# Patient Record
Sex: Male | Born: 1960 | Hispanic: No | Smoking: Former smoker
Health system: Southern US, Community
[De-identification: ages and names within clinical notes are randomized; demographics above are authoritative.]

## PROBLEM LIST (undated history)

## (undated) DIAGNOSIS — M109 Gout, unspecified: Secondary | ICD-10-CM

## (undated) DIAGNOSIS — I10 Essential (primary) hypertension: Secondary | ICD-10-CM

## (undated) DIAGNOSIS — F329 Major depressive disorder, single episode, unspecified: Secondary | ICD-10-CM

## (undated) DIAGNOSIS — C801 Malignant (primary) neoplasm, unspecified: Secondary | ICD-10-CM

## (undated) DIAGNOSIS — J449 Chronic obstructive pulmonary disease, unspecified: Secondary | ICD-10-CM

## (undated) DIAGNOSIS — E785 Hyperlipidemia, unspecified: Secondary | ICD-10-CM

## (undated) DIAGNOSIS — E538 Deficiency of other specified B group vitamins: Secondary | ICD-10-CM

## (undated) DIAGNOSIS — J45909 Unspecified asthma, uncomplicated: Secondary | ICD-10-CM

## (undated) DIAGNOSIS — D509 Iron deficiency anemia, unspecified: Secondary | ICD-10-CM

## (undated) DIAGNOSIS — F32A Depression, unspecified: Secondary | ICD-10-CM

## (undated) DIAGNOSIS — G2581 Restless legs syndrome: Secondary | ICD-10-CM

## (undated) DIAGNOSIS — R0600 Dyspnea, unspecified: Secondary | ICD-10-CM

## (undated) DIAGNOSIS — D649 Anemia, unspecified: Secondary | ICD-10-CM

## (undated) DIAGNOSIS — E1140 Type 2 diabetes mellitus with diabetic neuropathy, unspecified: Secondary | ICD-10-CM

## (undated) DIAGNOSIS — M1990 Unspecified osteoarthritis, unspecified site: Secondary | ICD-10-CM

## (undated) DIAGNOSIS — I998 Other disorder of circulatory system: Secondary | ICD-10-CM

## (undated) HISTORY — PX: NOSE SURGERY: SHX723

## (undated) HISTORY — PX: ANKLE FRACTURE SURGERY: SHX122

## (undated) HISTORY — DX: Restless legs syndrome: G25.81

## (undated) HISTORY — DX: Chronic obstructive pulmonary disease, unspecified: J44.9

## (undated) HISTORY — DX: Deficiency of other specified B group vitamins: E53.8

## (undated) HISTORY — DX: Gout, unspecified: M10.9

## (undated) HISTORY — DX: Major depressive disorder, single episode, unspecified: F32.9

## (undated) HISTORY — DX: Hyperlipidemia, unspecified: E78.5

## (undated) HISTORY — PX: KNEE SURGERY: SHX244

## (undated) HISTORY — DX: Unspecified osteoarthritis, unspecified site: M19.90

## (undated) HISTORY — DX: Depression, unspecified: F32.A

## (undated) HISTORY — DX: Type 2 diabetes mellitus with diabetic neuropathy, unspecified: E11.40

## (undated) HISTORY — DX: Other disorder of circulatory system: I99.8

## (undated) HISTORY — PX: COLONOSCOPY: SHX174

## (undated) HISTORY — PX: UPPER GASTROINTESTINAL ENDOSCOPY: SHX188

## (undated) HISTORY — DX: Essential (primary) hypertension: I10

## (undated) HISTORY — DX: Iron deficiency anemia, unspecified: D50.9

## (undated) HISTORY — DX: Unspecified asthma, uncomplicated: J45.909

---

## 2005-09-24 HISTORY — PX: TONGUE SURGERY: SHX810

## 2006-09-24 HISTORY — PX: CIRCUMCISION: SUR203

## 2010-02-14 ENCOUNTER — Encounter: Payer: Self-pay | Admitting: Cardiovascular Disease

## 2010-02-17 ENCOUNTER — Encounter: Payer: Self-pay | Admitting: Cardiovascular Disease

## 2010-03-03 ENCOUNTER — Ambulatory Visit: Payer: Self-pay | Admitting: Cardiovascular Disease

## 2010-03-03 DIAGNOSIS — I10 Essential (primary) hypertension: Secondary | ICD-10-CM

## 2010-03-03 DIAGNOSIS — R0602 Shortness of breath: Secondary | ICD-10-CM | POA: Insufficient documentation

## 2010-03-03 DIAGNOSIS — I498 Other specified cardiac arrhythmias: Secondary | ICD-10-CM

## 2010-03-21 ENCOUNTER — Encounter: Payer: Self-pay | Admitting: Cardiovascular Disease

## 2010-04-06 ENCOUNTER — Telehealth (INDEPENDENT_AMBULATORY_CARE_PROVIDER_SITE_OTHER): Payer: Self-pay | Admitting: *Deleted

## 2010-10-24 NOTE — Progress Notes (Signed)
  Phone Note Outgoing Call   Call placed by: Scherrie Bateman, LPN,  April 06, 2010 9:21 AM Call placed to: Patient Action Taken: Appt scheduled Summary of Call: LEFT MESSAGE PT NEEDS STRESS ECHO RESCHEDULED IF WILLING TO HAVE DONE PREVIOUS CANCELLED. Initial call taken by: Scherrie Bateman, LPN,  April 06, 2010 9:22 AM  Follow-up for Phone Call        SPOKE WITH PT STATES DID NOT THINK TEST WAS ALL THAT NECESSARY WILL CALL BACK IF DECIDES TO RESCHEDULE. Follow-up by: Scherrie Bateman, LPN,  April 06, 2010 9:47 AM

## 2010-10-24 NOTE — Letter (Signed)
Summary: [facility] [facility]   [employer] [facility]   Imported By: Roderic Ovens 04/27/2010 15:20:56  _____________________________________________________________________  External Attachment:    Type:   Image     Comment:   External Document

## 2010-10-24 NOTE — Consult Note (Signed)
Summary: [facility]   Imported By: Marylou Mccoy 03/02/2010 16:32:24  _____________________________________________________________________  External Attachment:    Type:   Image     Comment:   External Document

## 2010-10-24 NOTE — Consult Note (Signed)
Summary: [facility]   Imported By: Marylou Mccoy 03/02/2010 16:19:39  _____________________________________________________________________  External Attachment:    Type:   Image     Comment:   External Document

## 2010-10-24 NOTE — Assessment & Plan Note (Signed)
Summary: NP3/HTN/DM/JML   CC:  new patient with HTN and DM. Marland Kitchen  History of Present Illness: Robert Beck is seen at the request of Dr Timothy Lasso for multiple CRF's dyspnea and tachycardia.  He has been an insulin dependant diabetic for about 6 years.  He had a normal stress test in [place] about 5 years ago.  He denies SSCP but does get exertional dyspnea at times.  He is compliant with his meds and is on an ARB for renal protection and HTN.  He is not on a stain and I dont have an LDL on him  I reviewed ECG from 02/14/10 which was normal.  HR in our office after being roomed was 100bmp.  He was unaware of relatively high rate. Had bad chest pain about a month ago which likely was indigestion.  Has not recurred.  Current Problems (verified): 1)  Shortness of Breath  (ICD-786.05)  Current Medications (verified): 1)  Benicar Hct 40-12.5 Mg Tabs (Olmesartan Medoxomil-Hctz) .... Take One Tablet Once Daily 2)  Glyburide-Metformin 5-500 Mg Tabs (Glyburide-Metformin) .... Take One Tablet Three Times A Day 3)  Levemir Flexpen 100 Unit/ml Soln (Insulin Detemir) .... 20 Units Once Daily 4)  Fish Oil 1000 Mg Caps (Omega-3 Fatty Acids) .... Once Capsule Two Times A Day 5)  Multivitamins  Tabs (Multiple Vitamin) .... Take One Tablet Once Daily 6)  Slow Release Iron 47.5 Mg Cr-Tabs (Ferrous Sulfate) .... As Needed 7)  Aspirin 81 Mg Tabs (Aspirin) .... Take One Tablet Once Daily 8)  Ropinirole Hcl 1 Mg Tabs (Ropinirole Hcl) .... Take One Tablet At Bedtime  Allergies (verified): No Known Drug Allergies  Past History:  Past Medical History: Last updated: 03/21/10 HTN DM RLS Asthma Osteoarthritis  Past Surgical History: Last updated: 21-Mar-2010  Exploration and excisional biopsy of left thumb mass.   Growth Cut off tongue  Kneesurgery for torn Meniscus-Dr.Winkstrom  Nose  Family History: Last updated: 2010-03-21 Mother died leukemia  Dad died 12 of CAD  Social History: Last updated:  03/21/2010 Self Emplyed [employer] Single  Tobacco Use - Former.  Alcohol Use - yes  Review of Systems       Denies fever, malais, weight loss, blurry vision, decreased visual acuity, cough, sputum, , hemoptysis, pleuritic pain, palpitaitons, heartburn, abdominal pain, melena, lower extremity edema, claudication, or rash.   Vital Signs:  Patient profile:   50 year old male Height:      65 inches Weight:      187 pounds BMI:     31.23 Pulse rate:   94 / minute Pulse rhythm:   regular BP sitting:   118 / 90  (left arm) Cuff size:   large  Vitals Entered By: Judithe Modest CMA (March 03, 2010 11:40 AM)  Physical Exam  General:  Affect appropriate Healthy:  appears stated age HEENT: normal Neck supple with no adenopathy JVP normal no bruits no thyromegaly Lungs clear with no wheezing and good diaphragmatic motion Heart:  S1/S2 no murmur,rub, gallop or click PMI normal Abdomen: benighn, BS positve, no tenderness, no AAA no bruit.  No HSM or HJR Distal pulses intact with no bruits No edema Neuro non-focal Skin warm and dry    Impression & Recommendations:  Problem # 1:  SHORTNESS OF BREATH (ICD-786.05) Functional with normal exam  Stess echo to R/O anginal equivalent given multiple risk factors His updated medication list for this problem includes:    Benicar Hct 40-12.5 Mg Tabs (Olmesartan medoxomil-hctz) .Marland Kitchen... Take one tablet once  daily    Aspirin 81 Mg Tabs (Aspirin) .Marland Kitchen... Take one tablet once daily  Orders: Stress Echo (Stress Echo)  Problem # 2:  ESSENTIAL HYPERTENSION, BENIGN (ICD-401.1) Well controlled with see hemodynamic response to exercise duriing stress echo His updated medication list for this problem includes:    Benicar Hct 40-12.5 Mg Tabs (Olmesartan medoxomil-hctz) .Marland Kitchen... Take one tablet once daily    Aspirin 81 Mg Tabs (Aspirin) .Marland Kitchen... Take one tablet once daily  Problem # 3:  FAMILY HISTORY OF ISCHEMIC HEART DISEASE (ICD-V17.3) Especially  with poorly controlled DM will need stress testing q 3 years.  Consdier calcium score and F/U lipid profile to see if statin should be prescribed  Problem # 4:  SINUS TACHYCARDIA (ICD-427.89) May be anxiety or autonomic dysfunction.  EF will be seen with stress echo and will make sure HR response is normal His updated medication list for this problem includes:    Aspirin 81 Mg Tabs (Aspirin) .Marland Kitchen... Take one tablet once daily  Patient Instructions: 1)  Your physician recommends that you schedule a follow-up appointment in: AS NEEDED 2)  Your physician recommends that you continue on your current medications as directed. Please refer to the Current Medication list given to you today. 3)  Your physician has requested that you have a stress echocardiogram. For further information please visit [email or web address]  Please follow instruction sheet as given.   EKG Report  Procedure date:  02/14/2010  Findings:      NSR  Normal ECG

## 2011-02-15 ENCOUNTER — Telehealth: Payer: Self-pay | Admitting: Cardiovascular Disease

## 2011-02-15 NOTE — Telephone Encounter (Signed)
Called patient's wife. She wanted to know what tests he had done last year when he saw Dr.Nishan. I advised her that he had an EKG completed.

## 2011-02-15 NOTE — Telephone Encounter (Signed)
Attempted to call pt. X 2  Phone sounds busy.

## 2011-02-15 NOTE — Telephone Encounter (Signed)
Pt was in 03-03-10 to see dr Eden Emms and wants to know what was done during that visit

## 2011-02-16 ENCOUNTER — Telehealth: Payer: Self-pay | Admitting: Cardiovascular Disease

## 2011-02-16 NOTE — Telephone Encounter (Signed)
Patient last saw Dr. Eden Emms in 02/2010. Stress echo scheduled and cancelled per pt. He will need to set up an appointment to see Dr. Eden Emms.

## 2011-02-16 NOTE — Telephone Encounter (Signed)
Pt need to be set up stress ultrasound.

## 2011-02-21 NOTE — Telephone Encounter (Signed)
Pt wants to schedule an stress echo. Please put in a order for the stress echo. If you would like once the orders in i would be able to call pt and set the appt up for the pt.

## 2011-02-23 ENCOUNTER — Other Ambulatory Visit: Payer: Self-pay | Admitting: *Deleted

## 2011-02-23 NOTE — Telephone Encounter (Signed)
PT'S WIFE AWARE OKAY FOR PT TO HAVE  STRESS ECHO DONE PER DR NISHAN WILL FORWARD TO SCHEDULERS TO SET UP .Robert Beck

## 2011-02-23 NOTE — Telephone Encounter (Signed)
Pt rtn your call you can rtn call at [phone]

## 2011-02-27 ENCOUNTER — Encounter: Payer: Self-pay | Admitting: Cardiovascular Disease

## 2011-02-27 ENCOUNTER — Encounter: Payer: Self-pay | Admitting: *Deleted

## 2011-02-27 ENCOUNTER — Telehealth: Payer: Self-pay | Admitting: *Deleted

## 2011-02-27 NOTE — Telephone Encounter (Signed)
ERROR

## 2011-03-07 ENCOUNTER — Ambulatory Visit (HOSPITAL_BASED_OUTPATIENT_CLINIC_OR_DEPARTMENT_OTHER): Payer: BC Managed Care – PPO | Admitting: Radiology

## 2011-03-07 ENCOUNTER — Ambulatory Visit (HOSPITAL_COMMUNITY): Payer: BC Managed Care – PPO | Attending: Cardiovascular Disease | Admitting: Radiology

## 2011-03-07 ENCOUNTER — Telehealth: Payer: Self-pay | Admitting: Cardiovascular Disease

## 2011-03-07 DIAGNOSIS — R072 Precordial pain: Secondary | ICD-10-CM

## 2011-03-07 DIAGNOSIS — R0989 Other specified symptoms and signs involving the circulatory and respiratory systems: Secondary | ICD-10-CM

## 2011-03-07 DIAGNOSIS — R079 Chest pain, unspecified: Secondary | ICD-10-CM | POA: Insufficient documentation

## 2011-03-07 NOTE — Telephone Encounter (Signed)
Spoke with Britta Mccreedy, she is aware stress test is normal. Deliah Goody

## 2011-03-07 NOTE — Telephone Encounter (Signed)
Pt had stress echo done today c/o chestpain , sob,

## 2014-03-23 ENCOUNTER — Encounter: Payer: Self-pay | Admitting: Cardiovascular Disease

## 2016-01-17 ENCOUNTER — Other Ambulatory Visit: Payer: Self-pay | Admitting: Internal Medicine

## 2016-01-17 DIAGNOSIS — R109 Unspecified abdominal pain: Secondary | ICD-10-CM

## 2016-01-17 DIAGNOSIS — R112 Nausea with vomiting, unspecified: Secondary | ICD-10-CM

## 2016-02-27 ENCOUNTER — Telehealth: Payer: Self-pay | Admitting: Gastroenterology

## 2016-02-27 NOTE — Telephone Encounter (Signed)
Patient scheduled for 03/01/16 with Nicoletta Ba PA Left message for patient to call back

## 2016-02-27 NOTE — Telephone Encounter (Signed)
Dr. Virgina Jock called about a patient who has never been seen here;, new significant IDA Hb 9.7 (was 13-15).  Heme positive. No overt GI bleeding. No NSAIDs except bASA which as been stopped.  Omep 20mg  daily started.  Can you put her in for NGI OV this week, looks like AE has times on Thursday.

## 2016-02-28 NOTE — Telephone Encounter (Signed)
I left a detailed message for the patient that we have scheduled her for an office visit for Thursday at 11:00 with [facility] PA.  I asked that she call back and confirm she got me message and let me know if she can keep appt

## 2016-02-28 NOTE — Telephone Encounter (Signed)
Patient rescheduled to Friday at 2:30 with Alonza Bogus, PA.  She can't come on Thursday

## 2016-03-01 ENCOUNTER — Ambulatory Visit: Payer: Self-pay | Admitting: Physician Assistant

## 2016-03-02 ENCOUNTER — Ambulatory Visit (INDEPENDENT_AMBULATORY_CARE_PROVIDER_SITE_OTHER): Payer: BLUE CROSS/BLUE SHIELD | Admitting: Gastroenterology

## 2016-03-02 ENCOUNTER — Encounter: Payer: Self-pay | Admitting: Gastroenterology

## 2016-03-02 VITALS — BP 118/66 | HR 84 | Ht 66.0 in | Wt 165.0 lb

## 2016-03-02 DIAGNOSIS — Z1211 Encounter for screening for malignant neoplasm of colon: Secondary | ICD-10-CM | POA: Insufficient documentation

## 2016-03-02 DIAGNOSIS — IMO0001 Reserved for inherently not codable concepts without codable children: Secondary | ICD-10-CM

## 2016-03-02 DIAGNOSIS — R634 Abnormal weight loss: Secondary | ICD-10-CM | POA: Insufficient documentation

## 2016-03-02 DIAGNOSIS — Z794 Long term (current) use of insulin: Secondary | ICD-10-CM

## 2016-03-02 DIAGNOSIS — R195 Other fecal abnormalities: Secondary | ICD-10-CM | POA: Insufficient documentation

## 2016-03-02 DIAGNOSIS — E119 Type 2 diabetes mellitus without complications: Secondary | ICD-10-CM

## 2016-03-02 DIAGNOSIS — D509 Iron deficiency anemia, unspecified: Secondary | ICD-10-CM

## 2016-03-02 MED ORDER — NA SULFATE-K SULFATE-MG SULF 17.5-3.13-1.6 GM/177ML PO SOLN: 1.0000 | Freq: Once | ORAL | Status: AC

## 2016-03-02 NOTE — Progress Notes (Signed)
03/02/2016 Robert Beck [id number] [birth year]   HISTORY OF PRESENT ILLNESS:  This is a 55 year old white male who is new to our office and was referred by his PCP, Dr. Virgina Jock for new iron deficiency anemia. His most recent hemoglobin was 10.4 g as compared to 13-15 g that was reportedly drawn several months ago. Iron levels are also low with a ferritin of 6, serum iron of 17, iron percent saturation 4.  He was also heme positive.  He denies any red blood in his stool, but says that they have been black and tarry on occasion, the last occurring a week or 2 ago. He denies NSAID use. He was on an aspirin, but that was recently discontinued by his PCP in light of these issues.  He admits to having extreme fatigue that has been going on for a while. Reports weight loss of possibly up to 30 pounds over several months.  Reports some nausea, but no vomiting. Recently started taking iron supplement once daily.  Past Medical History  Diagnosis Date  . Hypertension   . Type 2 diabetes, controlled, with neuropathy ([place])   . Asthma   . Hyperlipidemia   . RLS (restless legs syndrome)   . Gout   . Iron deficiency anemia   . Ischemia   . Depression   . Vitamin B12 deficiency   . Osteoarthritis    Past Surgical History  Procedure Laterality Date  . Knee surgery    . Ankle fracture surgery    . Nose surgery    . Circumcision  2008    reports that he has quit smoking. He does not have any smokeless tobacco history on file. He reports that he drinks alcohol. He reports that he does not use illicit drugs. family history includes Breast cancer in his mother; Heart disease in his father; Leukemia in his mother. No Known Allergies    Outpatient Encounter Prescriptions as of 03/02/2016  Medication Sig  . allopurinol (ZYLOPRIM) 300 MG tablet Take 300 mg by mouth daily.  . Ascorbic Acid (VITAMIN C) 100 MG tablet Take 100 mg by mouth daily.  . canagliflozin (INVOKANA) 100 MG TABS tablet Take 100 mg by  mouth daily before breakfast.  . colchicine 0.6 MG tablet Take 0.6 mg by mouth daily.  Marland Kitchen doxycycline (VIBRAMYCIN) 100 MG capsule Take 100 mg by mouth 2 (two) times daily.  . Ferrous Sulfate (SLOW RELEASE IRON) 47.5 MG TBCR Take 1 tablet by mouth daily as needed.  . Fluticasone-Salmeterol (ADVAIR) 100-50 MCG/DOSE AEPB Inhale 1 puff into the lungs 2 (two) times daily.  Marland Kitchen HYDROcodone-acetaminophen (NORCO/VICODIN) 5-325 MG tablet Take 1 tablet by mouth 2 (two) times daily as needed for moderate pain.  Marland Kitchen insulin NPH-regular Human (HUMULIN 70/30) (70-30) 100 UNIT/ML injection Inject 40 Units into the skin daily.  Marland Kitchen levalbuterol (XOPENEX HFA) 45 MCG/ACT inhaler Inhale 2 puffs into the lungs 3 (three) times daily as needed for wheezing.  . metFORMIN (GLUCOPHAGE) 500 MG tablet Take 500 mg by mouth 3 (three) times daily.  . Multiple Vitamin (MULTIVITAMIN) tablet Take 1 tablet by mouth daily.  Marland Kitchen olmesartan-hydrochlorothiazide (BENICAR HCT) 40-12.5 MG tablet Take 1 tablet by mouth daily.  . Omega-3 Fatty Acids (FISH OIL) 1000 MG CAPS Take 1 capsule by mouth daily.  Marland Kitchen rOPINIRole (REQUIP) 1 MG tablet Take 1 mg by mouth 3 (three) times daily.  . simvastatin (ZOCOR) 20 MG tablet Take 20 mg by mouth daily.  . Na Sulfate-K Sulfate-Mg Sulf 17.5-3.13-1.6  GM/180ML SOLN Take 1 kit by mouth once.   No facility-administered encounter medications on file as of 03/02/2016.     REVIEW OF SYSTEMS  : All other systems reviewed and negative except where noted in the History of Present Illness.   PHYSICAL EXAM: BP 118/66 mmHg  Pulse 84  Ht _0  (1.676 m)  Wt 165 lb (74.844 kg)  BMI 26.64 kg/m2 General: Well developed white male in no acute distress Head: Normocephalic and atraumatic Eyes:  Sclerae anicteric, conjunctiva pink. Ears: Normal auditory acuity Lungs: Clear throughout to auscultation Heart: Regular rate and rhythm Abdomen: Soft, non-distended.  Normal bowel sounds.  Non-tender. Rectal:  Will be done at  the time of colonoscopy. Musculoskeletal: Symmetrical with no gross deformities  Skin: No lesions on visible extremities Extremities: No edema  Neurological: Alert oriented x 4, grossly non-focal Psychological:  Alert and cooperative. Normal mood and affect  ASSESSMENT AND PLAN: -55 year old male with new iron deficiency anemia:  Hemoglobin 10.4 g compared to 13-15 g several months ago. Iron studies low. Heme positive.  Weight loss of approximately 30 pounds over several months.  He was recently started on daily iron supplement, but may need to consider iron infusion and/or increased doses of PO iron. -Screening for CRC -IDDM:  Insulin will be adjusted prior to endoscopic procedure per protocol. Will resume normal dosing after procedure.  *Patient will be scheduled for EGD and colonoscopy with Dr. Silverio Decamp.  The risks, benefits, and alternatives were discussed with the patient and he consents to proceed.    CC:  No ref. provider found

## 2016-03-02 NOTE — Patient Instructions (Signed)
You have been scheduled for a colonoscopy. Please follow written instructions given to you at your visit today.  Please pick up your prep supplies at the pharmacy within the next 1-3 days. [employer], [employer] If you use inhalers (even only as needed), please bring them with you on the day of your procedure. Your physician has requested that you go to www.startemmi.com and enter the access code given to you at your visit today. This web site gives a general overview about your procedure. However, you should still follow specific instructions given to you by our office regarding your preparation for the procedure.

## 2016-03-05 NOTE — Progress Notes (Signed)
Reviewed and agree with documentation and assessment and plan. [name] , MD   

## 2016-03-30 ENCOUNTER — Encounter: Payer: Self-pay | Admitting: Gastroenterology

## 2016-04-04 ENCOUNTER — Telehealth: Payer: Self-pay | Admitting: Gastroenterology

## 2016-04-04 NOTE — Telephone Encounter (Signed)
Informed him he can take his pain medication prior to 11:00 am the morning of the procedure 7-13.  Patient verbalized understanding the instructions.

## 2016-04-05 ENCOUNTER — Ambulatory Visit (AMBULATORY_SURGERY_CENTER): Payer: BLUE CROSS/BLUE SHIELD | Admitting: Gastroenterology

## 2016-04-05 ENCOUNTER — Other Ambulatory Visit: Payer: Self-pay

## 2016-04-05 ENCOUNTER — Other Ambulatory Visit (INDEPENDENT_AMBULATORY_CARE_PROVIDER_SITE_OTHER): Payer: BLUE CROSS/BLUE SHIELD

## 2016-04-05 ENCOUNTER — Encounter: Payer: Self-pay | Admitting: Gastroenterology

## 2016-04-05 VITALS — BP 140/89 | HR 88 | Temp 98.7°F | Resp 13 | Ht 66.0 in | Wt 165.0 lb

## 2016-04-05 DIAGNOSIS — D509 Iron deficiency anemia, unspecified: Secondary | ICD-10-CM

## 2016-04-05 DIAGNOSIS — Z1211 Encounter for screening for malignant neoplasm of colon: Secondary | ICD-10-CM | POA: Diagnosis present

## 2016-04-05 DIAGNOSIS — R1907 Generalized intra-abdominal and pelvic swelling, mass and lump: Secondary | ICD-10-CM

## 2016-04-05 DIAGNOSIS — D131 Benign neoplasm of stomach: Secondary | ICD-10-CM

## 2016-04-05 DIAGNOSIS — Z01812 Encounter for preprocedural laboratory examination: Secondary | ICD-10-CM

## 2016-04-05 MED ORDER — SODIUM CHLORIDE 0.9 % IV SOLN: 500.0000 mL | INTRAVENOUS | Status: DC

## 2016-04-05 MED ORDER — SUCRALFATE 1 GM/10ML PO SUSP: 1.0000 g | Freq: Four times a day (QID) | ORAL | Status: DC

## 2016-04-05 MED ORDER — PANTOPRAZOLE SODIUM 40 MG PO TBEC: 40.0000 mg | DELAYED_RELEASE_TABLET | Freq: Two times a day (BID) | ORAL | Status: DC

## 2016-04-05 NOTE — Progress Notes (Signed)
Report to PACU, RN, vss, BBS= Clear.  

## 2016-04-05 NOTE — Patient Instructions (Signed)
YOU HAD AN ENDOSCOPIC PROCEDURE TODAY AT [facility]:   Refer to the procedure report that was given to you for any specific questions about what was found during the examination.  If the procedure report does not answer your questions, please call your gastroenterologist to clarify.  If you requested that your care partner not be given the details of your procedure findings, then the procedure report has been included in a sealed envelope for you to review at your convenience later.  YOU SHOULD EXPECT: Some feelings of bloating in the abdomen. Passage of more gas than usual.  Walking can help get rid of the air that was put into your GI tract during the procedure and reduce the bloating. If you had a lower endoscopy (such as a colonoscopy or flexible sigmoidoscopy) you may notice spotting of blood in your stool or on the toilet paper. If you underwent a bowel prep for your procedure, you may not have a normal bowel movement for a few days.  Please Note:  You might notice some irritation and congestion in your nose or some drainage.  This is from the oxygen used during your procedure.  There is no need for concern and it should clear up in a day or so.  SYMPTOMS TO REPORT IMMEDIATELY:   Following lower endoscopy (colonoscopy or flexible sigmoidoscopy):  Excessive amounts of blood in the stool  Significant tenderness or worsening of abdominal pains  Swelling of the abdomen that is new, acute  Fever of 100F or higher   Following upper endoscopy (EGD)  Vomiting of blood or coffee ground material  New chest pain or pain under the shoulder blades  Painful or persistently difficult swallowing  New shortness of breath  Fever of 100F or higher  Black, tarry-looking stools  For urgent or emergent issues, a gastroenterologist can be reached at any hour by calling [phone].   DIET: You will be on a mechanical soft diet until further notice.  Likewise, meals heavy in dairy and  vegetables can increase bloating.  Drink plenty of fluids but you should avoid alcoholic beverages for 24 hours.  ACTIVITY:  You should plan to take it easy for the rest of today and you should NOT DRIVE or use heavy machinery until tomorrow (because of the sedation medicines used during the test).    FOLLOW UP: Our staff will call the number listed on your records the next business day following your procedure to check on you and address any questions or concerns that you may have regarding the information given to you following your procedure. If we do not reach you, we will leave a message.  However, if you are feeling well and you are not experiencing any problems, there is no need to return our call.  We will assume that you have returned to your regular daily activities without incident.  If any biopsies were taken you will be contacted by phone or by letter within the next 1-3 weeks.  Please call us at [phone] if you have not heard about the biopsies in 3 weeks.    SIGNATURES/CONFIDENTIALITY: You and/or your care partner have signed paperwork which will be entered into your electronic medical record.  These signatures attest to the fact that that the information above on your After Visit Summary has been reviewed and is understood.  Full responsibility of the confidentiality of this discharge information lies with you and/or your care-partner.  You CT scan is Monday 04/09/16 at  [facility] on [address].  Be there at 8:45am.  You will have to go to the lab for bloodwork today.  Do not use any NSAIDS nor aspirin until further notice. You may use your pain medication as ordered.  Use your protonix as ordered and your carafate.

## 2016-04-05 NOTE — Op Note (Signed)
[name]: Robert Beck Procedure Date: 04/05/2016 2:10 PM MRN: [id number] Endoscopist: Mauri Pole , MD Age: 55 Referring MD:  Date of Birth: [birth year] Gender: Male Account #: [id number] Procedure:                Colonoscopy Indications:              Incidental - Evaluation of unexplained GI bleeding,                            Incidental - Unexplained iron deficiency anemia,                            Screening for colorectal malignant neoplasm Medicines:                Monitored Anesthesia Care Procedure:                Pre-Anesthesia Assessment:                           - Prior to the procedure, a History and Physical                            was performed, and patient medications and                            allergies were reviewed. The patient's tolerance of                            previous anesthesia was also reviewed. The risks                            and benefits of the procedure and the sedation                            options and risks were discussed with the patient.                            All questions were answered, and informed consent                            was obtained. Prior Anticoagulants: The patient has                            taken no previous anticoagulant or antiplatelet                            agents. ASA Grade Assessment: II - A patient with                            mild systemic disease. After reviewing the risks                            and benefits, the patient was deemed in  satisfactory condition to undergo the procedure.                           - Prior to the procedure, a History and Physical                            was performed, and patient medications and                            allergies were reviewed. The patient's tolerance of                            previous anesthesia was also reviewed. The risks                            and benefits of the  procedure and the sedation                            options and risks were discussed with the patient.                            All questions were answered, and informed consent                            was obtained. Prior Anticoagulants: The patient has                            taken no previous anticoagulant or antiplatelet                            agents. ASA Grade Assessment: II - A patient with                            mild systemic disease. After reviewing the risks                            and benefits, the patient was deemed in                            satisfactory condition to undergo the procedure.                           After obtaining informed consent, the colonoscope                            was passed under direct vision. Throughout the                            procedure, the patient's blood pressure, pulse, and                            oxygen saturations were monitored continuously. The  Model CF-HQ190L [id number]) scope was introduced                            through the anus and advanced to the the terminal                            ileum, with identification of the appendiceal                            orifice and IC valve. The colonoscopy was performed                            without difficulty. The patient tolerated the                            procedure well. The quality of the bowel                            preparation was good. The ileocecal valve,                            appendiceal orifice, and rectum were photographed. Scope In: 2:25:43 PM Scope Out: 2:37:00 PM Scope Withdrawal Time: 0 hours 7 minutes 31 seconds  Total Procedure Duration: 0 hours 11 minutes 17 seconds  Findings:                 The perianal and digital rectal examinations were                            normal.                           Multiple small and large-mouthed diverticula were                            found in the entire  colon.                           Non-bleeding internal hemorrhoids were found during                            retroflexion. The hemorrhoids were small.                           The exam was otherwise without abnormality. Complications:            No immediate complications. Estimated Blood Loss:     Estimated blood loss: none. Impression:               - Diverticulosis in the entire examined colon.                           - Non-bleeding internal hemorrhoids.                           - The examination was otherwise normal.                           -  No specimens collected. Recommendation:           - Patient has a contact number available for                            emergencies. The signs and symptoms of potential                            delayed complications were discussed with the                            patient. Return to normal activities tomorrow.                            Written discharge instructions were provided to the                            patient.                           - Mechanical soft diet.                           - Continue present medications.                           - Repeat colonoscopy in 10 years for screening                            purposes.                           - Return to GI clinic PRN. Mauri Pole, MD 04/05/2016 2:48:25 PM This report has been signed electronically.

## 2016-04-05 NOTE — Progress Notes (Signed)
Called to room to assist during endoscopic procedure.  Patient ID and intended procedure confirmed with present staff. Received instructions for my participation in the procedure from the performing physician.  

## 2016-04-05 NOTE — Op Note (Signed)
[place] Patient Name: Robert Beck Procedure Date: 04/05/2016 2:06 PM MRN: [id number] Endoscopist: Mauri Pole , MD Age: 55 Referring MD:  Date of Birth: 26-Jan-1961 Gender: Male Account #: [id number] Procedure:                Upper GI endoscopy Indications:              Recent gastrointestinal bleeding, Gastrointestinal                            bleeding of unknown origin, New-onset upper                            abdominal symptoms in patient older than 34 years,                            Upper abdominal symptoms associated with anorexia                            (suggesting structural disease) Medicines:                Monitored Anesthesia Care Procedure:                Pre-Anesthesia Assessment:                           - Prior to the procedure, a History and Physical                            was performed, and patient medications and                            allergies were reviewed. The patient's tolerance of                            previous anesthesia was also reviewed. The risks                            and benefits of the procedure and the sedation                            options and risks were discussed with the patient.                            All questions were answered, and informed consent                            was obtained. Prior Anticoagulants: The patient has                            taken no previous anticoagulant or antiplatelet                            agents. ASA Grade Assessment: II - A patient with  mild systemic disease. After reviewing the risks                            and benefits, the patient was deemed in                            satisfactory condition to undergo the procedure.                           After obtaining informed consent, the endoscope was                            passed under direct vision. Throughout the                            procedure, the patient's  blood pressure, pulse, and                            oxygen saturations were monitored continuously. The                            Model GIF-HQ190 [id number]) scope was introduced                            through the mouth, and advanced to the second part                            of duodenum. The upper GI endoscopy was                            accomplished without difficulty. The patient                            tolerated the procedure well. Scope In: Scope Out: Findings:                 The Z-line was irregular and was found 40 cm from                            the incisors.                           A medium-sized, frond-like/villous and ulcerated,                            partially circumferential mass with oozing bleeding                            and stigmata of recent bleeding was found at the                            gastroesophageal junction and in the cardia.                            Biopsies were taken with a cold  forceps for                            histology.                           The examined duodenum was normal.                           The esophagus was normal. Complications:            No immediate complications. Estimated Blood Loss:     Estimated blood loss: Minimal. Impression:               - Z-line irregular, 40 cm from the incisors.                           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                            Biopsied.                           - Normal examined duodenum.                           - Normal esophagus. Recommendation:           - No aspirin, ibuprofen, naproxen, or other                            non-steroidal anti-inflammatory drugs.                           - Mechanical soft diet.                           - Continue present medications.                           -Pronix 40mg  BID                           -Carafate 1 gm before meals  and bedtime                           - Await pathology results. (Samples were rushed)                           -CT chest, abdomen and pelvis with contrast Mauri Pole, MD 04/05/2016 2:45:49 PM This report has been signed electronically.

## 2016-04-05 NOTE — Progress Notes (Signed)
Patient arrived in recovery hurting.  Gas passed and patient was still hurting and crying. Stated pain #6; fentanyl 48mcg given IV per verbal order of Dr. Silverio Decamp.  Patient felt much better 10 minutes after administration.    Patient to have CT scan on 7/17/ 17 at 8:445 am.  No contrast given due to the fact that his Chest will be done first.  They will give him the contrast a the CT place.  Patient understands the instructions regarding his diet, etc..

## 2016-04-06 ENCOUNTER — Telehealth: Payer: Self-pay | Admitting: *Deleted

## 2016-04-06 ENCOUNTER — Telehealth: Payer: Self-pay | Admitting: Gastroenterology

## 2016-04-06 LAB — BUN: BUN: 7 mg/dL (ref 6–23)

## 2016-04-06 LAB — CREATININE, SERUM: Creatinine, Ser: 0.7 mg/dL (ref 0.40–1.50)

## 2016-04-06 NOTE — Telephone Encounter (Signed)
Pt. Care partner called back to make sure she understood prior message [phone] diet. Advised her to keep pt. On soft diet, and that he could have ground meat. He shoud avoid foods that require a lot of chewing, like vegetables, fruits and whole meat.

## 2016-04-06 NOTE — Telephone Encounter (Signed)
Please advise him to eat soft diet and avoid anything that needs extensive chewing like chunky fruit, vegetables or meat. Ground meat is ok.

## 2016-04-06 NOTE — Telephone Encounter (Signed)
Left pt. Message to stick with soft diet for dysphagia.  He may have ground meat.  He should avoid foods that require extensive chewing like vegetable, fruits, and meats.

## 2016-04-06 NOTE — Telephone Encounter (Signed)
  Follow up Call-  Call back number 04/05/2016  Post procedure Call Back phone  # [phone]  Permission to leave phone message Yes     Patient questions:  Do you have a fever, pain , or abdominal swelling? No. Pain Score  0 *  Have you tolerated food without any problems? No.  Have you been able to return to your normal activities? Yes.    Do you have any questions about your discharge instructions: Diet   No. Medications  No. Follow up visit  No.  Do you have questions or concerns about your Care? No.  Actions: * If pain score is 4 or above:pt. Tried eating cornbread an milk mixed together last night but could not complete because of dyphagia.  He is able to eat dumplings and mashed potatoes.  Advise caregiver to have him call if difficulty swallowing becomes worse, or if he has any other problems.  No action needed, pain <4.

## 2016-04-09 ENCOUNTER — Other Ambulatory Visit: Payer: Self-pay

## 2016-04-09 ENCOUNTER — Telehealth: Payer: Self-pay | Admitting: Gastroenterology

## 2016-04-09 ENCOUNTER — Ambulatory Visit (INDEPENDENT_AMBULATORY_CARE_PROVIDER_SITE_OTHER)
Admission: RE | Admit: 2016-04-09 | Discharge: 2016-04-09 | Disposition: A | Discharge status: Home / Self Care | Payer: BLUE CROSS/BLUE SHIELD | Source: Ambulatory Visit | Attending: Gastroenterology | Admitting: Gastroenterology

## 2016-04-09 DIAGNOSIS — R1907 Generalized intra-abdominal and pelvic swelling, mass and lump: Secondary | ICD-10-CM

## 2016-04-09 DIAGNOSIS — J9859 Other diseases of mediastinum, not elsewhere classified: Secondary | ICD-10-CM

## 2016-04-09 DIAGNOSIS — R590 Localized enlarged lymph nodes: Secondary | ICD-10-CM

## 2016-04-09 DIAGNOSIS — C155 Malignant neoplasm of lower third of esophagus: Secondary | ICD-10-CM

## 2016-04-09 DIAGNOSIS — R918 Other nonspecific abnormal finding of lung field: Secondary | ICD-10-CM

## 2016-04-09 DIAGNOSIS — K3189 Other diseases of stomach and duodenum: Secondary | ICD-10-CM

## 2016-04-09 MED ORDER — IOPAMIDOL (ISOVUE-300) INJECTION 61%
100.0000 mL | Freq: Once | INTRAVENOUS | Status: AC | PRN
  Administered 2016-04-09: 100 mL via INTRAVENOUS

## 2016-04-09 NOTE — Telephone Encounter (Signed)
Advised to fast for 4 hours prior to his scheduled procedure at 2:30 on 04/11/16. Patient lives in [place], [place]. It is difficult for him to come to [facility] to sign consent forms prior to the procedure. He is familiar with the procedure because he had this done last week.

## 2016-04-09 NOTE — Telephone Encounter (Signed)
Called back patient x 3 with no response. Left a message explaining results

## 2016-04-10 ENCOUNTER — Telehealth: Payer: Self-pay | Admitting: *Deleted

## 2016-04-10 NOTE — Telephone Encounter (Signed)
Oncology Nurse Navigator Documentation  Oncology Nurse Navigator Flowsheets 04/10/2016  Navigator Location [facility]  Navigator Encounter Type Introductory phone call  Abnormal Finding Date 04/05/2016  Spoke with patient's significant other, [name] and provided new patient appointment for 04/17/16 at 2 pm with Dr. Benay Spice. Informed of location of [facility], valet service, and registration process. Reminded to bring insurance cards and a current medication list, including supplements. Barbara verbalizes understanding. She reports that appointments need to be cleared with her since she is the one working and the driver frequently. They prefer to come to [facility] for care (she was treated by Dr. Truddie Coco for her breast cancer). Provided navigator direct # to call for any questions or change in appointment needed.

## 2016-04-11 ENCOUNTER — Encounter: Payer: Self-pay | Admitting: Gastroenterology

## 2016-04-11 ENCOUNTER — Ambulatory Visit (AMBULATORY_SURGERY_CENTER): Payer: BLUE CROSS/BLUE SHIELD | Admitting: Gastroenterology

## 2016-04-11 ENCOUNTER — Telehealth: Payer: Self-pay | Admitting: *Deleted

## 2016-04-11 VITALS — BP 159/100 | HR 94 | Temp 98.2°F | Resp 12 | Ht 66.0 in | Wt 165.0 lb

## 2016-04-11 DIAGNOSIS — K319 Disease of stomach and duodenum, unspecified: Secondary | ICD-10-CM

## 2016-04-11 MED ORDER — HYDROCODONE-ACETAMINOPHEN 5-325 MG PO TABS: 1.0000 | ORAL_TABLET | Freq: Four times a day (QID) | ORAL | Status: DC | PRN

## 2016-04-11 MED ORDER — DEXTROSE 5 % IV SOLN: INTRAVENOUS | Status: DC

## 2016-04-11 NOTE — Progress Notes (Signed)
Specimen was sent [facility] to pathology per Dr. Silverio Decamp. Corky Sing

## 2016-04-11 NOTE — Progress Notes (Signed)
Pt. Discharged with no complaints of pain.  Smiling, states he feels great.

## 2016-04-11 NOTE — Progress Notes (Addendum)
1520 pt. Care partner asks for pain medication for him.  States he is having mid lower abominal pain.  Dr. Silverio Decamp ordered Fentanyl 100 mcg IV. Pt and care partner stated that they wanted something stronger than Hydrocodone rx. Given by Dr. Silverio Decamp.  Will give Fentanyl per order.

## 2016-04-11 NOTE — Telephone Encounter (Signed)
Called his significant other, [name] and made her aware that he will be seeing Dr. Isidore Moos on 04/17/16 at 12:15/12:30 before seeing Dr. Benay Spice. Expressed appreciation for the care coordination.

## 2016-04-11 NOTE — Progress Notes (Signed)
GI Location of Tumor / Histology:  04/05/16 Diagnosis Esophagogastric junction, biopsy, GE junction mass - BENIGN GASTRIC TYPE MUCOSA WITH ASSOCIATED ULCERATION AND ACUTE INFLAMMATION.  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  Robert Beck presented 02/27/16 to his Primary MD with symptoms of: Chest pain that started in December and progressively got worse.   Biopsies of Esophagogastric junction, GE junction mass revealed: benign gastric type mucosa with associated ulceration and acute inflammation.   Past/Anticipated interventions by surgeon, if any:  04/11/16 Repeat Biopsies.   04/20/16 Ultrasound Biopsy,  Right neck mass.  Past/Anticipated interventions by medical oncology, if any:  He has an appointment with Dr. Benay Spice 04/17/16 at 2:00  Weight changes, if any: He reports a 45-50 lb weight loss since December.   Bowel/Bladder complaints, if any: He reports a weak urinary stream, and takes longer to void.  He does report darker stool.   Nausea / Vomiting, if any: He has nausea and vomiting. He reports he dry heaves that are worse in the morning about every 2-3 days. He says a month ago he had daily nausea/dry heaves.   Pain issues, if any:  He report central chest pain. He takes Hydrocodone in the morning and in the evening with some relief.   Any blood per rectum:  No   SAFETY ISSUES:  Prior radiation? No  Pacemaker/ICD? ?No  Possible current pregnancy? NA  Is the patient on methotrexate? No  Current Complaints/Details: 04/09/16 CT abdomen/ pelvis. CT chest IMPRESSION: Aortic atherosclerosis.  Coronary artery calcifications are noted suggesting Coronary artery disease.  4.5 x 3.7 cm mass is seen medial to gastric cardia which may extend into gastroesophageal junction and is highly concerning for malignancy. This correlates with reported findings from endoscopy.  Mediastinal and right  supraclavicular adenopathy is noted concerning for metastatic disease.  BP (!) 157/88   Pulse 93   Temp 98.6 F (37 C)   Ht 5\' 6"  (1.676 m)   Wt 156 lb 11.2 oz (71.1 kg)   SpO2 99% Comment: room air  BMI 25.29 kg/m     Wt Readings from Last 3 Encounters:  04/17/16 156 lb 11.2 oz (71.1 kg)  04/11/16 165 lb (74.8 kg)  04/05/16 165 lb (74.8 kg)

## 2016-04-11 NOTE — Progress Notes (Signed)
Pt stable to RR 

## 2016-04-11 NOTE — Progress Notes (Signed)
No egg or soy allergy known to patient  No issues with past sedation with any surgeries  or procedures, no intubation problems  No diet pills per patient No home 02 use per patient  No blood thinners per patient  Pt denies issues with constipation  Upon arrival pt clammy and pale, tachycardic - blood sugar 50.  Iv d5w started and 25cc D50 Iv per protocol. Pt states after D 50 feeling much better, color better . Pulse 90

## 2016-04-11 NOTE — Op Note (Signed)
[place] Patient Name: Robert Beck Procedure Date: 04/11/2016 2:35 PM MRN: [id number] Endoscopist: Mauri Pole , MD Age: 55 Referring MD:  Date of Birth: 1961-02-20 Gender: Male Account #: [id number] Procedure:                Upper GI endoscopy Indications:              Dysphagia, Endoscopy to confirm suspected                            neoplastic lesion of the stomach seen on previous                            imaging study, Assessment of upper GI pathologic                            condition for potential impact on the management of                            this patient's other diseases Medicines:                Monitored Anesthesia Care Procedure:                Pre-Anesthesia Assessment:                           - Prior to the procedure, a History and Physical                            was performed, and patient medications and                            allergies were reviewed. The patient's tolerance of                            previous anesthesia was also reviewed. The risks                            and benefits of the procedure and the sedation                            options and risks were discussed with the patient.                            All questions were answered, and informed consent                            was obtained. Prior Anticoagulants: The patient has                            taken no previous anticoagulant or antiplatelet                            agents. ASA Grade Assessment: III - A patient with  severe systemic disease. After reviewing the risks                            and benefits, the patient was deemed in                            satisfactory condition to undergo the procedure.                           After obtaining informed consent, the endoscope was                            passed under direct vision. Throughout the                            procedure, the patient's  blood pressure, pulse, and                            oxygen saturations were monitored continuously. The                            Model GIF-HQ190 [id number]) scope was introduced                            through the mouth, and advanced to the antrum of                            the stomach. The upper GI endoscopy was                            accomplished without difficulty. The patient                            tolerated the procedure well. Scope In: Scope Out: Findings:                 The esophagus was normal.                           A medium-sized, frond-like/villous, partially                            circumferential ulcerated mass with oozing bleeding                            and stigmata of recent bleeding was found in the                            cardia extending to EG junction. Biopsies were                            taken with a cold forceps for histology. Complications:            No immediate complications. Estimated Blood Loss:     Estimated blood loss was minimal. Impression:               -  Normal esophagus.                           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                            Biopsied. Recommendation:           - Mechanical soft diet and soft diet.                           - Continue present medications.                           - Await pathology results.                           - Repeat upper endoscopy for surveillance based on                            pathology results.                           - Return to GI clinic PRN. Mauri Pole, MD 04/11/2016 3:04:04 PM This report has been signed electronically.

## 2016-04-11 NOTE — Progress Notes (Addendum)
1543: Normal saline 1 L hung to give Fentanyl 100 mcg with patient complaint of mid-lower abdominal pain rated 5-6. 1552: Patient states pain completely relieved.

## 2016-04-11 NOTE — Patient Instructions (Signed)
YOU HAD AN ENDOSCOPIC PROCEDURE TODAY AT [facility]:   Refer to the procedure report that was given to you for any specific questions about what was found during the examination.  If the procedure report does not answer your questions, please call your gastroenterologist to clarify.  If you requested that your care partner not be given the details of your procedure findings, then the procedure report has been included in a sealed envelope for you to review at your convenience later.  YOU SHOULD EXPECT: Some feelings of bloating in the abdomen. Passage of more gas than usual.  Walking can help get rid of the air that was put into your GI tract during the procedure and reduce the bloating. If you had a lower endoscopy (such as a colonoscopy or flexible sigmoidoscopy) you may notice spotting of blood in your stool or on the toilet paper. If you underwent a bowel prep for your procedure, you may not have a normal bowel movement for a few days.  Please Note:  You might notice some irritation and congestion in your nose or some drainage.  This is from the oxygen used during your procedure.  There is no need for concern and it should clear up in a day or so.  SYMPTOMS TO REPORT IMMEDIATELY:  F  Following upper endoscopy (EGD)  Vomiting of blood or coffee ground material  New chest pain or pain under the shoulder blades  Painful or persistently difficult swallowing  New shortness of breath  Fever of 100F or higher  Black, tarry-looking stools  For urgent or emergent issues, a gastroenterologist can be reached at any hour by calling [phone].   DIET: Your first meal following the procedure should be a small meal and then it is ok to progress to your normal diet. Heavy or fried foods are harder to digest and may make you feel nauseous or bloated.  Likewise, meals heavy in dairy and vegetables can increase bloating.  Drink plenty of fluids but you should avoid alcoholic beverages  for 24 hours.  ACTIVITY:  You should plan to take it easy for the rest of today and you should NOT DRIVE or use heavy machinery until tomorrow (because of the sedation medicines used during the test).    FOLLOW UP: Our staff will call the number listed on your records the next business day following your procedure to check on you and address any questions or concerns that you may have regarding the information given to you following your procedure. If we do not reach you, we will leave a message.  However, if you are feeling well and you are not experiencing any problems, there is no need to return our call.  We will assume that you have returned to your regular daily activities without incident.  If any biopsies were taken you will be contacted by phone or by letter within the next 1-3 weeks.  Please call us at [phone] if you have not heard about the biopsies in 3 weeks.    SIGNATURES/CONFIDENTIALITY: You and/or your care partner have signed paperwork which will be entered into your electronic medical record.  These signatures attest to the fact that that the information above on your After Visit Summary has been reviewed and is understood.  Full responsibility of the confidentiality of this discharge information lies with you and/or your care-partner.  Continue soft mechanical diet.

## 2016-04-11 NOTE — Progress Notes (Signed)
Called to room to assist during endoscopic procedure.  Patient ID and intended procedure confirmed with present staff. Received instructions for my participation in the procedure from the performing physician.  

## 2016-04-12 ENCOUNTER — Telehealth: Payer: Self-pay

## 2016-04-12 NOTE — Telephone Encounter (Signed)
  Follow up Call-  Call back number 04/11/2016 04/05/2016  Post procedure Call Back phone  # [phone]  Permission to leave phone message No Yes    Patient was called for follow up after his procedure on 04/11/2016. I spoke with the patients wife and she reports that Robert Beck has returned to his normal daily activities without any difficulty.

## 2016-04-17 ENCOUNTER — Other Ambulatory Visit: Payer: Self-pay

## 2016-04-17 ENCOUNTER — Ambulatory Visit (HOSPITAL_BASED_OUTPATIENT_CLINIC_OR_DEPARTMENT_OTHER): Payer: BLUE CROSS/BLUE SHIELD | Admitting: Oncology

## 2016-04-17 ENCOUNTER — Encounter: Payer: Self-pay | Admitting: *Deleted

## 2016-04-17 ENCOUNTER — Ambulatory Visit
Admission: RE | Admit: 2016-04-17 | Discharge: 2016-04-17 | Disposition: A | Discharge status: Home / Self Care | Payer: BLUE CROSS/BLUE SHIELD | Source: Ambulatory Visit | Attending: Radiation Oncology | Admitting: Radiation Oncology

## 2016-04-17 ENCOUNTER — Encounter: Payer: Self-pay | Admitting: Radiation Oncology

## 2016-04-17 ENCOUNTER — Telehealth: Payer: Self-pay | Admitting: Oncology

## 2016-04-17 VITALS — BP 150/96 | HR 96 | Temp 99.3°F | Resp 18 | Ht 66.0 in | Wt 155.9 lb

## 2016-04-17 DIAGNOSIS — R591 Generalized enlarged lymph nodes: Secondary | ICD-10-CM | POA: Diagnosis not present

## 2016-04-17 DIAGNOSIS — R195 Other fecal abnormalities: Secondary | ICD-10-CM

## 2016-04-17 DIAGNOSIS — I10 Essential (primary) hypertension: Secondary | ICD-10-CM | POA: Diagnosis not present

## 2016-04-17 DIAGNOSIS — C160 Malignant neoplasm of cardia: Secondary | ICD-10-CM | POA: Insufficient documentation

## 2016-04-17 DIAGNOSIS — I700 Atherosclerosis of aorta: Secondary | ICD-10-CM | POA: Diagnosis not present

## 2016-04-17 DIAGNOSIS — D509 Iron deficiency anemia, unspecified: Secondary | ICD-10-CM | POA: Diagnosis not present

## 2016-04-17 DIAGNOSIS — Z794 Long term (current) use of insulin: Secondary | ICD-10-CM | POA: Insufficient documentation

## 2016-04-17 DIAGNOSIS — Q214 Aortopulmonary septal defect: Secondary | ICD-10-CM | POA: Insufficient documentation

## 2016-04-17 DIAGNOSIS — Z79899 Other long term (current) drug therapy: Secondary | ICD-10-CM | POA: Diagnosis not present

## 2016-04-17 DIAGNOSIS — M109 Gout, unspecified: Secondary | ICD-10-CM

## 2016-04-17 DIAGNOSIS — Z9889 Other specified postprocedural states: Secondary | ICD-10-CM | POA: Insufficient documentation

## 2016-04-17 DIAGNOSIS — J449 Chronic obstructive pulmonary disease, unspecified: Secondary | ICD-10-CM | POA: Diagnosis not present

## 2016-04-17 DIAGNOSIS — F329 Major depressive disorder, single episode, unspecified: Secondary | ICD-10-CM | POA: Insufficient documentation

## 2016-04-17 DIAGNOSIS — G2581 Restless legs syndrome: Secondary | ICD-10-CM | POA: Diagnosis not present

## 2016-04-17 DIAGNOSIS — E1140 Type 2 diabetes mellitus with diabetic neuropathy, unspecified: Secondary | ICD-10-CM | POA: Insufficient documentation

## 2016-04-17 DIAGNOSIS — Z923 Personal history of irradiation: Secondary | ICD-10-CM | POA: Insufficient documentation

## 2016-04-17 DIAGNOSIS — D630 Anemia in neoplastic disease: Secondary | ICD-10-CM

## 2016-04-17 DIAGNOSIS — N281 Cyst of kidney, acquired: Secondary | ICD-10-CM | POA: Insufficient documentation

## 2016-04-17 DIAGNOSIS — I2510 Atherosclerotic heart disease of native coronary artery without angina pectoris: Secondary | ICD-10-CM | POA: Insufficient documentation

## 2016-04-17 DIAGNOSIS — E785 Hyperlipidemia, unspecified: Secondary | ICD-10-CM

## 2016-04-17 DIAGNOSIS — R590 Localized enlarged lymph nodes: Secondary | ICD-10-CM | POA: Insufficient documentation

## 2016-04-17 DIAGNOSIS — G893 Neoplasm related pain (acute) (chronic): Secondary | ICD-10-CM | POA: Diagnosis not present

## 2016-04-17 DIAGNOSIS — E119 Type 2 diabetes mellitus without complications: Secondary | ICD-10-CM

## 2016-04-17 DIAGNOSIS — Z87891 Personal history of nicotine dependence: Secondary | ICD-10-CM | POA: Insufficient documentation

## 2016-04-17 DIAGNOSIS — M1990 Unspecified osteoarthritis, unspecified site: Secondary | ICD-10-CM | POA: Diagnosis not present

## 2016-04-17 HISTORY — DX: Anemia, unspecified: D64.9

## 2016-04-17 MED ORDER — OXYCODONE-ACETAMINOPHEN 5-325 MG PO TABS: 1.0000 | ORAL_TABLET | ORAL | 0 refills | Status: DC | PRN

## 2016-04-17 NOTE — Progress Notes (Signed)
[place] New Patient Consult   Referring LO:[id number] Robert Beck 55 y.o.  [birth year]    Reason for Referral: Gastric cancer   HPI: Robert Beck reports developing low anterior chest discomfort beginning in December 2016. The discomfort has been progressive. He saw Dr. Virgina Jock and was diagnosed with iron deficiency anemia. He was referred to Dr. Silverio Decamp and was taken to an upper endoscopy on 04/05/2016. A mass was found at the GE junction and in the gastric cardia. The mass was oozing. A biopsy revealed benign gastric mucosa with ulceration and inflammation. He was taken to a repeat endoscopy on 04/11/2016. A mass was again confirmed at the gastric cardia extending to the GE junction. Biopsies were taken. The pathology [id number]) confirmed adenocarcinoma.  He was referred for CTs of the chest, abdomen, and pelvis on 04/09/2016. A T7 compression deformity was noted. No pulmonary nodule. Extensive mediastinal lymphadenopathy, a right hilar node, and a 27 x 25 mm right supraclavicular node were seen. The liver is unremarkable. A mass is noted in the gastric cardia extending to the GE junction.  He saw Dr. Isidore Moos. She does not recommend radiation if he is confirmed to have metastatic disease.  Robert Beck has noted improvement in chest discomfort since undergoing the endoscopy procedures.  Past Medical History:  Diagnosis Date  . Anemia 2017   . Asthma   . Gastric cancer  July 2017   . Depression   . Gout   . Hyperlipidemia   . Hypertension   . 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 Traumatic thoracic spine compression fracture    . Osteoarthritis   . RLS (restless legs syndrome)   . Type 2 diabetes, controlled, with neuropathy ([place])   . Vitamin B12 deficiency     Past Surgical History:  Procedure Laterality Date  . ANKLE FRACTURE SURGERY    . CIRCUMCISION  2008  . COLONOSCOPY    . KNEE SURGERY    . NOSE SURGERY    . TONGUE SURGERY  2007   small growth removed from underneath his tongue, not cancerous.   [facility] UPPER GASTROINTESTINAL ENDOSCOPY      Medications: Reviewed  Allergies: No Known Allergies  Family history: His mother had breast cancer and died of leukemia. He has 2 brothers. No other family history of cancer.  Social History:   He lives in [place]. He is disabled following the thoracic compression fracture. He previously worked in [employer]. No transfusion history. No risk factor for HIV or hepatitis. He does not smoke cigarettes. He reports rare alcohol use.   History  Smoking Status  . Former Smoker  . Quit date: 09/24/2002  Smokeless Tobacco  . Never Used      ROS:   Positives include:30 pound weight loss, odynophagia with solids, nausea and intermittent dry heaves, weak urinary stream, anterior chest discomfort  A complete ROS was otherwise negative.  Physical Exam:  Blood pressure (!) 150/96, pulse 96, temperature 99.3 F (37.4 C), temperature source Oral, resp. rate 18, height _0  (1.676 m), weight 155 lb 14.4 oz (70.7 kg), SpO2 100 %.  HEENT: Slight enlargement of the right tonsil, oropharynx without visible mass, neck without mass Lungs: Clear bilaterally Cardiac: Regular rate and rhythm Abdomen: No hepatomegaly, no mass, no apparent ascites, nontender GU: Testes without mass  Vascular: No leg edema Lymph nodes: No cervical, axillary, or inguinal nodes. Approximate 2 cm right supraclavicular node-mobile Neurologic: Alert and oriented, the motor exam  appears intact in the upper and lower extremities Skin: No rash Musculoskeletal: No spine tenderness   Imaging: CT images from 04/09/2016-reviewed   Assessment/Plan:   1. Gastric cancer, gastric cardia mass standing to the GE junction confirmed on endoscopy 04/11/2016, biopsy confirmed adenocarcinoma  Staging CTs of the chest, abdomen, and pelvis 04/09/2016-gastric cardia mass, mediastinal, right hilar, and right  supraclavicular lymphadenopathy 2. 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   Diabetes  4.   Gout  5.   Hypertension  6.   Hyperlipidemia  7.   History of a T7 compression fracture  8.   Pain secondary to the gastric mass and mediastinal lymphadenopathy   Disposition:   Robert Beck has been diagnosed with gastric cancer. I discussed the staging evaluation to date and treatment options with him. He is scheduled to undergo a diagnostic biopsy of the right supraclavicular node on 04/20/2016. If the lymph node is involved with metastatic carcinoma he will have stage IV disease. I recommend systemic chemotherapy if he is confirmed to have metastatic disease involving the right supraclavicular lymph node. I discussed the case with Dr. Isidore Moos.  Robert Beck lives approximately 1 hour from the [facility] and does not have a closer Cancer treatment option available. We will most likely recommend CAPOX chemotherapy.  We will submit the tumor tissue for Foundation 1 testing to include evaluation for HER-2 amplification.  Robert Beck will return for an office visit and further discussion on 04/25/2016. We gave him a prescription for oxycodone to use as needed for pain.  He will be scheduled for placement of a Port-A-Cath.  Approximately 50 minutes were spent with the patient today. The majority of the time was used for counseling and coordination of care.  Betsy Coder, MD  04/17/2016, 5:46 PM

## 2016-04-17 NOTE — Patient Instructions (Signed)
Care Plan Summary- 04/17/2016 Name:  Robert Beck      DOB: Feb 28, 1961 Your Medical Team:  Medical Oncologist:  Dr. Ma Rings Radiation Oncologist:  Dr. Eppie Gibson  Surgeon:    Type of Cancer: Gastric (stomach) adenocarcinoma  Stage/Grade: Undetermined *Exact staging of your cancer is based on size of the tumor, depth of invasion, involvement of lymph nodes or not, and whether or not the cancer has spread beyond the primary site.   Recommendations: Based on information available as of today's consult. Recommendations may change depending on the results of further tests or exams. 1) Chemotherapy with XELOX regimen every 3 weeks 2) Proceed with biopsy for staging 3) You will need PAC for chemotherapy ___________________________________________________________________________ Next Steps: 1) Our office will schedule PAC-hope for 7/28 or 8/2 2) Chemo teaching class prior to 1st treatment 3) Percocet for pain-call if pain not controlled. Take Miralax 17 grams daily for constipation if needed. Questions? [name], RN, BSN at [phone]. Manuela Schwartz is your Oncology Nurse Navigator and is available to assist you while you're receiving your medical care at [facility].

## 2016-04-17 NOTE — Progress Notes (Signed)
Oncology Nurse Navigator Documentation  Oncology Nurse Navigator Flowsheets 04/17/2016  Navigator Location [facility]  Navigator Encounter Type Initial MedOnc  Abnormal Finding Date -  Confirmed Diagnosis Date 04/11/2016  Patient Visit Type MedOnc;Initial  Treatment Phase Pre-Tx/Tx Discussion  Barriers/Navigation Needs Financial;Family concerns;Coordination of Care  Interventions Referrals;Coordination of Care;Education Method  Referrals Social Work;financial counselling;Nutrition/dietician  Coordination of Care Other--left VM with [facility] IR dept at [phone] requesting port on 04/20/16 if possible (already there for bx)  Education Method Verbal;Written;Teach-back  Support Groups/Services GI Support Group;[place];Other--[place] support services  Acuity Level 2  Time Spent with Patient 60  Met with patient and significant other, [name] during new patient visit. Explained the role of the GI Nurse Navigator and provided New Patient Packet with information on: 1. Gastric cancer--provided info on Xeloda and Oxaliplatin  2. Support groups 3. Advanced Directives 4. Fall Safety Plan Answered questions, reviewed current treatment plan using TEACH back and provided emotional support. Provided copy of current treatment plan.  Robert Beck is not employed ever since falling off a roof years ago and having back/knee injury. Says he does not qualify for disability for this problem. He pays $900/month for a private policy health insurance. His girlfriend supports him and is his transportation and income. She has limited days off from work, so this will require some schedule coordination for her. Dr. Virgina Jock has her FMLA forms and is working on these for him. [name] has no children. Will request dietician see him at some point for weight loss due to solid dysphagia. Referral to CSW to advise on how to apply for disability due to cancer diagnosis and likely stage IV as well as any other services he may apply  for. Will request financial counselor reach out at next appointment to review what he may qualify for here. Made call to [facility] IR to determine if port can be placed on 7/28-awaiting return call.  [name], RN, BSN GI Oncology [place]

## 2016-04-17 NOTE — Telephone Encounter (Signed)
Gave pt cal & avs °

## 2016-04-17 NOTE — Addendum Note (Signed)
Encounter addended by: Ernst Spell, RN on: 04/17/2016  2:33 PM<BR>    Actions taken: Charge Capture section accepted

## 2016-04-17 NOTE — Progress Notes (Signed)
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 Adenocarcinoma of gastric cardia (HCC) 151.0 C16.0 Ambulatory referral to Social Work     HISTORY OF PRESENT ILLNESS::Robert Beck is a 55 y.o. male who presents with primary disease of the stomach.  He has kindly been referred to [place] by Dr. Myrtice Lauth, his gastroenterologist after being referred to him by his PCP, Dr. Virgina Jock.  The patient described to Dr. Myrtice Lauth that his his stools have been black and tarry on occasion coupled with some nausea. This prompted Dr. Myrtice Lauth to schedule a EGD and colonoscopy with Dr. Silverio Decamp.  An upper endoscopy performed by Dr. Silverio Decamp on 04/05/16 revealed disease of his stomach.  The patient was notified of this diagnosis, and his here today to consider his options for radiotherapy with Dr. Isidore Moos.  Doctor Nandigam appreciated a likely malignant gastric tumor in the cardia of the stomach on upper endoscopy.  The initial biopsy of 04/05/2016 was benign.  The subsequent biopsy of 04/11/16 showed adenocarcinoma.  Dr. Silverio Decamp remarked that the mass extended from the cardia to the GE junction.  The esophagus was normal.  CT imaging of the chest, abdomen, and pelvis on 04/09/16 revealed a 4.5x3.7 mass medial to the gastric cardia which may extend into the GE junction. Mediastinal and right supraclavicular adenopathy were concerning for metastatic disease. The patient has not had a PET scan. The patient has an appointment with Dr. Learta Codding later today.    Of note: the patient reports a weight loss of 45-50 pounds since December.  Ultrasound guided biopsy of the right neck mass is scheduled for 04/20/16.  Of note: the patient  was a pack smoker or more per day smoker but has since quit around ten years ago.  Denies heavy drinking.  Has had severe heartburn off/on at time.  No PETs scheduled.  PREVIOUS RADIATION THERAPY: No  PAST MEDICAL HISTORY:  has a past medical history of Anemia; Asthma; COPD (chronic obstructive pulmonary disease) ([place]); Depression; Gout; Hyperlipidemia; Hypertension; Iron deficiency anemia; Ischemia; Osteoarthritis; RLS (restless legs syndrome); Type 2 diabetes, controlled, with neuropathy ([place]); and Vitamin B12 deficiency.    PAST SURGICAL HISTORY: Past Surgical History:  Procedure Laterality Date  . ANKLE FRACTURE SURGERY    . CIRCUMCISION  2008  . COLONOSCOPY    . KNEE SURGERY    . NOSE SURGERY    . TONGUE SURGERY  2007   small growth removed from underneath his tongue, not cancerous.   Marland Kitchen UPPER GASTROINTESTINAL ENDOSCOPY      FAMILY HISTORY: family history includes Breast cancer in his mother; Heart disease in his father; Leukemia in his mother.   SOCIAL HISTORY:  reports that he quit smoking about 13 years ago. He has never used smokeless tobacco. He reports that he drinks alcohol. He reports that he does not use drugs.  ALLERGIES: Review of patient's allergies indicates no known allergies.  MEDICATIONS:  Current Outpatient Prescriptions  Medication Sig Dispense Refill  . allopurinol (ZYLOPRIM) 300 MG tablet Take 300 mg by mouth daily. He is taking every other day.    . canagliflozin (INVOKANA) 100 MG TABS tablet Take 100 mg by mouth daily before breakfast. He  reports he is only taking 50 mg daily.    Marland Kitchen doxycycline (VIBRAMYCIN) 100 MG capsule Take 100 mg by mouth 2 (two) times daily.    . Ferrous Sulfate (SLOW RELEASE IRON) 47.5 MG TBCR Take 1 tablet by mouth daily as needed.    Marland Kitchen HYDROcodone-acetaminophen (NORCO/VICODIN) 5-325 MG tablet Take 1-2 tablets by mouth every 6 (six) hours as needed for moderate pain or severe pain. 30 tablet 0  . insulin NPH-regular Human (HUMULIN  70/30) (70-30) 100 UNIT/ML injection Inject 40 Units into the skin daily.    Marland Kitchen levalbuterol (XOPENEX HFA) 45 MCG/ACT inhaler Inhale 2 puffs into the lungs 3 (three) times daily as needed for wheezing.    . metFORMIN (GLUCOPHAGE) 500 MG tablet Take 500 mg by mouth 3 (three) times daily.    Marland Kitchen olmesartan-hydrochlorothiazide (BENICAR HCT) 40-12.5 MG tablet Take 1 tablet by mouth daily. He is only taking a 1/2 tablet daily.    . Omega-3 Fatty Acids (FISH OIL) 1000 MG CAPS Take 1 capsule by mouth daily. Reported on 04/05/2016    . pantoprazole (PROTONIX) 40 MG tablet Take 1 tablet (40 mg total) by mouth 2 (two) times daily. 60 tablet 3  . rOPINIRole (REQUIP) 1 MG tablet Take 1 mg by mouth 3 (three) times daily.    . simvastatin (ZOCOR) 20 MG tablet Take 20 mg by mouth daily. Taking every other day.    . sucralfate (CARAFATE) 1 GM/10ML suspension Take 10 mLs (1 g total) by mouth 4 (four) times daily. Take 1 gram before meals and at bedtime. 420 mL 1  . Ascorbic Acid (VITAMIN C) 100 MG tablet Take 100 mg by mouth daily.    . colchicine 0.6 MG tablet Take 0.6 mg by mouth daily.    . Fluticasone-Salmeterol (ADVAIR) 100-50 MCG/DOSE AEPB Inhale 1 puff into the lungs 2 (two) times daily. Reported on 04/05/2016    . Multiple Vitamin (MULTIVITAMIN) tablet Take 1 tablet by mouth daily. Reported on 04/05/2016     No current facility-administered medications for this encounter.     REVIEW OF SYSTEMS:  Notable for that above. The patient reports that he has had upper chest pain since December which has worsened since. The patient reports that a couple of hydrocodone in the morning and the afternoon alleviates his pain symptoms. He describes the pain as a burning near his diagram. The patient reports also that the pain can be intense at time and comes in 'surges.'  The patient also says that his pain can feel like acid reflux or heartburn.     PHYSICAL EXAM:  height is 5\' 6"  (1.676 m) and weight is 156 lb 11.2 oz (71.1  kg). His temperature is 98.6 F (37 C). His blood pressure is 157/88 (abnormal) and his pulse is 93. His oxygen saturation is 99%.   General: Alert and oriented, in no acute distress HEENT: Head is normocephalic. Extraocular movements are intact.  Patient has some fullness of the right tonsil but otherwise no appreciable in the oropharynx in the oral cavity.   Neck: Neck is supple, no palpable cervical or supraclavicular lymphadenopathy. Heart: Regular in rhythm but tachycardic Chest: Clear to auscultation bilaterally, with no rhonchi, wheezes, or rales. Abdomen:  nontender, nondistended, with no rigidity or guarding.There is a firmness to palpation in the LUQ of his abdomen  consistent with the gastric mass. Extremities: No cyanosis or edema. Lymphatics: see Neck Exam Skin: No concerning lesions. Musculoskeletal: symmetric strength and muscle tone throughout. Neurologic: Cranial nerves  II through XII are grossly intact. No obvious focalities. Speech is fluent. Coordination is intact. Psychiatric: Judgment and insight are intact. Affect is appropriate.    ECOG = 1  0 - Asymptomatic (Fully active, able to carry on all predisease activities without restriction)  1 - Symptomatic but completely ambulatory (Restricted in physically strenuous activity but ambulatory and able to carry out work of a light or sedentary nature. For example, light housework, office work)  2 - Symptomatic, <50% in bed during the day (Ambulatory and capable of all self care but unable to carry out any work activities. Up and about more than 50% of waking hours)  3 - Symptomatic, >50% in bed, but not bedbound (Capable of only limited self-care, confined to bed or chair 50% or more of waking hours)  4 - Bedbound (Completely disabled. Cannot carry on any self-care. Totally confined to bed or chair)  5 - Death   Eustace Pen MM, Creech RH, Tormey DC, et al. [phone]). "Toxicity and response criteria of the [facility]". [place] Oncol. 5 (6): 649-55   LABORATORY DATA:  No results found for: WBC, HGB, HCT, MCV, PLT CMP     Component Value Date/Time   BUN 7 04/05/2016 1519   CREATININE 0.70 04/05/2016 1519         RADIOGRAPHY: Ct Chest W Contrast  Result Date: 04/09/2016 CLINICAL DATA:  Newly diagnosed mass and gastroesophageal junction on endoscopy. EXAM: CT CHEST, ABDOMEN, AND PELVIS WITH CONTRAST TECHNIQUE: Multidetector CT imaging of the chest, abdomen and pelvis was performed following the standard protocol during bolus administration of intravenous contrast. CONTRAST:  164mL ISOVUE-300 IOPAMIDOL (ISOVUE-300) INJECTION 61% COMPARISON:  None. FINDINGS: CT CHEST Moderate compression deformity T7 vertebral body is noted which appears to be due to old fracture. No pneumothorax or pleural effusion is noted. No acute pulmonary disease is noted. No pulmonary nodule or mass is noted. There is no evidence of thoracic aortic dissection or aneurysm. Extensive mediastinal adenopathy is noted 34 x 18 mm lymph node is noted in anterior mediastinum. Multiple enlarged pretracheal lymph nodes are noted, with the largest measuring 24 x 17 mm. 12 x 6 mm lymph node is noted in aortopulmonary window. 13 x 10 mm right hilar lymph node is noted. 27 x 25 mm right supraclavicular lymph node is noted. CT ABDOMEN AND PELVIS No gallstones are noted. The liver, spleen and pancreas are unremarkable. Adrenal glands appear normal. Simple left renal cyst is noted. No hydronephrosis or renal obstruction is noted. 45 x 37 mm mass is seen medial to gastric cardia which may extend into gastroesophageal junction. There is no evidence of bowel obstruction. The appendix appears normal. Atherosclerosis of abdominal aorta is noted. No abnormal fluid collection is noted. Urinary bladder is decompressed. Prostatic calcifications are noted. No significant osseous abnormality is noted. IMPRESSION: Aortic atherosclerosis. Coronary artery  calcifications are noted suggesting Coronary artery disease. 4.5 x 3.7 cm mass is seen medial to gastric cardia which may extend into gastroesophageal junction and is highly concerning for malignancy. This correlates with reported findings from endoscopy. Mediastinal and right supraclavicular adenopathy is noted concerning for metastatic disease. Electronically Signed   By: Marijo Conception, M.D.   On: 04/09/2016 09:49   Ct Abdomen Pelvis W Contrast  Result Date: 04/09/2016 CLINICAL DATA:  Newly diagnosed mass and gastroesophageal junction on endoscopy. EXAM: CT CHEST, ABDOMEN, AND PELVIS WITH CONTRAST TECHNIQUE: Multidetector CT imaging of the chest, abdomen and pelvis was performed following the  standard protocol during bolus administration of intravenous contrast. CONTRAST:  120mL ISOVUE-300 IOPAMIDOL (ISOVUE-300) INJECTION 61% COMPARISON:  None. FINDINGS: CT CHEST Moderate compression deformity T7 vertebral body is noted which appears to be due to old fracture. No pneumothorax or pleural effusion is noted. No acute pulmonary disease is noted. No pulmonary nodule or mass is noted. There is no evidence of thoracic aortic dissection or aneurysm. Extensive mediastinal adenopathy is noted 34 x 18 mm lymph node is noted in anterior mediastinum. Multiple enlarged pretracheal lymph nodes are noted, with the largest measuring 24 x 17 mm. 12 x 6 mm lymph node is noted in aortopulmonary window. 13 x 10 mm right hilar lymph node is noted. 27 x 25 mm right supraclavicular lymph node is noted. CT ABDOMEN AND PELVIS No gallstones are noted. The liver, spleen and pancreas are unremarkable. Adrenal glands appear normal. Simple left renal cyst is noted. No hydronephrosis or renal obstruction is noted. 45 x 37 mm mass is seen medial to gastric cardia which may extend into gastroesophageal junction. There is no evidence of bowel obstruction. The appendix appears normal. Atherosclerosis of abdominal aorta is noted. No abnormal  fluid collection is noted. Urinary bladder is decompressed. Prostatic calcifications are noted. No significant osseous abnormality is noted. IMPRESSION: Aortic atherosclerosis. Coronary artery calcifications are noted suggesting Coronary artery disease. 4.5 x 3.7 cm mass is seen medial to gastric cardia which may extend into gastroesophageal junction and is highly concerning for malignancy. This correlates with reported findings from endoscopy. Mediastinal and right supraclavicular adenopathy is noted concerning for metastatic disease. Electronically Signed   By: Marijo Conception, M.D.   On: 04/09/2016 09:49      IMPRESSION/PLAN: Today, I talked to the patient about the findings and work-up thus far. We discussed the patient's diagnosis of adenocarcinoma of the gastric cardia. We discussed the available treatments.  I'm going to see the patient back on an as-needed basis pending biopsy results for presumed metastatic disease; the CT of his chest is highly concerning.  Dr. Learta Codding may decide to order a PET scan for staging. Biopsy of neck mass is pending. I told the patient that I suspect he may receive systemic therapy alone for his initial treatment, unless final staging is not metastatic.    This was difficult news for the patient and his significant other to hear.  I will make a referal to social work.  Emotional support was provided today.  As described above I will not schedule any follow up visits due to his presume metastatic disease, but I can see him as needed.    The patient was encouraged to ask questions that I answered to the best of my ability.   I gave both the patient and his significant other my card to call with additional questions.   __________________________________________   Eppie Gibson, MD   This document serves as a record of services personally performed by Eppie Gibson , MD. It was created on his behalf by Truddie Hidden, a trained medical scribe. The creation of  this record is based on the scribe's personal observations and the provider's statements to them. This document has been checked and approved by the attending provider.

## 2016-04-18 ENCOUNTER — Telehealth: Payer: Self-pay | Admitting: *Deleted

## 2016-04-18 ENCOUNTER — Encounter: Payer: Self-pay | Admitting: Oncology

## 2016-04-18 NOTE — Telephone Encounter (Signed)
Call to [facility] IR scheduler to inquire if his port can be placed on 7/28 when he is there for his US biopsy? She will ask radiologist and call patient with the information. If not able to be done on 7/28 will call IR at [facility] for 04/25/16 when in [facility] seeing Dr. Benay Spice again.

## 2016-04-18 NOTE — Progress Notes (Unsigned)
Called patient and left message to introduce myself as Estate manager/land agent and to discuss financial questions or concerns. Left return name and number.

## 2016-04-19 ENCOUNTER — Other Ambulatory Visit: Payer: Self-pay | Admitting: Radiology

## 2016-04-19 ENCOUNTER — Telehealth: Payer: Self-pay | Admitting: *Deleted

## 2016-04-19 ENCOUNTER — Other Ambulatory Visit: Payer: Self-pay | Admitting: *Deleted

## 2016-04-19 NOTE — Telephone Encounter (Signed)
Left VM with IR at [facility] to follow up on having port placed on 7/28 along with US biopsy. Requested confirmation as soon as possible to allow navigator to schedule for 8/2 if necessary.

## 2016-04-19 NOTE — Telephone Encounter (Signed)
Left VM on home and mobile # that IR will be able to place his PAC on 04/20/16 after his biopsy.

## 2016-04-20 ENCOUNTER — Encounter (HOSPITAL_COMMUNITY): Payer: Self-pay

## 2016-04-20 ENCOUNTER — Other Ambulatory Visit: Payer: Self-pay | Admitting: Oncology

## 2016-04-20 ENCOUNTER — Ambulatory Visit (HOSPITAL_COMMUNITY)
Admission: RE | Admit: 2016-04-20 | Discharge: 2016-04-20 | Disposition: A | Discharge status: Home / Self Care | Payer: BLUE CROSS/BLUE SHIELD | Source: Ambulatory Visit | Attending: Oncology | Admitting: Oncology

## 2016-04-20 ENCOUNTER — Encounter: Payer: Self-pay | Admitting: *Deleted

## 2016-04-20 ENCOUNTER — Other Ambulatory Visit: Payer: Self-pay | Admitting: *Deleted

## 2016-04-20 ENCOUNTER — Ambulatory Visit (HOSPITAL_COMMUNITY)
Admission: RE | Admit: 2016-04-20 | Discharge: 2016-04-20 | Disposition: A | Discharge status: Home / Self Care | Payer: BLUE CROSS/BLUE SHIELD | Source: Ambulatory Visit | Attending: Gastroenterology | Admitting: Gastroenterology

## 2016-04-20 DIAGNOSIS — C770 Secondary and unspecified malignant neoplasm of lymph nodes of head, face and neck: Secondary | ICD-10-CM | POA: Insufficient documentation

## 2016-04-20 DIAGNOSIS — G2581 Restless legs syndrome: Secondary | ICD-10-CM | POA: Insufficient documentation

## 2016-04-20 DIAGNOSIS — R590 Localized enlarged lymph nodes: Secondary | ICD-10-CM

## 2016-04-20 DIAGNOSIS — R070 Pain in throat: Secondary | ICD-10-CM | POA: Diagnosis not present

## 2016-04-20 DIAGNOSIS — C160 Malignant neoplasm of cardia: Secondary | ICD-10-CM | POA: Diagnosis not present

## 2016-04-20 DIAGNOSIS — M109 Gout, unspecified: Secondary | ICD-10-CM | POA: Diagnosis not present

## 2016-04-20 DIAGNOSIS — E785 Hyperlipidemia, unspecified: Secondary | ICD-10-CM | POA: Diagnosis not present

## 2016-04-20 DIAGNOSIS — I10 Essential (primary) hypertension: Secondary | ICD-10-CM | POA: Diagnosis not present

## 2016-04-20 DIAGNOSIS — Z8249 Family history of ischemic heart disease and other diseases of the circulatory system: Secondary | ICD-10-CM | POA: Diagnosis not present

## 2016-04-20 DIAGNOSIS — J449 Chronic obstructive pulmonary disease, unspecified: Secondary | ICD-10-CM | POA: Insufficient documentation

## 2016-04-20 DIAGNOSIS — Z806 Family history of leukemia: Secondary | ICD-10-CM | POA: Insufficient documentation

## 2016-04-20 DIAGNOSIS — Z803 Family history of malignant neoplasm of breast: Secondary | ICD-10-CM | POA: Diagnosis not present

## 2016-04-20 DIAGNOSIS — K3189 Other diseases of stomach and duodenum: Secondary | ICD-10-CM

## 2016-04-20 DIAGNOSIS — M1990 Unspecified osteoarthritis, unspecified site: Secondary | ICD-10-CM | POA: Insufficient documentation

## 2016-04-20 DIAGNOSIS — Z87891 Personal history of nicotine dependence: Secondary | ICD-10-CM | POA: Diagnosis not present

## 2016-04-20 DIAGNOSIS — E1140 Type 2 diabetes mellitus with diabetic neuropathy, unspecified: Secondary | ICD-10-CM | POA: Diagnosis not present

## 2016-04-20 HISTORY — PX: IR GENERIC HISTORICAL: IMG1180011

## 2016-04-20 LAB — GLUCOSE, CAPILLARY
GLUCOSE-CAPILLARY: 51 mg/dL — AB (ref 65–99)
GLUCOSE-CAPILLARY: 92 mg/dL (ref 65–99)
GLUCOSE-CAPILLARY: 92 mg/dL (ref 65–99)

## 2016-04-20 LAB — BASIC METABOLIC PANEL
Anion gap: 7 (ref 5–15)
BUN: 11 mg/dL (ref 6–20)
CHLORIDE: 113 mmol/L — AB (ref 101–111)
CO2: 25 mmol/L (ref 22–32)
Calcium: 9.6 mg/dL (ref 8.9–10.3)
Creatinine, Ser: 0.65 mg/dL (ref 0.61–1.24)
GFR calc Af Amer: >60 mL/min (ref >60)
GFR calc non Af Amer: >60 mL/min (ref >60)
GLUCOSE: 48 mg/dL — AB (ref 65–99)
POTASSIUM: 2.7 mmol/L — AB (ref 3.5–5.1)
Sodium: 145 mmol/L (ref 135–145)

## 2016-04-20 LAB — CBC WITH DIFFERENTIAL/PLATELET
Basophils Absolute: 0 10*3/uL (ref 0.0–0.1)
Basophils Relative: 0 %
EOS PCT: 1 %
Eosinophils Absolute: 0.1 10*3/uL (ref 0.0–0.7)
HEMATOCRIT: 37.7 % — AB (ref 39.0–52.0)
Hemoglobin: 11.2 g/dL — ABNORMAL LOW (ref 13.0–17.0)
LYMPHS ABS: 2.5 10*3/uL (ref 0.7–4.0)
LYMPHS PCT: 17 %
MCH: 22.6 pg — AB (ref 26.0–34.0)
MCHC: 29.7 g/dL — ABNORMAL LOW (ref 30.0–36.0)
MCV: 76 fL — AB (ref 78.0–100.0)
MONO ABS: 1.2 10*3/uL — AB (ref 0.1–1.0)
Monocytes Relative: 8 %
Neutro Abs: 10.7 10*3/uL — ABNORMAL HIGH (ref 1.7–7.7)
Neutrophils Relative %: 74 %
PLATELETS: 474 10*3/uL — AB (ref 150–400)
RBC: 4.96 MIL/uL (ref 4.22–5.81)
RDW: 14.8 % (ref 11.5–15.5)
WBC: 14.5 10*3/uL — AB (ref 4.0–10.5)

## 2016-04-20 LAB — PROTIME-INR
INR: 1.1
PROTHROMBIN TIME: 14.3 s (ref 11.4–15.2)

## 2016-04-20 MED ORDER — FLUMAZENIL 0.5 MG/5ML IV SOLN
INTRAVENOUS | Status: AC
  Filled 2016-04-20: qty 5

## 2016-04-20 MED ORDER — FENTANYL CITRATE (PF) 100 MCG/2ML IJ SOLN
INTRAMUSCULAR | Status: AC | PRN
  Administered 2016-04-20 (×2): 50 ug via INTRAVENOUS

## 2016-04-20 MED ORDER — POTASSIUM CHLORIDE CRYS ER 20 MEQ PO TBCR: 20.0000 meq | EXTENDED_RELEASE_TABLET | Freq: Every day | ORAL | 0 refills | Status: DC

## 2016-04-20 MED ORDER — LIDOCAINE HCL 1 % IJ SOLN
INTRAMUSCULAR | Status: AC | PRN
  Administered 2016-04-20: 10 mL

## 2016-04-20 MED ORDER — LIDOCAINE-PRILOCAINE 2.5-2.5 % EX CREA: 1.0000 "application " | TOPICAL_CREAM | CUTANEOUS | 0 refills | Status: DC | PRN

## 2016-04-20 MED ORDER — HEPARIN SOD (PORK) LOCK FLUSH 100 UNIT/ML IV SOLN
INTRAVENOUS | Status: AC
  Filled 2016-04-20: qty 5

## 2016-04-20 MED ORDER — LIDOCAINE HCL 1 % IJ SOLN
INTRAMUSCULAR | Status: AC
  Filled 2016-04-20: qty 20

## 2016-04-20 MED ORDER — DEXTROSE 50 % IV SOLN
25.0000 mL | Freq: Once | INTRAVENOUS | Status: AC
  Administered 2016-04-20: 50 mL via INTRAVENOUS

## 2016-04-20 MED ORDER — LIDOCAINE HCL (PF) 1 % IJ SOLN
INTRAMUSCULAR | Status: AC
  Filled 2016-04-20: qty 30

## 2016-04-20 MED ORDER — DEXTROSE 50 % IV SOLN
INTRAVENOUS | Status: AC
  Filled 2016-04-20: qty 50

## 2016-04-20 MED ORDER — SODIUM CHLORIDE 0.9 % IV SOLN
INTRAVENOUS | Status: DC
  Administered 2016-04-20: 12:00:00 via INTRAVENOUS

## 2016-04-20 MED ORDER — NALOXONE HCL 0.4 MG/ML IJ SOLN
INTRAMUSCULAR | Status: AC
  Filled 2016-04-20: qty 1

## 2016-04-20 MED ORDER — MIDAZOLAM HCL 2 MG/2ML IJ SOLN
INTRAMUSCULAR | Status: AC | PRN
Start: 2016-04-20 — End: 2016-04-20
  Administered 2016-04-20 (×2): 1 mg via INTRAVENOUS

## 2016-04-20 MED ORDER — FENTANYL CITRATE (PF) 100 MCG/2ML IJ SOLN
INTRAMUSCULAR | Status: AC
  Filled 2016-04-20: qty 4

## 2016-04-20 MED ORDER — CEFAZOLIN SODIUM-DEXTROSE 2-4 GM/100ML-% IV SOLN: 2.0000 g | INTRAVENOUS | Status: DC

## 2016-04-20 MED ORDER — CEFAZOLIN SODIUM-DEXTROSE 2-4 GM/100ML-% IV SOLN
INTRAVENOUS | Status: AC
  Administered 2016-04-20: 2000 mg
  Filled 2016-04-20: qty 100

## 2016-04-20 MED ORDER — POTASSIUM CHLORIDE CRYS ER 20 MEQ PO TBCR
40.0000 meq | EXTENDED_RELEASE_TABLET | Freq: Once | ORAL | Status: AC
  Administered 2016-04-20: 40 meq via ORAL
  Filled 2016-04-20: qty 2

## 2016-04-20 MED ORDER — MIDAZOLAM HCL 2 MG/2ML IJ SOLN
INTRAMUSCULAR | Status: AC
  Filled 2016-04-20: qty 4

## 2016-04-20 NOTE — Progress Notes (Signed)
  Oncology Nurse Navigator Documentation  Navigator Location: [facility] (04/20/16 1603) Navigator Encounter Type: Letter/Fax/Email (04/20/16 1603)   MD completed script for Xeloda 500 mg tablets: 1000 mg bid with no breaks #12 tab. Script forwarded oral chemotherapy pharmacist.

## 2016-04-20 NOTE — Sedation Documentation (Signed)
Neck mass biopsy complete. Will proceed with port a cath placement.

## 2016-04-20 NOTE — Discharge Instructions (Signed)
Implanted Port Insertion, Care After Refer to this sheet in the next few weeks. These instructions provide you with information on caring for yourself after your procedure. Your health care provider may also give you more specific instructions. Your treatment has been planned according to current medical practices, but problems sometimes occur. Call your health care provider if you have any problems or questions after your procedure. WHAT TO EXPECT AFTER THE PROCEDURE After your procedure, it is typical to have the following:   Discomfort at the port insertion site. Ice packs to the area will help.  Bruising on the skin over the port. This will subside in 3-4 days. HOME CARE INSTRUCTIONS  After your port is placed, you will get a manufacturer's information card. The card has information about your port. Keep this card with you at all times.   Know what kind of port you have. There are many types of ports available.   Wear a medical alert bracelet in case of an emergency. This can help alert health care workers that you have a port.   The port can stay in for as long as your health care provider believes it is necessary.   A home health care nurse may give medicines and take care of the port.   You or a family member can get special training and directions for giving medicine and taking care of the port at home.  SEEK MEDICAL CARE IF:   Your port does not flush or you are unable to get a blood return.   You have a fever or chills. SEEK IMMEDIATE MEDICAL CARE IF:  You have new fluid or pus coming from your incision.   You notice a bad smell coming from your incision site.   You have swelling, pain, or more redness at the incision or port site.   You have chest pain or shortness of breath.   This information is not intended to replace advice given to you by your health care provider. Make sure you discuss any questions you have with your health care provider.   Document  Released: 07/01/2013 Document Revised: 09/15/2013 Document Reviewed: 07/01/2013 Elsevier Interactive Patient Education 2016 [place].     Biopsy Discharge Instructions  The procedure you just had is called a biopsy.  You may feel some discomfort after the local anesthetic wears off.  Your discomfort should improve over the next several days.  AFTER YOUR BIOPSY  Rest for the remainder of the day.  Avoid heavy lifting (more than 10 lb/4.5 kg).  If you have been given a general anesthetic or other medications to help you relax, you should not operate machinery, drive or make legal decisions for 24 hours after your procedure.  Additionally, someone must be available to drive you home.  Only take over-the-counter or prescription medicines for pain, discomfort, or fever as directed by your caregiver.  This can make bleeding worse.  You may resume your usual diet after the procedure.  Avoid alcoholic beverages for 24 hours after your procedure.  Keep the skin around your biopsy site clean and dry.  You may shower after 24 hours.  Cleanse and dry the biopsy site completely after you shower.  Avoid baths and swimming for 72 hours.  Complications are very uncommon after this procedure.  Go to the nearest Emergency Department or contact your caregiver if you develop any of the following symptoms:  Worsening pain  Bleeding  Swelling at the biopsy site  Light headedness or dizziness  Shortness of Breath  Fever or chills  Redness or increased pain or swelling at the biopsy site

## 2016-04-20 NOTE — Procedures (Signed)
Interventional Radiology Procedure Note  Procedure: US guided biopsy of right supraclavicular node.   Complications: None Recommendations:  - Ok to shower tomorrow - Do not submerge for 7 days - Routine wound care   Signed,  Dulcy Fanny. Earleen Newport, DO

## 2016-04-20 NOTE — Progress Notes (Signed)
Per Dr. Benay Spice, pt to begin potassium 20 meq daily d/t K-2.7 from today's lab results.  Pt took 40 meq of potassium prior to port placement today.  Pt notified of MD instructions and verbalizes an understanding of information.

## 2016-04-20 NOTE — Sedation Documentation (Signed)
cbg 92 

## 2016-04-20 NOTE — Sedation Documentation (Signed)
Dr. Earleen Newport returns to the bedside for port placement

## 2016-04-20 NOTE — H&P (Signed)
Chief Complaint: gastric adenocarcinoma  Referring Physician:Dr. Izola Price. Kiester  Supervising Physician: Corrie Mckusick  Patient Status: Out-pt  HPI: Robert Beck is an 55 y.o. 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  He has seen Dr. Benay Spice who has requested a biopsy of one of his lymph nodes to confirm metastatic disease and for placement of a port a cath so he can start chemotherapy.  The patient is feeling otherwise healthy.  He had some slight throat pain several days ago, but only lasted for a day.  He denies any other infectious symptoms.     Past Medical History:  Past Medical History:  Diagnosis Date  . Anemia   . Asthma   . COPD (chronic obstructive pulmonary disease) ([place])   . Depression   . Gout   . Hyperlipidemia   . Hypertension   . Iron deficiency anemia   . Ischemia   . Osteoarthritis   . RLS (restless legs syndrome)   . Type 2 diabetes, controlled, with neuropathy ([place])   . Vitamin B12 deficiency     Past Surgical History:  Past Surgical History:  Procedure Laterality Date  . ANKLE FRACTURE SURGERY    . CIRCUMCISION  2008  . COLONOSCOPY    . KNEE SURGERY    . NOSE SURGERY    . TONGUE SURGERY  2007   small growth removed from underneath his tongue, not cancerous.   [facility] UPPER GASTROINTESTINAL ENDOSCOPY      Family History:  Family History  Problem Relation Age of Onset  . Leukemia Mother   . Breast cancer Mother   . Heart disease Father   . Colon cancer Neg Hx   . Colon polyps Neg Hx   . Esophageal cancer Neg Hx   . Rectal cancer Neg Hx   . Stomach cancer Neg Hx     Social History:  reports that he quit smoking about 13 years ago. He has never used smokeless tobacco. He reports that he drinks alcohol. He reports that he does not use drugs.  Allergies: No Known Allergies  Medications:   Medication List    Notice   Cannot display  patient medications because the patient has not yet been checked in.     Please HPI for pertinent positives, otherwise complete 10 system ROS negative.  Mallampati Score: MD Evaluation Airway: WNL Heart: WNL Abdomen: WNL Chest/ Lungs: WNL ASA  Classification: 3 Mallampati/Airway Score: One  Physical Exam: Vitals pending  General: pleasant, WD, WN white male who is laying in bed in NAD HEENT: head is normocephalic, atraumatic.  Sclera are noninjected.  PERRL.  Ears and nose without any masses or lesions.  Mouth is pink and moist Heart: regular, rate, and rhythm.  Normal s1,s2. No obvious murmurs, gallops, or rubs noted.  Palpable radial and pedal pulses bilaterally Lungs: CTAB, no wheezes, rhonchi, or rales noted.  Respiratory effort nonlabored Abd: soft, NT, ND, +BS, no masses, hernias, or organomegaly MS: all 4 extremities are symmetrical with no cyanosis, clubbing, or edema. Skin: warm but slightly clammy from hypoglycemia with no masses, lesions, or rashes Psych: A&Ox3 with an appropriate affect.   Labs: Results for orders placed or performed during the hospital encounter of 04/20/16 (from the past 48 hour(s))  Glucose, capillary     Status: Abnormal   Collection Time: 04/20/16 11:24 AM  Result Value Ref Range  Glucose-Capillary 51 (L) 65 - 99 mg/dL  Basic metabolic panel     Status: Abnormal   Collection Time: 04/20/16 11:49 AM  Result Value Ref Range   Sodium 145 135 - 145 mmol/L   Potassium 2.7 (LL) 3.5 - 5.1 mmol/L    Comment: CRITICAL RESULT CALLED TO, READ BACK BY AND VERIFIED WITH: C.YARBOROUGH,RN 1234 04/20/16 CLARK,S    Chloride 113 (H) 101 - 111 mmol/L   CO2 25 22 - 32 mmol/L   Glucose, Bld 48 (L) 65 - 99 mg/dL   BUN 11 6 - 20 mg/dL   Creatinine, Ser 0.65 0.61 - 1.24 mg/dL   Calcium 9.6 8.9 - 10.3 mg/dL   GFR calc non Af Amer >60 >60 mL/min   GFR calc Af Amer >60 >60 mL/min    Comment: (NOTE) The eGFR has been calculated using the CKD EPI  equation. This calculation has not been validated in all clinical situations. eGFR's persistently <60 mL/min signify possible Chronic Kidney Disease.    Anion gap 7 5 - 15  CBC with Differential/Platelet     Status: Abnormal   Collection Time: 04/20/16 11:49 AM  Result Value Ref Range   WBC 14.5 (H) 4.0 - 10.5 K/uL   RBC 4.96 4.22 - 5.81 MIL/uL   Hemoglobin 11.2 (L) 13.0 - 17.0 g/dL   HCT 37.7 (L) 39.0 - 52.0 %   MCV 76.0 (L) 78.0 - 100.0 fL   MCH 22.6 (L) 26.0 - 34.0 pg   MCHC 29.7 (L) 30.0 - 36.0 g/dL   RDW 14.8 11.5 - 15.5 %   Platelets 474 (H) 150 - 400 K/uL   Neutrophils Relative % 74 %   Neutro Abs 10.7 (H) 1.7 - 7.7 K/uL   Lymphocytes Relative 17 %   Lymphs Abs 2.5 0.7 - 4.0 K/uL   Monocytes Relative 8 %   Monocytes Absolute 1.2 (H) 0.1 - 1.0 K/uL   Eosinophils Relative 1 %   Eosinophils Absolute 0.1 0.0 - 0.7 K/uL   Basophils Relative 0 %   Basophils Absolute 0.0 0.0 - 0.1 K/uL  Protime-INR     Status: None   Collection Time: 04/20/16 11:49 AM  Result Value Ref Range   Prothrombin Time 14.3 11.4 - 15.2 seconds   INR 1.10   Glucose, capillary     Status: None   Collection Time: 04/20/16 12:07 PM  Result Value Ref Range   Glucose-Capillary 92 65 - 99 mg/dL    Imaging: No results found.  Assessment/Plan 1. Gastric adenocarcinoma with lymphadenopathy -we will proceed with his US guided bx of a right neck lymph node and placement of a PAC -replace his K as it is 2.7 -I have d/w Dr. Benay Spice about his WBC of 14.5.  It was 11.9 in June.  He denies any infectious symptoms. -Risks and Benefits discussed with the patient including, but not limited to bleeding, infection, damage to adjacent structures or low yield requiring additional tests. All of the patient's questions were answered, patient is agreeable to proceed. Consent signed and in chart. Risks and Benefits discussed with the patient including, but not limited to bleeding, infection, pneumothorax, or fibrin  sheath development and need for additional procedures. All of the patient's questions were answered, patient is agreeable to proceed. Consent signed and in chart.  Thank you for this interesting consult.  I greatly enjoyed meeting Emerald Gehres and look forward to participating in their care.  A copy of this report was sent to the requesting  provider on this date.  Electronically Signed: Henreitta Cea 04/20/2016, 1:04 PM   I spent a total of  40 Minutes   in face to face in clinical consultation, greater than 50% of which was counseling/coordinating care for gastric adenocarcinoma

## 2016-04-20 NOTE — Procedures (Signed)
Interventional Radiology Procedure Note  Procedure: Placement of a right IJ approach single lumen PowerPort.  Tip is positioned at the superior cavoatrial junction and catheter is ready for immediate use.  Complications: No immediate Recommendations:  - Ok to shower tomorrow - Do not submerge for 7 days - Routine line care   Signed,  Quiara Killian S. Eduin Friedel, DO    

## 2016-04-23 ENCOUNTER — Telehealth: Payer: Self-pay | Admitting: Oncology

## 2016-04-23 ENCOUNTER — Encounter: Payer: Self-pay | Admitting: Pharmacist

## 2016-04-23 NOTE — Progress Notes (Signed)
Oral Chemotherapy Pharmacist Encounter  Received Rx for Xeloda 1000mg  PO BID to be given concurrently with Oxaliplatin for stage IV gastric adenocarcinoma.  CBC and bmet ok for Xeloda from 7/28, no cmet, noted it is to be performed at next OV with Dr. Benay Spice on 8/2.  Ran medication list for DDI's: interaction with pantoprazole risk C identified. Data is conflicting about degree of interaction. Patient placed on PPI BID due to ulceration and oozing of the gastric mucosa seen on upper endoscopy on 04/05/16. D/w Dr. Benay Spice, at start of Xeloda, will have patient decrease PPI from BID to daily when initial counseling is performed.  Copy of Rx given to [facility] from [facility] for benefits analysis and prescription filling.  Oral chemotherapy clinic will follow-up.  Johny Drilling, PharmD, BCPS Oral Chemotherapy Clinic

## 2016-04-23 NOTE — Telephone Encounter (Signed)
lvm to inform pt of added lab appt to 8/2 visit per pof

## 2016-04-25 ENCOUNTER — Other Ambulatory Visit: Payer: Self-pay | Admitting: *Deleted

## 2016-04-25 ENCOUNTER — Other Ambulatory Visit: Payer: BLUE CROSS/BLUE SHIELD

## 2016-04-25 ENCOUNTER — Ambulatory Visit (HOSPITAL_BASED_OUTPATIENT_CLINIC_OR_DEPARTMENT_OTHER): Payer: BLUE CROSS/BLUE SHIELD | Admitting: Oncology

## 2016-04-25 ENCOUNTER — Other Ambulatory Visit (HOSPITAL_BASED_OUTPATIENT_CLINIC_OR_DEPARTMENT_OTHER): Payer: BLUE CROSS/BLUE SHIELD

## 2016-04-25 ENCOUNTER — Encounter: Payer: Self-pay | Admitting: Pharmacist

## 2016-04-25 ENCOUNTER — Telehealth: Payer: Self-pay | Admitting: *Deleted

## 2016-04-25 ENCOUNTER — Telehealth: Payer: Self-pay | Admitting: Oncology

## 2016-04-25 ENCOUNTER — Other Ambulatory Visit: Payer: Self-pay | Admitting: Pharmacist

## 2016-04-25 ENCOUNTER — Encounter: Payer: Self-pay | Admitting: *Deleted

## 2016-04-25 VITALS — BP 122/80 | HR 112 | Temp 98.9°F | Resp 18 | Ht 66.0 in | Wt 153.1 lb

## 2016-04-25 DIAGNOSIS — I10 Essential (primary) hypertension: Secondary | ICD-10-CM

## 2016-04-25 DIAGNOSIS — R0789 Other chest pain: Secondary | ICD-10-CM | POA: Diagnosis not present

## 2016-04-25 DIAGNOSIS — C160 Malignant neoplasm of cardia: Secondary | ICD-10-CM | POA: Diagnosis not present

## 2016-04-25 DIAGNOSIS — D630 Anemia in neoplastic disease: Secondary | ICD-10-CM

## 2016-04-25 DIAGNOSIS — E785 Hyperlipidemia, unspecified: Secondary | ICD-10-CM

## 2016-04-25 DIAGNOSIS — E119 Type 2 diabetes mellitus without complications: Secondary | ICD-10-CM

## 2016-04-25 DIAGNOSIS — R070 Pain in throat: Secondary | ICD-10-CM

## 2016-04-25 DIAGNOSIS — M109 Gout, unspecified: Secondary | ICD-10-CM

## 2016-04-25 DIAGNOSIS — G893 Neoplasm related pain (acute) (chronic): Secondary | ICD-10-CM

## 2016-04-25 LAB — COMPREHENSIVE METABOLIC PANEL
ALBUMIN: 3.7 g/dL (ref 3.5–5.0)
ALK PHOS: 74 U/L (ref 40–150)
ALT: 10 U/L (ref 0–55)
AST: 13 U/L (ref 5–34)
Anion Gap: 11 mEq/L (ref 3–11)
BILIRUBIN TOTAL: 0.34 mg/dL (ref 0.20–1.20)
BUN: 11.4 mg/dL (ref 7.0–26.0)
CO2: 25 meq/L (ref 22–29)
Calcium: 10.2 mg/dL (ref 8.4–10.4)
Chloride: 109 mEq/L (ref 98–109)
Creatinine: 0.9 mg/dL (ref 0.7–1.3)
GLUCOSE: 174 mg/dL — AB (ref 70–140)
Potassium: 3.6 mEq/L (ref 3.5–5.1)
SODIUM: 145 meq/L (ref 136–145)
TOTAL PROTEIN: 7.6 g/dL (ref 6.4–8.3)

## 2016-04-25 MED ORDER — CAPECITABINE 500 MG PO TABS: 1000.0000 mg | ORAL_TABLET | Freq: Two times a day (BID) | ORAL | 0 refills | Status: DC

## 2016-04-25 MED ORDER — PROCHLORPERAZINE MALEATE 10 MG PO TABS: 10.0000 mg | ORAL_TABLET | Freq: Four times a day (QID) | ORAL | 1 refills | Status: DC | PRN

## 2016-04-25 NOTE — Telephone Encounter (Signed)
left msg confirming 8/7 apt time °

## 2016-04-25 NOTE — Telephone Encounter (Signed)
Per Dr. Benay Spice: Left message on voicemail at Dr. Keane Police office requesting they contact pt to discuss diabetes management.  Pt is on several diabetes meds and has had low blood sugars recently.

## 2016-04-25 NOTE — Progress Notes (Signed)
  [place] OFFICE PROGRESS NOTE   Diagnosis: Gastric cancer      INTERVAL HISTORY:   Robert Beck returns as scheduled. He underwent placement of a Port-A-Cath and biopsy of a right supraclavicular lymph node on 04/20/2016. He continues to have anterior chest discomfort. He tried oxycodone and developed a sore throat after this. He wonders whether the sore throat is related to oxycodone. He is now using hydrocodone for pain. He reports several episodes of hypoglycemia over the past week.  The biopsy from the supra-clavicular lymph node revealed metastatic adenocarcinoma. Foundation 1 testing is pending.  Objective:  Vital signs in last 24 hours:  Blood pressure 122/80, pulse (!) 112, temperature 98.9 F (37.2 C), temperature source Oral, resp. rate 18, height 5\' 6"  (1.676 m), weight 153 lb 1.6 oz (69.4 kg), SpO2 100 %.    HEENT: Pharynx without erythema or exudate, no thrush Lymphatics: No palpable supraclavicular node Resp: Lungs clear bilaterally Cardio: Regular rate and rhythm GI: No hepatosplenomegaly, nontender Vascular: No leg edema     Portacath/PICC-without erythema  Lab Results:  Lab Results  Component Value Date   WBC 14.5 (H) 04/20/2016   HGB 11.2 (L) 04/20/2016   HCT 37.7 (L) 04/20/2016   MCV 76.0 (L) 04/20/2016   PLT 474 (H) 04/20/2016   NEUTROABS 10.7 (H) 04/20/2016     Medications: I have reviewed the patient's current medications.  Assessment/Plan: 1. Gastric cancer, gastric cardia mass standing to the GE junction confirmed on endoscopy 04/11/2016, biopsy confirmed adenocarcinoma ? Staging CTs of the chest, abdomen, and pelvis 04/09/2016-gastric cardia mass, mediastinal, right hilar, and right supraclavicular lymphadenopathy ? Biopsy of a right supraclavicular lymph node on 04/20/2016 revealed metastatic adenocarcinoma 2. 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   Diabetes  4.   Gout  5.   Hypertension  6.   Hyperlipidemia  7.    History of a T7 compression fracture  8.   Pain secondary to the gastric mass and mediastinal lymphadenopathy  9.   Port-A-Cath placement 04/20/2016    Disposition:  Robert Beck has been diagnosed with metastatic gastric cancer. His case was presented at the GI tumor conference earlier today. Radiation is not recommended. I recommend systemic chemotherapy.  He lives a distance from the [employer] CAPOX chemotherapy will be most convenient for him. We reviewed the potential toxicities associated with this regimen including the chance for nausea/vomiting, mucositis, diarrhea, alopecia, and hematologic toxicity. We discussed the sun sensitivity, rash, hyperpigmentation, and hand/foot syndrome associated with capecitabine. We reviewed the allergic reaction and various types of neuropathy seen with oxaliplatin. He agrees to proceed.  Robert Beck will be scheduled for a first cycle of chemotherapy on 04/30/2016. He will return for an office visit and CBC on 05/14/2016.  The sore throat may be related to reflux symptoms. He will continue antiacid therapy.  He has lost weight and will likely require less intensive therapy for diabetes. He reports several episodes of hypoglycemia. We will contact Dr. Virgina Jock to adjust the diabetic regimen. I also recommend discontinuing several of his medications not related to the gastric cancer diagnosis.  Betsy Coder, MD  04/25/2016  12:01 PM

## 2016-04-25 NOTE — Telephone Encounter (Signed)
Message from DJ at [facility] stating they will contact pt with an appt.

## 2016-04-25 NOTE — Progress Notes (Signed)
Oral Chemotherapy Pharmacist Encounter   Received approved prior authorization for Xeloda 1000mg  PO BID from covermymeds.com Key [id number] PA case ID: [id number] Will re-try script at [place], PharmD, [facility]

## 2016-04-26 ENCOUNTER — Encounter: Payer: Self-pay | Admitting: *Deleted

## 2016-04-26 ENCOUNTER — Encounter: Payer: Self-pay | Admitting: Pharmacist

## 2016-04-26 ENCOUNTER — Other Ambulatory Visit: Payer: Self-pay | Admitting: Pharmacist

## 2016-04-26 DIAGNOSIS — C160 Malignant neoplasm of cardia: Secondary | ICD-10-CM

## 2016-04-26 MED ORDER — CAPECITABINE 500 MG PO TABS: 1000.0000 mg | ORAL_TABLET | Freq: Two times a day (BID) | ORAL | 0 refills | Status: DC

## 2016-04-26 NOTE — Progress Notes (Signed)
[place] Psychosocial Distress Screening Clinical Social Work  Clinical Social Work was referred by distress screening protocol.  The patient scored a 7 on the Psychosocial Distress Thermometer which indicates severe distress. Clinical Social Worker phoned pt to check in and  to assess for distress and other psychosocial needs. CSW left message and will follow up with patient at chemo on 04/30/16. CSW also referred for assistance with ss disability. Pt will need to work on in his home state of [place], but CSW will assist at next appt.   ONCBCN DISTRESS SCREENING 04/17/2016  Screening Type Initial Screening  Distress experienced in past week (1-10) 7  Practical problem type Insurance;Transportation;Food  Emotional problem type Depression;Nervousness/Anxiety;Adjusting to illness;Isolation/feeling alone;Feeling hopeless;Boredom  Spiritual/Religous concerns type Relating to God;Loss of Faith;Facing my mortality;Loss of sense of purpose  Information Concerns Type Lack of info about diagnosis;Lack of info about treatment;Lack of info about complementary therapy choices;Lack of info about maintaining fitness  Physical Problem type Pain;Nausea/vomiting;Sleep/insomnia;Getting around;Bathing/dressing;Mouth sores/swallowing;Changes in urination  Physician notified of physical symptoms Yes    Clinical Social Worker follow up needed: Yes.    If yes, follow up plan: See above Loren Racer, [place] Worker [place]  [facility] Phone: [phone] Fax: [phone]

## 2016-04-26 NOTE — Progress Notes (Signed)
Oral Chemotherapy Pharmacist Encounter   Received notification from [facility] that capecitabine Rx must go through specialty pharmacy. [facility].  Johny Drilling, PharmD, BCPS Oral Chemotherapy Clinic

## 2016-04-27 ENCOUNTER — Other Ambulatory Visit: Payer: Self-pay | Admitting: Pharmacist

## 2016-04-27 DIAGNOSIS — C160 Malignant neoplasm of cardia: Secondary | ICD-10-CM

## 2016-04-27 MED ORDER — CAPECITABINE 500 MG PO TABS: 1000.0000 mg | ORAL_TABLET | Freq: Two times a day (BID) | ORAL | 0 refills | Status: DC

## 2016-04-27 NOTE — Progress Notes (Signed)
Oral Chemotherapy Pharmacist Encounter   Verified with [facility] the receipt of the Rx from capecitabine.  The Rx was received by [facility] and they will be reaching out to patient with co-pay and delivery information.  Johny Drilling, PharmD, BCPS Oral Chemotherapy Clinic

## 2016-04-29 ENCOUNTER — Other Ambulatory Visit: Payer: Self-pay | Admitting: Oncology

## 2016-04-30 ENCOUNTER — Encounter: Payer: Self-pay | Admitting: *Deleted

## 2016-04-30 ENCOUNTER — Other Ambulatory Visit: Payer: Self-pay | Admitting: Hematology

## 2016-04-30 ENCOUNTER — Ambulatory Visit (HOSPITAL_BASED_OUTPATIENT_CLINIC_OR_DEPARTMENT_OTHER): Payer: BLUE CROSS/BLUE SHIELD

## 2016-04-30 ENCOUNTER — Telehealth: Payer: Self-pay | Admitting: *Deleted

## 2016-04-30 VITALS — BP 133/82 | HR 97 | Temp 97.8°F | Resp 18

## 2016-04-30 DIAGNOSIS — Z5111 Encounter for antineoplastic chemotherapy: Secondary | ICD-10-CM

## 2016-04-30 DIAGNOSIS — C160 Malignant neoplasm of cardia: Secondary | ICD-10-CM | POA: Diagnosis not present

## 2016-04-30 MED ORDER — OXALIPLATIN CHEMO INJECTION 100 MG/20ML
130.0000 mg/m2 | Freq: Once | INTRAVENOUS | Status: AC
  Administered 2016-04-30: 235 mg via INTRAVENOUS
  Filled 2016-04-30: qty 40

## 2016-04-30 MED ORDER — PALONOSETRON HCL INJECTION 0.25 MG/5ML
0.2500 mg | Freq: Once | INTRAVENOUS | Status: AC
  Administered 2016-04-30: 0.25 mg via INTRAVENOUS

## 2016-04-30 MED ORDER — DEXTROSE 5 % IV SOLN
Freq: Once | INTRAVENOUS | Status: AC
  Administered 2016-04-30: 15:00:00 via INTRAVENOUS

## 2016-04-30 MED ORDER — SODIUM CHLORIDE 0.9 % IV SOLN
10.0000 mg | Freq: Once | INTRAVENOUS | Status: AC
  Administered 2016-04-30: 10 mg via INTRAVENOUS
  Filled 2016-04-30: qty 1

## 2016-04-30 MED ORDER — PALONOSETRON HCL INJECTION 0.25 MG/5ML
INTRAVENOUS | Status: AC
  Filled 2016-04-30: qty 5

## 2016-04-30 MED ORDER — SODIUM CHLORIDE 0.9% FLUSH
10.0000 mL | INTRAVENOUS | Status: DC | PRN
  Administered 2016-04-30: 10 mL
  Filled 2016-04-30: qty 10

## 2016-04-30 MED ORDER — HEPARIN SOD (PORK) LOCK FLUSH 100 UNIT/ML IV SOLN
500.0000 [IU] | Freq: Once | INTRAVENOUS | Status: AC | PRN
  Administered 2016-04-30: 500 [IU]
  Filled 2016-04-30: qty 5

## 2016-04-30 NOTE — Progress Notes (Signed)
Per pharmacy and Dr Burr Medico, oxaliplatin dose was stopped at 153mg  / 387ml.

## 2016-04-30 NOTE — Telephone Encounter (Signed)
Message from pt's wife reporting the Xeloda prescription was too expensive. Returned call, they have not heard from pharmacy yet, she looked on the website for copay information. Called [facility], pt's co-pay will be $720. They will need to call to set up delivery. Called pt with this information, they are unable to afford this co-pay. Asks about any grants or co-pay assistance programs.

## 2016-04-30 NOTE — Progress Notes (Signed)
Oncology Nurse Navigator Documentation  Oncology Nurse Navigator Flowsheets 04/30/2016  Navigator Location [facility]Med Onc  Navigator Encounter Type Treatment  Abnormal Finding Date -  Confirmed Diagnosis Date -  Treatment Initiated Date 04/30/2016  Patient Visit Type MedOnc  Treatment Phase First Chemo Tx--CAPEOX (no Xeloda-can't afford)  Barriers/Navigation Needs Financial--managed care at [facility] working on authorization for infusional 5FU   Interventions Sandie Ano is off today and is off Wednesday this week and could bring him for pump d/c. If she knows far enough ahead of time, she can ask for certain day off for his treatment day. She works 3-11 or 7-3 shifts. Informed her we can work with her on the pump d/c time to make it before she goes to work or after her shift is over for future appointments. Instructed her to work on getting 8/28 off and finding out what shift she will be working on 8/30.   Referrals -  Coordination of Care Other--called his insurance provider and confirmed he does have home health benefits.   Education Method -  Support Groups/Services -  Acuity Level 2  Time Spent with Patient Gorst in [place] to inquire if they can d/c chemo infusion pump and flush port and deaccess patient every 2 weeks. None are allowed to do this per policy. Called Lutricia Horsfall [phone]: Jinny Blossom will ask her nurse manager and call back.

## 2016-04-30 NOTE — Telephone Encounter (Signed)
No patient assistance funding available for Xeloda. Reviewed with Dr. Burr Medico (Dr. Benay Spice out of the office.) We can add Leucovorin 5/FU if cleared by insurance. Spoke with Theadora Rama at [facility] who will look into it.  BC[employer] requires prior auth, we are unable to get this today. Dr. Burr Medico spoke with pt and S/O regarding treatment plan. They understand we will call them with appointment changes.

## 2016-04-30 NOTE — Patient Instructions (Addendum)
[facility] Discharge Instructions for Patients Receiving Chemotherapy  Today you received the following chemotherapy agents: Oxaliplatin.  To help prevent nausea and vomiting after your treatment, we encourage you to take your nausea medication as prescribed.  If you develop nausea and vomiting that is not controlled by your nausea medication, call the clinic.   BELOW ARE SYMPTOMS THAT SHOULD BE REPORTED IMMEDIATELY:  *FEVER GREATER THAN 100.5 F  *CHILLS WITH OR WITHOUT FEVER  NAUSEA AND VOMITING THAT IS NOT CONTROLLED WITH YOUR NAUSEA MEDICATION  *UNUSUAL SHORTNESS OF BREATH  *UNUSUAL BRUISING OR BLEEDING  TENDERNESS IN MOUTH AND THROAT WITH OR WITHOUT PRESENCE OF ULCERS  *URINARY PROBLEMS  *BOWEL PROBLEMS  UNUSUAL RASH Items with * indicate a potential emergency and should be followed up as soon as possible.  Feel free to call the clinic you have any questions or concerns. The clinic phone number is [phone].  Please show the [facility] at check-in to the Emergency Department and triage nurse.  Oxaliplatin Injection What is this medicine? OXALIPLATIN (ox AL i PLA tin) is a chemotherapy drug. It targets fast dividing cells, like cancer cells, and causes these cells to die. This medicine is used to treat cancers of the colon and rectum, and many other cancers. This medicine may be used for other purposes; ask your health care provider or pharmacist if you have questions. What should I tell my health care provider before I take this medicine? They need to know if you have any of these conditions: -kidney disease -an unusual or allergic reaction to oxaliplatin, other chemotherapy, other medicines, foods, dyes, or preservatives -pregnant or trying to get pregnant -breast-feeding How should I use this medicine? This drug is given as an infusion into a vein. It is administered in a hospital or clinic by a specially trained health care  professional. Talk to your pediatrician regarding the use of this medicine in children. Special care may be needed. Overdosage: If you think you have taken too much of this medicine contact a poison control center or emergency room at once. NOTE: This medicine is only for you. Do not share this medicine with others. What if I miss a dose? It is important not to miss a dose. Call your doctor or health care professional if you are unable to keep an appointment. What may interact with this medicine? -medicines to increase blood counts like filgrastim, pegfilgrastim, sargramostim -probenecid -some antibiotics like amikacin, gentamicin, neomycin, polymyxin B, streptomycin, tobramycin -zalcitabine Talk to your doctor or health care professional before taking any of these medicines: -acetaminophen -aspirin -ibuprofen -ketoprofen -naproxen This list may not describe all possible interactions. Give your health care provider a list of all the medicines, herbs, non-prescription drugs, or dietary supplements you use. Also tell them if you smoke, drink alcohol, or use illegal drugs. Some items may interact with your medicine. What should I watch for while using this medicine? Your condition will be monitored carefully while you are receiving this medicine. You will need important blood work done while you are taking this medicine. This medicine can make you more sensitive to cold. Do not drink cold drinks or use ice. Cover exposed skin before coming in contact with cold temperatures or cold objects. When out in cold weather wear warm clothing and cover your mouth and nose to warm the air that goes into your lungs. Tell your doctor if you get sensitive to the cold. This drug may make you feel generally unwell. This  is not uncommon, as chemotherapy can affect healthy cells as well as cancer cells. Report any side effects. Continue your course of treatment even though you feel ill unless your doctor tells you  to stop. In some cases, you may be given additional medicines to help with side effects. Follow all directions for their use. Call your doctor or health care professional for advice if you get a fever, chills or sore throat, or other symptoms of a cold or flu. Do not treat yourself. This drug decreases your body's ability to fight infections. Try to avoid being around people who are sick. This medicine may increase your risk to bruise or bleed. Call your doctor or health care professional if you notice any unusual bleeding. Be careful brushing and flossing your teeth or using a toothpick because you may get an infection or bleed more easily. If you have any dental work done, tell your dentist you are receiving this medicine. Avoid taking products that contain aspirin, acetaminophen, ibuprofen, naproxen, or ketoprofen unless instructed by your doctor. These medicines may hide a fever. Do not become pregnant while taking this medicine. Women should inform their doctor if they wish to become pregnant or think they might be pregnant. There is a potential for serious side effects to an unborn child. Talk to your health care professional or pharmacist for more information. Do not breast-feed an infant while taking this medicine. Call your doctor or health care professional if you get diarrhea. Do not treat yourself. What side effects may I notice from receiving this medicine? Side effects that you should report to your doctor or health care professional as soon as possible: -allergic reactions like skin rash, itching or hives, swelling of the face, lips, or tongue -low blood counts - This drug may decrease the number of white blood cells, red blood cells and platelets. You may be at increased risk for infections and bleeding. -signs of infection - fever or chills, cough, sore throat, pain or difficulty passing urine -signs of decreased platelets or bleeding - bruising, pinpoint red spots on the skin, black,  tarry stools, nosebleeds -signs of decreased red blood cells - unusually weak or tired, fainting spells, lightheadedness -breathing problems -chest pain, pressure -cough -diarrhea -jaw tightness -mouth sores -nausea and vomiting -pain, swelling, redness or irritation at the injection site -pain, tingling, numbness in the hands or feet -problems with balance, talking, walking -redness, blistering, peeling or loosening of the skin, including inside the mouth -trouble passing urine or change in the amount of urine Side effects that usually do not require medical attention (report to your doctor or health care professional if they continue or are bothersome): -changes in vision -constipation -hair loss -loss of appetite -metallic taste in the mouth or changes in taste -stomach pain This list may not describe all possible side effects. Call your doctor for medical advice about side effects. You may report side effects to FDA at 1-800-FDA-1088. Where should I keep my medicine? This drug is given in a hospital or clinic and will not be stored at home. NOTE: This sheet is a summary. It may not cover all possible information. If you have questions about this medicine, talk to your doctor, pharmacist, or health care provider.    2016, Elsevier/Gold Standard. (2008-04-06 17:22:47)

## 2016-05-01 ENCOUNTER — Telehealth: Payer: Self-pay | Admitting: *Deleted

## 2016-05-01 ENCOUNTER — Encounter: Payer: Self-pay | Admitting: Oncology

## 2016-05-01 ENCOUNTER — Other Ambulatory Visit: Payer: Self-pay | Admitting: Oncology

## 2016-05-01 ENCOUNTER — Telehealth: Payer: Self-pay | Admitting: Oncology

## 2016-05-01 ENCOUNTER — Other Ambulatory Visit: Payer: Self-pay | Admitting: *Deleted

## 2016-05-01 ENCOUNTER — Encounter (HOSPITAL_COMMUNITY): Payer: Self-pay

## 2016-05-01 NOTE — Telephone Encounter (Signed)
Oncology Nurse Navigator Documentation  Oncology Nurse Navigator Flowsheets 05/01/2016  Navigator Location [facility]  Navigator Encounter Type Telephone  Telephone Incoming Call;Appt Confirmation/Clarification  Abnormal Finding Date -  Confirmed Diagnosis Date -  Treatment Initiated Date -  Patient Visit Type -  Treatment Phase Active Tx--FOLFOX-will receive 5FU pump on 05/02/16  Barriers/Navigation Needs Coordination of Care--prior auth completed on new chemo plans and MD notified to sign orders  Interventions Coordination of Care--confirmed w/MD he is OK to treat on 8/21(POF and message to chemo scheduler to move 8/28 tx and labs to 8/21.  Referrals -  Coordination of Care Appts--Barbara will make arrangements to get 8/21 off from work.  Education Method -  Support Groups/Services -Will inquire of financial advocate if he will qualify for a gas card to help with transportation costs.  Acuity -  Time Spent with Patient 15

## 2016-05-01 NOTE — Progress Notes (Unsigned)
Called patient and left voicemail regarding financial questions or concerns after speaking with SW. Advised SW I previously reached out to patient and never received a return phone call. Left my contact information on voicemail if patient interested in applying for assistance.

## 2016-05-01 NOTE — Telephone Encounter (Signed)
lvm to inform pt of 8/9 infusion appt at 830 am per pof

## 2016-05-02 ENCOUNTER — Ambulatory Visit (HOSPITAL_BASED_OUTPATIENT_CLINIC_OR_DEPARTMENT_OTHER): Payer: BLUE CROSS/BLUE SHIELD

## 2016-05-02 ENCOUNTER — Encounter: Payer: Self-pay | Admitting: Oncology

## 2016-05-02 ENCOUNTER — Encounter: Payer: Self-pay | Admitting: *Deleted

## 2016-05-02 VITALS — BP 117/68 | HR 98 | Temp 99.3°F | Resp 18

## 2016-05-02 DIAGNOSIS — Z5111 Encounter for antineoplastic chemotherapy: Secondary | ICD-10-CM

## 2016-05-02 DIAGNOSIS — C160 Malignant neoplasm of cardia: Secondary | ICD-10-CM | POA: Diagnosis not present

## 2016-05-02 MED ORDER — LEUCOVORIN CALCIUM INJECTION 350 MG
400.0000 mg/m2 | Freq: Once | INTRAMUSCULAR | Status: AC
  Administered 2016-05-02: 720 mg via INTRAVENOUS
  Filled 2016-05-02: qty 36

## 2016-05-02 MED ORDER — SODIUM CHLORIDE 0.9 % IV SOLN
2400.0000 mg/m2 | INTRAVENOUS | Status: DC
  Administered 2016-05-02: 4300 mg via INTRAVENOUS
  Filled 2016-05-02: qty 86

## 2016-05-02 MED ORDER — FLUOROURACIL CHEMO INJECTION 2.5 GM/50ML
400.0000 mg/m2 | Freq: Once | INTRAVENOUS | Status: AC
  Administered 2016-05-02: 700 mg via INTRAVENOUS
  Filled 2016-05-02: qty 14

## 2016-05-02 NOTE — Progress Notes (Signed)
[place] Work  Clinical Social Work was referred by Pension scheme manager and Art therapist for assessment of psychosocial needs.  Clinical Social Worker met with patient and significant other in infusion room to offer support and assess for needs.    Mr. Southgate shared his most immediate concern at this time is finances.  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  CSW will make referral to [facility] disability assistance to determine if they can assist patient in new application due to new cancer diagnosis.  CSW is unaware of additional financial assistance options- CSW will discuss with CSW team and made referral to financial advocate.  CSW and patient/significant other spent remainder of visit discussing emotional implications of diagnosis and possible coping skills.  CSW provided brief emotional support.  Patient's significant was tearful and discussed how this experience has been difficult.  CSW validated feelings of loss of control and fear- provided information on free counseling at cancer center.   Polo Riley, MSW, LCSW, OSW-C Clinical Social Worker [facility] [phone]

## 2016-05-02 NOTE — Patient Instructions (Signed)
[facility] Discharge Instructions for Patients Receiving Chemotherapy  Today you received the following chemotherapy agents:  Leucovorin, Fluorouracil   To help prevent nausea and vomiting after your treatment, we encourage you to take your nausea medications as prescribed.   If you develop nausea and vomiting that is not controlled by your nausea medication, call the clinic.   BELOW ARE SYMPTOMS THAT SHOULD BE REPORTED IMMEDIATELY:  *FEVER GREATER THAN 100.5 F  *CHILLS WITH OR WITHOUT FEVER  NAUSEA AND VOMITING THAT IS NOT CONTROLLED WITH YOUR NAUSEA MEDICATION  *UNUSUAL SHORTNESS OF BREATH  *UNUSUAL BRUISING OR BLEEDING  TENDERNESS IN MOUTH AND THROAT WITH OR WITHOUT PRESENCE OF ULCERS  *URINARY PROBLEMS  *BOWEL PROBLEMS  UNUSUAL RASH Items with * indicate a potential emergency and should be followed up as soon as possible.  Feel free to call the clinic you have any questions or concerns. The clinic phone number is [phone].  Please show the [facility] at check-in to the Emergency Department and triage nurse.  Fluorouracil, 5-FU injection What is this medicine? FLUOROURACIL, 5-FU (flure oh YOOR a sil) is a chemotherapy drug. It slows the growth of cancer cells. This medicine is used to treat many types of cancer like breast cancer, colon or rectal cancer, pancreatic cancer, and stomach cancer. This medicine may be used for other purposes; ask your health care provider or pharmacist if you have questions. What should I tell my health care provider before I take this medicine? They need to know if you have any of these conditions: -blood disorders -dihydropyrimidine dehydrogenase (DPD) deficiency -infection (especially a virus infection such as chickenpox, cold sores, or herpes) -kidney disease -liver disease -malnourished, poor nutrition -recent or ongoing radiation therapy -an unusual or allergic reaction to fluorouracil, other chemotherapy,  other medicines, foods, dyes, or preservatives -pregnant or trying to get pregnant -breast-feeding How should I use this medicine? This drug is given as an infusion or injection into a vein. It is administered in a hospital or clinic by a specially trained health care professional. Talk to your pediatrician regarding the use of this medicine in children. Special care may be needed. Overdosage: If you think you have taken too much of this medicine contact a poison control center or emergency room at once. NOTE: This medicine is only for you. Do not share this medicine with others. What if I miss a dose? It is important not to miss your dose. Call your doctor or health care professional if you are unable to keep an appointment. What may interact with this medicine? -allopurinol -cimetidine -dapsone -digoxin -hydroxyurea -leucovorin -levamisole -medicines for seizures like ethotoin, fosphenytoin, phenytoin -medicines to increase blood counts like filgrastim, pegfilgrastim, sargramostim -medicines that treat or prevent blood clots like warfarin, enoxaparin, and dalteparin -methotrexate -metronidazole -pyrimethamine -some other chemotherapy drugs like busulfan, cisplatin, estramustine, vinblastine -trimethoprim -trimetrexate -vaccines Talk to your doctor or health care professional before taking any of these medicines: -acetaminophen -aspirin -ibuprofen -ketoprofen -naproxen This list may not describe all possible interactions. Give your health care provider a list of all the medicines, herbs, non-prescription drugs, or dietary supplements you use. Also tell them if you smoke, drink alcohol, or use illegal drugs. Some items may interact with your medicine. What should I watch for while using this medicine? Visit your doctor for checks on your progress. This drug may make you feel generally unwell. This is not uncommon, as chemotherapy can affect healthy cells as well as cancer cells.  Report  any side effects. Continue your course of treatment even though you feel ill unless your doctor tells you to stop. In some cases, you may be given additional medicines to help with side effects. Follow all directions for their use. Call your doctor or health care professional for advice if you get a fever, chills or sore throat, or other symptoms of a cold or flu. Do not treat yourself. This drug decreases your body's ability to fight infections. Try to avoid being around people who are sick. This medicine may increase your risk to bruise or bleed. Call your doctor or health care professional if you notice any unusual bleeding. Be careful brushing and flossing your teeth or using a toothpick because you may get an infection or bleed more easily. If you have any dental work done, tell your dentist you are receiving this medicine. Avoid taking products that contain aspirin, acetaminophen, ibuprofen, naproxen, or ketoprofen unless instructed by your doctor. These medicines may hide a fever. Do not become pregnant while taking this medicine. Women should inform their doctor if they wish to become pregnant or think they might be pregnant. There is a potential for serious side effects to an unborn child. Talk to your health care professional or pharmacist for more information. Do not breast-feed an infant while taking this medicine. Men should inform their doctor if they wish to father a child. This medicine may lower sperm counts. Do not treat diarrhea with over the counter products. Contact your doctor if you have diarrhea that lasts more than 2 days or if it is severe and watery. This medicine can make you more sensitive to the sun. Keep out of the sun. If you cannot avoid being in the sun, wear protective clothing and use sunscreen. Do not use sun lamps or tanning beds/booths. What side effects may I notice from receiving this medicine? Side effects that you should report to your doctor or health  care professional as soon as possible: -allergic reactions like skin rash, itching or hives, swelling of the face, lips, or tongue -low blood counts - this medicine may decrease the number of white blood cells, red blood cells and platelets. You may be at increased risk for infections and bleeding. -signs of infection - fever or chills, cough, sore throat, pain or difficulty passing urine -signs of decreased platelets or bleeding - bruising, pinpoint red spots on the skin, black, tarry stools, blood in the urine -signs of decreased red blood cells - unusually weak or tired, fainting spells, lightheadedness -breathing problems -changes in vision -chest pain -mouth sores -nausea and vomiting -pain, swelling, redness at site where injected -pain, tingling, numbness in the hands or feet -redness, swelling, or sores on hands or feet -stomach pain -unusual bleeding Side effects that usually do not require medical attention (report to your doctor or health care professional if they continue or are bothersome): -changes in finger or toe nails -diarrhea -dry or itchy skin -hair loss -headache -loss of appetite -sensitivity of eyes to the light -stomach upset -unusually teary eyes This list may not describe all possible side effects. Call your doctor for medical advice about side effects. You may report side effects to FDA at 1-800-FDA-1088. Where should I keep my medicine? This drug is given in a hospital or clinic and will not be stored at home. NOTE: This sheet is a summary. It may not cover all possible information. If you have questions about this medicine, talk to your doctor, pharmacist, or health care provider.  2016, Elsevier/Gold Standard. (2008-01-14 13:53:16) Leucovorin injection What is this medicine? LEUCOVORIN (loo koe VOR in) is used to prevent or treat the harmful effects of some medicines. This medicine is used to treat anemia caused by a low amount of folic acid in the  body. It is also used with 5-fluorouracil (5-FU) to treat colon cancer. This medicine may be used for other purposes; ask your health care provider or pharmacist if you have questions. What should I tell my health care provider before I take this medicine? They need to know if you have any of these conditions: -anemia from low levels of vitamin B-12 in the blood -an unusual or allergic reaction to leucovorin, folic acid, other medicines, foods, dyes, or preservatives -pregnant or trying to get pregnant -breast-feeding How should I use this medicine? This medicine is for injection into a muscle or into a vein. It is given by a health care professional in a hospital or clinic setting. Talk to your pediatrician regarding the use of this medicine in children. Special care may be needed. Overdosage: If you think you have taken too much of this medicine contact a poison control center or emergency room at once. NOTE: This medicine is only for you. Do not share this medicine with others. What if I miss a dose? This does not apply. What may interact with this medicine? -capecitabine -fluorouracil -phenobarbital -phenytoin -primidone -trimethoprim-sulfamethoxazole This list may not describe all possible interactions. Give your health care provider a list of all the medicines, herbs, non-prescription drugs, or dietary supplements you use. Also tell them if you smoke, drink alcohol, or use illegal drugs. Some items may interact with your medicine. What should I watch for while using this medicine? Your condition will be monitored carefully while you are receiving this medicine. This medicine may increase the side effects of 5-fluorouracil, 5-FU. Tell your doctor or health care professional if you have diarrhea or mouth sores that do not get better or that get worse. What side effects may I notice from receiving this medicine? Side effects that you should report to your doctor or health care  professional as soon as possible: -allergic reactions like skin rash, itching or hives, swelling of the face, lips, or tongue -breathing problems -fever, infection -mouth sores -unusual bleeding or bruising -unusually weak or tired Side effects that usually do not require medical attention (report to your doctor or health care professional if they continue or are bothersome): -constipation or diarrhea -loss of appetite -nausea, vomiting This list may not describe all possible side effects. Call your doctor for medical advice about side effects. You may report side effects to FDA at 1-800-FDA-1088. Where should I keep my medicine? This drug is given in a hospital or clinic and will not be stored at home. NOTE: This sheet is a summary. It may not cover all possible information. If you have questions about this medicine, talk to your doctor, pharmacist, or health care provider.    2016, Elsevier/Gold Standard. (2008-03-16 16:50:29)

## 2016-05-02 NOTE — Progress Notes (Unsigned)
Met with patient and friend about his financial questions and concerns. Patient has very expensive insurance and is only living off of his savings. He hasn't worked in 96 years and his friend helps him financially. Approved patient for one-time $400 [place]. Patient has a copy of the award along with our Outpatient Pharmacy information as well as the expense sheet. Explained to patient how the grant works and how to utilize the pharmacy if he needs to. Gave patient ACS application to return to be faxed for additional resources. Also gave patient Cancer Care information on how to apply and what to expect from them. Cancer Care was the only resource which had funding available for his diagnosis as far as co-pay assistance . I explained to him how important it was to contact them in order to receive their application. Patient verbalized understanding. Last but not least I gave him a Medicaid application for [facility] to return to his local DSS since his primary concern was the amount of money he is paying for insurance. Patient also had questions on how to set up a payment plan. Advised patient he could contact patient accounting which the number would be listed on the bills once he starts to receive bills but the goal is to have to focus on that only if no other options are available.  Patient has my card for any additional financial questions or concerns.

## 2016-05-02 NOTE — Progress Notes (Signed)
Oncology Nurse Navigator Documentation  Oncology Nurse Navigator Flowsheets 05/02/2016  Navigator Location [facility]  Navigator Encounter Type Treatment  Telephone Appt Confirmation/Clarification  Abnormal Finding Date -  Confirmed Diagnosis Date -  Treatment Initiated Date -  Patient Visit Type MedOnc  Treatment Phase Active Tx--Having leucovorin and 5FU pump today  Barriers/Navigation Needs Financial--suggested he ask his financial advocate today if he would qualify for a gas card today after his tx  Interventions Other- instructed [name] to check with scheduler today before leaving for his next chemo appointment to coordinate with her work schedule. Reminded her that pump d/c time can be adjusted based on her schedule, except on Saturday.  Referrals -  Coordination of Care -  Education Method -  Support Groups/Services -  Acuity -  Time Spent with Patient 15  Robert Beck reports he has done well thus far with the Oxaliplatin-no nausea problem eating. Does have some cold sensitivity, but manageable.

## 2016-05-03 ENCOUNTER — Telehealth: Payer: Self-pay | Admitting: *Deleted

## 2016-05-03 NOTE — Telephone Encounter (Signed)
Call returned to [employer] to give diagnosis code and diagnosis of pt for assistance with co pay for xeloda.

## 2016-05-04 ENCOUNTER — Telehealth: Payer: Self-pay | Admitting: *Deleted

## 2016-05-04 ENCOUNTER — Ambulatory Visit (HOSPITAL_BASED_OUTPATIENT_CLINIC_OR_DEPARTMENT_OTHER): Payer: BLUE CROSS/BLUE SHIELD

## 2016-05-04 VITALS — BP 108/71 | Temp 99.2°F | Resp 20

## 2016-05-04 DIAGNOSIS — Z452 Encounter for adjustment and management of vascular access device: Secondary | ICD-10-CM | POA: Diagnosis not present

## 2016-05-04 DIAGNOSIS — C160 Malignant neoplasm of cardia: Secondary | ICD-10-CM

## 2016-05-04 MED ORDER — HEPARIN SOD (PORK) LOCK FLUSH 100 UNIT/ML IV SOLN
500.0000 [IU] | Freq: Once | INTRAVENOUS | Status: AC | PRN
  Administered 2016-05-04: 500 [IU]
  Filled 2016-05-04: qty 5

## 2016-05-04 MED ORDER — SODIUM CHLORIDE 0.9% FLUSH
10.0000 mL | INTRAVENOUS | Status: DC | PRN
  Administered 2016-05-04: 10 mL
  Filled 2016-05-04: qty 10

## 2016-05-04 NOTE — Telephone Encounter (Signed)
Per LOS ans staff voicemail I have scheduled appts. I have called Jimmy Footman she will pick up appts today.

## 2016-05-13 ENCOUNTER — Other Ambulatory Visit: Payer: Self-pay | Admitting: Oncology

## 2016-05-14 ENCOUNTER — Ambulatory Visit: Payer: BLUE CROSS/BLUE SHIELD

## 2016-05-14 ENCOUNTER — Ambulatory Visit (HOSPITAL_BASED_OUTPATIENT_CLINIC_OR_DEPARTMENT_OTHER): Payer: BLUE CROSS/BLUE SHIELD | Admitting: Oncology

## 2016-05-14 ENCOUNTER — Other Ambulatory Visit (HOSPITAL_BASED_OUTPATIENT_CLINIC_OR_DEPARTMENT_OTHER): Payer: BLUE CROSS/BLUE SHIELD

## 2016-05-14 ENCOUNTER — Other Ambulatory Visit: Payer: Self-pay | Admitting: *Deleted

## 2016-05-14 ENCOUNTER — Ambulatory Visit (HOSPITAL_BASED_OUTPATIENT_CLINIC_OR_DEPARTMENT_OTHER): Payer: BLUE CROSS/BLUE SHIELD

## 2016-05-14 ENCOUNTER — Telehealth: Payer: Self-pay | Admitting: Oncology

## 2016-05-14 VITALS — BP 144/81 | HR 79 | Temp 97.8°F | Resp 18 | Ht 66.0 in | Wt 158.2 lb

## 2016-05-14 DIAGNOSIS — R110 Nausea: Secondary | ICD-10-CM

## 2016-05-14 DIAGNOSIS — Z5111 Encounter for antineoplastic chemotherapy: Secondary | ICD-10-CM

## 2016-05-14 DIAGNOSIS — C160 Malignant neoplasm of cardia: Secondary | ICD-10-CM

## 2016-05-14 DIAGNOSIS — D630 Anemia in neoplastic disease: Secondary | ICD-10-CM

## 2016-05-14 DIAGNOSIS — G893 Neoplasm related pain (acute) (chronic): Secondary | ICD-10-CM | POA: Diagnosis not present

## 2016-05-14 DIAGNOSIS — E119 Type 2 diabetes mellitus without complications: Secondary | ICD-10-CM

## 2016-05-14 DIAGNOSIS — I10 Essential (primary) hypertension: Secondary | ICD-10-CM

## 2016-05-14 LAB — CBC WITH DIFFERENTIAL/PLATELET
BASO%: 0.2 % (ref 0.0–2.0)
BASOS ABS: 0 10*3/uL (ref 0.0–0.1)
EOS%: 3 % (ref 0.0–7.0)
Eosinophils Absolute: 0.2 10*3/uL (ref 0.0–0.5)
HEMATOCRIT: 28 % — AB (ref 38.4–49.9)
HGB: 8.5 g/dL — ABNORMAL LOW (ref 13.0–17.1)
LYMPH#: 1.5 10*3/uL (ref 0.9–3.3)
LYMPH%: 22.6 % (ref 14.0–49.0)
MCH: 24.1 pg — AB (ref 27.2–33.4)
MCHC: 30.4 g/dL — AB (ref 32.0–36.0)
MCV: 79.3 fL (ref 79.3–98.0)
MONO#: 0.5 10*3/uL (ref 0.1–0.9)
MONO%: 7.2 % (ref 0.0–14.0)
NEUT#: 4.3 10*3/uL (ref 1.5–6.5)
NEUT%: 67 % (ref 39.0–75.0)
Platelets: 287 10*3/uL (ref 140–400)
RBC: 3.53 10*6/uL — ABNORMAL LOW (ref 4.20–5.82)
RDW: 19.7 % — ABNORMAL HIGH (ref 11.0–14.6)
WBC: 6.4 10*3/uL (ref 4.0–10.3)

## 2016-05-14 LAB — COMPREHENSIVE METABOLIC PANEL
ALBUMIN: 3.2 g/dL — AB (ref 3.5–5.0)
ALK PHOS: 76 U/L (ref 40–150)
ALT: 15 U/L (ref 0–55)
ANION GAP: 7 meq/L (ref 3–11)
AST: 13 U/L (ref 5–34)
BUN: 10.5 mg/dL (ref 7.0–26.0)
CALCIUM: 9 mg/dL (ref 8.4–10.4)
CO2: 26 mEq/L (ref 22–29)
Chloride: 108 mEq/L (ref 98–109)
Creatinine: 0.8 mg/dL (ref 0.7–1.3)
Glucose: 181 mg/dl — ABNORMAL HIGH (ref 70–140)
POTASSIUM: 3.6 meq/L (ref 3.5–5.1)
SODIUM: 141 meq/L (ref 136–145)
Total Protein: 6.2 g/dL — ABNORMAL LOW (ref 6.4–8.3)

## 2016-05-14 MED ORDER — PALONOSETRON HCL INJECTION 0.25 MG/5ML
0.2500 mg | Freq: Once | INTRAVENOUS | Status: AC
  Administered 2016-05-14: 0.25 mg via INTRAVENOUS

## 2016-05-14 MED ORDER — LEUCOVORIN CALCIUM INJECTION 350 MG
400.0000 mg/m2 | Freq: Once | INTRAVENOUS | Status: AC
  Administered 2016-05-14: 720 mg via INTRAVENOUS
  Filled 2016-05-14: qty 25

## 2016-05-14 MED ORDER — ONDANSETRON HCL 8 MG PO TABS: 8.0000 mg | ORAL_TABLET | Freq: Three times a day (TID) | ORAL | 0 refills | Status: DC | PRN

## 2016-05-14 MED ORDER — FLUOROURACIL CHEMO INJECTION 2.5 GM/50ML
400.0000 mg/m2 | Freq: Once | INTRAVENOUS | Status: AC
  Administered 2016-05-14: 700 mg via INTRAVENOUS
  Filled 2016-05-14: qty 14

## 2016-05-14 MED ORDER — SODIUM CHLORIDE 0.9 % IV SOLN
Freq: Once | INTRAVENOUS | Status: AC
  Administered 2016-05-14: 14:00:00 via INTRAVENOUS
  Filled 2016-05-14: qty 5

## 2016-05-14 MED ORDER — OXALIPLATIN CHEMO INJECTION 100 MG/20ML
84.0000 mg/m2 | Freq: Once | INTRAVENOUS | Status: AC
  Administered 2016-05-14: 150 mg via INTRAVENOUS
  Filled 2016-05-14: qty 20

## 2016-05-14 MED ORDER — FLUOROURACIL CHEMO INJECTION 5 GM/100ML
2400.0000 mg/m2 | INTRAVENOUS | Status: DC
  Administered 2016-05-14: 4300 mg via INTRAVENOUS
  Filled 2016-05-14: qty 86

## 2016-05-14 MED ORDER — PALONOSETRON HCL INJECTION 0.25 MG/5ML
INTRAVENOUS | Status: AC
  Filled 2016-05-14: qty 5

## 2016-05-14 MED ORDER — DEXTROSE 5 % IV SOLN
Freq: Once | INTRAVENOUS | Status: AC
  Administered 2016-05-14: 14:00:00 via INTRAVENOUS

## 2016-05-14 NOTE — Patient Instructions (Signed)
[facility] Discharge Instructions for Patients Receiving Chemotherapy  Today you received the following chemotherapy agents: Oxaliplatin, Leucovorin and Adrucil.  To help prevent nausea and vomiting after your treatment, we encourage you to take your nausea medication as directed. No Zofran for 3 days. Take Compazine instead.    If you develop nausea and vomiting that is not controlled by your nausea medication, call the clinic.   BELOW ARE SYMPTOMS THAT SHOULD BE REPORTED IMMEDIATELY:  *FEVER GREATER THAN 100.5 F  *CHILLS WITH OR WITHOUT FEVER  NAUSEA AND VOMITING THAT IS NOT CONTROLLED WITH YOUR NAUSEA MEDICATION  *UNUSUAL SHORTNESS OF BREATH  *UNUSUAL BRUISING OR BLEEDING  TENDERNESS IN MOUTH AND THROAT WITH OR WITHOUT PRESENCE OF ULCERS  *URINARY PROBLEMS  *BOWEL PROBLEMS  UNUSUAL RASH Items with * indicate a potential emergency and should be followed up as soon as possible.  Feel free to call the clinic you have any questions or concerns. The clinic phone number is [phone].  Please show the CHEMO ALERT CARD at check-in to the Emergency Department and triage nurse.   

## 2016-05-14 NOTE — Progress Notes (Signed)
  [facility] OFFICE PROGRESS NOTE   Diagnosis: Gastric cancer  INTERVAL HISTORY:   He began chemotherapy 04/30/2016. He was unable to afford Xeloda secondary to poor insurance coverage. He returned on 89 and completed a course of infusional 5-FU.  Mr. Fickett has noted improvement in the chest pain. He was prescribed OxyContin Dr. Virgina Jock but is no longer taking this. He did have an episode of pain last night.  He had nausea and dry heaves beginning on approximately day 5 following chemotherapy. He had restless legs when he took Compazine. Cold sensitivity lasted 3-4 days. No other neuropathy symptoms. He is taking iron. No bleeding.  Objective:  Vital signs in last 24 hours:  Blood pressure (!) 144/81, pulse 79, temperature 97.8 F (36.6 C), temperature source Oral, resp. rate 18, height 5\' 6"  (1.676 m), weight 158 lb 3.2 oz (71.8 kg), SpO2 100 %.    HEENT: No thrush or ulcers Lymphatics: No supraclavicular nodes Resp: Lungs clear bilaterally Cardio: Regular rate and rhythm GI: No hepatosplenomegaly, nontender Vascular: No leg edema  Skin: Palms without erythema   Portacath/PICC-without erythema  Lab Results:  Lab Results  Component Value Date   WBC 6.4 05/14/2016   HGB 8.5 (L) 05/14/2016   HCT 28.0 (L) 05/14/2016   MCV 79.3 05/14/2016   PLT 287 05/14/2016   NEUTROABS 4.3 05/14/2016     Medications: I have reviewed the patient's current medications.  Assessment/Plan: 1. Gastric cancer, gastric cardia mass standing to the GE junction confirmed on endoscopy 04/11/2016, biopsy confirmed adenocarcinoma ? Staging CTs of the chest, abdomen, and pelvis 04/09/2016-gastric cardia mass, mediastinal, right hilar, and right supraclavicular lymphadenopathy ? 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 Cycle 1 FOLFOX 04/30/2016 (oxaliplatin given 04/30/2016, 5-FU infusion started 05/02/2016) ? Cycle 2 FOLFOX  05/14/2016 2. 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 Diabetes  4. Gout  5. Hypertension  6. Hyperlipidemia  7. History of a T7 compression fracture  8. Pain secondary to the gastric mass and mediastinal lymphadenopathy  9.   Port-A-Cath placement 04/20/2016  10. Delayed nausea following cycle 1 FOLFOX-emend added with cycle 2  Disposition:  Mr. Satkowiak completed one cycle of FOLFOX. He tolerated the chemotherapy well other than delayed nausea. Emend will be added with cycle 2. He had restless legs after Compazine. We will discontinue Compazine and he will try Zofran as needed for nausea. The hemoglobin has dropped significantly over the past few weeks. He will contact us for symptoms of anemia. He will begin ferrous sulfate twice daily.  Mr. Speier will be scheduled for an office visit and cycle 3 FOLFOX 05/30/2017.  Betsy Coder, MD  05/14/2016  10:37 AM

## 2016-05-14 NOTE — Telephone Encounter (Signed)
APPOINTMENTS COMPLETE PER 8/21 LOS. PATIENT TO GET PRINT OUT IN INFUSION AREA.

## 2016-05-15 ENCOUNTER — Encounter (HOSPITAL_COMMUNITY): Payer: Self-pay

## 2016-05-15 NOTE — Addendum Note (Signed)
Encounter addended by: Dahlia Byes on: 05/15/2016  2:54 PM<BR>    Actions taken: Imaging Exam ended

## 2016-05-16 ENCOUNTER — Ambulatory Visit (HOSPITAL_BASED_OUTPATIENT_CLINIC_OR_DEPARTMENT_OTHER): Payer: BLUE CROSS/BLUE SHIELD

## 2016-05-16 DIAGNOSIS — C160 Malignant neoplasm of cardia: Secondary | ICD-10-CM

## 2016-05-16 MED ORDER — SODIUM CHLORIDE 0.9% FLUSH
10.0000 mL | INTRAVENOUS | Status: DC | PRN
  Administered 2016-05-16: 10 mL
  Filled 2016-05-16: qty 10

## 2016-05-16 MED ORDER — HEPARIN SOD (PORK) LOCK FLUSH 100 UNIT/ML IV SOLN
500.0000 [IU] | Freq: Once | INTRAVENOUS | Status: AC | PRN
  Administered 2016-05-16: 500 [IU]
  Filled 2016-05-16: qty 5

## 2016-05-16 NOTE — Patient Instructions (Signed)

## 2016-05-21 ENCOUNTER — Other Ambulatory Visit: Payer: BLUE CROSS/BLUE SHIELD

## 2016-05-21 ENCOUNTER — Ambulatory Visit: Payer: BLUE CROSS/BLUE SHIELD

## 2016-05-28 ENCOUNTER — Other Ambulatory Visit: Payer: Self-pay | Admitting: Oncology

## 2016-05-30 ENCOUNTER — Telehealth: Payer: Self-pay | Admitting: Oncology

## 2016-05-30 ENCOUNTER — Ambulatory Visit: Payer: BLUE CROSS/BLUE SHIELD | Admitting: Nurse Practitioner

## 2016-05-30 ENCOUNTER — Ambulatory Visit (HOSPITAL_BASED_OUTPATIENT_CLINIC_OR_DEPARTMENT_OTHER): Payer: BLUE CROSS/BLUE SHIELD

## 2016-05-30 ENCOUNTER — Other Ambulatory Visit: Payer: Self-pay | Admitting: *Deleted

## 2016-05-30 ENCOUNTER — Ambulatory Visit (HOSPITAL_BASED_OUTPATIENT_CLINIC_OR_DEPARTMENT_OTHER): Payer: BLUE CROSS/BLUE SHIELD | Admitting: Oncology

## 2016-05-30 ENCOUNTER — Other Ambulatory Visit (HOSPITAL_BASED_OUTPATIENT_CLINIC_OR_DEPARTMENT_OTHER): Payer: BLUE CROSS/BLUE SHIELD

## 2016-05-30 VITALS — BP 152/82 | HR 91 | Temp 98.9°F | Resp 18 | Ht 66.0 in | Wt 157.2 lb

## 2016-05-30 DIAGNOSIS — D649 Anemia, unspecified: Secondary | ICD-10-CM

## 2016-05-30 DIAGNOSIS — Z5111 Encounter for antineoplastic chemotherapy: Secondary | ICD-10-CM | POA: Diagnosis not present

## 2016-05-30 DIAGNOSIS — C160 Malignant neoplasm of cardia: Secondary | ICD-10-CM

## 2016-05-30 DIAGNOSIS — I10 Essential (primary) hypertension: Secondary | ICD-10-CM | POA: Diagnosis not present

## 2016-05-30 DIAGNOSIS — E785 Hyperlipidemia, unspecified: Secondary | ICD-10-CM

## 2016-05-30 DIAGNOSIS — E119 Type 2 diabetes mellitus without complications: Secondary | ICD-10-CM | POA: Diagnosis not present

## 2016-05-30 LAB — COMPREHENSIVE METABOLIC PANEL
ALT: 16 U/L (ref 0–55)
AST: 17 U/L (ref 5–34)
Albumin: 2.9 g/dL — ABNORMAL LOW (ref 3.5–5.0)
Alkaline Phosphatase: 76 U/L (ref 40–150)
Anion Gap: 10 mEq/L (ref 3–11)
BUN: 5.6 mg/dL — ABNORMAL LOW (ref 7.0–26.0)
CHLORIDE: 111 meq/L — AB (ref 98–109)
CO2: 22 meq/L (ref 22–29)
Calcium: 7.9 mg/dL — ABNORMAL LOW (ref 8.4–10.4)
Creatinine: 0.7 mg/dL (ref 0.7–1.3)
Glucose: 281 mg/dl — ABNORMAL HIGH (ref 70–140)
Potassium: 3.5 mEq/L (ref 3.5–5.1)
Sodium: 143 mEq/L (ref 136–145)
Total Bilirubin: <0.3 mg/dL (ref 0.20–1.20)
Total Protein: 5.4 g/dL — ABNORMAL LOW (ref 6.4–8.3)

## 2016-05-30 LAB — CBC WITH DIFFERENTIAL/PLATELET
BASO%: 1.4 % (ref 0.0–2.0)
Basophils Absolute: 0.1 10*3/uL (ref 0.0–0.1)
EOS ABS: 0.2 10*3/uL (ref 0.0–0.5)
EOS%: 4.5 % (ref 0.0–7.0)
HCT: 34.6 % — ABNORMAL LOW (ref 38.4–49.9)
HGB: 10.8 g/dL — ABNORMAL LOW (ref 13.0–17.1)
LYMPH%: 23.3 % (ref 14.0–49.0)
MCH: 24.9 pg — AB (ref 27.2–33.4)
MCHC: 31.2 g/dL — AB (ref 32.0–36.0)
MCV: 79.6 fL (ref 79.3–98.0)
MONO#: 0.5 10*3/uL (ref 0.1–0.9)
MONO%: 9.8 % (ref 0.0–14.0)
NEUT#: 2.9 10*3/uL (ref 1.5–6.5)
NEUT%: 61 % (ref 39.0–75.0)
PLATELETS: 202 10*3/uL (ref 140–400)
RBC: 4.35 10*6/uL (ref 4.20–5.82)
RDW: 22.5 % — ABNORMAL HIGH (ref 11.0–14.6)
WBC: 4.7 10*3/uL (ref 4.0–10.3)
lymph#: 1.1 10*3/uL (ref 0.9–3.3)

## 2016-05-30 MED ORDER — DEXTROSE 5 % IV SOLN
Freq: Once | INTRAVENOUS | Status: AC
  Administered 2016-05-30: 11:00:00 via INTRAVENOUS

## 2016-05-30 MED ORDER — PALONOSETRON HCL INJECTION 0.25 MG/5ML
0.2500 mg | Freq: Once | INTRAVENOUS | Status: AC
Start: 2016-05-30 — End: 2016-05-30
  Administered 2016-05-30: 0.25 mg via INTRAVENOUS

## 2016-05-30 MED ORDER — OXALIPLATIN CHEMO INJECTION 100 MG/20ML
84.0000 mg/m2 | Freq: Once | INTRAVENOUS | Status: AC
  Administered 2016-05-30: 150 mg via INTRAVENOUS
  Filled 2016-05-30: qty 10

## 2016-05-30 MED ORDER — PALONOSETRON HCL INJECTION 0.25 MG/5ML
INTRAVENOUS | Status: AC
  Filled 2016-05-30: qty 5

## 2016-05-30 MED ORDER — FOSAPREPITANT DIMEGLUMINE INJECTION 150 MG
Freq: Once | INTRAVENOUS | Status: AC
  Administered 2016-05-30: 11:00:00 via INTRAVENOUS
  Filled 2016-05-30: qty 5

## 2016-05-30 MED ORDER — LEUCOVORIN CALCIUM INJECTION 350 MG
400.0000 mg/m2 | Freq: Once | INTRAVENOUS | Status: AC
  Administered 2016-05-30: 720 mg via INTRAVENOUS
  Filled 2016-05-30: qty 36

## 2016-05-30 MED ORDER — SODIUM CHLORIDE 0.9% FLUSH
10.0000 mL | Freq: Once | INTRAVENOUS | Status: AC
  Administered 2016-05-30: 10 mL via INTRAVENOUS
  Filled 2016-05-30: qty 10

## 2016-05-30 MED ORDER — FLUOROURACIL CHEMO INJECTION 2.5 GM/50ML
400.0000 mg/m2 | Freq: Once | INTRAVENOUS | Status: AC
  Administered 2016-05-30: 700 mg via INTRAVENOUS
  Filled 2016-05-30: qty 14

## 2016-05-30 MED ORDER — FLUOROURACIL CHEMO INJECTION 5 GM/100ML
2400.0000 mg/m2 | INTRAVENOUS | Status: DC
  Administered 2016-05-30: 4300 mg via INTRAVENOUS
  Filled 2016-05-30: qty 86

## 2016-05-30 MED ORDER — ONDANSETRON HCL 8 MG PO TABS
8.0000 mg | ORAL_TABLET | Freq: Three times a day (TID) | ORAL | 0 refills | Status: DC | PRN
Start: 2016-05-30 — End: 2016-10-24

## 2016-05-30 NOTE — Addendum Note (Signed)
Addended by: San Morelle on: 05/30/2016 11:13 AM   Modules accepted: Orders

## 2016-05-30 NOTE — Patient Instructions (Signed)
[facility] Discharge Instructions for Patients Receiving Chemotherapy  Today you received the following chemotherapy agents Oxaliplatin, Leucovorin and 5FU.  To help prevent nausea and vomiting after your treatment, we encourage you to take your nausea medication.   If you develop nausea and vomiting that is not controlled by your nausea medication, call the clinic.   BELOW ARE SYMPTOMS THAT SHOULD BE REPORTED IMMEDIATELY:  *FEVER GREATER THAN 100.5 F  *CHILLS WITH OR WITHOUT FEVER  NAUSEA AND VOMITING THAT IS NOT CONTROLLED WITH YOUR NAUSEA MEDICATION  *UNUSUAL SHORTNESS OF BREATH  *UNUSUAL BRUISING OR BLEEDING  TENDERNESS IN MOUTH AND THROAT WITH OR WITHOUT PRESENCE OF ULCERS  *URINARY PROBLEMS  *BOWEL PROBLEMS  UNUSUAL RASH Items with * indicate a potential emergency and should be followed up as soon as possible.  Feel free to call the clinic you have any questions or concerns. The clinic phone number is [phone].  Please show the CHEMO ALERT CARD at check-in to the Emergency Department and triage nurse.   

## 2016-05-30 NOTE — Telephone Encounter (Signed)
Gave patient avs report and appointment for September.  °

## 2016-05-30 NOTE — Progress Notes (Signed)
  [place] OFFICE PROGRESS NOTE   Diagnosis: Gastric cancer  INTERVAL HISTORY:   Mr. Rudow returns as scheduled. He completed another cycle of FOLFOX on 05/14/2016. He reports mild nausea on some mornings. No dysphagia. Good appetite. He has noted an improved energy level since starting iron. No pain. He is no longer taking pain medication.  Objective:  Vital signs in last 24 hours:  Blood pressure (!) 152/82, pulse 91, temperature 98.9 F (37.2 C), temperature source Oral, resp. rate 18, height 5\' 6"  (1.676 m), weight 157 lb 3.2 oz (71.3 kg), SpO2 99 %.    HEENT: No thrush or ulcers Lymphatics: No cervical or supraclavicular nodes Resp: End inspiratory bronchial sounds at the posterior chest bilaterally, no respiratory distress Cardio: Regular rate and rhythm GI: No hepatosplenomegaly, nontender Vascular: No leg edema   Portacath/PICC-without erythema  Lab Results:  Lab Results  Component Value Date   WBC 4.7 05/30/2016   HGB 10.8 (L) 05/30/2016   HCT 34.6 (L) 05/30/2016   MCV 79.6 05/30/2016   PLT 202 05/30/2016   NEUTROABS 2.9 05/30/2016     Medications: I have reviewed the patient's current medications.  Assessment/Plan: 1. Gastric cancer, gastric cardia mass standing to the GE junction confirmed on endoscopy 04/11/2016, biopsy confirmed adenocarcinoma ? Staging CTs of the chest, abdomen, and pelvis 04/09/2016-gastric cardia mass, mediastinal, right hilar, and right supraclavicular lymphadenopathy ? 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 Cycle 1 FOLFOX 04/30/2016 (oxaliplatin given 04/30/2016, 5-FU infusion started 05/02/2016) ? Cycle 2 FOLFOX 05/14/2016 ? Cycle 3 FOLFOX 05/30/2016 2. Anemia secondary to #1-improved  3. Diabetes  4. Gout  5. Hypertension  6. Hyperlipidemia  7. History of a T7 compression fracture  8. Pain secondary to the gastric mass and  mediastinal lymphadenopathy-resolved  9. Port-A-Cath placement 04/20/2016  10. Delayed nausea following cycle 1 FOLFOX-emend added with cycle 2    Disposition:  Mr. Adley has completed 2 cycles of FOLFOX. The pain and his performance status have improved. The plan is to proceed with cycle 3 today. He will return for an office visit and chemotherapy in 2 weeks. He will continue iron.  Betsy Coder, MD  05/30/2016  10:42 AM

## 2016-05-30 NOTE — Patient Instructions (Signed)

## 2016-06-01 ENCOUNTER — Ambulatory Visit (HOSPITAL_BASED_OUTPATIENT_CLINIC_OR_DEPARTMENT_OTHER): Payer: BLUE CROSS/BLUE SHIELD

## 2016-06-01 VITALS — BP 120/80 | HR 82 | Temp 98.8°F | Resp 16

## 2016-06-01 DIAGNOSIS — C160 Malignant neoplasm of cardia: Secondary | ICD-10-CM | POA: Diagnosis not present

## 2016-06-01 DIAGNOSIS — Z452 Encounter for adjustment and management of vascular access device: Secondary | ICD-10-CM

## 2016-06-01 MED ORDER — HEPARIN SOD (PORK) LOCK FLUSH 100 UNIT/ML IV SOLN
500.0000 [IU] | Freq: Once | INTRAVENOUS | Status: AC | PRN
  Administered 2016-06-01: 500 [IU]
  Filled 2016-06-01: qty 5

## 2016-06-01 MED ORDER — SODIUM CHLORIDE 0.9% FLUSH
10.0000 mL | INTRAVENOUS | Status: DC | PRN
  Administered 2016-06-01: 10 mL
  Filled 2016-06-01: qty 10

## 2016-06-10 ENCOUNTER — Other Ambulatory Visit: Payer: Self-pay | Admitting: Oncology

## 2016-06-13 ENCOUNTER — Encounter: Payer: Self-pay | Admitting: *Deleted

## 2016-06-13 ENCOUNTER — Ambulatory Visit (HOSPITAL_BASED_OUTPATIENT_CLINIC_OR_DEPARTMENT_OTHER): Payer: BLUE CROSS/BLUE SHIELD | Admitting: Nurse Practitioner

## 2016-06-13 ENCOUNTER — Ambulatory Visit (HOSPITAL_BASED_OUTPATIENT_CLINIC_OR_DEPARTMENT_OTHER): Payer: BLUE CROSS/BLUE SHIELD

## 2016-06-13 ENCOUNTER — Other Ambulatory Visit (HOSPITAL_BASED_OUTPATIENT_CLINIC_OR_DEPARTMENT_OTHER): Payer: BLUE CROSS/BLUE SHIELD

## 2016-06-13 ENCOUNTER — Ambulatory Visit: Payer: BLUE CROSS/BLUE SHIELD

## 2016-06-13 VITALS — BP 130/81 | HR 89 | Temp 98.2°F | Resp 18 | Ht 66.0 in | Wt 155.3 lb

## 2016-06-13 DIAGNOSIS — Z5111 Encounter for antineoplastic chemotherapy: Secondary | ICD-10-CM

## 2016-06-13 DIAGNOSIS — I10 Essential (primary) hypertension: Secondary | ICD-10-CM

## 2016-06-13 DIAGNOSIS — E785 Hyperlipidemia, unspecified: Secondary | ICD-10-CM | POA: Diagnosis not present

## 2016-06-13 DIAGNOSIS — C160 Malignant neoplasm of cardia: Secondary | ICD-10-CM | POA: Diagnosis not present

## 2016-06-13 DIAGNOSIS — M109 Gout, unspecified: Secondary | ICD-10-CM

## 2016-06-13 DIAGNOSIS — E119 Type 2 diabetes mellitus without complications: Secondary | ICD-10-CM | POA: Diagnosis not present

## 2016-06-13 LAB — COMPREHENSIVE METABOLIC PANEL
ALK PHOS: 100 U/L (ref 40–150)
ALT: 19 U/L (ref 0–55)
ANION GAP: 11 meq/L (ref 3–11)
AST: 19 U/L (ref 5–34)
Albumin: 3.6 g/dL (ref 3.5–5.0)
BUN: 7.7 mg/dL (ref 7.0–26.0)
CALCIUM: 9.2 mg/dL (ref 8.4–10.4)
CO2: 25 meq/L (ref 22–29)
CREATININE: 0.8 mg/dL (ref 0.7–1.3)
Chloride: 108 mEq/L (ref 98–109)
EGFR: >90 mL/min/{1.73_m2} (ref >90)
Glucose: 216 mg/dl — ABNORMAL HIGH (ref 70–140)
Potassium: 3.4 mEq/L — ABNORMAL LOW (ref 3.5–5.1)
Sodium: 143 mEq/L (ref 136–145)
TOTAL PROTEIN: 6.4 g/dL (ref 6.4–8.3)
Total Bilirubin: 0.48 mg/dL (ref 0.20–1.20)

## 2016-06-13 LAB — CBC WITH DIFFERENTIAL/PLATELET
BASO%: 0.3 % (ref 0.0–2.0)
Basophils Absolute: 0 10*3/uL (ref 0.0–0.1)
EOS%: 3.5 % (ref 0.0–7.0)
Eosinophils Absolute: 0.2 10*3/uL (ref 0.0–0.5)
HEMATOCRIT: 35.8 % — AB (ref 38.4–49.9)
HGB: 11.8 g/dL — ABNORMAL LOW (ref 13.0–17.1)
LYMPH#: 1.2 10*3/uL (ref 0.9–3.3)
LYMPH%: 21.1 % (ref 14.0–49.0)
MCH: 26.2 pg — ABNORMAL LOW (ref 27.2–33.4)
MCHC: 33 g/dL (ref 32.0–36.0)
MCV: 79.4 fL (ref 79.3–98.0)
MONO#: 0.6 10*3/uL (ref 0.1–0.9)
MONO%: 10.7 % (ref 0.0–14.0)
NEUT%: 64.4 % (ref 39.0–75.0)
NEUTROS ABS: 3.7 10*3/uL (ref 1.5–6.5)
PLATELETS: 164 10*3/uL (ref 140–400)
RBC: 4.51 10*6/uL (ref 4.20–5.82)
RDW: 18.3 % — AB (ref 11.0–14.6)
WBC: 5.8 10*3/uL (ref 4.0–10.3)

## 2016-06-13 MED ORDER — SODIUM CHLORIDE 0.9% FLUSH
10.0000 mL | INTRAVENOUS | Status: DC | PRN
  Administered 2016-06-13: 10 mL via INTRAVENOUS
  Filled 2016-06-13: qty 10

## 2016-06-13 MED ORDER — OXALIPLATIN CHEMO INJECTION 100 MG/20ML
84.0000 mg/m2 | Freq: Once | INTRAVENOUS | Status: AC
  Administered 2016-06-13: 150 mg via INTRAVENOUS
  Filled 2016-06-13: qty 20

## 2016-06-13 MED ORDER — PALONOSETRON HCL INJECTION 0.25 MG/5ML
0.2500 mg | Freq: Once | INTRAVENOUS | Status: AC
  Administered 2016-06-13: 0.25 mg via INTRAVENOUS

## 2016-06-13 MED ORDER — SODIUM CHLORIDE 0.9% FLUSH
10.0000 mL | INTRAVENOUS | Status: DC | PRN
  Filled 2016-06-13: qty 10

## 2016-06-13 MED ORDER — HEPARIN SOD (PORK) LOCK FLUSH 100 UNIT/ML IV SOLN
500.0000 [IU] | Freq: Once | INTRAVENOUS | Status: DC | PRN
  Filled 2016-06-13: qty 5

## 2016-06-13 MED ORDER — FLUOROURACIL CHEMO INJECTION 2.5 GM/50ML
400.0000 mg/m2 | Freq: Once | INTRAVENOUS | Status: AC
  Administered 2016-06-13: 700 mg via INTRAVENOUS
  Filled 2016-06-13: qty 14

## 2016-06-13 MED ORDER — SODIUM CHLORIDE 0.9 % IV SOLN
Freq: Once | INTRAVENOUS | Status: AC
  Administered 2016-06-13: 12:00:00 via INTRAVENOUS
  Filled 2016-06-13: qty 5

## 2016-06-13 MED ORDER — LEUCOVORIN CALCIUM INJECTION 350 MG
400.0000 mg/m2 | Freq: Once | INTRAVENOUS | Status: AC
  Administered 2016-06-13: 720 mg via INTRAVENOUS
  Filled 2016-06-13: qty 36

## 2016-06-13 MED ORDER — DEXTROSE 5 % IV SOLN
Freq: Once | INTRAVENOUS | Status: AC
  Administered 2016-06-13: 12:00:00 via INTRAVENOUS

## 2016-06-13 MED ORDER — SODIUM CHLORIDE 0.9 % IV SOLN
2400.0000 mg/m2 | INTRAVENOUS | Status: DC
  Administered 2016-06-13: 4300 mg via INTRAVENOUS
  Filled 2016-06-13: qty 86

## 2016-06-13 MED ORDER — PALONOSETRON HCL INJECTION 0.25 MG/5ML
INTRAVENOUS | Status: AC
  Filled 2016-06-13: qty 5

## 2016-06-13 NOTE — Patient Instructions (Signed)

## 2016-06-13 NOTE — Progress Notes (Signed)
Oncology Nurse Navigator Documentation  Oncology Nurse Navigator Flowsheets 06/13/2016  Navigator Location [facility]  Navigator Encounter Type Treatment--tolerating chemo well  Telephone -  Abnormal Finding Date -  Confirmed Diagnosis Date -  Treatment Initiated Date -  Patient Visit Type MedOnc  Treatment Phase Active Tx--FOLFOX  Barriers/Navigation Needs Financial--asking about application for Medicaid  Interventions Other--instructed his significant other to see the financial advocate, Stefanie Libel and she can direct them on how to apply for Medicaid  Referrals -  Coordination of Care -  Education Method -  Support Groups/Services -  Acuity Level 1  Time Spent with Patient 15

## 2016-06-13 NOTE — Progress Notes (Signed)
  [place] OFFICE PROGRESS NOTE   Diagnosis:  Gastric cancer  INTERVAL HISTORY:   Robert Beck returns as scheduled. He completed cycle 3 FOLFOX 05/30/2016. He has occasional mild nausea. No significant diarrhea. Pain present prior to chemotherapy has mostly resolved. No dysphagia. No mouth sores. Cold sensitivity lasted about 1 week. No persistent neuropathy symptoms.  Objective:  Vital signs in last 24 hours:  Blood pressure 130/81, pulse 89, temperature 98.2 F (36.8 C), temperature source Oral, resp. rate 18, height 5\' 6"  (1.676 m), weight 155 lb 4.8 oz (70.4 kg), SpO2 100 %.    HEENT: No thrush or ulcers. Lymphatics: No palpable cervical or supraclavicular lymph nodes. Resp: Lungs clear bilaterally. Cardio: Regular rate and rhythm. GI: Abdomen soft and nontender. No hepatomegaly. Vascular: No leg edema. Neuro: Vibratory sense intact over the fingertips per tuning fork exam.  Port-A-Cath without erythema.  Lab Results:  Lab Results  Component Value Date   WBC 5.8 06/13/2016   HGB 11.8 (L) 06/13/2016   HCT 35.8 (L) 06/13/2016   MCV 79.4 06/13/2016   PLT 164 06/13/2016   NEUTROABS 3.7 06/13/2016    Imaging:  No results found.  Medications: I have reviewed the patient's current medications.  Assessment/Plan: 1. Gastric cancer, gastric cardia mass standing to the GE junction confirmed on endoscopy 04/11/2016, biopsy confirmed adenocarcinoma ? Staging CTs of the chest, abdomen, and pelvis 04/09/2016-gastric cardia mass, mediastinal, right hilar, and right supraclavicular lymphadenopathy ? 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 Cycle 1 FOLFOX 04/30/2016 (oxaliplatin given 04/30/2016, 5-FU infusion started 05/02/2016) ? Cycle 2 FOLFOX 05/14/2016 ? Cycle 3 FOLFOX 05/30/2016 ? Cycle 4 FOLFOX 06/13/2016 2. Anemia secondary to #1-improved  3. Diabetes  4. Gout  5. Hypertension  6.  Hyperlipidemia  7. History of a T7 compression fracture  8. Pain secondary to the gastric mass and mediastinal lymphadenopathy-resolved  9. Port-A-Cath placement 04/20/2016  10. Delayed nausea following cycle 1 FOLFOX-emend added with cycle 2   Disposition: Robert Beck appears well. He has completed 3 cycles of FOLFOX. Pain and performance status continue to be improved. Plan to proceed with cycle 4 FOLFOX today as scheduled. He will return for a follow-up visit and cycle 5 in 2 weeks. He will contact the office in the interim with any problems.  Plan reviewed with Dr. Benay Spice.    Robert Beck ANP/GNP-BC   06/13/2016  10:38 AM

## 2016-06-15 ENCOUNTER — Ambulatory Visit (HOSPITAL_BASED_OUTPATIENT_CLINIC_OR_DEPARTMENT_OTHER): Payer: BLUE CROSS/BLUE SHIELD

## 2016-06-15 ENCOUNTER — Telehealth: Payer: Self-pay | Admitting: Oncology

## 2016-06-15 VITALS — BP 141/85 | HR 78 | Temp 98.3°F | Resp 18

## 2016-06-15 DIAGNOSIS — C160 Malignant neoplasm of cardia: Secondary | ICD-10-CM | POA: Diagnosis not present

## 2016-06-15 MED ORDER — SODIUM CHLORIDE 0.9% FLUSH
10.0000 mL | INTRAVENOUS | Status: DC | PRN
  Administered 2016-06-15: 10 mL
  Filled 2016-06-15: qty 10

## 2016-06-15 MED ORDER — HEPARIN SOD (PORK) LOCK FLUSH 100 UNIT/ML IV SOLN
500.0000 [IU] | Freq: Once | INTRAVENOUS | Status: AC | PRN
  Administered 2016-06-15: 500 [IU]
  Filled 2016-06-15: qty 5

## 2016-06-15 NOTE — Patient Instructions (Signed)

## 2016-06-15 NOTE — Telephone Encounter (Signed)
sw pt to confirm 10/4 appt per LOS

## 2016-06-23 ENCOUNTER — Other Ambulatory Visit: Payer: Self-pay | Admitting: Oncology

## 2016-06-27 ENCOUNTER — Ambulatory Visit (HOSPITAL_BASED_OUTPATIENT_CLINIC_OR_DEPARTMENT_OTHER): Payer: BLUE CROSS/BLUE SHIELD

## 2016-06-27 ENCOUNTER — Telehealth: Payer: Self-pay | Admitting: *Deleted

## 2016-06-27 ENCOUNTER — Other Ambulatory Visit (HOSPITAL_BASED_OUTPATIENT_CLINIC_OR_DEPARTMENT_OTHER): Payer: BLUE CROSS/BLUE SHIELD

## 2016-06-27 ENCOUNTER — Telehealth: Payer: Self-pay | Admitting: Oncology

## 2016-06-27 ENCOUNTER — Ambulatory Visit: Payer: BLUE CROSS/BLUE SHIELD

## 2016-06-27 ENCOUNTER — Ambulatory Visit (HOSPITAL_BASED_OUTPATIENT_CLINIC_OR_DEPARTMENT_OTHER): Payer: BLUE CROSS/BLUE SHIELD | Admitting: Nurse Practitioner

## 2016-06-27 VITALS — BP 152/93 | HR 97 | Temp 98.1°F | Resp 17 | Ht 66.0 in | Wt 159.5 lb

## 2016-06-27 DIAGNOSIS — C160 Malignant neoplasm of cardia: Secondary | ICD-10-CM

## 2016-06-27 DIAGNOSIS — E119 Type 2 diabetes mellitus without complications: Secondary | ICD-10-CM

## 2016-06-27 DIAGNOSIS — E785 Hyperlipidemia, unspecified: Secondary | ICD-10-CM

## 2016-06-27 DIAGNOSIS — I10 Essential (primary) hypertension: Secondary | ICD-10-CM | POA: Diagnosis not present

## 2016-06-27 DIAGNOSIS — Z95828 Presence of other vascular implants and grafts: Secondary | ICD-10-CM

## 2016-06-27 DIAGNOSIS — Z5111 Encounter for antineoplastic chemotherapy: Secondary | ICD-10-CM

## 2016-06-27 LAB — CBC WITH DIFFERENTIAL/PLATELET
BASO%: 0.8 % (ref 0.0–2.0)
BASOS ABS: 0 10*3/uL (ref 0.0–0.1)
EOS ABS: 0.1 10*3/uL (ref 0.0–0.5)
EOS%: 2.5 % (ref 0.0–7.0)
HEMATOCRIT: 37.7 % — AB (ref 38.4–49.9)
HEMOGLOBIN: 12.3 g/dL — AB (ref 13.0–17.1)
LYMPH#: 1 10*3/uL (ref 0.9–3.3)
LYMPH%: 20.6 % (ref 14.0–49.0)
MCH: 26.7 pg — AB (ref 27.2–33.4)
MCHC: 32.7 g/dL (ref 32.0–36.0)
MCV: 81.8 fL (ref 79.3–98.0)
MONO#: 0.5 10*3/uL (ref 0.1–0.9)
MONO%: 10.5 % (ref 0.0–14.0)
NEUT#: 3.1 10*3/uL (ref 1.5–6.5)
NEUT%: 65.6 % (ref 39.0–75.0)
PLATELETS: 148 10*3/uL (ref 140–400)
RBC: 4.61 10*6/uL (ref 4.20–5.82)
RDW: 21.9 % — AB (ref 11.0–14.6)
WBC: 4.7 10*3/uL (ref 4.0–10.3)

## 2016-06-27 LAB — COMPREHENSIVE METABOLIC PANEL
ALBUMIN: 3.6 g/dL (ref 3.5–5.0)
ALK PHOS: 106 U/L (ref 40–150)
ALT: 21 U/L (ref 0–55)
ANION GAP: 12 meq/L — AB (ref 3–11)
AST: 20 U/L (ref 5–34)
BUN: 7.3 mg/dL (ref 7.0–26.0)
CALCIUM: 9.2 mg/dL (ref 8.4–10.4)
CHLORIDE: 106 meq/L (ref 98–109)
CO2: 22 mEq/L (ref 22–29)
Creatinine: 0.8 mg/dL (ref 0.7–1.3)
Glucose: 347 mg/dl — ABNORMAL HIGH (ref 70–140)
POTASSIUM: 3.8 meq/L (ref 3.5–5.1)
Sodium: 140 mEq/L (ref 136–145)
Total Bilirubin: 0.48 mg/dL (ref 0.20–1.20)
Total Protein: 6.3 g/dL — ABNORMAL LOW (ref 6.4–8.3)

## 2016-06-27 LAB — CEA (IN HOUSE-CHCC): CEA (CHCC-IN HOUSE): 3.83 ng/mL (ref 0.00–5.00)

## 2016-06-27 MED ORDER — LEUCOVORIN CALCIUM INJECTION 350 MG
400.0000 mg/m2 | Freq: Once | INTRAVENOUS | Status: AC
  Administered 2016-06-27: 720 mg via INTRAVENOUS
  Filled 2016-06-27: qty 36

## 2016-06-27 MED ORDER — SODIUM CHLORIDE 0.9 % IV SOLN
2400.0000 mg/m2 | INTRAVENOUS | Status: DC
  Administered 2016-06-27: 4300 mg via INTRAVENOUS
  Filled 2016-06-27: qty 86

## 2016-06-27 MED ORDER — SODIUM CHLORIDE 0.9% FLUSH
10.0000 mL | INTRAVENOUS | Status: DC | PRN
  Administered 2016-06-27: 10 mL via INTRAVENOUS
  Filled 2016-06-27: qty 10

## 2016-06-27 MED ORDER — FLUOROURACIL CHEMO INJECTION 2.5 GM/50ML
400.0000 mg/m2 | Freq: Once | INTRAVENOUS | Status: AC
  Administered 2016-06-27: 700 mg via INTRAVENOUS
  Filled 2016-06-27: qty 14

## 2016-06-27 MED ORDER — DEXTROSE 5 % IV SOLN
Freq: Once | INTRAVENOUS | Status: AC
  Administered 2016-06-27: 13:00:00 via INTRAVENOUS

## 2016-06-27 MED ORDER — SODIUM CHLORIDE 0.9 % IV SOLN
Freq: Once | INTRAVENOUS | Status: AC
  Administered 2016-06-27: 13:00:00 via INTRAVENOUS
  Filled 2016-06-27: qty 5

## 2016-06-27 MED ORDER — PALONOSETRON HCL INJECTION 0.25 MG/5ML
0.2500 mg | Freq: Once | INTRAVENOUS | Status: AC
  Administered 2016-06-27: 0.25 mg via INTRAVENOUS

## 2016-06-27 MED ORDER — OXALIPLATIN CHEMO INJECTION 100 MG/20ML
84.0000 mg/m2 | Freq: Once | INTRAVENOUS | Status: AC
  Administered 2016-06-27: 150 mg via INTRAVENOUS
  Filled 2016-06-27: qty 20

## 2016-06-27 MED ORDER — SODIUM CHLORIDE 0.9% FLUSH
10.0000 mL | INTRAVENOUS | Status: DC | PRN
  Filled 2016-06-27: qty 10

## 2016-06-27 NOTE — Progress Notes (Signed)
  [place] OFFICE PROGRESS NOTE   Diagnosis:  Gastric cancer  INTERVAL HISTORY:   Robert Beck returns as scheduled. He completed cycle 4 FOLFOX 06/13/2016. He had mild nausea. No vomiting. No mouth sores. No significant diarrhea. Cold sensitivity lasted about 7 days. No persistent neuropathy symptoms. The pain he was experiencing prior to chemotherapy has completely resolved. He is no longer taking pain medication.  Objective:  Vital signs in last 24 hours:  Blood pressure (!) 152/93, pulse 97, temperature 98.1 F (36.7 C), resp. rate 17, height 5\' 6"  (1.676 m), weight 159 lb 8 oz (72.3 kg), SpO2 100 %.    HEENT: No thrush or ulcers. Lymphatics: No palpable cervical or supraclavicular lymph nodes. Resp: Lungs clear bilaterally. Cardio: Regular rate and rhythm. GI: Abdomen soft and nontender. No hepatomegaly. Vascular: No leg edema. Neuro: Vibratory sense intact to minimally decreased over the fingertips per tuning fork exam.  Skin: No rash. Port-A-Cath without erythema.    Lab Results:  Lab Results  Component Value Date   WBC 4.7 06/27/2016   HGB 12.3 (L) 06/27/2016   HCT 37.7 (L) 06/27/2016   MCV 81.8 06/27/2016   PLT 148 06/27/2016   NEUTROABS 3.1 06/27/2016    Imaging:  No results found.  Medications: I have reviewed the patient's current medications.  Assessment/Plan: 1. Gastric cancer, gastric cardia mass standing to the GE junction confirmed on endoscopy 04/11/2016, biopsy confirmed adenocarcinoma ? Staging CTs of the chest, abdomen, and pelvis 04/09/2016-gastric cardia mass, mediastinal, right hilar, and right supraclavicular lymphadenopathy ? 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 Cycle 1 FOLFOX 04/30/2016 (oxaliplatin given 04/30/2016, 5-FU infusion started 05/02/2016) ? Cycle 2 FOLFOX 05/14/2016 ? Cycle 3 FOLFOX 05/30/2016 ? Cycle 4 FOLFOX 06/13/2016 ? Cycle 5 FOLFOX  06/27/2016 2. Anemia secondary to #1-improved  3. Diabetes  4. Gout  5. Hypertension  6. Hyperlipidemia  7. History of a T7 compression fracture  8. Pain secondary to the gastric mass and mediastinal lymphadenopathy-resolved  9. Port-A-Cath placement 04/20/2016  10. Delayed nausea following cycle 1 FOLFOX-emend added with cycle 2   Disposition: Robert Beck appears well. He has completed 4 cycles of FOLFOX. Plan to proceed with cycle 5 today as scheduled. He will return for a follow-up visit and cycle 6 in 2 weeks. He will contact the office in the interim with any problems.    Ned Card ANP/GNP-BC   06/27/2016  11:28 AM

## 2016-06-27 NOTE — Telephone Encounter (Signed)
Message sent to chemo scheduler to be added. Avs report and appointment schedule given to patient, per 06/27/16 los. °

## 2016-06-27 NOTE — Patient Instructions (Signed)
[facility] Discharge Instructions for Patients Receiving Chemotherapy  Today you received the following chemotherapy agents Oxaliplatin, Leucovorin and 5FU.  To help prevent nausea and vomiting after your treatment, we encourage you to take your nausea medication.   If you develop nausea and vomiting that is not controlled by your nausea medication, call the clinic.   BELOW ARE SYMPTOMS THAT SHOULD BE REPORTED IMMEDIATELY:  *FEVER GREATER THAN 100.5 F  *CHILLS WITH OR WITHOUT FEVER  NAUSEA AND VOMITING THAT IS NOT CONTROLLED WITH YOUR NAUSEA MEDICATION  *UNUSUAL SHORTNESS OF BREATH  *UNUSUAL BRUISING OR BLEEDING  TENDERNESS IN MOUTH AND THROAT WITH OR WITHOUT PRESENCE OF ULCERS  *URINARY PROBLEMS  *BOWEL PROBLEMS  UNUSUAL RASH Items with * indicate a potential emergency and should be followed up as soon as possible.  Feel free to call the clinic you have any questions or concerns. The clinic phone number is [phone].  Please show the CHEMO ALERT CARD at check-in to the Emergency Department and triage nurse.   

## 2016-06-27 NOTE — Telephone Encounter (Signed)
Per LOS I have scheduled appts and notified the scheduler 

## 2016-06-28 ENCOUNTER — Other Ambulatory Visit: Payer: Self-pay | Admitting: Nurse Practitioner

## 2016-06-28 DIAGNOSIS — C160 Malignant neoplasm of cardia: Secondary | ICD-10-CM

## 2016-06-29 ENCOUNTER — Ambulatory Visit (HOSPITAL_BASED_OUTPATIENT_CLINIC_OR_DEPARTMENT_OTHER): Payer: BLUE CROSS/BLUE SHIELD

## 2016-06-29 VITALS — BP 120/81 | HR 82 | Temp 98.1°F | Resp 16

## 2016-06-29 DIAGNOSIS — C160 Malignant neoplasm of cardia: Secondary | ICD-10-CM

## 2016-06-29 MED ORDER — SODIUM CHLORIDE 0.9% FLUSH
10.0000 mL | INTRAVENOUS | Status: DC | PRN
  Administered 2016-06-29: 10 mL
  Filled 2016-06-29: qty 10

## 2016-06-29 MED ORDER — HEPARIN SOD (PORK) LOCK FLUSH 100 UNIT/ML IV SOLN
500.0000 [IU] | Freq: Once | INTRAVENOUS | Status: AC | PRN
  Administered 2016-06-29: 500 [IU]
  Filled 2016-06-29: qty 5

## 2016-07-08 ENCOUNTER — Other Ambulatory Visit: Payer: Self-pay | Admitting: Oncology

## 2016-07-11 ENCOUNTER — Ambulatory Visit (HOSPITAL_BASED_OUTPATIENT_CLINIC_OR_DEPARTMENT_OTHER): Payer: BLUE CROSS/BLUE SHIELD | Admitting: Oncology

## 2016-07-11 ENCOUNTER — Other Ambulatory Visit (HOSPITAL_BASED_OUTPATIENT_CLINIC_OR_DEPARTMENT_OTHER): Payer: BLUE CROSS/BLUE SHIELD

## 2016-07-11 ENCOUNTER — Ambulatory Visit (HOSPITAL_BASED_OUTPATIENT_CLINIC_OR_DEPARTMENT_OTHER): Payer: BLUE CROSS/BLUE SHIELD

## 2016-07-11 ENCOUNTER — Ambulatory Visit: Payer: BLUE CROSS/BLUE SHIELD | Admitting: Nurse Practitioner

## 2016-07-11 VITALS — BP 126/91 | HR 87 | Temp 97.8°F | Resp 18 | Ht 66.0 in | Wt 161.2 lb

## 2016-07-11 DIAGNOSIS — C160 Malignant neoplasm of cardia: Secondary | ICD-10-CM

## 2016-07-11 DIAGNOSIS — C770 Secondary and unspecified malignant neoplasm of lymph nodes of head, face and neck: Secondary | ICD-10-CM

## 2016-07-11 DIAGNOSIS — E785 Hyperlipidemia, unspecified: Secondary | ICD-10-CM

## 2016-07-11 DIAGNOSIS — I10 Essential (primary) hypertension: Secondary | ICD-10-CM

## 2016-07-11 DIAGNOSIS — E119 Type 2 diabetes mellitus without complications: Secondary | ICD-10-CM | POA: Diagnosis not present

## 2016-07-11 DIAGNOSIS — Z5111 Encounter for antineoplastic chemotherapy: Secondary | ICD-10-CM

## 2016-07-11 DIAGNOSIS — M109 Gout, unspecified: Secondary | ICD-10-CM

## 2016-07-11 DIAGNOSIS — Z23 Encounter for immunization: Secondary | ICD-10-CM

## 2016-07-11 LAB — COMPREHENSIVE METABOLIC PANEL
ALT: 25 U/L (ref 0–55)
AST: 28 U/L (ref 5–34)
Albumin: 3.5 g/dL (ref 3.5–5.0)
Alkaline Phosphatase: 88 U/L (ref 40–150)
Anion Gap: 10 mEq/L (ref 3–11)
BILIRUBIN TOTAL: 0.31 mg/dL (ref 0.20–1.20)
BUN: 6.4 mg/dL — ABNORMAL LOW (ref 7.0–26.0)
CHLORIDE: 111 meq/L — AB (ref 98–109)
CO2: 23 meq/L (ref 22–29)
CREATININE: 0.7 mg/dL (ref 0.7–1.3)
Calcium: 9.3 mg/dL (ref 8.4–10.4)
GLUCOSE: 139 mg/dL (ref 70–140)
Potassium: 3.5 mEq/L (ref 3.5–5.1)
SODIUM: 144 meq/L (ref 136–145)
TOTAL PROTEIN: 6.4 g/dL (ref 6.4–8.3)

## 2016-07-11 LAB — CBC WITH DIFFERENTIAL/PLATELET
BASO%: 0.4 % (ref 0.0–2.0)
Basophils Absolute: 0 10*3/uL (ref 0.0–0.1)
EOS%: 3.5 % (ref 0.0–7.0)
Eosinophils Absolute: 0.2 10*3/uL (ref 0.0–0.5)
HCT: 36.4 % — ABNORMAL LOW (ref 38.4–49.9)
HGB: 12.5 g/dL — ABNORMAL LOW (ref 13.0–17.1)
LYMPH%: 25.7 % (ref 14.0–49.0)
MCH: 27.8 pg (ref 27.2–33.4)
MCHC: 34.3 g/dL (ref 32.0–36.0)
MCV: 80.9 fL (ref 79.3–98.0)
MONO#: 0.5 10*3/uL (ref 0.1–0.9)
MONO%: 9.4 % (ref 0.0–14.0)
NEUT%: 61 % (ref 39.0–75.0)
NEUTROS ABS: 3.3 10*3/uL (ref 1.5–6.5)
Platelets: 113 10*3/uL — ABNORMAL LOW (ref 140–400)
RBC: 4.5 10*6/uL (ref 4.20–5.82)
RDW: 17.4 % — ABNORMAL HIGH (ref 11.0–14.6)
WBC: 5.4 10*3/uL (ref 4.0–10.3)
lymph#: 1.4 10*3/uL (ref 0.9–3.3)

## 2016-07-11 MED ORDER — HEPARIN SOD (PORK) LOCK FLUSH 100 UNIT/ML IV SOLN
500.0000 [IU] | Freq: Once | INTRAVENOUS | Status: DC | PRN
  Filled 2016-07-11: qty 5

## 2016-07-11 MED ORDER — FOSAPREPITANT DIMEGLUMINE INJECTION 150 MG
Freq: Once | INTRAVENOUS | Status: AC
  Administered 2016-07-11: 12:00:00 via INTRAVENOUS
  Filled 2016-07-11: qty 5

## 2016-07-11 MED ORDER — DEXTROSE 5 % IV SOLN
Freq: Once | INTRAVENOUS | Status: AC
  Administered 2016-07-11: 12:00:00 via INTRAVENOUS

## 2016-07-11 MED ORDER — LEUCOVORIN CALCIUM INJECTION 350 MG
400.0000 mg/m2 | Freq: Once | INTRAVENOUS | Status: AC
  Administered 2016-07-11: 720 mg via INTRAVENOUS
  Filled 2016-07-11: qty 36

## 2016-07-11 MED ORDER — SODIUM CHLORIDE 0.9% FLUSH
10.0000 mL | INTRAVENOUS | Status: DC | PRN
  Filled 2016-07-11: qty 10

## 2016-07-11 MED ORDER — PALONOSETRON HCL INJECTION 0.25 MG/5ML
0.2500 mg | Freq: Once | INTRAVENOUS | Status: AC
  Administered 2016-07-11: 0.25 mg via INTRAVENOUS

## 2016-07-11 MED ORDER — SODIUM CHLORIDE 0.9 % IV SOLN
2400.0000 mg/m2 | INTRAVENOUS | Status: DC
  Administered 2016-07-11: 4300 mg via INTRAVENOUS
  Filled 2016-07-11: qty 86

## 2016-07-11 MED ORDER — SODIUM CHLORIDE 0.9% FLUSH
10.0000 mL | Freq: Once | INTRAVENOUS | Status: AC
  Administered 2016-07-11: 10 mL via INTRAVENOUS
  Filled 2016-07-11: qty 10

## 2016-07-11 MED ORDER — PALONOSETRON HCL INJECTION 0.25 MG/5ML
INTRAVENOUS | Status: AC
  Filled 2016-07-11: qty 5

## 2016-07-11 MED ORDER — OXALIPLATIN CHEMO INJECTION 100 MG/20ML
84.0000 mg/m2 | Freq: Once | INTRAVENOUS | Status: AC
  Administered 2016-07-11: 150 mg via INTRAVENOUS
  Filled 2016-07-11: qty 10

## 2016-07-11 MED ORDER — FLUOROURACIL CHEMO INJECTION 2.5 GM/50ML
400.0000 mg/m2 | Freq: Once | INTRAVENOUS | Status: AC
  Administered 2016-07-11: 700 mg via INTRAVENOUS
  Filled 2016-07-11: qty 14

## 2016-07-11 MED ORDER — INFLUENZA VAC SPLIT QUAD 0.5 ML IM SUSY
0.5000 mL | PREFILLED_SYRINGE | Freq: Once | INTRAMUSCULAR | Status: AC
  Administered 2016-07-11: 0.5 mL via INTRAMUSCULAR
  Filled 2016-07-11: qty 0.5

## 2016-07-11 NOTE — Progress Notes (Signed)
   [place] OFFICE PROGRESS NOTE   Diagnosis: gastroesophageal cancer  INTERVAL HISTORY:   Robert Beck returns as scheduled. No dysphagia. No pain.he completed another cycle of FOLFOX on 06/27/2016. He had more prolonged cold sensitivity after this cycle, lasting one week. No neuropathy symptoms at present.  Objective:  Vital signs in last 24 hours:  Blood pressure (!) 126/91, pulse 87, temperature 97.8 F (36.6 C), temperature source Oral, resp. rate 18, height 5\' 6"  (1.676 m), weight 161 lb 3.2 oz (73.1 kg), SpO2 100 %.    HEENT: no thrush or ulcers Resp: lungs clear bilaterally Cardio: regular rate and rhythm GI: no hepatosplenomegaly, nontender Vascular: no leg edema Neuro:mild loss of vibratory sense at the fingers bilaterally    Portacath/PICC-without erythema  Lab Results:  Lab Results  Component Value Date   WBC 5.4 07/11/2016   HGB 12.5 (L) 07/11/2016   HCT 36.4 (L) 07/11/2016   MCV 80.9 07/11/2016   PLT 113 (L) 07/11/2016   NEUTROABS 3.3 07/11/2016     Medications: I have reviewed the patient's current medications.  Assessment/Plan: 1. Gastric cancer, gastric cardia mass standing to the GE junction confirmed on endoscopy 04/11/2016, biopsy confirmed adenocarcinoma ? Staging CTs of the chest, abdomen, and pelvis 04/09/2016-gastric cardia mass, mediastinal, right hilar, and right supraclavicular lymphadenopathy ? 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 Cycle 1 FOLFOX 04/30/2016 (oxaliplatin given 04/30/2016, 5-FU infusion started 05/02/2016) ? Cycle 2 FOLFOX 05/14/2016 ? Cycle 3 FOLFOX 05/30/2016 ? Cycle 4 FOLFOX 06/13/2016 ? Cycle 5 FOLFOX 06/27/2016 ? Cycle 6 FOLFOX 07/11/2016 2. Anemia secondary to #1-improved  3. Diabetes  4. Gout  5. Hypertension  6. Hyperlipidemia  7. History of a T7 compression fracture  8. Pain secondary to the gastric mass and  mediastinal lymphadenopathy-resolved  9. Port-A-Cath placement 04/20/2016  10. Delayed nausea following cycle 1 FOLFOX-emend added with cycle 2     Disposition:  Robert Beck continues to tolerate the chemotherapy well. He will complete another cycle of FOLFOX today. He will undergo restaging CTs after this cycle. He will return for an office visit and chemotherapy in 2 weeks.he received an influenza vaccine today.  Betsy Coder, MD  07/11/2016  2:42 PM

## 2016-07-11 NOTE — Patient Instructions (Signed)

## 2016-07-11 NOTE — Patient Instructions (Signed)
[place] Discharge Instructions for Patients Receiving Chemotherapy  Today you received the following chemotherapy agents Leucovorin, Oxaliplatin and Adrucil  To help prevent nausea and vomiting after your treatment, we encourage you to take your nausea medication as directed.    If you develop nausea and vomiting that is not controlled by your nausea medication, call the clinic.   BELOW ARE SYMPTOMS THAT SHOULD BE REPORTED IMMEDIATELY:  *FEVER GREATER THAN 100.5 F  *CHILLS WITH OR WITHOUT FEVER  NAUSEA AND VOMITING THAT IS NOT CONTROLLED WITH YOUR NAUSEA MEDICATION  *UNUSUAL SHORTNESS OF BREATH  *UNUSUAL BRUISING OR BLEEDING  TENDERNESS IN MOUTH AND THROAT WITH OR WITHOUT PRESENCE OF ULCERS  *URINARY PROBLEMS  *BOWEL PROBLEMS  UNUSUAL RASH Items with * indicate a potential emergency and should be followed up as soon as possible.  Feel free to call the clinic you have any questions or concerns. The clinic phone number is [phone].  Please show the [place] at check-in to the Emergency Department and triage nurse.

## 2016-07-13 ENCOUNTER — Ambulatory Visit (HOSPITAL_BASED_OUTPATIENT_CLINIC_OR_DEPARTMENT_OTHER): Payer: BLUE CROSS/BLUE SHIELD

## 2016-07-13 VITALS — BP 115/91 | HR 90 | Temp 97.7°F | Resp 18

## 2016-07-13 DIAGNOSIS — C160 Malignant neoplasm of cardia: Secondary | ICD-10-CM

## 2016-07-13 DIAGNOSIS — C770 Secondary and unspecified malignant neoplasm of lymph nodes of head, face and neck: Secondary | ICD-10-CM

## 2016-07-13 MED ORDER — HEPARIN SOD (PORK) LOCK FLUSH 100 UNIT/ML IV SOLN
500.0000 [IU] | Freq: Once | INTRAVENOUS | Status: AC | PRN
  Administered 2016-07-13: 500 [IU]
  Filled 2016-07-13: qty 5

## 2016-07-13 MED ORDER — SODIUM CHLORIDE 0.9% FLUSH
10.0000 mL | INTRAVENOUS | Status: DC | PRN
  Administered 2016-07-13: 10 mL
  Filled 2016-07-13: qty 10

## 2016-07-13 NOTE — Patient Instructions (Signed)

## 2016-07-20 ENCOUNTER — Encounter (HOSPITAL_COMMUNITY): Payer: Self-pay

## 2016-07-20 ENCOUNTER — Ambulatory Visit (HOSPITAL_COMMUNITY)
Admission: RE | Admit: 2016-07-20 | Discharge: 2016-07-20 | Disposition: A | Discharge status: Home / Self Care | Payer: BLUE CROSS/BLUE SHIELD | Source: Ambulatory Visit | Attending: Nurse Practitioner | Admitting: Nurse Practitioner

## 2016-07-20 DIAGNOSIS — I2510 Atherosclerotic heart disease of native coronary artery without angina pectoris: Secondary | ICD-10-CM | POA: Insufficient documentation

## 2016-07-20 DIAGNOSIS — C160 Malignant neoplasm of cardia: Secondary | ICD-10-CM | POA: Insufficient documentation

## 2016-07-20 DIAGNOSIS — K5730 Diverticulosis of large intestine without perforation or abscess without bleeding: Secondary | ICD-10-CM | POA: Insufficient documentation

## 2016-07-20 DIAGNOSIS — I700 Atherosclerosis of aorta: Secondary | ICD-10-CM | POA: Insufficient documentation

## 2016-07-20 DIAGNOSIS — R590 Localized enlarged lymph nodes: Secondary | ICD-10-CM | POA: Diagnosis not present

## 2016-07-20 MED ORDER — IOPAMIDOL (ISOVUE-300) INJECTION 61%
100.0000 mL | Freq: Once | INTRAVENOUS | Status: AC | PRN
  Administered 2016-07-20: 100 mL via INTRAVENOUS

## 2016-07-22 ENCOUNTER — Other Ambulatory Visit: Payer: Self-pay | Admitting: Oncology

## 2016-07-22 ENCOUNTER — Encounter: Payer: Self-pay | Admitting: Oncology

## 2016-07-25 ENCOUNTER — Ambulatory Visit (HOSPITAL_BASED_OUTPATIENT_CLINIC_OR_DEPARTMENT_OTHER): Payer: BLUE CROSS/BLUE SHIELD | Admitting: Oncology

## 2016-07-25 ENCOUNTER — Ambulatory Visit (HOSPITAL_BASED_OUTPATIENT_CLINIC_OR_DEPARTMENT_OTHER): Payer: BLUE CROSS/BLUE SHIELD

## 2016-07-25 ENCOUNTER — Ambulatory Visit: Payer: BLUE CROSS/BLUE SHIELD

## 2016-07-25 ENCOUNTER — Other Ambulatory Visit (HOSPITAL_BASED_OUTPATIENT_CLINIC_OR_DEPARTMENT_OTHER): Payer: BLUE CROSS/BLUE SHIELD

## 2016-07-25 VITALS — BP 142/88 | HR 90 | Temp 97.4°F | Resp 17 | Ht 66.0 in | Wt 162.3 lb

## 2016-07-25 DIAGNOSIS — C160 Malignant neoplasm of cardia: Secondary | ICD-10-CM

## 2016-07-25 DIAGNOSIS — I10 Essential (primary) hypertension: Secondary | ICD-10-CM

## 2016-07-25 DIAGNOSIS — G620 Drug-induced polyneuropathy: Secondary | ICD-10-CM | POA: Diagnosis not present

## 2016-07-25 DIAGNOSIS — Z5111 Encounter for antineoplastic chemotherapy: Secondary | ICD-10-CM

## 2016-07-25 DIAGNOSIS — C770 Secondary and unspecified malignant neoplasm of lymph nodes of head, face and neck: Secondary | ICD-10-CM

## 2016-07-25 DIAGNOSIS — E119 Type 2 diabetes mellitus without complications: Secondary | ICD-10-CM

## 2016-07-25 DIAGNOSIS — E785 Hyperlipidemia, unspecified: Secondary | ICD-10-CM

## 2016-07-25 DIAGNOSIS — M109 Gout, unspecified: Secondary | ICD-10-CM

## 2016-07-25 LAB — CBC WITH DIFFERENTIAL/PLATELET
BASO%: 1.7 % (ref 0.0–2.0)
BASOS ABS: 0.1 10*3/uL (ref 0.0–0.1)
EOS ABS: 0.4 10*3/uL (ref 0.0–0.5)
EOS%: 7 % (ref 0.0–7.0)
HCT: 38.1 % — ABNORMAL LOW (ref 38.4–49.9)
HGB: 13 g/dL (ref 13.0–17.1)
LYMPH%: 21.9 % (ref 14.0–49.0)
MCH: 28.5 pg (ref 27.2–33.4)
MCHC: 34 g/dL (ref 32.0–36.0)
MCV: 83.8 fL (ref 79.3–98.0)
MONO#: 0.6 10*3/uL (ref 0.1–0.9)
MONO%: 11.2 % (ref 0.0–14.0)
NEUT#: 3 10*3/uL (ref 1.5–6.5)
NEUT%: 58.2 % (ref 39.0–75.0)
Platelets: 143 10*3/uL (ref 140–400)
RBC: 4.55 10*6/uL (ref 4.20–5.82)
RDW: 20.8 % — ABNORMAL HIGH (ref 11.0–14.6)
WBC: 5.2 10*3/uL (ref 4.0–10.3)
lymph#: 1.1 10*3/uL (ref 0.9–3.3)

## 2016-07-25 LAB — COMPREHENSIVE METABOLIC PANEL
ALK PHOS: 118 U/L (ref 40–150)
ALT: 35 U/L (ref 0–55)
AST: 32 U/L (ref 5–34)
Albumin: 3.6 g/dL (ref 3.5–5.0)
Anion Gap: 10 mEq/L (ref 3–11)
BUN: 7.9 mg/dL (ref 7.0–26.0)
CHLORIDE: 108 meq/L (ref 98–109)
CO2: 24 mEq/L (ref 22–29)
Calcium: 9.6 mg/dL (ref 8.4–10.4)
Creatinine: 0.8 mg/dL (ref 0.7–1.3)
GLUCOSE: 180 mg/dL — AB (ref 70–140)
POTASSIUM: 4.1 meq/L (ref 3.5–5.1)
SODIUM: 142 meq/L (ref 136–145)
Total Bilirubin: 0.38 mg/dL (ref 0.20–1.20)
Total Protein: 6.8 g/dL (ref 6.4–8.3)

## 2016-07-25 MED ORDER — DEXTROSE 5 % IV SOLN
Freq: Once | INTRAVENOUS | Status: AC
  Administered 2016-07-25: 13:00:00 via INTRAVENOUS

## 2016-07-25 MED ORDER — SODIUM CHLORIDE 0.9% FLUSH
10.0000 mL | INTRAVENOUS | Status: DC | PRN
  Administered 2016-07-25: 10 mL via INTRAVENOUS
  Filled 2016-07-25: qty 10

## 2016-07-25 MED ORDER — FLUOROURACIL CHEMO INJECTION 2.5 GM/50ML
400.0000 mg/m2 | Freq: Once | INTRAVENOUS | Status: AC
  Administered 2016-07-25: 700 mg via INTRAVENOUS
  Filled 2016-07-25: qty 14

## 2016-07-25 MED ORDER — PALONOSETRON HCL INJECTION 0.25 MG/5ML
0.2500 mg | Freq: Once | INTRAVENOUS | Status: AC
  Administered 2016-07-25: 0.25 mg via INTRAVENOUS

## 2016-07-25 MED ORDER — PALONOSETRON HCL INJECTION 0.25 MG/5ML
INTRAVENOUS | Status: AC
  Filled 2016-07-25: qty 5

## 2016-07-25 MED ORDER — OXALIPLATIN CHEMO INJECTION 100 MG/20ML
84.0000 mg/m2 | Freq: Once | INTRAVENOUS | Status: AC
  Administered 2016-07-25: 150 mg via INTRAVENOUS
  Filled 2016-07-25: qty 10

## 2016-07-25 MED ORDER — SODIUM CHLORIDE 0.9 % IV SOLN
Freq: Once | INTRAVENOUS | Status: AC
  Administered 2016-07-25: 13:00:00 via INTRAVENOUS
  Filled 2016-07-25: qty 5

## 2016-07-25 MED ORDER — SODIUM CHLORIDE 0.9 % IV SOLN
2400.0000 mg/m2 | INTRAVENOUS | Status: DC
  Administered 2016-07-25: 4300 mg via INTRAVENOUS
  Filled 2016-07-25: qty 86

## 2016-07-25 MED ORDER — LEUCOVORIN CALCIUM INJECTION 350 MG
400.0000 mg/m2 | Freq: Once | INTRAMUSCULAR | Status: AC
  Administered 2016-07-25: 720 mg via INTRAVENOUS
  Filled 2016-07-25: qty 36

## 2016-07-25 NOTE — Progress Notes (Signed)
  [place] OFFICE PROGRESS NOTE   Diagnosis: Gastric cancer  INTERVAL HISTORY:   Robert Beck returns as scheduled. He completed another cycle of FOLFOX 07/11/2016. He reports decreased nausea following this cycle of chemotherapy. He has persistent cold sensitivity. He has developed numbness at the tip of the tongue. No diarrhea. No dysphagia.  Objective:  Vital signs in last 24 hours:  Blood pressure (!) 142/88, pulse 90, temperature 97.4 F (36.3 C), temperature source Oral, resp. rate 17, height 5\' 6"  (1.676 m), weight 162 lb 4.8 oz (73.6 kg), SpO2 99 %.    HEENT: No thrush or ulcers Lymphatics: No cervical or supra-clavicular nodes Resp: Lungs clear bilaterally Cardio: Regular rate and rhythm GI: No hepatosplenomegaly, no mass, nontender Vascular: No leg edema Neuro: Very mild decrease in vibratory sense at the fingertips bilaterally    Portacath/PICC-without erythema  Lab Results:  Lab Results  Component Value Date   WBC 5.2 07/25/2016   HGB 13.0 07/25/2016   HCT 38.1 (L) 07/25/2016   MCV 83.8 07/25/2016   PLT 143 07/25/2016   NEUTROABS 3.0 07/25/2016     Medications: I have reviewed the patient's current medications.  Assessment/Plan: 1. Gastric cancer, gastric cardia mass standing to the GE junction confirmed on endoscopy 04/11/2016, biopsy confirmed adenocarcinoma ? Staging CTs of the chest, abdomen, and pelvis 04/09/2016-gastric cardia mass, mediastinal, right hilar, and right supraclavicular lymphadenopathy ? 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 Cycle 1 FOLFOX 04/30/2016 (oxaliplatin given 04/30/2016, 5-FU infusion started 05/02/2016) ? Cycle 2 FOLFOX 05/14/2016 ? Cycle 3 FOLFOX 05/30/2016 ? Cycle 4 FOLFOX 06/13/2016 ? Cycle 5 FOLFOX 06/27/2016 ? Cycle 6 FOLFOX 07/11/2016 ? CTs in 27 2017-decrease in supraclavicular, mediastinal, and gastrohepatic adenopathy. Decreased gastric  cardia wall thickening. ? Cycle 7 FOLFOX 07/25/2016 2. Anemia secondary to #1-improved  3. Diabetes  4. Gout  5. Hypertension  6. Hyperlipidemia  7. History of a T7 compression fracture  8. Pain secondary to the gastric mass and mediastinal lymphadenopathy-resolved  9. Port-A-Cath placement 04/20/2016  10. 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 He has completed 6 cycles of FOLFOX with marked clinical and radiologic improvement. The plan is to complete approximately 3 more cycles of FOLFOX. We will then decide on maintenance therapy.  He will complete another cycle of FOLFOX today. Robert Beck will return for an office visit and chemotherapy in 2 weeks.  Betsy Coder, MD  07/25/2016  12:09 PM

## 2016-07-25 NOTE — Patient Instructions (Signed)
[facility] Discharge Instructions for Patients Receiving Chemotherapy  Today you received the following chemotherapy agents: Oxaliplatin, Leucovorin and Adrucil.  To help prevent nausea and vomiting after your treatment, we encourage you to take your nausea medication as directed. No Zofran for 3 days. Take Compazine instead.    If you develop nausea and vomiting that is not controlled by your nausea medication, call the clinic.   BELOW ARE SYMPTOMS THAT SHOULD BE REPORTED IMMEDIATELY:  *FEVER GREATER THAN 100.5 F  *CHILLS WITH OR WITHOUT FEVER  NAUSEA AND VOMITING THAT IS NOT CONTROLLED WITH YOUR NAUSEA MEDICATION  *UNUSUAL SHORTNESS OF BREATH  *UNUSUAL BRUISING OR BLEEDING  TENDERNESS IN MOUTH AND THROAT WITH OR WITHOUT PRESENCE OF ULCERS  *URINARY PROBLEMS  *BOWEL PROBLEMS  UNUSUAL RASH Items with * indicate a potential emergency and should be followed up as soon as possible.  Feel free to call the clinic you have any questions or concerns. The clinic phone number is [phone].  Please show the CHEMO ALERT CARD at check-in to the Emergency Department and triage nurse.   

## 2016-07-26 ENCOUNTER — Telehealth: Payer: Self-pay | Admitting: *Deleted

## 2016-07-26 NOTE — Telephone Encounter (Signed)
Per LOS I have scheduled appts and notified the scheduler 

## 2016-07-27 ENCOUNTER — Ambulatory Visit (HOSPITAL_BASED_OUTPATIENT_CLINIC_OR_DEPARTMENT_OTHER): Payer: BLUE CROSS/BLUE SHIELD

## 2016-07-27 VITALS — BP 129/74 | HR 91 | Temp 97.7°F | Resp 18

## 2016-07-27 DIAGNOSIS — C770 Secondary and unspecified malignant neoplasm of lymph nodes of head, face and neck: Secondary | ICD-10-CM | POA: Diagnosis not present

## 2016-07-27 DIAGNOSIS — C160 Malignant neoplasm of cardia: Secondary | ICD-10-CM | POA: Diagnosis not present

## 2016-07-27 MED ORDER — HEPARIN SOD (PORK) LOCK FLUSH 100 UNIT/ML IV SOLN
500.0000 [IU] | Freq: Once | INTRAVENOUS | Status: AC | PRN
  Administered 2016-07-27: 500 [IU]
  Filled 2016-07-27: qty 5

## 2016-07-27 MED ORDER — SODIUM CHLORIDE 0.9% FLUSH
10.0000 mL | INTRAVENOUS | Status: DC | PRN
  Administered 2016-07-27: 10 mL
  Filled 2016-07-27: qty 10

## 2016-08-01 ENCOUNTER — Telehealth: Payer: Self-pay | Admitting: Gastroenterology

## 2016-08-05 ENCOUNTER — Other Ambulatory Visit: Payer: Self-pay | Admitting: Oncology

## 2016-08-08 ENCOUNTER — Other Ambulatory Visit (HOSPITAL_BASED_OUTPATIENT_CLINIC_OR_DEPARTMENT_OTHER): Payer: BLUE CROSS/BLUE SHIELD

## 2016-08-08 ENCOUNTER — Ambulatory Visit (HOSPITAL_BASED_OUTPATIENT_CLINIC_OR_DEPARTMENT_OTHER): Payer: BLUE CROSS/BLUE SHIELD

## 2016-08-08 ENCOUNTER — Ambulatory Visit (HOSPITAL_BASED_OUTPATIENT_CLINIC_OR_DEPARTMENT_OTHER): Payer: BLUE CROSS/BLUE SHIELD | Admitting: Nurse Practitioner

## 2016-08-08 ENCOUNTER — Telehealth: Payer: Self-pay | Admitting: Oncology

## 2016-08-08 ENCOUNTER — Ambulatory Visit: Payer: BLUE CROSS/BLUE SHIELD

## 2016-08-08 VITALS — BP 126/81 | HR 89 | Temp 97.8°F | Resp 18 | Wt 163.3 lb

## 2016-08-08 DIAGNOSIS — I10 Essential (primary) hypertension: Secondary | ICD-10-CM

## 2016-08-08 DIAGNOSIS — C160 Malignant neoplasm of cardia: Secondary | ICD-10-CM

## 2016-08-08 DIAGNOSIS — C770 Secondary and unspecified malignant neoplasm of lymph nodes of head, face and neck: Secondary | ICD-10-CM

## 2016-08-08 DIAGNOSIS — E119 Type 2 diabetes mellitus without complications: Secondary | ICD-10-CM

## 2016-08-08 DIAGNOSIS — Z95828 Presence of other vascular implants and grafts: Secondary | ICD-10-CM

## 2016-08-08 DIAGNOSIS — G620 Drug-induced polyneuropathy: Secondary | ICD-10-CM

## 2016-08-08 DIAGNOSIS — Z5111 Encounter for antineoplastic chemotherapy: Secondary | ICD-10-CM

## 2016-08-08 LAB — CBC WITH DIFFERENTIAL/PLATELET
BASO%: 0.8 % (ref 0.0–2.0)
Basophils Absolute: 0 10*3/uL (ref 0.0–0.1)
EOS%: 5.8 % (ref 0.0–7.0)
Eosinophils Absolute: 0.3 10*3/uL (ref 0.0–0.5)
HCT: 38.8 % (ref 38.4–49.9)
HGB: 13 g/dL (ref 13.0–17.1)
LYMPH%: 22.8 % (ref 14.0–49.0)
MCH: 28.7 pg (ref 27.2–33.4)
MCHC: 33.5 g/dL (ref 32.0–36.0)
MCV: 85.6 fL (ref 79.3–98.0)
MONO#: 0.6 10*3/uL (ref 0.1–0.9)
MONO%: 10.7 % (ref 0.0–14.0)
NEUT#: 3.6 10*3/uL (ref 1.5–6.5)
NEUT%: 59.9 % (ref 39.0–75.0)
Platelets: 148 10*3/uL (ref 140–400)
RBC: 4.53 10*6/uL (ref 4.20–5.82)
RDW: 19.7 % — ABNORMAL HIGH (ref 11.0–14.6)
WBC: 5.9 10*3/uL (ref 4.0–10.3)
lymph#: 1.4 10*3/uL (ref 0.9–3.3)

## 2016-08-08 LAB — COMPREHENSIVE METABOLIC PANEL
ALBUMIN: 3.7 g/dL (ref 3.5–5.0)
ALK PHOS: 111 U/L (ref 40–150)
ALT: 33 U/L (ref 0–55)
ANION GAP: 13 meq/L — AB (ref 3–11)
AST: 29 U/L (ref 5–34)
BILIRUBIN TOTAL: 0.64 mg/dL (ref 0.20–1.20)
BUN: 9 mg/dL (ref 7.0–26.0)
CO2: 21 meq/L — AB (ref 22–29)
CREATININE: 0.8 mg/dL (ref 0.7–1.3)
Calcium: 9.4 mg/dL (ref 8.4–10.4)
Chloride: 105 mEq/L (ref 98–109)
GLUCOSE: 349 mg/dL — AB (ref 70–140)
Potassium: 4.1 mEq/L (ref 3.5–5.1)
SODIUM: 140 meq/L (ref 136–145)
TOTAL PROTEIN: 6.5 g/dL (ref 6.4–8.3)

## 2016-08-08 MED ORDER — SODIUM CHLORIDE 0.9 % IV SOLN
Freq: Once | INTRAVENOUS | Status: AC
  Administered 2016-08-08: 13:00:00 via INTRAVENOUS
  Filled 2016-08-08: qty 5

## 2016-08-08 MED ORDER — SODIUM CHLORIDE 0.9 % IV SOLN
2400.0000 mg/m2 | INTRAVENOUS | Status: DC
  Administered 2016-08-08: 4300 mg via INTRAVENOUS
  Filled 2016-08-08: qty 86

## 2016-08-08 MED ORDER — PALONOSETRON HCL INJECTION 0.25 MG/5ML
0.2500 mg | Freq: Once | INTRAVENOUS | Status: AC
  Administered 2016-08-08: 0.25 mg via INTRAVENOUS

## 2016-08-08 MED ORDER — FLUOROURACIL CHEMO INJECTION 2.5 GM/50ML
400.0000 mg/m2 | Freq: Once | INTRAVENOUS | Status: AC
  Administered 2016-08-08: 700 mg via INTRAVENOUS
  Filled 2016-08-08: qty 14

## 2016-08-08 MED ORDER — DEXTROSE 5 % IV SOLN
Freq: Once | INTRAVENOUS | Status: AC
  Administered 2016-08-08: 12:00:00 via INTRAVENOUS

## 2016-08-08 MED ORDER — OXALIPLATIN CHEMO INJECTION 100 MG/20ML
84.0000 mg/m2 | Freq: Once | INTRAVENOUS | Status: AC
  Administered 2016-08-08: 150 mg via INTRAVENOUS
  Filled 2016-08-08: qty 20

## 2016-08-08 MED ORDER — SODIUM CHLORIDE 0.9% FLUSH
10.0000 mL | INTRAVENOUS | Status: DC | PRN
  Administered 2016-08-08: 10 mL via INTRAVENOUS
  Filled 2016-08-08: qty 10

## 2016-08-08 MED ORDER — PALONOSETRON HCL INJECTION 0.25 MG/5ML
INTRAVENOUS | Status: AC
Start: 2016-08-08 — End: 2016-08-08
  Filled 2016-08-08: qty 5

## 2016-08-08 MED ORDER — LEUCOVORIN CALCIUM INJECTION 350 MG
400.0000 mg/m2 | Freq: Once | INTRAVENOUS | Status: AC
  Administered 2016-08-08: 720 mg via INTRAVENOUS
  Filled 2016-08-08: qty 36

## 2016-08-08 NOTE — Addendum Note (Signed)
Addended by: Tyler Aas A on: 08/08/2016 11:25 AM   Modules accepted: Orders

## 2016-08-08 NOTE — Patient Instructions (Signed)
[facility] Discharge Instructions for Patients Receiving Chemotherapy  Today you received the following chemotherapy agents:  Oxaliplatin, Leucovorin, Fluorouracil  To help prevent nausea and vomiting after your treatment, we encourage you to take your nausea medication as prescribed.   If you develop nausea and vomiting that is not controlled by your nausea medication, call the clinic.   BELOW ARE SYMPTOMS THAT SHOULD BE REPORTED IMMEDIATELY:  *FEVER GREATER THAN 100.5 F  *CHILLS WITH OR WITHOUT FEVER  NAUSEA AND VOMITING THAT IS NOT CONTROLLED WITH YOUR NAUSEA MEDICATION  *UNUSUAL SHORTNESS OF BREATH  *UNUSUAL BRUISING OR BLEEDING  TENDERNESS IN MOUTH AND THROAT WITH OR WITHOUT PRESENCE OF ULCERS  *URINARY PROBLEMS  *BOWEL PROBLEMS  UNUSUAL RASH Items with * indicate a potential emergency and should be followed up as soon as possible.  Feel free to call the clinic you have any questions or concerns. The clinic phone number is [phone].  Please show the CHEMO ALERT CARD at check-in to the Emergency Department and triage nurse.   

## 2016-08-08 NOTE — Progress Notes (Signed)
  [place] OFFICE PROGRESS NOTE   Diagnosis:  Gastric cancer  INTERVAL HISTORY:   Robert. Robert Beck returns as scheduled. He completed cycle 7 FOLFOX 07/25/2016. He noted a mild increase in nausea following the most recent chemotherapy. The nausea did not require his home anti-emetic. No mouth sores. Mild intermittent loose stools. He has persistent cold sensitivity. He has mild numbness in the tongue and lips.  Objective:  Vital signs in last 24 hours:  Blood pressure 126/81, pulse 89, temperature 97.8 F (36.6 C), temperature source Oral, resp. rate 18, weight 163 lb 4.8 oz (74.1 kg), SpO2 98 %.    HEENT: No thrush or ulcers. Lymphatics: No palpable cervical or supra-clavicular lymph nodes. Resp: Lungs clear bilaterally. Cardio: Regular rate and rhythm. GI: Abdomen soft and nontender. No hepatomegaly. Vascular: No leg edema. Neuro: Mild decrease in vibratory sense over the fingertips per tuning fork exam.  Port-A-Cath without erythema.  Lab Results:  Lab Results  Component Value Date   WBC 5.9 08/08/2016   HGB 13.0 08/08/2016   HCT 38.8 08/08/2016   MCV 85.6 08/08/2016   PLT 148 08/08/2016   NEUTROABS 3.6 08/08/2016    Imaging:  No results found.  Medications: I have reviewed the patient's current medications.  Assessment/Plan: 1. Gastric cancer, gastric cardia mass standing to the GE junction confirmed on endoscopy 04/11/2016, biopsy confirmed adenocarcinoma ? Staging CTs of the chest, abdomen, and pelvis 04/09/2016-gastric cardia mass, mediastinal, right hilar, and right supraclavicular lymphadenopathy ? 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 Cycle 1 FOLFOX 04/30/2016 (oxaliplatin given 04/30/2016, 5-FU infusion started 05/02/2016) ? Cycle 2 FOLFOX 05/14/2016 ? Cycle 3 FOLFOX 05/30/2016 ? Cycle 4 FOLFOX 06/13/2016 ? Cycle 5 FOLFOX 06/27/2016 ? Cycle 6 FOLFOX 07/11/2016 ? CTs  07/20/2016-decrease in supraclavicular, mediastinal, and gastrohepatic adenopathy. Decreased gastric cardia wall thickening. ? Cycle 7 FOLFOX 07/25/2016 ? Cycle 8 FOLFOX 08/08/2016 2. Anemia secondary to #1-improved  3. Diabetes  4. Gout  5. Hypertension  6. Hyperlipidemia  7. History of a T7 compression fracture  8. Pain secondary to the gastric mass and mediastinal lymphadenopathy-resolved  9. Port-A-Cath placement 04/20/2016  10. 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 He has completed 7 cycles of FOLFOX. Plan to proceed with cycle 8 today as scheduled. He will return for a follow-up visit and cycle 9 in 2 weeks. He will contact the office in the interim with any problems.    Ned Card ANP/GNP-BC   08/08/2016  10:51 AM

## 2016-08-08 NOTE — Telephone Encounter (Signed)
Message sent to chemo scheduler to be added per 08/08/16 los. Follow up appointments and labs scheduled, per 08/08/16 los. A copy of the  AVS report and appointment schedule, was given to patient per 08/08/16 los.

## 2016-08-09 ENCOUNTER — Telehealth: Payer: Self-pay | Admitting: *Deleted

## 2016-08-09 NOTE — Telephone Encounter (Signed)
Per LOS I have scheduled appts and notified the scheduler 

## 2016-08-10 ENCOUNTER — Ambulatory Visit (HOSPITAL_BASED_OUTPATIENT_CLINIC_OR_DEPARTMENT_OTHER): Payer: BLUE CROSS/BLUE SHIELD

## 2016-08-10 DIAGNOSIS — C160 Malignant neoplasm of cardia: Secondary | ICD-10-CM | POA: Diagnosis not present

## 2016-08-10 DIAGNOSIS — C770 Secondary and unspecified malignant neoplasm of lymph nodes of head, face and neck: Secondary | ICD-10-CM

## 2016-08-10 MED ORDER — HEPARIN SOD (PORK) LOCK FLUSH 100 UNIT/ML IV SOLN
500.0000 [IU] | Freq: Once | INTRAVENOUS | Status: AC | PRN
  Administered 2016-08-10: 500 [IU]
  Filled 2016-08-10: qty 5

## 2016-08-10 MED ORDER — SODIUM CHLORIDE 0.9% FLUSH
10.0000 mL | INTRAVENOUS | Status: DC | PRN
  Administered 2016-08-10: 10 mL
  Filled 2016-08-10: qty 10

## 2016-08-19 ENCOUNTER — Encounter: Payer: Self-pay | Admitting: Oncology

## 2016-08-22 ENCOUNTER — Ambulatory Visit: Payer: BLUE CROSS/BLUE SHIELD

## 2016-08-22 ENCOUNTER — Ambulatory Visit (HOSPITAL_BASED_OUTPATIENT_CLINIC_OR_DEPARTMENT_OTHER): Payer: BLUE CROSS/BLUE SHIELD | Admitting: Nurse Practitioner

## 2016-08-22 ENCOUNTER — Ambulatory Visit (HOSPITAL_BASED_OUTPATIENT_CLINIC_OR_DEPARTMENT_OTHER): Payer: BLUE CROSS/BLUE SHIELD

## 2016-08-22 ENCOUNTER — Other Ambulatory Visit (HOSPITAL_BASED_OUTPATIENT_CLINIC_OR_DEPARTMENT_OTHER): Payer: BLUE CROSS/BLUE SHIELD

## 2016-08-22 VITALS — BP 115/77 | HR 77 | Temp 98.1°F | Resp 17 | Ht 66.0 in | Wt 158.1 lb

## 2016-08-22 DIAGNOSIS — C770 Secondary and unspecified malignant neoplasm of lymph nodes of head, face and neck: Secondary | ICD-10-CM

## 2016-08-22 DIAGNOSIS — Z5111 Encounter for antineoplastic chemotherapy: Secondary | ICD-10-CM

## 2016-08-22 DIAGNOSIS — I10 Essential (primary) hypertension: Secondary | ICD-10-CM

## 2016-08-22 DIAGNOSIS — D630 Anemia in neoplastic disease: Secondary | ICD-10-CM

## 2016-08-22 DIAGNOSIS — E119 Type 2 diabetes mellitus without complications: Secondary | ICD-10-CM

## 2016-08-22 DIAGNOSIS — R110 Nausea: Secondary | ICD-10-CM | POA: Diagnosis not present

## 2016-08-22 DIAGNOSIS — C160 Malignant neoplasm of cardia: Secondary | ICD-10-CM

## 2016-08-22 DIAGNOSIS — G620 Drug-induced polyneuropathy: Secondary | ICD-10-CM | POA: Diagnosis not present

## 2016-08-22 DIAGNOSIS — E785 Hyperlipidemia, unspecified: Secondary | ICD-10-CM

## 2016-08-22 DIAGNOSIS — Z95828 Presence of other vascular implants and grafts: Secondary | ICD-10-CM

## 2016-08-22 LAB — CBC WITH DIFFERENTIAL/PLATELET
BASO%: 0.9 % (ref 0.0–2.0)
BASOS ABS: 0 10*3/uL (ref 0.0–0.1)
EOS ABS: 0.2 10*3/uL (ref 0.0–0.5)
EOS%: 4 % (ref 0.0–7.0)
HCT: 38.6 % (ref 38.4–49.9)
HGB: 13.1 g/dL (ref 13.0–17.1)
LYMPH%: 20.9 % (ref 14.0–49.0)
MCH: 29.2 pg (ref 27.2–33.4)
MCHC: 33.9 g/dL (ref 32.0–36.0)
MCV: 86.3 fL (ref 79.3–98.0)
MONO#: 0.6 10*3/uL (ref 0.1–0.9)
MONO%: 12.2 % (ref 0.0–14.0)
NEUT#: 3.3 10*3/uL (ref 1.5–6.5)
NEUT%: 62 % (ref 39.0–75.0)
PLATELETS: 133 10*3/uL — AB (ref 140–400)
RBC: 4.47 10*6/uL (ref 4.20–5.82)
RDW: 19.3 % — ABNORMAL HIGH (ref 11.0–14.6)
WBC: 5.3 10*3/uL (ref 4.0–10.3)
lymph#: 1.1 10*3/uL (ref 0.9–3.3)

## 2016-08-22 LAB — COMPREHENSIVE METABOLIC PANEL
ALBUMIN: 3.6 g/dL (ref 3.5–5.0)
ALK PHOS: 106 U/L (ref 40–150)
ALT: 27 U/L (ref 0–55)
ANION GAP: 12 meq/L — AB (ref 3–11)
AST: 26 U/L (ref 5–34)
BUN: 12 mg/dL (ref 7.0–26.0)
CO2: 21 meq/L — AB (ref 22–29)
Calcium: 9.6 mg/dL (ref 8.4–10.4)
Chloride: 106 mEq/L (ref 98–109)
Creatinine: 1 mg/dL (ref 0.7–1.3)
EGFR: 87 mL/min/{1.73_m2} — AB (ref >90)
GLUCOSE: 464 mg/dL — AB (ref 70–140)
POTASSIUM: 4.2 meq/L (ref 3.5–5.1)
SODIUM: 138 meq/L (ref 136–145)
Total Bilirubin: 1.73 mg/dL — ABNORMAL HIGH (ref 0.20–1.20)
Total Protein: 7.1 g/dL (ref 6.4–8.3)

## 2016-08-22 MED ORDER — SODIUM CHLORIDE 0.9% FLUSH
10.0000 mL | INTRAVENOUS | Status: DC | PRN
  Filled 2016-08-22: qty 10

## 2016-08-22 MED ORDER — LEUCOVORIN CALCIUM INJECTION 350 MG
400.0000 mg/m2 | Freq: Once | INTRAVENOUS | Status: AC
  Administered 2016-08-22: 720 mg via INTRAVENOUS
  Filled 2016-08-22: qty 36

## 2016-08-22 MED ORDER — SODIUM CHLORIDE 0.9 % IV SOLN
2400.0000 mg/m2 | INTRAVENOUS | Status: DC
  Administered 2016-08-22: 4300 mg via INTRAVENOUS
  Filled 2016-08-22: qty 86

## 2016-08-22 MED ORDER — HEPARIN SOD (PORK) LOCK FLUSH 100 UNIT/ML IV SOLN
500.0000 [IU] | Freq: Once | INTRAVENOUS | Status: DC | PRN
  Filled 2016-08-22: qty 5

## 2016-08-22 MED ORDER — SODIUM CHLORIDE 0.9% FLUSH
10.0000 mL | INTRAVENOUS | Status: DC | PRN
  Administered 2016-08-22: 10 mL via INTRAVENOUS
  Filled 2016-08-22: qty 10

## 2016-08-22 MED ORDER — FLUOROURACIL CHEMO INJECTION 2.5 GM/50ML
400.0000 mg/m2 | Freq: Once | INTRAVENOUS | Status: AC
  Administered 2016-08-22: 700 mg via INTRAVENOUS
  Filled 2016-08-22: qty 14

## 2016-08-22 MED ORDER — SODIUM CHLORIDE 0.9 % IV SOLN
Freq: Once | INTRAVENOUS | Status: AC
  Administered 2016-08-22: 11:00:00 via INTRAVENOUS

## 2016-08-22 NOTE — Patient Instructions (Addendum)
[place] Discharge Instructions for Patients Receiving Chemotherapy  Today you received the following chemotherapy agents :  Leucovorin and Fluorouracil.  To help prevent nausea and vomiting after your treatment, we encourage you to take your nausea medication as prescribed.   If you develop nausea and vomiting that is not controlled by your nausea medication, call the clinic.   BELOW ARE SYMPTOMS THAT SHOULD BE REPORTED IMMEDIATELY:  *FEVER GREATER THAN 100.5 F  *CHILLS WITH OR WITHOUT FEVER  NAUSEA AND VOMITING THAT IS NOT CONTROLLED WITH YOUR NAUSEA MEDICATION  *UNUSUAL SHORTNESS OF BREATH  *UNUSUAL BRUISING OR BLEEDING  TENDERNESS IN MOUTH AND THROAT WITH OR WITHOUT PRESENCE OF ULCERS  *URINARY PROBLEMS  *BOWEL PROBLEMS  UNUSUAL RASH Items with * indicate a potential emergency and should be followed up as soon as possible.  Feel free to call the clinic you have any questions or concerns. The clinic phone number is [phone].  Please show the [place] at check-in to the Emergency Department and triage nurse.

## 2016-08-22 NOTE — Progress Notes (Addendum)
Robert Beck   Diagnosis:  Gastric cancer  INTERVAL HISTORY:   Robert Beck returns as scheduled. He completed cycle 8 FOLFOX 08/08/2016. He had no nausea, vomiting, diarrhea around the time of the chemotherapy. He developed a single mouth sore recently which has resolved. He has persistent numbness in the fingertips and toes, increased from the last treatment. The numbness does not interfere with activity. He forgot to take his insulin this morning. He is aware the blood sugar is elevated.  On 08/19/2016 he developed chest pain, abdominal pain, chills, nausea, diarrhea and dark stools. Symptoms improved over the next 2-3 days. He continues to have mild intermittent nausea. He had a bowel movement this morning described as "normal".  Objective:  Vital signs in last 24 hours:  Blood pressure 115/77, pulse 77, temperature 98.1 F (36.7 C), temperature source Oral, resp. rate 17, height 5\' 6"  (1.676 m), weight 158 lb 1.6 oz (71.7 kg), SpO2 99 %.    HEENT: No thrush or ulcers. Lymphatics: No palpable cervical or supra-clavicular lymph nodes. Resp: Lungs clear bilaterally. Cardio: Regular rate and rhythm. GI: Abdomen soft and nontender. No hepatomegaly. No mass. Vascular: No leg edema. Neuro: Vibratory sense intact over the fingertips per tuning fork exam. Port-A-Cath without erythema.     Lab Results:  Lab Results  Component Value Date   WBC 5.3 08/22/2016   HGB 13.1 08/22/2016   HCT 38.6 08/22/2016   MCV 86.3 08/22/2016   PLT 133 (L) 08/22/2016   NEUTROABS 3.3 08/22/2016    Imaging:  No results found.  Medications: I have reviewed the patient's current medications.  Assessment/Plan: 1. Gastric cancer, gastric cardia mass standing to the GE junction confirmed on endoscopy 04/11/2016, biopsy confirmed adenocarcinoma ? Staging CTs of the chest, abdomen, and pelvis 04/09/2016-gastric cardia mass, mediastinal, right hilar, and right  supraclavicular lymphadenopathy ? 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 Cycle 1 FOLFOX 04/30/2016 (oxaliplatin given 04/30/2016, 5-FU infusion started 05/02/2016) ? Cycle 2 FOLFOX 05/14/2016 ? Cycle 3 FOLFOX 05/30/2016 ? Cycle 4 FOLFOX 06/13/2016 ? Cycle 5 FOLFOX 06/27/2016 ? Cycle 6 FOLFOX 07/11/2016 ? CTs 07/20/2016-decrease in supraclavicular, mediastinal, and gastrohepatic adenopathy. Decreased gastric cardia wall thickening. ? Cycle 7 FOLFOX 07/25/2016 ? Cycle 8 FOLFOX 08/08/2016 ? Cycle 9 FOLFOX 08/22/2016 (oxaliplatin held due to neuropathy) 2. Anemia secondary to #1-improved  3. Diabetes  4. Gout  5. Hypertension  6. Hyperlipidemia  7. History of a T7 compression fracture  8. Pain secondary to the gastric mass and mediastinal lymphadenopathy-resolved  9. Port-A-Cath placement 04/20/2016  10. 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 He has completed 8 cycles of FOLFOX. Plan to proceed with cycle 9 today as scheduled. Oxaliplatin will be held due to increased neuropathy symptoms.  We discussed the elevated blood sugar. He did not take his insulin this morning. He will monitor blood sugars closely at home and notify his PCP if he is unable to adequately control.  He understands to contact the office if the symptoms he experienced over the weekend (chest and abdominal pain, nausea, diarrhea and dark stools) occur again and we will make a referral to GI. Of Beck, hemoglobin is stable.  He will return for a follow-up visit and cycle 10 FOLFOX in 2 weeks. He will contact the office  in the interim as outlined above or with any other problems.  Patient seen with Dr. Benay Spice. 25 minutes were spent  face-to-face at today's visit with the majority of that time involved in counseling/coordination of care.    Ned Card ANP/GNP-BC   08/22/2016  10:34 AM  This was a shared visit with Ned Card. Robert Beck has progressive neuropathy symptoms. The etiology of the symptoms he developed 08/19/2016 is unclear. He will contact us for recurrent pain, nausea, or dark stool. Oxaliplatin will be held from the chemotherapy regimen today.  Julieanne Manson, M.D.

## 2016-08-24 ENCOUNTER — Ambulatory Visit (HOSPITAL_BASED_OUTPATIENT_CLINIC_OR_DEPARTMENT_OTHER): Payer: BLUE CROSS/BLUE SHIELD

## 2016-08-24 VITALS — BP 159/98 | HR 107 | Temp 98.6°F | Resp 18

## 2016-08-24 DIAGNOSIS — C160 Malignant neoplasm of cardia: Secondary | ICD-10-CM

## 2016-08-24 DIAGNOSIS — C770 Secondary and unspecified malignant neoplasm of lymph nodes of head, face and neck: Secondary | ICD-10-CM

## 2016-08-24 MED ORDER — HEPARIN SOD (PORK) LOCK FLUSH 100 UNIT/ML IV SOLN
500.0000 [IU] | Freq: Once | INTRAVENOUS | Status: AC | PRN
  Administered 2016-08-24: 500 [IU]
  Filled 2016-08-24: qty 5

## 2016-08-24 MED ORDER — SODIUM CHLORIDE 0.9% FLUSH
10.0000 mL | INTRAVENOUS | Status: DC | PRN
  Administered 2016-08-24: 10 mL
  Filled 2016-08-24: qty 10

## 2016-08-24 NOTE — Progress Notes (Signed)
Patient blood pressure elevated. Patient states he didn't take his blood pressure medication this morning. Patient states he will take his blood pressure medication when he gets home. Patient verbalized understanding to call our office with any further questions or concerns.

## 2016-08-24 NOTE — Patient Instructions (Signed)

## 2016-09-02 ENCOUNTER — Other Ambulatory Visit: Payer: Self-pay | Admitting: Oncology

## 2016-09-05 ENCOUNTER — Other Ambulatory Visit (HOSPITAL_BASED_OUTPATIENT_CLINIC_OR_DEPARTMENT_OTHER): Payer: BLUE CROSS/BLUE SHIELD

## 2016-09-05 ENCOUNTER — Telehealth: Payer: Self-pay | Admitting: Oncology

## 2016-09-05 ENCOUNTER — Ambulatory Visit (HOSPITAL_BASED_OUTPATIENT_CLINIC_OR_DEPARTMENT_OTHER): Payer: BLUE CROSS/BLUE SHIELD

## 2016-09-05 ENCOUNTER — Encounter: Payer: Self-pay | Admitting: *Deleted

## 2016-09-05 ENCOUNTER — Ambulatory Visit (HOSPITAL_BASED_OUTPATIENT_CLINIC_OR_DEPARTMENT_OTHER): Payer: BLUE CROSS/BLUE SHIELD | Admitting: Oncology

## 2016-09-05 ENCOUNTER — Ambulatory Visit: Payer: BLUE CROSS/BLUE SHIELD

## 2016-09-05 VITALS — BP 131/88 | HR 97 | Temp 98.4°F | Resp 18 | Ht 66.0 in | Wt 168.1 lb

## 2016-09-05 DIAGNOSIS — G620 Drug-induced polyneuropathy: Secondary | ICD-10-CM | POA: Diagnosis not present

## 2016-09-05 DIAGNOSIS — Z5111 Encounter for antineoplastic chemotherapy: Secondary | ICD-10-CM

## 2016-09-05 DIAGNOSIS — C770 Secondary and unspecified malignant neoplasm of lymph nodes of head, face and neck: Secondary | ICD-10-CM | POA: Diagnosis not present

## 2016-09-05 DIAGNOSIS — D630 Anemia in neoplastic disease: Secondary | ICD-10-CM | POA: Diagnosis not present

## 2016-09-05 DIAGNOSIS — C160 Malignant neoplasm of cardia: Secondary | ICD-10-CM | POA: Diagnosis not present

## 2016-09-05 DIAGNOSIS — I10 Essential (primary) hypertension: Secondary | ICD-10-CM

## 2016-09-05 DIAGNOSIS — E119 Type 2 diabetes mellitus without complications: Secondary | ICD-10-CM

## 2016-09-05 DIAGNOSIS — Z95828 Presence of other vascular implants and grafts: Secondary | ICD-10-CM

## 2016-09-05 DIAGNOSIS — E785 Hyperlipidemia, unspecified: Secondary | ICD-10-CM

## 2016-09-05 LAB — CBC WITH DIFFERENTIAL/PLATELET
BASO%: 0.8 % (ref 0.0–2.0)
Basophils Absolute: 0 10*3/uL (ref 0.0–0.1)
EOS%: 4.5 % (ref 0.0–7.0)
Eosinophils Absolute: 0.3 10*3/uL (ref 0.0–0.5)
HCT: 38.1 % — ABNORMAL LOW (ref 38.4–49.9)
HGB: 12.8 g/dL — ABNORMAL LOW (ref 13.0–17.1)
LYMPH%: 22.1 % (ref 14.0–49.0)
MCH: 30.2 pg (ref 27.2–33.4)
MCHC: 33.5 g/dL (ref 32.0–36.0)
MCV: 90.2 fL (ref 79.3–98.0)
MONO#: 0.8 10*3/uL (ref 0.1–0.9)
MONO%: 13.1 % (ref 0.0–14.0)
NEUT%: 59.5 % (ref 39.0–75.0)
NEUTROS ABS: 3.5 10*3/uL (ref 1.5–6.5)
PLATELETS: 154 10*3/uL (ref 140–400)
RBC: 4.23 10*6/uL (ref 4.20–5.82)
RDW: 19.2 % — ABNORMAL HIGH (ref 11.0–14.6)
WBC: 5.8 10*3/uL (ref 4.0–10.3)
lymph#: 1.3 10*3/uL (ref 0.9–3.3)

## 2016-09-05 LAB — COMPREHENSIVE METABOLIC PANEL
ALT: 25 U/L (ref 0–55)
AST: 24 U/L (ref 5–34)
Albumin: 3.5 g/dL (ref 3.5–5.0)
Alkaline Phosphatase: 96 U/L (ref 40–150)
Anion Gap: 10 mEq/L (ref 3–11)
BILIRUBIN TOTAL: 0.68 mg/dL (ref 0.20–1.20)
BUN: 4.9 mg/dL — ABNORMAL LOW (ref 7.0–26.0)
CO2: 24 meq/L (ref 22–29)
CREATININE: 0.7 mg/dL (ref 0.7–1.3)
Calcium: 9.5 mg/dL (ref 8.4–10.4)
Chloride: 108 mEq/L (ref 98–109)
EGFR: >90 mL/min/{1.73_m2} (ref >90)
GLUCOSE: 147 mg/dL — AB (ref 70–140)
Potassium: 3.6 mEq/L (ref 3.5–5.1)
SODIUM: 142 meq/L (ref 136–145)
TOTAL PROTEIN: 6.6 g/dL (ref 6.4–8.3)

## 2016-09-05 MED ORDER — SODIUM CHLORIDE 0.9 % IV SOLN
INTRAVENOUS | Status: DC
  Administered 2016-09-05: 12:00:00 via INTRAVENOUS

## 2016-09-05 MED ORDER — FLUOROURACIL CHEMO INJECTION 5 GM/100ML
2400.0000 mg/m2 | INTRAVENOUS | Status: DC
  Administered 2016-09-05: 4300 mg via INTRAVENOUS
  Filled 2016-09-05: qty 86

## 2016-09-05 MED ORDER — FLUOROURACIL CHEMO INJECTION 2.5 GM/50ML
400.0000 mg/m2 | Freq: Once | INTRAVENOUS | Status: AC
  Administered 2016-09-05: 700 mg via INTRAVENOUS
  Filled 2016-09-05: qty 14

## 2016-09-05 MED ORDER — SODIUM CHLORIDE 0.9% FLUSH
10.0000 mL | INTRAVENOUS | Status: DC | PRN
  Filled 2016-09-05: qty 10

## 2016-09-05 MED ORDER — LEUCOVORIN CALCIUM INJECTION 350 MG
400.0000 mg/m2 | Freq: Once | INTRAMUSCULAR | Status: AC
  Administered 2016-09-05: 720 mg via INTRAVENOUS
  Filled 2016-09-05: qty 36

## 2016-09-05 MED ORDER — SODIUM CHLORIDE 0.9% FLUSH
10.0000 mL | INTRAVENOUS | Status: DC | PRN
  Administered 2016-09-05: 10 mL via INTRAVENOUS
  Filled 2016-09-05: qty 10

## 2016-09-05 NOTE — Progress Notes (Signed)
Oncology Nurse Navigator Documentation  Oncology Nurse Navigator Flowsheets 09/05/2016  Navigator Location [facility]  Referral date to RadOnc/MedOnc -  Navigator Encounter Type Treatment  Telephone -  Abnormal Finding Date -  Confirmed Diagnosis Date -  Treatment Initiated Date -  Patient Visit Type MedOnc  Treatment Phase Active Tx--FOLFOX (without oxaliplatin)  Barriers/Navigation Needs No barriers at this time;No Questions;No Needs  Interventions None required  Referrals -  Coordination of Care -  Education Method -  Support Groups/Services -Reports spirits are good and has no complaints  Acuity -  Time Spent with Patient 15

## 2016-09-05 NOTE — Telephone Encounter (Signed)
Gave patient avs report and appointments for December and January  °

## 2016-09-05 NOTE — Patient Instructions (Signed)
[facility] Discharge Instructions for Patients Receiving Chemotherapy  Today you received the following chemotherapy agents :  Leucovorin and Fluorouracil.  To help prevent nausea and vomiting after your treatment, we encourage you to take your nausea medication as prescribed.   If you develop nausea and vomiting that is not controlled by your nausea medication, call the clinic.   BELOW ARE SYMPTOMS THAT SHOULD BE REPORTED IMMEDIATELY:  *FEVER GREATER THAN 100.5 F  *CHILLS WITH OR WITHOUT FEVER  NAUSEA AND VOMITING THAT IS NOT CONTROLLED WITH YOUR NAUSEA MEDICATION  *UNUSUAL SHORTNESS OF BREATH  *UNUSUAL BRUISING OR BLEEDING  TENDERNESS IN MOUTH AND THROAT WITH OR WITHOUT PRESENCE OF ULCERS  *URINARY PROBLEMS  *BOWEL PROBLEMS  UNUSUAL RASH Items with * indicate a potential emergency and should be followed up as soon as possible.  Feel free to call the clinic you have any questions or concerns. The clinic phone number is [phone].  Please show the [facility] at check-in to the Emergency Department and triage nurse.

## 2016-09-05 NOTE — Progress Notes (Signed)
  [facility] OFFICE PROGRESS NOTE   Diagnosis: Gastroesophageal carcinoma  INTERVAL HISTORY:   [name] returns as scheduled. He completed another cycle of FOLFOX 08/22/2016. Oxaliplatin was held from the chemotherapy secondary to neuropathy symptoms. He tolerated the chemotherapy well. He continues to have numbness in the fingers. This does not interfere with activity. No dysphagia.  Objective:  Vital signs in last 24 hours:  Blood pressure 131/88, pulse 97, temperature 98.4 F (36.9 C), temperature source Oral, resp. rate 18, height 5\' 6"  (1.676 m), weight 168 lb 1.6 oz (76.2 kg), SpO2 99 %.    HEENT: No thrush or ulcers Lymphatics: No cervical or supraclavicular nodes Resp: Lungs clear bilaterally Cardio: Regular rate and rhythm GI: No hepatomegaly, nontender Vascular: No leg edema Neuro: Mild decrease in vibratory sense at the fingertips bilaterally  Skin: Palms without erythema   Portacath/PICC-without erythema  Lab Results:  Lab Results  Component Value Date   WBC 5.8 09/05/2016   HGB 12.8 (L) 09/05/2016   HCT 38.1 (L) 09/05/2016   MCV 90.2 09/05/2016   PLT 154 09/05/2016   NEUTROABS 3.5 09/05/2016     Medications: I have reviewed the patient's current medications.  Assessment/Plan: 1. Gastric cancer, gastric cardia mass standing to the GE junction confirmed on endoscopy 04/11/2016, biopsy confirmed adenocarcinoma ? Staging CTs of the chest, abdomen, and pelvis 04/09/2016-gastric cardia mass, mediastinal, right hilar, and right supraclavicular lymphadenopathy ? 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 Cycle 1 FOLFOX 04/30/2016 (oxaliplatin given 04/30/2016, 5-FU infusion started 05/02/2016) ? Cycle 2 FOLFOX 05/14/2016 ? Cycle 3 FOLFOX 05/30/2016 ? Cycle 4 FOLFOX 06/13/2016 ? Cycle 5 FOLFOX 06/27/2016 ? Cycle 6 FOLFOX 07/11/2016 ? CTs 07/20/2016-decrease in supraclavicular,  mediastinal, and gastrohepatic adenopathy. Decreased gastric cardia wall thickening. ? Cycle 7 FOLFOX 07/25/2016 ? Cycle 8 FOLFOX 08/08/2016 ? Cycle 9 FOLFOX 08/22/2016 (oxaliplatin held due to neuropathy) ? Cycle 10 FOLFOX 09/05/2016 (oxaliplatin held) 2. Anemia secondary to #1-improved  3. Diabetes  4. Gout  5. Hypertension  6. Hyperlipidemia  7. History of a T7 compression fracture  8. Pain secondary to the gastric mass and mediastinal lymphadenopathy-resolved  9. Port-A-Cath placement 04/20/2016  10. 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 Frieders appears stable. He will complete another cycle of chemotherapy today. He would like to continue holding the oxaliplatin. He will return for an office visit and the next cycle of chemotherapy on 09/27/2015.  Betsy Coder, MD  09/05/2016  10:37 AM

## 2016-09-06 NOTE — Telephone Encounter (Signed)
Coder was contacted and no further notes are needed

## 2016-09-07 ENCOUNTER — Ambulatory Visit (HOSPITAL_BASED_OUTPATIENT_CLINIC_OR_DEPARTMENT_OTHER): Payer: BLUE CROSS/BLUE SHIELD

## 2016-09-07 VITALS — BP 151/95 | HR 92 | Temp 98.5°F | Resp 18

## 2016-09-07 DIAGNOSIS — C160 Malignant neoplasm of cardia: Secondary | ICD-10-CM

## 2016-09-07 DIAGNOSIS — C770 Secondary and unspecified malignant neoplasm of lymph nodes of head, face and neck: Secondary | ICD-10-CM

## 2016-09-07 MED ORDER — HEPARIN SOD (PORK) LOCK FLUSH 100 UNIT/ML IV SOLN
500.0000 [IU] | Freq: Once | INTRAVENOUS | Status: AC | PRN
  Administered 2016-09-07: 500 [IU]
  Filled 2016-09-07: qty 5

## 2016-09-07 MED ORDER — SODIUM CHLORIDE 0.9% FLUSH
10.0000 mL | INTRAVENOUS | Status: DC | PRN
  Administered 2016-09-07: 10 mL
  Filled 2016-09-07: qty 10

## 2016-09-07 NOTE — Patient Instructions (Signed)

## 2016-09-13 ENCOUNTER — Emergency Department (HOSPITAL_COMMUNITY): Payer: BLUE CROSS/BLUE SHIELD

## 2016-09-13 ENCOUNTER — Emergency Department (HOSPITAL_COMMUNITY)
Admission: EM | Admit: 2016-09-13 | Discharge: 2016-09-13 | Disposition: A | Discharge status: Home / Self Care | Payer: BLUE CROSS/BLUE SHIELD | Attending: Emergency Medicine | Admitting: Emergency Medicine

## 2016-09-13 ENCOUNTER — Encounter (HOSPITAL_COMMUNITY): Payer: Self-pay | Admitting: Emergency Medicine

## 2016-09-13 DIAGNOSIS — Z794 Long term (current) use of insulin: Secondary | ICD-10-CM | POA: Insufficient documentation

## 2016-09-13 DIAGNOSIS — Z87891 Personal history of nicotine dependence: Secondary | ICD-10-CM | POA: Insufficient documentation

## 2016-09-13 DIAGNOSIS — M545 Low back pain: Secondary | ICD-10-CM | POA: Diagnosis present

## 2016-09-13 DIAGNOSIS — N23 Unspecified renal colic: Secondary | ICD-10-CM

## 2016-09-13 DIAGNOSIS — E119 Type 2 diabetes mellitus without complications: Secondary | ICD-10-CM | POA: Diagnosis not present

## 2016-09-13 DIAGNOSIS — I10 Essential (primary) hypertension: Secondary | ICD-10-CM | POA: Diagnosis not present

## 2016-09-13 DIAGNOSIS — N1330 Unspecified hydronephrosis: Secondary | ICD-10-CM | POA: Diagnosis not present

## 2016-09-13 DIAGNOSIS — Z85028 Personal history of other malignant neoplasm of stomach: Secondary | ICD-10-CM | POA: Diagnosis not present

## 2016-09-13 DIAGNOSIS — J449 Chronic obstructive pulmonary disease, unspecified: Secondary | ICD-10-CM | POA: Diagnosis not present

## 2016-09-13 DIAGNOSIS — R935 Abnormal findings on diagnostic imaging of other abdominal regions, including retroperitoneum: Secondary | ICD-10-CM | POA: Insufficient documentation

## 2016-09-13 LAB — COMPREHENSIVE METABOLIC PANEL
ALK PHOS: 86 U/L (ref 38–126)
ALT: 27 U/L (ref 17–63)
AST: 32 U/L (ref 15–41)
Albumin: 4.6 g/dL (ref 3.5–5.0)
Anion gap: 12 (ref 5–15)
BUN: 10 mg/dL (ref 6–20)
CHLORIDE: 105 mmol/L (ref 101–111)
CO2: 23 mmol/L (ref 22–32)
CREATININE: 0.85 mg/dL (ref 0.61–1.24)
Calcium: 9.5 mg/dL (ref 8.9–10.3)
GFR calc Af Amer: >60 mL/min (ref >60)
Glucose, Bld: 207 mg/dL — ABNORMAL HIGH (ref 65–99)
Potassium: 3.7 mmol/L (ref 3.5–5.1)
Sodium: 140 mmol/L (ref 135–145)
Total Bilirubin: 0.8 mg/dL (ref 0.3–1.2)
Total Protein: 7.4 g/dL (ref 6.5–8.1)

## 2016-09-13 LAB — CBC WITH DIFFERENTIAL/PLATELET
Basophils Absolute: 0 10*3/uL (ref 0.0–0.1)
Basophils Relative: 1 %
EOS PCT: 5 %
Eosinophils Absolute: 0.3 10*3/uL (ref 0.0–0.7)
HCT: 36.8 % — ABNORMAL LOW (ref 39.0–52.0)
HEMOGLOBIN: 13.2 g/dL (ref 13.0–17.0)
LYMPHS PCT: 20 %
Lymphs Abs: 1.1 10*3/uL (ref 0.7–4.0)
MCH: 31.9 pg (ref 26.0–34.0)
MCHC: 35.9 g/dL (ref 30.0–36.0)
MCV: 88.9 fL (ref 78.0–100.0)
Monocytes Absolute: 0.4 10*3/uL (ref 0.1–1.0)
Monocytes Relative: 7 %
NEUTROS ABS: 3.7 10*3/uL (ref 1.7–7.7)
NEUTROS PCT: 67 %
PLATELETS: 195 10*3/uL (ref 150–400)
RBC: 4.14 MIL/uL — AB (ref 4.22–5.81)
RDW: 15.9 % — ABNORMAL HIGH (ref 11.5–15.5)
WBC: 5.5 10*3/uL (ref 4.0–10.5)

## 2016-09-13 LAB — URINALYSIS, ROUTINE W REFLEX MICROSCOPIC
BACTERIA UA: NONE SEEN
Bilirubin Urine: NEGATIVE
Glucose, UA: 50 mg/dL — AB
Ketones, ur: 5 mg/dL — AB
Leukocytes, UA: NEGATIVE
Nitrite: NEGATIVE
PROTEIN: NEGATIVE mg/dL
SQUAMOUS EPITHELIAL / LPF: NONE SEEN
Specific Gravity, Urine: 1.015 (ref 1.005–1.030)
pH: 6 (ref 5.0–8.0)

## 2016-09-13 LAB — LIPASE, BLOOD: Lipase: 85 U/L — ABNORMAL HIGH (ref 11–51)

## 2016-09-13 MED ORDER — SODIUM CHLORIDE 0.9 % IV BOLUS (SEPSIS)
1000.0000 mL | Freq: Once | INTRAVENOUS | Status: AC
  Administered 2016-09-13: 1000 mL via INTRAVENOUS

## 2016-09-13 MED ORDER — HYDROMORPHONE HCL 2 MG/ML IJ SOLN
1.0000 mg | Freq: Once | INTRAMUSCULAR | Status: AC
  Administered 2016-09-13: 1 mg via INTRAVENOUS
  Filled 2016-09-13: qty 1

## 2016-09-13 MED ORDER — KETOROLAC TROMETHAMINE 30 MG/ML IJ SOLN
15.0000 mg | Freq: Once | INTRAMUSCULAR | Status: AC
  Administered 2016-09-13: 15 mg via INTRAVENOUS
  Filled 2016-09-13: qty 1

## 2016-09-13 MED ORDER — KETOROLAC TROMETHAMINE 15 MG/ML IJ SOLN
15.0000 mg | Freq: Once | INTRAMUSCULAR | Status: AC
  Administered 2016-09-13: 15 mg via INTRAVENOUS
  Filled 2016-09-13: qty 1

## 2016-09-13 MED ORDER — OXYCODONE-ACETAMINOPHEN 5-325 MG PO TABS
1.0000 | ORAL_TABLET | Freq: Once | ORAL | Status: AC
  Administered 2016-09-13: 1 via ORAL
  Filled 2016-09-13: qty 1

## 2016-09-13 MED ORDER — TAMSULOSIN HCL 0.4 MG PO CAPS: 0.4000 mg | ORAL_CAPSULE | Freq: Every day | ORAL | 0 refills | Status: DC

## 2016-09-13 MED ORDER — ONDANSETRON HCL 4 MG/2ML IJ SOLN
4.0000 mg | Freq: Once | INTRAMUSCULAR | Status: AC
  Administered 2016-09-13: 4 mg via INTRAVENOUS
  Filled 2016-09-13: qty 2

## 2016-09-13 NOTE — ED Triage Notes (Addendum)
Pt reports right flank pain, Hx kidney stones. denies hematuria nor dysuria. Appears in obvious distress. Also reports nausea and dry heaving

## 2016-09-13 NOTE — Discharge Instructions (Signed)
Please read and follow all provided instructions.  Your diagnoses today include:  1. Ureteral colic     Tests performed today include:  Urine test that showed blood in your urine and no infection  CT scan which showed a 1 millimeter kidney on the right side  Blood test that showed normal kidney function  Vital signs. See below for your results today.   Medications prescribed:   Flomax (tamsulosin) - relaxes smooth muscle to help kidney stones pass  Use home pain medications as needed.   If taking NSAIDs, discontinue with stomach pain or black or bloody stools.   Take any prescribed medications only as directed.  Home care instructions:  Follow any educational materials contained in this packet.  Follow-up instructions: Please follow-up with your urologist in the next 1 week for further evaluation of your symptoms.  If you need to return to the Emergency Department, go to [facility] and not [facility]. The urologists are located at [facility] and can better care for you at this location.  Return instructions:  If you need to return to the Emergency Department, go to [facility] and not [facility]. The urologists are located at [facility] and can better care for you at this location.   Please return to the Emergency Department if you experience worsening symptoms.  Please return if you develop fever or uncontrolled pain or vomiting.  Please return if you have any other emergent concerns.  Additional Information:  Your vital signs today were: BP 127/83 (BP Location: Left Arm)    Pulse 63    Temp 97.9 F (36.6 C) (Oral)    Resp 18    SpO2 97%  If your blood pressure (BP) was elevated above 135/85 this visit, please have this repeated by your doctor within one month. --------------

## 2016-09-13 NOTE — ED Provider Notes (Signed)
[facility] DEPT Provider Note   CSN: [id number] Arrival date & time: 09/13/16  0940     History   Chief Complaint Chief Complaint  Patient presents with  . Flank Pain    right    HPI Robert Beck is a 55 y.o. male.  Patient with history of gastric cancer, undergoing chemotherapy, last treatment 8 days ago, history of kidney stones last stone 10 years ago -- presents with acute onset of right lower back and flank pain radiates to his abdomen starting this morning. This has been associated with vomiting. Patient has been severe. No treatments prior to arrival other than 2 Vicodin which helped for about 20 minutes. No dysuria, increased frequency, increased urgency, or hematuria. The onset of this condition was acute. The course is constant. Aggravating factors: none. Alleviating factors: Vicodin.        Past Medical History:  Diagnosis Date  . Anemia   . Asthma   . COPD (chronic obstructive pulmonary disease) ([place])   . Depression   . Gout   . Hyperlipidemia   . Hypertension   . Iron deficiency anemia   . Ischemia   . Osteoarthritis   . RLS (restless legs syndrome)   . Type 2 diabetes, controlled, with neuropathy ([place])   . Vitamin B12 deficiency     Patient Active Problem List   Diagnosis Date Noted  . Port catheter in place 08/08/2016  . Adenocarcinoma of gastric cardia ([place]) 04/17/2016  . Special screening for malignant neoplasms, colon 03/02/2016  . Anemia, iron deficiency 03/02/2016  . Insulin dependent diabetes mellitus ([place]) 03/02/2016  . Heme positive stool 03/02/2016  . Loss of weight 03/02/2016  . ESSENTIAL HYPERTENSION, BENIGN 03/03/2010  . SINUS TACHYCARDIA 03/03/2010  . SHORTNESS OF BREATH 03/03/2010    Past Surgical History:  Procedure Laterality Date  . ANKLE FRACTURE SURGERY    . CIRCUMCISION  2008  . COLONOSCOPY    . IR GENERIC HISTORICAL  04/20/2016   IR FLUORO GUIDE CV LINE RIGHT 04/20/2016 [facility]INTER[facility]  . IR GENERIC HISTORICAL   04/20/2016   IR US GUIDE BX ASP/DRAIN 04/20/2016 [facility]  . IR GENERIC HISTORICAL  04/20/2016   IR US GUIDE VASC ACCESS RIGHT 04/20/2016 [facility]  . KNEE SURGERY    . NOSE SURGERY    . TONGUE SURGERY  2007   small growth removed from underneath his tongue, not cancerous.   [facility] Kitchen UPPER GASTROINTESTINAL ENDOSCOPY         Home Medications    Prior to Admission medications   Medication Sig Start Date End Date Taking? Authorizing Provider  allopurinol (ZYLOPRIM) 300 MG tablet Take 300 mg by mouth daily. He is taking every other day.    Historical Provider, MD  doxycycline (VIBRAMYCIN) 100 MG capsule Take 100 mg by mouth daily.     Historical Provider, MD  Ferrous Sulfate (SLOW RELEASE IRON) 47.5 MG TBCR Take 1 tablet by mouth 3 (three) times daily.     Historical Provider, MD  insulin NPH-regular Human (HUMULIN 70/30) (70-30) 100 UNIT/ML injection Inject 40 Units into the skin daily.     Historical Provider, MD  levalbuterol [facility] HFA) 45 MCG/ACT inhaler Inhale 2 puffs into the lungs 3 (three) times daily as needed for wheezing.    Historical Provider, MD  lidocaine-prilocaine (EMLA) cream Apply 1 application topically as needed. Apply 1 hr prior to port access and cover with plastic wrap 04/20/16   Ladell Pier, MD  olmesartan-hydrochlorothiazide [facility] HCT) [phone]  MG tablet Take 1 tablet by mouth daily. He is only taking a 1/2 tablet daily.    Historical Provider, MD  ondansetron (ZOFRAN) 8 MG tablet Take 1 tablet (8 mg total) by mouth every 8 (eight) hours as needed for nausea or vomiting. 05/30/16   Ladell Pier, MD  oxyCODONE-acetaminophen (PERCOCET/ROXICET) 5-325 MG tablet Take 1-2 tablets by mouth every 4 (four) hours as needed for severe pain. 04/17/16   Ladell Pier, MD  potassium chloride SA (K-DUR,KLOR-CON) 20 MEQ tablet Take 1 tablet (20 mEq total) by mouth daily. 04/20/16   Ladell Pier, MD  prochlorperazine (COMPAZINE) 10 MG tablet Take 1 tablet (10 mg total) by  mouth every 6 (six) hours as needed for nausea or vomiting. 04/25/16   Ladell Pier, MD  rOPINIRole (REQUIP) 1 MG tablet Take 1.5 mg by mouth at bedtime.     Historical Provider, MD    Family History Family History  Problem Relation Age of Onset  . Leukemia Mother   . Breast cancer Mother   . Heart disease Father   . Colon cancer Neg Hx   . Colon polyps Neg Hx   . Esophageal cancer Neg Hx   . Rectal cancer Neg Hx   . Stomach cancer Neg Hx     Social History Social History  Substance Use Topics  . Smoking status: Former Smoker    Quit date: 09/24/2002  . Smokeless tobacco: Never Used  . Alcohol use 0.0 oz/week     Comment: 2 drinks per week     Allergies   Patient has no known allergies.   Review of Systems Review of Systems  Constitutional: Negative for fever.  HENT: Negative for rhinorrhea and sore throat.   Eyes: Negative for redness.  Respiratory: Negative for cough.   Cardiovascular: Negative for chest pain.  Gastrointestinal: Positive for abdominal pain, nausea and vomiting. Negative for diarrhea.  Genitourinary: Positive for flank pain. Negative for dysuria.  Musculoskeletal: Positive for back pain. Negative for myalgias.  Skin: Negative for rash.  Neurological: Negative for headaches.     Physical Exam Updated Vital Signs BP 174/98   Pulse 69   Temp 97.9 F (36.6 C) (Oral)   Resp 18   SpO2 97%   Physical Exam  Constitutional: He appears well-developed and well-nourished. He appears distressed.  HENT:  Head: Normocephalic and atraumatic.  Mouth/Throat: Oropharynx is clear and moist.  Eyes: Conjunctivae are normal. Right eye exhibits no discharge. Left eye exhibits no discharge.  Neck: Normal range of motion. Neck supple.  Cardiovascular: Normal rate, regular rhythm and normal heart sounds.   Pulmonary/Chest: Effort normal and breath sounds normal. No respiratory distress. He has no wheezes. He has no rales.  Abdominal: Soft. He exhibits no mass.  There is tenderness. There is no guarding.  Mild, right-sided  Neurological: He is alert.  Skin: Skin is warm and dry.  Psychiatric: He has a normal mood and affect.  Nursing note and vitals reviewed.    ED Treatments / Results  Labs (all labs ordered are listed, but only abnormal results are displayed) Labs Reviewed  URINALYSIS, ROUTINE W REFLEX MICROSCOPIC - Abnormal; Notable for the following:       Result Value   Glucose, UA 50 (*)    Hgb urine dipstick LARGE (*)    Ketones, ur 5 (*)    All other components within normal limits  CBC WITH DIFFERENTIAL/PLATELET - Abnormal; Notable for the following:    RBC 4.14 (*)  HCT 36.8 (*)    RDW 15.9 (*)    All other components within normal limits  COMPREHENSIVE METABOLIC PANEL - Abnormal; Notable for the following:    Glucose, Bld 207 (*)    All other components within normal limits  LIPASE, BLOOD - Abnormal; Notable for the following:    Lipase 85 (*)    All other components within normal limits    Radiology Ct Renal Stone Study  Result Date: 09/13/2016 CLINICAL DATA:  55 year old diabetic hypertensive male with right flank pain. History kidney stones. Gastric adenocarcinoma diagnosed May 2017 with ongoing chemotherapy. Initial encounter. EXAM: CT ABDOMEN AND PELVIS WITHOUT CONTRAST TECHNIQUE: Multidetector CT imaging of the abdomen and pelvis was performed following the standard protocol without IV contrast. COMPARISON:  None. FINDINGS: Lower chest: Minimal scarring inferior lingula. Hepatobiliary: Taking into account limitation by non contrast imaging, 3 mm low-density structure left lobe liver unchanged. No calcified lymph nodes. Pancreas: Taking into account limitation by non contrast imaging, no pancreatic mass or inflammation. Spleen: Coarse calcifications inferior spleen. Slightly elongated although normal size spleen. Adrenals/Urinary Tract: Moderate right hydroureteronephrosis secondary to 1 mm obstructing stone distal right  ureter/right ureteral vesicle junction best visualized on sagittal and coronal imaging. Left renal 3.8 cm cyst.  No adrenal mass. Stomach/Bowel: Thickening gastroesophageal junction consistent with patient's known malignancy with adjacent gastrohepatic adenopathy similar to the 07/20/2016 exam. Scattered colonic diverticula without inflammation. Vascular/Lymphatic: Mild calcified plaque abdominal aorta without aneurysmal dilation. Adenopathy as noted above. Reproductive: Coarse calcifications within the prostate gland. Other: No free intraperitoneal air or abnormal fluid collection. Musculoskeletal: No osseous destructive lesion. IMPRESSION: Moderate right hydroureteronephrosis secondary to 1 mm obstructing stone distal right ureter/right ureteral vesicle junction best visualized on sagittal and coronal imaging. Thickening gastroesophageal junction consistent with patient's known malignancy with adjacent gastrohepatic adenopathy similar to the 07/20/2016 exam. Scattered colonic diverticula without inflammation. Electronically Signed   By: Genia Del M.D.   On: 09/13/2016 10:49    Procedures Procedures (including critical care time)  Medications Ordered in ED Medications  HYDROmorphone (DILAUDID) injection 1 mg (1 mg Intravenous Given 09/13/16 1011)  ondansetron (ZOFRAN) injection 4 mg (4 mg Intravenous Given 09/13/16 1011)  ketorolac (TORADOL) 30 MG/ML injection 15 mg (15 mg Intravenous Given 09/13/16 1111)  sodium chloride 0.9 % bolus 1,000 mL (0 mLs Intravenous Stopped 09/13/16 1315)  ketorolac (TORADOL) 15 MG/ML injection 15 mg (15 mg Intravenous Given 09/13/16 1441)  oxyCODONE-acetaminophen (PERCOCET/ROXICET) 5-325 MG per tablet 1 tablet (1 tablet Oral Given 09/13/16 1441)     Initial Impression / Assessment and Plan / ED Course  I have reviewed the triage vital signs and the nursing notes.  Pertinent labs & imaging results that were available during my care of the patient were reviewed by  me and considered in my medical decision making (see chart for details).  Clinical Course    Patient seen and examined. Work-up initiated. Medications ordered. Pt actively vomiting. CT renal protocol ordered.   Vital signs reviewed and are as follows: BP 174/98   Pulse 69   Temp 97.9 F (36.6 C) (Oral)   Resp 18   SpO2 97%   CT confirms stone. Dilaudid helped temporarily -- but pain is severe again. H/o GI bleed with gastric cancer. Will give 15mg  IV toradol.   Patient with significant improvement in pain. Did have gradual return with movement. Additional 15mg  toradol given and 1 PO Percocet.   Patient counseled on kidney stone treatment. Urged patient to strain urine and  save any stones. Urged urology follow-up and return to [facility] with any complications. Counseled patient to maintain good fluid intake.   Counseled patient on use of Flomax. Discussed careful use of NSAIDs if not improved with home pain medications. Return with bloody or black stool.   Patient counseled on use of narcotic pain medications. Counseled not to combine these medications with others containing tylenol. Urged not to drink alcohol, drive, or perform any other activities that requires focus while taking these medications. The patient verbalizes understanding and agrees with the plan.   Final Clinical Impressions(s) / ED Diagnoses   Final diagnoses:  Ureteral colic   Patient with right-sided ureteral colic, no urinary tract infection. Pain controlled in emergency department. He is stable with normal hemoglobin. Encouraged urology follow-up as needed. Return instructions as above. Lipase slightly elevated -- history and exam not consistent with pancreatitis and CT does not demonstrate pancreatitis.    New Prescriptions Discharge Medication List as of 09/13/2016  2:55 PM    START taking these medications   Details  tamsulosin (FLOMAX) 0.4 MG CAPS capsule Take 1 capsule (0.4 mg total) by mouth daily., Starting  Thu 09/13/2016, Print         Carlisle Cater, PA-C 09/13/16 [place], MD 09/13/16 [id number]

## 2016-09-24 ENCOUNTER — Other Ambulatory Visit: Payer: Self-pay | Admitting: Oncology

## 2016-09-26 ENCOUNTER — Ambulatory Visit (HOSPITAL_BASED_OUTPATIENT_CLINIC_OR_DEPARTMENT_OTHER): Payer: BLUE CROSS/BLUE SHIELD | Admitting: Nurse Practitioner

## 2016-09-26 ENCOUNTER — Other Ambulatory Visit (HOSPITAL_BASED_OUTPATIENT_CLINIC_OR_DEPARTMENT_OTHER): Payer: BLUE CROSS/BLUE SHIELD

## 2016-09-26 ENCOUNTER — Telehealth: Payer: Self-pay | Admitting: Nurse Practitioner

## 2016-09-26 ENCOUNTER — Ambulatory Visit (HOSPITAL_BASED_OUTPATIENT_CLINIC_OR_DEPARTMENT_OTHER): Payer: BLUE CROSS/BLUE SHIELD

## 2016-09-26 ENCOUNTER — Ambulatory Visit: Payer: BLUE CROSS/BLUE SHIELD

## 2016-09-26 ENCOUNTER — Encounter: Payer: BLUE CROSS/BLUE SHIELD | Admitting: Nutrition

## 2016-09-26 VITALS — BP 148/100 | HR 88 | Temp 98.4°F | Resp 20 | Ht 66.0 in | Wt 170.2 lb

## 2016-09-26 VITALS — BP 157/95 | HR 79

## 2016-09-26 DIAGNOSIS — G620 Drug-induced polyneuropathy: Secondary | ICD-10-CM

## 2016-09-26 DIAGNOSIS — E119 Type 2 diabetes mellitus without complications: Secondary | ICD-10-CM

## 2016-09-26 DIAGNOSIS — Z95828 Presence of other vascular implants and grafts: Secondary | ICD-10-CM

## 2016-09-26 DIAGNOSIS — C160 Malignant neoplasm of cardia: Secondary | ICD-10-CM | POA: Diagnosis not present

## 2016-09-26 DIAGNOSIS — C770 Secondary and unspecified malignant neoplasm of lymph nodes of head, face and neck: Secondary | ICD-10-CM

## 2016-09-26 DIAGNOSIS — Z5111 Encounter for antineoplastic chemotherapy: Secondary | ICD-10-CM | POA: Diagnosis not present

## 2016-09-26 DIAGNOSIS — I10 Essential (primary) hypertension: Secondary | ICD-10-CM

## 2016-09-26 DIAGNOSIS — M109 Gout, unspecified: Secondary | ICD-10-CM

## 2016-09-26 DIAGNOSIS — N201 Calculus of ureter: Secondary | ICD-10-CM

## 2016-09-26 LAB — COMPREHENSIVE METABOLIC PANEL
ALT: 20 U/L (ref 0–55)
AST: 25 U/L (ref 5–34)
Albumin: 3.8 g/dL (ref 3.5–5.0)
Alkaline Phosphatase: 97 U/L (ref 40–150)
Anion Gap: 12 mEq/L — ABNORMAL HIGH (ref 3–11)
BUN: 5.4 mg/dL — AB (ref 7.0–26.0)
CALCIUM: 9.5 mg/dL (ref 8.4–10.4)
CO2: 22 meq/L (ref 22–29)
CREATININE: 0.8 mg/dL (ref 0.7–1.3)
Chloride: 110 mEq/L — ABNORMAL HIGH (ref 98–109)
EGFR: >90 mL/min/{1.73_m2} (ref >90)
Glucose: 129 mg/dl (ref 70–140)
Potassium: 3.7 mEq/L (ref 3.5–5.1)
Sodium: 144 mEq/L (ref 136–145)
TOTAL PROTEIN: 6.9 g/dL (ref 6.4–8.3)
Total Bilirubin: 0.45 mg/dL (ref 0.20–1.20)

## 2016-09-26 LAB — CBC WITH DIFFERENTIAL/PLATELET
BASO%: 0.6 % (ref 0.0–2.0)
Basophils Absolute: 0 10*3/uL (ref 0.0–0.1)
EOS%: 3.4 % (ref 0.0–7.0)
Eosinophils Absolute: 0.2 10*3/uL (ref 0.0–0.5)
HEMATOCRIT: 38.1 % — AB (ref 38.4–49.9)
HGB: 13.2 g/dL (ref 13.0–17.1)
LYMPH#: 0.9 10*3/uL (ref 0.9–3.3)
LYMPH%: 13.7 % — ABNORMAL LOW (ref 14.0–49.0)
MCH: 31.7 pg (ref 27.2–33.4)
MCHC: 34.6 g/dL (ref 32.0–36.0)
MCV: 91.6 fL (ref 79.3–98.0)
MONO#: 0.8 10*3/uL (ref 0.1–0.9)
MONO%: 11.5 % (ref 0.0–14.0)
NEUT%: 70.8 % (ref 39.0–75.0)
NEUTROS ABS: 4.6 10*3/uL (ref 1.5–6.5)
Platelets: 180 10*3/uL (ref 140–400)
RBC: 4.16 10*6/uL — ABNORMAL LOW (ref 4.20–5.82)
RDW: 14.6 % (ref 11.0–14.6)
WBC: 6.5 10*3/uL (ref 4.0–10.3)

## 2016-09-26 MED ORDER — SODIUM CHLORIDE 0.9 % IV SOLN
2400.0000 mg/m2 | INTRAVENOUS | Status: DC
  Administered 2016-09-26: 4300 mg via INTRAVENOUS
  Filled 2016-09-26: qty 86

## 2016-09-26 MED ORDER — FLUOROURACIL CHEMO INJECTION 2.5 GM/50ML
400.0000 mg/m2 | Freq: Once | INTRAVENOUS | Status: AC
  Administered 2016-09-26: 700 mg via INTRAVENOUS
  Filled 2016-09-26: qty 14

## 2016-09-26 MED ORDER — SODIUM CHLORIDE 0.9 % IV SOLN
Freq: Once | INTRAVENOUS | Status: AC
  Administered 2016-09-26: 11:00:00 via INTRAVENOUS

## 2016-09-26 MED ORDER — LEUCOVORIN CALCIUM INJECTION 350 MG
400.0000 mg/m2 | Freq: Once | INTRAVENOUS | Status: AC
  Administered 2016-09-26: 720 mg via INTRAVENOUS
  Filled 2016-09-26: qty 36

## 2016-09-26 MED ORDER — SODIUM CHLORIDE 0.9% FLUSH
10.0000 mL | INTRAVENOUS | Status: DC | PRN
  Administered 2016-09-26: 10 mL via INTRAVENOUS
  Filled 2016-09-26: qty 10

## 2016-09-26 NOTE — Telephone Encounter (Signed)
Appointments scheduled per 1/3 LOS. Patient given AVS report and calendars with future scheduled appointments. °

## 2016-09-26 NOTE — Progress Notes (Signed)
Ned Card NP aware of B/P okay to proceed with treatment, no new orders at this time.

## 2016-09-26 NOTE — Patient Instructions (Signed)
[facility] Discharge Instructions for Patients Receiving Chemotherapy  Today you received the following chemotherapy agents: Leucovorin, Adrucil   To help prevent nausea and vomiting after your treatment, we encourage you to take your nausea medication as directed.   If you develop nausea and vomiting that is not controlled by your nausea medication, call the clinic.   BELOW ARE SYMPTOMS THAT SHOULD BE REPORTED IMMEDIATELY:  *FEVER GREATER THAN 100.5 F  *CHILLS WITH OR WITHOUT FEVER  NAUSEA AND VOMITING THAT IS NOT CONTROLLED WITH YOUR NAUSEA MEDICATION  *UNUSUAL SHORTNESS OF BREATH  *UNUSUAL BRUISING OR BLEEDING  TENDERNESS IN MOUTH AND THROAT WITH OR WITHOUT PRESENCE OF ULCERS  *URINARY PROBLEMS  *BOWEL PROBLEMS  UNUSUAL RASH Items with * indicate a potential emergency and should be followed up as soon as possible.  Feel free to call the clinic you have any questions or concerns. The clinic phone number is [phone].  Please show the [facility] at check-in to the Emergency Department and triage nurse.

## 2016-09-26 NOTE — Patient Instructions (Signed)

## 2016-09-26 NOTE — Progress Notes (Signed)
  [facility] OFFICE PROGRESS NOTE   Diagnosis:  Gastroesophageal carcinoma  INTERVAL HISTORY:   Mr. Mednick returns as scheduled. He completed cycle 10 FOLFOX 09/05/2016. Oxaliplatin was held due to neuropathy. He had mild nausea. No mouth sores. No significant diarrhea. He notes increased numbness in the fingertips and toes. He has dropped a few objects. No dysphagia. He has occasional upper abdominal pain.  He was seen in the emergency department 09/13/2016 for evaluation of right flank pain. CT renal stone study showed moderate right hydroureteronephrosis secondary to a 1 mm obstructing stone distal right ureter/right ureteral vesicle junction. The pain has improved. He is not sure if the stone has past.  Objective:  Vital signs in last 24 hours:  Blood pressure (!) 148/100, pulse 88, temperature 98.4 F (36.9 C), temperature source Oral, resp. rate 20, height 5\' 6"  (1.676 m), weight 170 lb 3.2 oz (77.2 kg), SpO2 99 %.    HEENT: No thrush or ulcers. Lymphatics: No palpable cervical or supra-clavicular lymph nodes. Resp: Lungs clear bilaterally. Cardio: Regular rate and rhythm. GI: Abdomen soft and nontender. No hepatomegaly. No mass. Vascular: No leg edema. Neuro: Vibratory sense mildly decreased to intact over the fingertips per tuning fork exam.  Port-A-Cath without erythema.   Lab Results:  Lab Results  Component Value Date   WBC 6.5 09/26/2016   HGB 13.2 09/26/2016   HCT 38.1 (L) 09/26/2016   MCV 91.6 09/26/2016   PLT 180 09/26/2016   NEUTROABS 4.6 09/26/2016    Imaging:  No results found.  Medications: I have reviewed the patient's current medications.  Assessment/Plan: 1. Gastric cancer, gastric cardia mass standing to the GE junction confirmed on endoscopy 04/11/2016, biopsy confirmed adenocarcinoma ? Staging CTs of the chest, abdomen, and pelvis 04/09/2016-gastric cardia mass, mediastinal, right hilar, and right supraclavicular  lymphadenopathy ? 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 Cycle 1 FOLFOX 04/30/2016 (oxaliplatin given 04/30/2016, 5-FU infusion started 05/02/2016) ? Cycle 2 FOLFOX 05/14/2016 ? Cycle 3 FOLFOX 05/30/2016 ? Cycle 4 FOLFOX 06/13/2016 ? Cycle 5 FOLFOX 06/27/2016 ? Cycle 6 FOLFOX 07/11/2016 ? CTs 07/20/2016-decrease in supraclavicular, mediastinal, and gastrohepatic adenopathy. Decreased gastric cardia wall thickening. ? Cycle 7 FOLFOX 07/25/2016 ? Cycle 8 FOLFOX 08/08/2016 ? Cycle 9 FOLFOX 08/22/2016 (oxaliplatin held due to neuropathy) ? Cycle 10 FOLFOX 09/05/2016 (oxaliplatin held) ? Cycle 11 FOLFOX 09/26/2016 (oxaliplatin held) 2. Anemia secondary to #1-improved  3. Diabetes  4. Gout  5. Hypertension  6. Hyperlipidemia  7. History of a T7 compression fracture  8. Pain secondary to the gastric mass and mediastinal lymphadenopathy-resolved  9. Port-A-Cath placement 04/20/2016  10. 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 1 mm obstructing stone distal right ureter/right ureteral vesicle junction on CT 09/13/2016.   Disposition: Mr. Albares appears stable. He has completed 10 cycles of FOLFOX. Oxaliplatin was held with cycles 9 and 10 due to neuropathy. Plan to proceed with cycle 11 today as scheduled. We will continue to hold the oxaliplatin.  He will contact his urologist regarding the ureter stone.  He will return for a follow-up visit and cycle 12 FOLFOX in 2 weeks. The plan is to obtain restaging CT scans after he has completed 12 cycles.  Plan reviewed with Dr. Benay Spice.    Ned Card ANP/GNP-BC   09/26/2016  10:19 AM

## 2016-09-28 ENCOUNTER — Ambulatory Visit (HOSPITAL_BASED_OUTPATIENT_CLINIC_OR_DEPARTMENT_OTHER): Payer: BLUE CROSS/BLUE SHIELD

## 2016-09-28 VITALS — BP 149/92 | HR 86 | Temp 99.3°F | Resp 18

## 2016-09-28 DIAGNOSIS — C160 Malignant neoplasm of cardia: Secondary | ICD-10-CM | POA: Diagnosis not present

## 2016-09-28 DIAGNOSIS — C770 Secondary and unspecified malignant neoplasm of lymph nodes of head, face and neck: Secondary | ICD-10-CM

## 2016-09-28 MED ORDER — SODIUM CHLORIDE 0.9% FLUSH
10.0000 mL | INTRAVENOUS | Status: DC | PRN
  Administered 2016-09-28: 10 mL
  Filled 2016-09-28: qty 10

## 2016-09-28 MED ORDER — HEPARIN SOD (PORK) LOCK FLUSH 100 UNIT/ML IV SOLN
500.0000 [IU] | Freq: Once | INTRAVENOUS | Status: AC | PRN
  Administered 2016-09-28: 500 [IU]
  Filled 2016-09-28: qty 5

## 2016-09-28 NOTE — Patient Instructions (Signed)

## 2016-10-07 ENCOUNTER — Other Ambulatory Visit: Payer: Self-pay | Admitting: Oncology

## 2016-10-10 ENCOUNTER — Other Ambulatory Visit (HOSPITAL_BASED_OUTPATIENT_CLINIC_OR_DEPARTMENT_OTHER): Payer: BLUE CROSS/BLUE SHIELD

## 2016-10-10 ENCOUNTER — Ambulatory Visit (HOSPITAL_BASED_OUTPATIENT_CLINIC_OR_DEPARTMENT_OTHER): Payer: BLUE CROSS/BLUE SHIELD

## 2016-10-10 ENCOUNTER — Ambulatory Visit (HOSPITAL_BASED_OUTPATIENT_CLINIC_OR_DEPARTMENT_OTHER): Payer: BLUE CROSS/BLUE SHIELD | Admitting: Nurse Practitioner

## 2016-10-10 ENCOUNTER — Ambulatory Visit: Payer: BLUE CROSS/BLUE SHIELD

## 2016-10-10 VITALS — BP 159/93 | HR 101 | Temp 98.2°F | Resp 18 | Wt 170.0 lb

## 2016-10-10 VITALS — HR 98

## 2016-10-10 DIAGNOSIS — C770 Secondary and unspecified malignant neoplasm of lymph nodes of head, face and neck: Secondary | ICD-10-CM | POA: Diagnosis not present

## 2016-10-10 DIAGNOSIS — I10 Essential (primary) hypertension: Secondary | ICD-10-CM

## 2016-10-10 DIAGNOSIS — C160 Malignant neoplasm of cardia: Secondary | ICD-10-CM

## 2016-10-10 DIAGNOSIS — E876 Hypokalemia: Secondary | ICD-10-CM | POA: Diagnosis not present

## 2016-10-10 DIAGNOSIS — Z5111 Encounter for antineoplastic chemotherapy: Secondary | ICD-10-CM | POA: Diagnosis not present

## 2016-10-10 DIAGNOSIS — G620 Drug-induced polyneuropathy: Secondary | ICD-10-CM

## 2016-10-10 DIAGNOSIS — E785 Hyperlipidemia, unspecified: Secondary | ICD-10-CM

## 2016-10-10 DIAGNOSIS — E119 Type 2 diabetes mellitus without complications: Secondary | ICD-10-CM

## 2016-10-10 DIAGNOSIS — Z95828 Presence of other vascular implants and grafts: Secondary | ICD-10-CM

## 2016-10-10 LAB — CBC WITH DIFFERENTIAL/PLATELET
BASO%: 0.5 % (ref 0.0–2.0)
Basophils Absolute: 0 10*3/uL (ref 0.0–0.1)
EOS%: 9.9 % — AB (ref 0.0–7.0)
Eosinophils Absolute: 0.9 10*3/uL — ABNORMAL HIGH (ref 0.0–0.5)
HEMATOCRIT: 37.8 % — AB (ref 38.4–49.9)
HEMOGLOBIN: 13.4 g/dL (ref 13.0–17.1)
LYMPH#: 1.5 10*3/uL (ref 0.9–3.3)
LYMPH%: 17.2 % (ref 14.0–49.0)
MCH: 31.8 pg (ref 27.2–33.4)
MCHC: 35.4 g/dL (ref 32.0–36.0)
MCV: 89.6 fL (ref 79.3–98.0)
MONO#: 0.8 10*3/uL (ref 0.1–0.9)
MONO%: 8.6 % (ref 0.0–14.0)
NEUT%: 63.8 % (ref 39.0–75.0)
NEUTROS ABS: 5.6 10*3/uL (ref 1.5–6.5)
PLATELETS: 185 10*3/uL (ref 140–400)
RBC: 4.22 10*6/uL (ref 4.20–5.82)
RDW: 13 % (ref 11.0–14.6)
WBC: 8.7 10*3/uL (ref 4.0–10.3)

## 2016-10-10 LAB — COMPREHENSIVE METABOLIC PANEL
ALBUMIN: 3.8 g/dL (ref 3.5–5.0)
ALT: 32 U/L (ref 0–55)
AST: 35 U/L — AB (ref 5–34)
Alkaline Phosphatase: 116 U/L (ref 40–150)
Anion Gap: 13 mEq/L — ABNORMAL HIGH (ref 3–11)
BILIRUBIN TOTAL: 0.37 mg/dL (ref 0.20–1.20)
BUN: 7.1 mg/dL (ref 7.0–26.0)
CALCIUM: 9.6 mg/dL (ref 8.4–10.4)
CO2: 21 mEq/L — ABNORMAL LOW (ref 22–29)
CREATININE: 0.8 mg/dL (ref 0.7–1.3)
Chloride: 110 mEq/L — ABNORMAL HIGH (ref 98–109)
EGFR: >90 mL/min/{1.73_m2} (ref >90)
Glucose: 127 mg/dl (ref 70–140)
Potassium: 3.3 mEq/L — ABNORMAL LOW (ref 3.5–5.1)
Sodium: 144 mEq/L (ref 136–145)
TOTAL PROTEIN: 7.4 g/dL (ref 6.4–8.3)

## 2016-10-10 MED ORDER — SODIUM CHLORIDE 0.9 % IV SOLN
2400.0000 mg/m2 | INTRAVENOUS | Status: DC
  Administered 2016-10-10: 4300 mg via INTRAVENOUS
  Filled 2016-10-10: qty 86

## 2016-10-10 MED ORDER — SODIUM CHLORIDE 0.9% FLUSH
10.0000 mL | INTRAVENOUS | Status: DC | PRN
  Administered 2016-10-10: 10 mL via INTRAVENOUS
  Filled 2016-10-10: qty 10

## 2016-10-10 MED ORDER — LEUCOVORIN CALCIUM INJECTION 350 MG
400.0000 mg/m2 | Freq: Once | INTRAMUSCULAR | Status: AC
  Administered 2016-10-10: 720 mg via INTRAVENOUS
  Filled 2016-10-10: qty 36

## 2016-10-10 MED ORDER — POTASSIUM CHLORIDE CRYS ER 20 MEQ PO TBCR: 20.0000 meq | EXTENDED_RELEASE_TABLET | Freq: Every day | ORAL | 1 refills | Status: DC

## 2016-10-10 MED ORDER — FLUOROURACIL CHEMO INJECTION 2.5 GM/50ML
400.0000 mg/m2 | Freq: Once | INTRAVENOUS | Status: AC
  Administered 2016-10-10: 700 mg via INTRAVENOUS
  Filled 2016-10-10: qty 14

## 2016-10-10 NOTE — Progress Notes (Signed)
[facility] OFFICE PROGRESS NOTE   Diagnosis:  Gastroesophageal carcinoma  INTERVAL HISTORY:   Robert Beck returns as scheduled. He completed cycle 11 FOLFOX 09/26/2016. Oxaliplatin was held due to neuropathy. The neuropathy symptoms in the fingertips has worsened. He is dropping things. He notes less numbness involving the tongue. He developed a sore throat and congestion about a week ago. Symptoms are better. He denies significant nausea/vomiting. No mouth sores. No diarrhea.  He reports seeing urology regarding the kidney stone. He had a negative ultrasound. The pain has resolved.  Objective:  Vital signs in last 24 hours:  Blood pressure (!) 159/93, pulse (!) 101, temperature 98.2 F (36.8 C), temperature source Oral, resp. rate 18, weight 170 lb (77.1 kg), SpO2 100 %.    HEENT: No thrush or ulcers. Lymphatics: No palpable cervical or supra clavicular lymph nodes. Resp: Lungs clear bilaterally. Cardio: Regular rate and rhythm. GI: Abdomen soft and nontender. No hepatomegaly. No mass. Vascular: No leg edema. Neuro: Vibratory sense moderately decreased over the fingertips per tuning fork exam.  Skin: No rash. Port-A-Cath without erythema.    Lab Results:  Lab Results  Component Value Date   WBC 8.7 10/10/2016   HGB 13.4 10/10/2016   HCT 37.8 (L) 10/10/2016   MCV 89.6 10/10/2016   PLT 185 10/10/2016   NEUTROABS 5.6 10/10/2016    Imaging:  No results found.  Medications: I have reviewed the patient's current medications.  Assessment/Plan: 1. Gastric cancer, gastric cardia mass standing to the GE junction confirmed on endoscopy 04/11/2016, biopsy confirmed adenocarcinoma ? Staging CTs of the chest, abdomen, and pelvis 04/09/2016-gastric cardia mass, mediastinal, right hilar, and right supraclavicular lymphadenopathy ? 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 Cycle 1 FOLFOX 04/30/2016  (oxaliplatin given 04/30/2016, 5-FU infusion started 05/02/2016) ? Cycle 2 FOLFOX 05/14/2016 ? Cycle 3 FOLFOX 05/30/2016 ? Cycle 4 FOLFOX 06/13/2016 ? Cycle 5 FOLFOX 06/27/2016 ? Cycle 6 FOLFOX 07/11/2016 ? CTs 07/20/2016-decrease in supraclavicular, mediastinal, and gastrohepatic adenopathy. Decreased gastric cardia wall thickening. ? Cycle 7 FOLFOX 07/25/2016 ? Cycle 8 FOLFOX 08/08/2016 ? Cycle 9 FOLFOX 08/22/2016 (oxaliplatin held due to neuropathy) ? Cycle 10 FOLFOX 09/05/2016 (oxaliplatin held) ? Cycle 11 FOLFOX 09/26/2016 (oxaliplatin held) ? Cycle 12 FOLFOX 10/10/2016 (oxaliplatin held) 2. Anemia secondary to #1-improved  3. Diabetes  4. Gout  5. Hypertension  6. Hyperlipidemia  7. History of a T7 compression fracture  8. Pain secondary to the gastric mass and mediastinal lymphadenopathy-resolved  9. Port-A-Cath placement 04/20/2016  10. 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 1 mm obstructing stone distal right ureter/right ureteral vesicle junction on CT 09/13/2016.   Disposition: Robert Beck appears stable. He has completed 11 cycles of FOLFOX. Oxaliplatin has been on hold since cycle 9 due to neuropathy. He has persistent/increased neuropathy symptoms. Plan to proceed with cycle 12 FOLFOX today as scheduled. We will continue to hold the oxaliplatin.  He will have restaging CT scans prior to his next visit in 2 weeks.  He has mild hypokalemia on labs today. He will begin K dur 20 mEq daily.  He will return for a follow-up visit in 2 weeks. He will contact the office in the interim with any problems.    Plan reviewed with Dr. Benay Spice.    Ned Card ANP/GNP-BC   10/10/2016  9:24 AM

## 2016-10-10 NOTE — Patient Instructions (Addendum)
[facility] Discharge Instructions for Patients Receiving Chemotherapy  Today you received the following chemotherapy agents Leucovorin and Adrucil   To help prevent nausea and vomiting after your treatment, we encourage you to take your nausea medication as directed.    If you develop nausea and vomiting that is not controlled by your nausea medication, call the clinic.   BELOW ARE SYMPTOMS THAT SHOULD BE REPORTED IMMEDIATELY:  *FEVER GREATER THAN 100.5 F  *CHILLS WITH OR WITHOUT FEVER  NAUSEA AND VOMITING THAT IS NOT CONTROLLED WITH YOUR NAUSEA MEDICATION  *UNUSUAL SHORTNESS OF BREATH  *UNUSUAL BRUISING OR BLEEDING  TENDERNESS IN MOUTH AND THROAT WITH OR WITHOUT PRESENCE OF ULCERS  *URINARY PROBLEMS  *BOWEL PROBLEMS  UNUSUAL RASH Items with * indicate a potential emergency and should be followed up as soon as possible.  Feel free to call the clinic you have any questions or concerns. The clinic phone number is [phone].  Please show the CHEMO ALERT CARD at check-in to the Emergency Department and triage nurse.  Potassium Content of Foods Potassium is a mineral found in many foods and drinks. It helps keep fluids and minerals balanced in your body and affects how steadily your heart beats. Potassium also helps control your blood pressure and keep your muscles and nervous system healthy. Certain health conditions and medicines may change the balance of potassium in your body. When this happens, you can help balance your level of potassium through the foods that you do or do not eat. Your health care provider or dietitian may recommend an amount of potassium that you should have each day. The following lists of foods provide the amount of potassium (in parentheses) per serving in each item. High in potassium The following foods and beverages have 200 mg or more of potassium per serving:  Apricots, 2 raw or 5 dry (200 mg).  Artichoke, 1 medium (345  mg).  Avocado, raw,  each (245 mg).  Banana, 1 medium (425 mg).  Beans, lima, or baked beans, canned,  cup (280 mg).  Beans, white, canned,  cup (595 mg).  Beef roast, 3 oz (320 mg).  Beef, ground, 3 oz (270 mg).  Beets, raw or cooked,  cup (260 mg).  Bran muffin, 2 oz (300 mg).  Broccoli,  cup (230 mg).  Brussels sprouts,  cup (250 mg).  Cantaloupe,  cup (215 mg).  Cereal, 100% bran,  cup (200-400 mg).  Cheeseburger, single, fast food, 1 each (225-400 mg).  Chicken, 3 oz (220 mg).  Clams, canned, 3 oz (535 mg).  Crab, 3 oz (225 mg).  Dates, 5 each (270 mg).  Dried beans and peas,  cup (300-475 mg).  Figs, dried, 2 each (260 mg).  Fish: halibut, tuna, cod, snapper, 3 oz (480 mg).  Fish: salmon, haddock, swordfish, perch, 3 oz (300 mg).  Fish, tuna, canned 3 oz (200 mg).  French fries, fast food, 3 oz (470 mg).  Granola with fruit and nuts,  cup (200 mg).  Grapefruit juice,  cup (200 mg).  Greens, beet,  cup (655 mg).  Honeydew melon,  cup (200 mg).  Kale, raw, 1 cup (300 mg).  Kiwi, 1 medium (240 mg).  Kohlrabi, rutabaga, parsnips,  cup (280 mg).  Lentils,  cup (365 mg).  Mango, 1 each (325 mg).  Milk, chocolate, 1 cup (420 mg).  Milk: nonfat, low-fat, whole, buttermilk, 1 cup (350-380 mg).  Molasses, 1 Tbsp (295 mg).  Mushrooms,  cup (280) mg.  Nectarine,   1 each (275 mg).  Nuts: almonds, peanuts, hazelnuts, [place], cashew, mixed, 1 oz (200 mg).  Nuts, pistachios, 1 oz (295 mg).  Orange, 1 each (240 mg).  Orange juice,  cup (235 mg).  Papaya, medium,  fruit (390 mg).  Peanut butter, chunky, 2 Tbsp (240 mg).  Peanut butter, smooth, 2 Tbsp (210 mg).  Pear, 1 medium (200 mg).  Pomegranate, 1 whole (400 mg).  Pomegranate juice,  cup (215 mg).  Pork, 3 oz (350 mg).  Potato chips, salted, 1 oz (465 mg).  Potato, baked with skin, 1 medium (925 mg).  Potatoes, boiled,  cup (255 mg).  Potatoes, mashed,   cup (330 mg).  Prune juice,  cup (370 mg).  Prunes, 5 each (305 mg).  Pudding, chocolate,  cup (230 mg).  Pumpkin, canned,  cup (250 mg).  Raisins, seedless,  cup (270 mg).  Seeds, sunflower or pumpkin, 1 oz (240 mg).  Soy milk, 1 cup (300 mg).  Spinach,  cup (420 mg).  Spinach, canned,  cup (370 mg).  Sweet potato, baked with skin, 1 medium (450 mg).  Swiss chard,  cup (480 mg).  Tomato or vegetable juice,  cup (275 mg).  Tomato sauce or puree,  cup (400-550 mg).  Tomato, raw, 1 medium (290 mg).  Tomatoes, canned,  cup (200-300 mg).  Turkey, 3 oz (250 mg).  Wheat germ, 1 oz (250 mg).  Winter squash,  cup (250 mg).  Yogurt, plain or fruited, 6 oz (260-435 mg).  Zucchini,  cup (220 mg). Moderate in potassium The following foods and beverages have 50-200 mg of potassium per serving:  Apple, 1 each (150 mg).  Apple juice,  cup (150 mg).  Applesauce,  cup (90 mg).  Apricot nectar,  cup (140 mg).  Asparagus, small spears,  cup or 6 spears (155 mg).  Bagel, cinnamon raisin, 1 each (130 mg).  Bagel, egg or plain, 4 in., 1 each (70 mg).  Beans, green,  cup (90 mg).  Beans, yellow,  cup (190 mg).  Beer, regular, 12 oz (100 mg).  Beets, canned,  cup (125 mg).  Blackberries,  cup (115 mg).  Blueberries,  cup (60 mg).  Bread, whole wheat, 1 slice (70 mg).  Broccoli, raw,  cup (145 mg).  Cabbage,  cup (150 mg).  Carrots, cooked or raw,  cup (180 mg).  Cauliflower, raw,  cup (150 mg).  Celery, raw,  cup (155 mg).  Cereal, bran flakes, cup (120-150 mg).  Cheese, cottage,  cup (110 mg).  Cherries, 10 each (150 mg).  Chocolate, 1 oz bar (165 mg).  Coffee, brewed 6 oz (90 mg).  Corn,  cup or 1 ear (195 mg).  Cucumbers,  cup (80 mg).  Egg, large, 1 each (60 mg).  Eggplant,  cup (60 mg).  Endive, raw, cup (80 mg).  English muffin, 1 each (65 mg).  Fish, orange roughy, 3 oz (150 mg).  Frankfurter,  beef or pork, 1 each (75 mg).  Fruit cocktail,  cup (115 mg).  Grape juice,  cup (170 mg).  Grapefruit,  fruit (175 mg).  Grapes,  cup (155 mg).  Greens: kale, turnip, collard,  cup (110-150 mg).  Ice cream or frozen yogurt, chocolate,  cup (175 mg).  Ice cream or frozen yogurt, vanilla,  cup (120-150 mg).  Lemons, limes, 1 each (80 mg).  Lettuce, all types, 1 cup (100 mg).  Mixed vegetables,  cup (150 mg).  Mushrooms, raw,  cup (110 mg).  Nuts: walnuts,   pecans, or macadamia, 1 oz (125 mg).  Oatmeal,  cup (80 mg).  Okra,  cup (110 mg).  Onions, raw,  cup (120 mg).  Peach, 1 each (185 mg).  Peaches, canned,  cup (120 mg).  Pears, canned,  cup (120 mg).  Peas, green, frozen,  cup (90 mg).  Peppers, green,  cup (130 mg).  Peppers, red,  cup (160 mg).  Pineapple juice,  cup (165 mg).  Pineapple, fresh or canned,  cup (100 mg).  Plums, 1 each (105 mg).  Pudding, vanilla,  cup (150 mg).  Raspberries,  cup (90 mg).  Rhubarb,  cup (115 mg).  Rice, wild,  cup (80 mg).  Shrimp, 3 oz (155 mg).  Spinach, raw, 1 cup (170 mg).  Strawberries,  cup (125 mg).  Summer squash  cup (175-200 mg).  Swiss chard, raw, 1 cup (135 mg).  Tangerines, 1 each (140 mg).  Tea, brewed, 6 oz (65 mg).  Turnips,  cup (140 mg).  Watermelon,  cup (85 mg).  Wine, red, table, 5 oz (180 mg).  Wine, white, table, 5 oz (100 mg). Low in potassium The following foods and beverages have less than 50 mg of potassium per serving.  Bread, white, 1 slice (30 mg).  Carbonated beverages, 12 oz (less than 5 mg).  Cheese, 1 oz (20-30 mg).  Cranberries,  cup (45 mg).  Cranberry juice cocktail,  cup (20 mg).  Fats and oils, 1 Tbsp (less than 5 mg).  Hummus, 1 Tbsp (32 mg).  Nectar: papaya, mango, or pear,  cup (35 mg).  Rice, white or brown,  cup (50 mg).  Spaghetti or macaroni,  cup cooked (30 mg).  Tortilla, flour or corn, 1 each (50  mg).  Waffle, 4 in., 1 each (50 mg).  Water chestnuts,  cup (40 mg). This information is not intended to replace advice given to you by your health care provider. Make sure you discuss any questions you have with your health care provider. Document Released: 04/24/2005 Document Revised: 02/16/2016 Document Reviewed: 08/07/2013 Elsevier Interactive Patient Education  2017 [employer]  

## 2016-10-12 ENCOUNTER — Ambulatory Visit: Payer: BLUE CROSS/BLUE SHIELD

## 2016-10-12 ENCOUNTER — Ambulatory Visit (HOSPITAL_BASED_OUTPATIENT_CLINIC_OR_DEPARTMENT_OTHER): Payer: BLUE CROSS/BLUE SHIELD

## 2016-10-12 VITALS — BP 147/94 | HR 109 | Temp 98.6°F | Resp 18

## 2016-10-12 DIAGNOSIS — C160 Malignant neoplasm of cardia: Secondary | ICD-10-CM

## 2016-10-12 MED ORDER — HEPARIN SOD (PORK) LOCK FLUSH 100 UNIT/ML IV SOLN
500.0000 [IU] | Freq: Once | INTRAVENOUS | Status: AC | PRN
  Administered 2016-10-12: 500 [IU]
  Filled 2016-10-12: qty 5

## 2016-10-12 MED ORDER — SODIUM CHLORIDE 0.9% FLUSH
10.0000 mL | INTRAVENOUS | Status: DC | PRN
  Administered 2016-10-12: 10 mL
  Filled 2016-10-12: qty 10

## 2016-10-18 ENCOUNTER — Ambulatory Visit (HOSPITAL_COMMUNITY)
Admission: RE | Admit: 2016-10-18 | Discharge: 2016-10-18 | Disposition: A | Discharge status: Home / Self Care | Payer: BLUE CROSS/BLUE SHIELD | Source: Ambulatory Visit | Attending: Nurse Practitioner | Admitting: Nurse Practitioner

## 2016-10-18 DIAGNOSIS — R590 Localized enlarged lymph nodes: Secondary | ICD-10-CM | POA: Diagnosis not present

## 2016-10-18 DIAGNOSIS — C160 Malignant neoplasm of cardia: Secondary | ICD-10-CM | POA: Diagnosis present

## 2016-10-18 DIAGNOSIS — I2510 Atherosclerotic heart disease of native coronary artery without angina pectoris: Secondary | ICD-10-CM | POA: Insufficient documentation

## 2016-10-18 DIAGNOSIS — K5730 Diverticulosis of large intestine without perforation or abscess without bleeding: Secondary | ICD-10-CM | POA: Diagnosis not present

## 2016-10-18 MED ORDER — IOPAMIDOL (ISOVUE-300) INJECTION 61%
100.0000 mL | Freq: Once | INTRAVENOUS | Status: AC | PRN
  Administered 2016-10-18: 100 mL via INTRAVENOUS

## 2016-10-21 ENCOUNTER — Other Ambulatory Visit: Payer: Self-pay | Admitting: Oncology

## 2016-10-24 ENCOUNTER — Ambulatory Visit (HOSPITAL_BASED_OUTPATIENT_CLINIC_OR_DEPARTMENT_OTHER): Payer: BLUE CROSS/BLUE SHIELD | Admitting: Oncology

## 2016-10-24 ENCOUNTER — Other Ambulatory Visit: Payer: Self-pay | Admitting: *Deleted

## 2016-10-24 ENCOUNTER — Ambulatory Visit (HOSPITAL_BASED_OUTPATIENT_CLINIC_OR_DEPARTMENT_OTHER): Payer: BLUE CROSS/BLUE SHIELD

## 2016-10-24 ENCOUNTER — Other Ambulatory Visit (HOSPITAL_BASED_OUTPATIENT_CLINIC_OR_DEPARTMENT_OTHER): Payer: BLUE CROSS/BLUE SHIELD

## 2016-10-24 ENCOUNTER — Ambulatory Visit: Payer: BLUE CROSS/BLUE SHIELD

## 2016-10-24 ENCOUNTER — Telehealth: Payer: Self-pay | Admitting: Oncology

## 2016-10-24 VITALS — BP 132/86 | HR 90 | Temp 97.9°F | Resp 18 | Ht 66.0 in | Wt 171.4 lb

## 2016-10-24 DIAGNOSIS — Z95828 Presence of other vascular implants and grafts: Secondary | ICD-10-CM

## 2016-10-24 DIAGNOSIS — C770 Secondary and unspecified malignant neoplasm of lymph nodes of head, face and neck: Secondary | ICD-10-CM

## 2016-10-24 DIAGNOSIS — Z5111 Encounter for antineoplastic chemotherapy: Secondary | ICD-10-CM | POA: Diagnosis not present

## 2016-10-24 DIAGNOSIS — C160 Malignant neoplasm of cardia: Secondary | ICD-10-CM

## 2016-10-24 DIAGNOSIS — N201 Calculus of ureter: Secondary | ICD-10-CM

## 2016-10-24 DIAGNOSIS — G620 Drug-induced polyneuropathy: Secondary | ICD-10-CM

## 2016-10-24 DIAGNOSIS — I10 Essential (primary) hypertension: Secondary | ICD-10-CM

## 2016-10-24 DIAGNOSIS — E119 Type 2 diabetes mellitus without complications: Secondary | ICD-10-CM

## 2016-10-24 LAB — COMPREHENSIVE METABOLIC PANEL
ALBUMIN: 4 g/dL (ref 3.5–5.0)
ALK PHOS: 120 U/L (ref 40–150)
ALT: 30 U/L (ref 0–55)
ANION GAP: 11 meq/L (ref 3–11)
AST: 23 U/L (ref 5–34)
BILIRUBIN TOTAL: 0.42 mg/dL (ref 0.20–1.20)
BUN: 10.6 mg/dL (ref 7.0–26.0)
CO2: 23 mEq/L (ref 22–29)
Calcium: 10 mg/dL (ref 8.4–10.4)
Chloride: 107 mEq/L (ref 98–109)
Creatinine: 0.8 mg/dL (ref 0.7–1.3)
EGFR: >90 mL/min/{1.73_m2} (ref >90)
GLUCOSE: 140 mg/dL (ref 70–140)
POTASSIUM: 4.1 meq/L (ref 3.5–5.1)
Sodium: 141 mEq/L (ref 136–145)
TOTAL PROTEIN: 7.2 g/dL (ref 6.4–8.3)

## 2016-10-24 LAB — CBC WITH DIFFERENTIAL/PLATELET
BASO%: 0.4 % (ref 0.0–2.0)
BASOS ABS: 0 10*3/uL (ref 0.0–0.1)
EOS ABS: 0.7 10*3/uL — AB (ref 0.0–0.5)
EOS%: 8.7 % — ABNORMAL HIGH (ref 0.0–7.0)
HCT: 38.9 % (ref 38.4–49.9)
HGB: 13.9 g/dL (ref 13.0–17.1)
LYMPH%: 17.5 % (ref 14.0–49.0)
MCH: 32 pg (ref 27.2–33.4)
MCHC: 35.7 g/dL (ref 32.0–36.0)
MCV: 89.6 fL (ref 79.3–98.0)
MONO#: 1.1 10*3/uL — AB (ref 0.1–0.9)
MONO%: 12.8 % (ref 0.0–14.0)
NEUT%: 60.6 % (ref 39.0–75.0)
NEUTROS ABS: 5 10*3/uL (ref 1.5–6.5)
PLATELETS: 185 10*3/uL (ref 140–400)
RBC: 4.34 10*6/uL (ref 4.20–5.82)
RDW: 13.5 % (ref 11.0–14.6)
WBC: 8.3 10*3/uL (ref 4.0–10.3)
lymph#: 1.4 10*3/uL (ref 0.9–3.3)

## 2016-10-24 MED ORDER — FLUOROURACIL CHEMO INJECTION 5 GM/100ML
2400.0000 mg/m2 | INTRAVENOUS | Status: DC
  Administered 2016-10-24: 4300 mg via INTRAVENOUS
  Filled 2016-10-24: qty 86

## 2016-10-24 MED ORDER — ONDANSETRON HCL 8 MG PO TABS: 8.0000 mg | ORAL_TABLET | Freq: Three times a day (TID) | ORAL | 0 refills | Status: DC | PRN

## 2016-10-24 MED ORDER — DEXTROSE 5 % IV SOLN
400.0000 mg/m2 | Freq: Once | INTRAVENOUS | Status: AC
  Administered 2016-10-24: 720 mg via INTRAVENOUS
  Filled 2016-10-24: qty 36

## 2016-10-24 MED ORDER — DEXTROSE 5 % IV SOLN
Freq: Once | INTRAVENOUS | Status: AC
  Administered 2016-10-24: 09:00:00 via INTRAVENOUS

## 2016-10-24 MED ORDER — SODIUM CHLORIDE 0.9% FLUSH
10.0000 mL | INTRAVENOUS | Status: DC | PRN
  Administered 2016-10-24: 10 mL via INTRAVENOUS
  Filled 2016-10-24: qty 10

## 2016-10-24 MED ORDER — FLUOROURACIL CHEMO INJECTION 2.5 GM/50ML
400.0000 mg/m2 | Freq: Once | INTRAVENOUS | Status: AC
  Administered 2016-10-24: 700 mg via INTRAVENOUS
  Filled 2016-10-24: qty 14

## 2016-10-24 NOTE — Telephone Encounter (Signed)
Pt confirmed appt and received avs °

## 2016-10-24 NOTE — Patient Instructions (Signed)
[facility] Discharge Instructions for Patients Receiving Chemotherapy  Today you received the following chemotherapy agents Leucovorin and Adrucil   To help prevent nausea and vomiting after your treatment, we encourage you to take your nausea medication as directed.    If you develop nausea and vomiting that is not controlled by your nausea medication, call the clinic.   BELOW ARE SYMPTOMS THAT SHOULD BE REPORTED IMMEDIATELY:  *FEVER GREATER THAN 100.5 F  *CHILLS WITH OR WITHOUT FEVER  NAUSEA AND VOMITING THAT IS NOT CONTROLLED WITH YOUR NAUSEA MEDICATION  *UNUSUAL SHORTNESS OF BREATH  *UNUSUAL BRUISING OR BLEEDING  TENDERNESS IN MOUTH AND THROAT WITH OR WITHOUT PRESENCE OF ULCERS  *URINARY PROBLEMS  *BOWEL PROBLEMS  UNUSUAL RASH Items with * indicate a potential emergency and should be followed up as soon as possible.  Feel free to call the clinic you have any questions or concerns. The clinic phone number is [phone].  Please show the CHEMO ALERT CARD at check-in to the Emergency Department and triage nurse.  Potassium Content of Foods Potassium is a mineral found in many foods and drinks. It helps keep fluids and minerals balanced in your body and affects how steadily your heart beats. Potassium also helps control your blood pressure and keep your muscles and nervous system healthy. Certain health conditions and medicines may change the balance of potassium in your body. When this happens, you can help balance your level of potassium through the foods that you do or do not eat. Your health care provider or dietitian may recommend an amount of potassium that you should have each day. The following lists of foods provide the amount of potassium (in parentheses) per serving in each item. High in potassium The following foods and beverages have 200 mg or more of potassium per serving:  Apricots, 2 raw or 5 dry (200 mg).  Artichoke, 1 medium (345  mg).  Avocado, raw,  each (245 mg).  Banana, 1 medium (425 mg).  Beans, lima, or baked beans, canned,  cup (280 mg).  Beans, white, canned,  cup (595 mg).  Beef roast, 3 oz (320 mg).  Beef, ground, 3 oz (270 mg).  Beets, raw or cooked,  cup (260 mg).  Bran muffin, 2 oz (300 mg).  Broccoli,  cup (230 mg).  Brussels sprouts,  cup (250 mg).  Cantaloupe,  cup (215 mg).  Cereal, 100% bran,  cup (200-400 mg).  Cheeseburger, single, fast food, 1 each (225-400 mg).  Chicken, 3 oz (220 mg).  Clams, canned, 3 oz (535 mg).  Crab, 3 oz (225 mg).  Dates, 5 each (270 mg).  Dried beans and peas,  cup (300-475 mg).  Figs, dried, 2 each (260 mg).  Fish: halibut, tuna, cod, snapper, 3 oz (480 mg).  Fish: salmon, haddock, swordfish, perch, 3 oz (300 mg).  Fish, tuna, canned 3 oz (200 mg).  French fries, fast food, 3 oz (470 mg).  Granola with fruit and nuts,  cup (200 mg).  Grapefruit juice,  cup (200 mg).  Greens, beet,  cup (655 mg).  Honeydew melon,  cup (200 mg).  Kale, raw, 1 cup (300 mg).  Kiwi, 1 medium (240 mg).  Kohlrabi, rutabaga, parsnips,  cup (280 mg).  Lentils,  cup (365 mg).  Mango, 1 each (325 mg).  Milk, chocolate, 1 cup (420 mg).  Milk: nonfat, low-fat, whole, buttermilk, 1 cup (350-380 mg).  Molasses, 1 Tbsp (295 mg).  Mushrooms,  cup (280) mg.  Nectarine,   1 each (275 mg).  Nuts: almonds, peanuts, hazelnuts, [place], cashew, mixed, 1 oz (200 mg).  Nuts, pistachios, 1 oz (295 mg).  Orange, 1 each (240 mg).  Orange juice,  cup (235 mg).  Papaya, medium,  fruit (390 mg).  Peanut butter, chunky, 2 Tbsp (240 mg).  Peanut butter, smooth, 2 Tbsp (210 mg).  Pear, 1 medium (200 mg).  Pomegranate, 1 whole (400 mg).  Pomegranate juice,  cup (215 mg).  Pork, 3 oz (350 mg).  Potato chips, salted, 1 oz (465 mg).  Potato, baked with skin, 1 medium (925 mg).  Potatoes, boiled,  cup (255 mg).  Potatoes, mashed,   cup (330 mg).  Prune juice,  cup (370 mg).  Prunes, 5 each (305 mg).  Pudding, chocolate,  cup (230 mg).  Pumpkin, canned,  cup (250 mg).  Raisins, seedless,  cup (270 mg).  Seeds, sunflower or pumpkin, 1 oz (240 mg).  Soy milk, 1 cup (300 mg).  Spinach,  cup (420 mg).  Spinach, canned,  cup (370 mg).  Sweet potato, baked with skin, 1 medium (450 mg).  Swiss chard,  cup (480 mg).  Tomato or vegetable juice,  cup (275 mg).  Tomato sauce or puree,  cup (400-550 mg).  Tomato, raw, 1 medium (290 mg).  Tomatoes, canned,  cup (200-300 mg).  Turkey, 3 oz (250 mg).  Wheat germ, 1 oz (250 mg).  Winter squash,  cup (250 mg).  Yogurt, plain or fruited, 6 oz (260-435 mg).  Zucchini,  cup (220 mg). Moderate in potassium The following foods and beverages have 50-200 mg of potassium per serving:  Apple, 1 each (150 mg).  Apple juice,  cup (150 mg).  Applesauce,  cup (90 mg).  Apricot nectar,  cup (140 mg).  Asparagus, small spears,  cup or 6 spears (155 mg).  Bagel, cinnamon raisin, 1 each (130 mg).  Bagel, egg or plain, 4 in., 1 each (70 mg).  Beans, green,  cup (90 mg).  Beans, yellow,  cup (190 mg).  Beer, regular, 12 oz (100 mg).  Beets, canned,  cup (125 mg).  Blackberries,  cup (115 mg).  Blueberries,  cup (60 mg).  Bread, whole wheat, 1 slice (70 mg).  Broccoli, raw,  cup (145 mg).  Cabbage,  cup (150 mg).  Carrots, cooked or raw,  cup (180 mg).  Cauliflower, raw,  cup (150 mg).  Celery, raw,  cup (155 mg).  Cereal, bran flakes, cup (120-150 mg).  Cheese, cottage,  cup (110 mg).  Cherries, 10 each (150 mg).  Chocolate, 1 oz bar (165 mg).  Coffee, brewed 6 oz (90 mg).  Corn,  cup or 1 ear (195 mg).  Cucumbers,  cup (80 mg).  Egg, large, 1 each (60 mg).  Eggplant,  cup (60 mg).  Endive, raw, cup (80 mg).  English muffin, 1 each (65 mg).  Fish, orange roughy, 3 oz (150 mg).  Frankfurter,  beef or pork, 1 each (75 mg).  Fruit cocktail,  cup (115 mg).  Grape juice,  cup (170 mg).  Grapefruit,  fruit (175 mg).  Grapes,  cup (155 mg).  Greens: kale, turnip, collard,  cup (110-150 mg).  Ice cream or frozen yogurt, chocolate,  cup (175 mg).  Ice cream or frozen yogurt, vanilla,  cup (120-150 mg).  Lemons, limes, 1 each (80 mg).  Lettuce, all types, 1 cup (100 mg).  Mixed vegetables,  cup (150 mg).  Mushrooms, raw,  cup (110 mg).  Nuts: walnuts,   pecans, or macadamia, 1 oz (125 mg).  Oatmeal,  cup (80 mg).  Okra,  cup (110 mg).  Onions, raw,  cup (120 mg).  Peach, 1 each (185 mg).  Peaches, canned,  cup (120 mg).  Pears, canned,  cup (120 mg).  Peas, green, frozen,  cup (90 mg).  Peppers, green,  cup (130 mg).  Peppers, red,  cup (160 mg).  Pineapple juice,  cup (165 mg).  Pineapple, fresh or canned,  cup (100 mg).  Plums, 1 each (105 mg).  Pudding, vanilla,  cup (150 mg).  Raspberries,  cup (90 mg).  Rhubarb,  cup (115 mg).  Rice, wild,  cup (80 mg).  Shrimp, 3 oz (155 mg).  Spinach, raw, 1 cup (170 mg).  Strawberries,  cup (125 mg).  Summer squash  cup (175-200 mg).  Swiss chard, raw, 1 cup (135 mg).  Tangerines, 1 each (140 mg).  Tea, brewed, 6 oz (65 mg).  Turnips,  cup (140 mg).  Watermelon,  cup (85 mg).  Wine, red, table, 5 oz (180 mg).  Wine, white, table, 5 oz (100 mg). Low in potassium The following foods and beverages have less than 50 mg of potassium per serving.  Bread, white, 1 slice (30 mg).  Carbonated beverages, 12 oz (less than 5 mg).  Cheese, 1 oz (20-30 mg).  Cranberries,  cup (45 mg).  Cranberry juice cocktail,  cup (20 mg).  Fats and oils, 1 Tbsp (less than 5 mg).  Hummus, 1 Tbsp (32 mg).  Nectar: papaya, mango, or pear,  cup (35 mg).  Rice, white or brown,  cup (50 mg).  Spaghetti or macaroni,  cup cooked (30 mg).  Tortilla, flour or corn, 1 each (50  mg).  Waffle, 4 in., 1 each (50 mg).  Water chestnuts,  cup (40 mg). This information is not intended to replace advice given to you by your health care provider. Make sure you discuss any questions you have with your health care provider. Document Released: 04/24/2005 Document Revised: 02/16/2016 Document Reviewed: 08/07/2013 Elsevier Interactive Patient Education  2017 Elsevier Inc.  

## 2016-10-24 NOTE — Progress Notes (Signed)
  [place] OFFICE PROGRESS NOTE   Diagnosis: Gastric cancer  INTERVAL HISTORY:   Robert Beck returns as scheduled. He completed another cycle of 5-fluorouracil on 10/10/2016. He feels well. No dysphagia or chest pain. No palpable lymph nodes. The neuropathy symptoms in the hands and feet have progressed. He was placed on gabapentin by Dr. Virgina Jock and has noted partial improvement.  Objective:  Vital signs in last 24 hours:  Blood pressure 132/86, pulse 90, temperature 97.9 F (36.6 C), temperature source Oral, resp. rate 18, height 5\' 6"  (1.676 m), weight 171 lb 6.4 oz (77.7 kg), SpO2 98 %.    HEENT: No thrush or ulcers, neck without mass Lymphatics: No cervical or supra-clavicular nodes Resp: Lungs with coarse inspiratory rhonchi at the posterior chest bilaterally, no respiratory distress Cardio: Regular rate and rhythm GI: No hepatomegaly Vascular: No leg edema   Portacath/PICC-without erythema  Lab Results:  Lab Results  Component Value Date   WBC 8.3 10/24/2016   HGB 13.9 10/24/2016   HCT 38.9 10/24/2016   MCV 89.6 10/24/2016   PLT 185 10/24/2016   NEUTROABS 5.0 10/24/2016     Medications: I have reviewed the patient's current medications.  Assessment/Plan: 1. Gastric cancer, gastric cardia mass standing to the GE junction confirmed on endoscopy 04/11/2016, biopsy confirmed adenocarcinoma ? Staging CTs of the chest, abdomen, and pelvis 04/09/2016-gastric cardia mass, mediastinal, right hilar, and right supraclavicular lymphadenopathy ? 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 Cycle 1 FOLFOX 04/30/2016 (oxaliplatin given 04/30/2016, 5-FU infusion started 05/02/2016) ? Cycle 2 FOLFOX 05/14/2016 ? Cycle 3 FOLFOX 05/30/2016 ? Cycle 4 FOLFOX 06/13/2016 ? Cycle 5 FOLFOX 06/27/2016 ? Cycle 6 FOLFOX 07/11/2016 ? CTs 07/20/2016-decrease in supraclavicular, mediastinal, and gastrohepatic adenopathy.  Decreased gastric cardia wall thickening. ? Cycle 7 FOLFOX 07/25/2016 ? Cycle 8 FOLFOX 08/08/2016 ? Cycle 9 FOLFOX 08/22/2016 (oxaliplatin held due to neuropathy) ? Cycle 10 FOLFOX 09/05/2016 (oxaliplatin held) ? Cycle 11 FOLFOX 09/26/2016 (oxaliplatin held) ? Cycle 12 FOLFOX 10/10/2016 (oxaliplatin held) ? CT 10/18/2016-mild enlargement of mediastinal lymph nodes, no other evidence of disease progression, stable thickening at the GE junction and stable gastrohepatic lymphadenopathy ? Cycle 13 FOLFOX 10/24/2016 (oxaliplatin held) 2. Anemia secondary to #1-improved  3. Diabetes  4. Gout  5. Hypertension  6. Hyperlipidemia  7. History of a T7 compression fracture  8. Pain secondary to the gastric mass and mediastinal lymphadenopathy-resolved  9. Port-A-Cath placement 04/20/2016  10. 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 1 mm obstructing stone distal right ureter/right ureteral vesicle junction on CT 09/13/2016.    Disposition: Robert Beck appears unchanged. His symptoms related to the gastric cancer have improved significantly since beginning systemic therapy. The restaging CT shows minimal enlargement of mediastinal lymph nodes, but no other evidence of disease progression. We discussed treatment options including a treatment break, switching to maintenance 5-fluorouracil, or switching to a different chemotherapy. We decided to proceed with maintenance 5-fluorouracil. We will try to get insurance approval for capecitabine. He will complete another cycle of 5-fluorouracil today and return for an office visit in 2 weeks.  25 minutes were spent with the patient today. The majority of the time was used for counseling and coordination of care.   Betsy Coder, MD  10/24/2016  9:10 AM

## 2016-10-26 ENCOUNTER — Other Ambulatory Visit: Payer: Self-pay | Admitting: *Deleted

## 2016-10-26 ENCOUNTER — Telehealth: Payer: Self-pay | Admitting: Pharmacist

## 2016-10-26 ENCOUNTER — Ambulatory Visit (HOSPITAL_BASED_OUTPATIENT_CLINIC_OR_DEPARTMENT_OTHER): Payer: BLUE CROSS/BLUE SHIELD

## 2016-10-26 VITALS — BP 128/93 | HR 99 | Temp 98.4°F | Resp 18

## 2016-10-26 DIAGNOSIS — C770 Secondary and unspecified malignant neoplasm of lymph nodes of head, face and neck: Secondary | ICD-10-CM

## 2016-10-26 DIAGNOSIS — C160 Malignant neoplasm of cardia: Secondary | ICD-10-CM

## 2016-10-26 MED ORDER — HEPARIN SOD (PORK) LOCK FLUSH 100 UNIT/ML IV SOLN
500.0000 [IU] | Freq: Once | INTRAVENOUS | Status: AC | PRN
  Administered 2016-10-26: 500 [IU]
  Filled 2016-10-26: qty 5

## 2016-10-26 MED ORDER — CAPECITABINE 500 MG PO TABS: ORAL_TABLET | ORAL | 0 refills | Status: DC

## 2016-10-26 MED ORDER — SODIUM CHLORIDE 0.9% FLUSH
10.0000 mL | INTRAVENOUS | Status: DC | PRN
  Administered 2016-10-26: 10 mL
  Filled 2016-10-26: qty 10

## 2016-10-26 NOTE — Telephone Encounter (Signed)
Oral Chemotherapy Pharmacist Encounter  Received new hand-written prescription for Xeloda for patient with metastatic gastric cancer. Noted patient with last 5-FU infusion on 1/31 so Xeloda start date anticipated as 2/19 /18. Labs from 1/31 reviewed, ok for treatment. Current medication list in Epic assessed, no significant DDIs with Xeloda identified.  Noted patient already has approved prior authorization until 04/25/2017 for slightly different dosing so will not initiate another PA at this time.  Patient with [employer]. Prescription, clinical info, and current PA info faxed to [facility] at [phone]. [facility] phone: [phone].  Oral Oncology Clinic will continue to follow.  Johny Drilling, PharmD, BCPS, BCOP 10/26/2016  10:26 AM Oral Oncology Clinic [phone]

## 2016-11-04 ENCOUNTER — Other Ambulatory Visit: Payer: Self-pay | Admitting: Oncology

## 2016-11-05 MED ORDER — CAPECITABINE 500 MG PO TABS: ORAL_TABLET | ORAL | 0 refills | Status: DC

## 2016-11-05 NOTE — Telephone Encounter (Signed)
Oral Chemotherapy Pharmacist Encounter  Received notification from [facility] that patient's Xeloda prescription has been transferred to their [place], due to insurance requirement. As the office did not send the Rx there ourselves, they had reached out for prescription clarification. I went over prescription with [facility] pharmacist.  They will start processing prescription immediately and will reach out to the office with questions or concerns.  I updated prescription in Epic to reflect who will actually be filling prescription.  Oral Oncology Clinic will continue to follow.  Johny Drilling, PharmD, BCPS, BCOP 11/05/2016  2:42 PM Oral Oncology Clinic [phone]

## 2016-11-07 ENCOUNTER — Telehealth: Payer: Self-pay

## 2016-11-07 ENCOUNTER — Ambulatory Visit (HOSPITAL_BASED_OUTPATIENT_CLINIC_OR_DEPARTMENT_OTHER): Payer: BLUE CROSS/BLUE SHIELD | Admitting: Oncology

## 2016-11-07 ENCOUNTER — Other Ambulatory Visit (HOSPITAL_BASED_OUTPATIENT_CLINIC_OR_DEPARTMENT_OTHER): Payer: BLUE CROSS/BLUE SHIELD

## 2016-11-07 ENCOUNTER — Ambulatory Visit: Payer: BLUE CROSS/BLUE SHIELD

## 2016-11-07 ENCOUNTER — Telehealth: Payer: Self-pay | Admitting: Oncology

## 2016-11-07 VITALS — BP 136/91 | HR 87 | Temp 97.9°F | Resp 17 | Ht 66.0 in | Wt 171.4 lb

## 2016-11-07 DIAGNOSIS — C160 Malignant neoplasm of cardia: Secondary | ICD-10-CM

## 2016-11-07 DIAGNOSIS — Z95828 Presence of other vascular implants and grafts: Secondary | ICD-10-CM

## 2016-11-07 DIAGNOSIS — C770 Secondary and unspecified malignant neoplasm of lymph nodes of head, face and neck: Secondary | ICD-10-CM

## 2016-11-07 LAB — CBC WITH DIFFERENTIAL/PLATELET
BASO%: 0.6 % (ref 0.0–2.0)
BASOS ABS: 0 10*3/uL (ref 0.0–0.1)
EOS ABS: 0.6 10*3/uL — AB (ref 0.0–0.5)
EOS%: 8.6 % — ABNORMAL HIGH (ref 0.0–7.0)
HCT: 37.2 % — ABNORMAL LOW (ref 38.4–49.9)
HGB: 12.9 g/dL — ABNORMAL LOW (ref 13.0–17.1)
LYMPH%: 18.2 % (ref 14.0–49.0)
MCH: 31.6 pg (ref 27.2–33.4)
MCHC: 34.7 g/dL (ref 32.0–36.0)
MCV: 91.1 fL (ref 79.3–98.0)
MONO#: 0.6 10*3/uL (ref 0.1–0.9)
MONO%: 8.6 % (ref 0.0–14.0)
NEUT%: 64 % (ref 39.0–75.0)
NEUTROS ABS: 4.8 10*3/uL (ref 1.5–6.5)
Platelets: 169 10*3/uL (ref 140–400)
RBC: 4.08 10*6/uL — AB (ref 4.20–5.82)
RDW: 14.3 % (ref 11.0–14.6)
WBC: 7.5 10*3/uL (ref 4.0–10.3)
lymph#: 1.4 10*3/uL (ref 0.9–3.3)

## 2016-11-07 MED ORDER — HEPARIN SOD (PORK) LOCK FLUSH 100 UNIT/ML IV SOLN
500.0000 [IU] | Freq: Once | INTRAVENOUS | Status: DC | PRN
  Filled 2016-11-07: qty 5

## 2016-11-07 MED ORDER — HEPARIN SOD (PORK) LOCK FLUSH 100 UNIT/ML IV SOLN
500.0000 [IU] | Freq: Once | INTRAVENOUS | Status: AC | PRN
  Administered 2016-11-07: 500 [IU] via INTRAVENOUS
  Filled 2016-11-07: qty 5

## 2016-11-07 MED ORDER — SODIUM CHLORIDE 0.9% FLUSH
10.0000 mL | INTRAVENOUS | Status: DC | PRN
  Administered 2016-11-07: 10 mL via INTRAVENOUS
  Filled 2016-11-07: qty 10

## 2016-11-07 NOTE — Progress Notes (Signed)
  [facility] OFFICE PROGRESS NOTE   Diagnosis: Gastric cancer  INTERVAL HISTORY:   Robert Beck returns as scheduled. He completed another cycle of 5-fluorouracil on 10/24/2016. No mouth sores. He has persistent neuropathy symptoms in the hands and feet. This has not improved. He reports having diarrhea every other day following chemotherapy. No dysphagia. He has not received insurance approval for Xeloda.  Objective:  Vital signs in last 24 hours:  Blood pressure (!) 136/91, pulse 87, temperature 97.9 F (36.6 C), temperature source Oral, resp. rate 17, height 5\' 6"  (1.676 m), weight 171 lb 6.4 oz (77.7 kg), SpO2 97 %.    HEENT: No thrush or ulcers Lymphatics: No cervical or supraclavicular nodes Resp: Lungs clear bilaterally Cardio: Regular rate and rhythm GI: No hepatomegaly, nontender Vascular: No leg edema  Skin: Palms without erythema   Portacath/PICC-without erythema  Lab Results:  Lab Results  Component Value Date   WBC 7.5 11/07/2016   HGB 12.9 (L) 11/07/2016   HCT 37.2 (L) 11/07/2016   MCV 91.1 11/07/2016   PLT 169 11/07/2016   NEUTROABS 4.8 11/07/2016     Medications: I have reviewed the patient's current medications.  Assessment/Plan: 1. Gastric cancer, gastric cardia mass standing to the GE junction confirmed on endoscopy 04/11/2016, biopsy confirmed adenocarcinoma ? Staging CTs of the chest, abdomen, and pelvis 04/09/2016-gastric cardia mass, mediastinal, right hilar, and right supraclavicular lymphadenopathy ? 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 Cycle 1 FOLFOX 04/30/2016 (oxaliplatin given 04/30/2016, 5-FU infusion started 05/02/2016) ? Cycle 2 FOLFOX 05/14/2016 ? Cycle 3 FOLFOX 05/30/2016 ? Cycle 4 FOLFOX 06/13/2016 ? Cycle 5 FOLFOX 06/27/2016 ? Cycle 6 FOLFOX 07/11/2016 ? CTs 07/20/2016-decrease in supraclavicular, mediastinal, and gastrohepatic adenopathy. Decreased  gastric cardia wall thickening. ? Cycle 7 FOLFOX 07/25/2016 ? Cycle 8 FOLFOX 08/08/2016 ? Cycle 9 FOLFOX 08/22/2016 (oxaliplatin held due to neuropathy) ? Cycle 10 FOLFOX 09/05/2016 (oxaliplatin held) ? Cycle 11 FOLFOX 09/26/2016 (oxaliplatin held) ? Cycle 12 FOLFOX 10/10/2016 (oxaliplatin held) ? CT 10/18/2016-mild enlargement of mediastinal lymph nodes, no other evidence of disease progression, stable thickening at the GE junction and stable gastrohepatic lymphadenopathy ? Cycle 13 FOLFOX 10/24/2016 (oxaliplatin held) 2. Anemia secondary to #1-improved  3. Diabetes  4. Gout  5. Hypertension  6. Hyperlipidemia  7. History of a T7 compression fracture  8. Pain secondary to the gastric mass and mediastinal lymphadenopathy-resolved  9. Port-A-Cath placement 04/20/2016  10. 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 1 mm obstructing stone distal right ureter/right ureteral vesicle junction on CT 09/13/2016.     Disposition:  Robert Beck appears stable. He is waiting on insurance approval for capecitabine. He would like a treatment break. He understands the metastatic gastric cancer will progress at some point. We discussed a treatment break, continuing IV 5-FU, and switching to a different systemic therapy. He would like a treatment break for now. He will consider beginning "maintenance "capecitabine if he received insurance approval.  He will return for an office and lab visit in 3 weeks.  Robert Coder, MD  11/07/2016  10:56 AM

## 2016-11-07 NOTE — Telephone Encounter (Signed)
Appointments scheduled per 11/07/16 los. Patient was given a copy of the AVS report and appointment schedule per 11/07/16 los.  ° °

## 2016-11-07 NOTE — Telephone Encounter (Signed)
Xeloda Update:  RX received PA and [facility] was contacted and they had run prescription thru on 11/05/16 with a paid claim and copay of $437.14.  [facility] will contact Robert Beck and discuss copay/foundation concerns with him.  If he needs to apply for assistance [facility] will assist him.  We will call back on 11/08/16 to check on the status.  Thank you  Henreitta Leber, PharmD Oral Oncology [facility]

## 2016-11-13 NOTE — Telephone Encounter (Signed)
Oral Chemotherapy Pharmacist Encounter  I called [facility] at [phone] to follow up on status of patient's Xeloda Per [facility], they have attempted on 3 separate occasions to contact patient to inform him of copayment and to schedule delivery. His medication has been ready for delivery since 11/08/16.  I called patient's cell phone number and voicemail was full, unable to leave a message I called patient's home number and LVM to call [facility] at [phone] to schedule Xeloda delivery.  MD will be notified.  Oral Oncology Clinic will continue to follow.  Johny Drilling, PharmD, BCPS, BCOP 11/13/2016  4:30 PM Oral Oncology Clinic [phone]

## 2016-11-15 MED ORDER — ONDANSETRON HCL 8 MG PO TABS: 8.0000 mg | ORAL_TABLET | Freq: Three times a day (TID) | ORAL | 1 refills | Status: DC | PRN

## 2016-11-15 NOTE — Telephone Encounter (Signed)
Pt's wife returned call: Xeloda will be delivered on 2/23. He is getting co-pay assistance from [employer]. Confirmed he should begin Xeloda when he receives it. Instructions reviewed.

## 2016-11-19 NOTE — Telephone Encounter (Signed)
Oral Chemotherapy Pharmacist Encounter  Patient [facility]) diagnosis verification form received in the office, completed, and faxed back to [facility] at [place], PharmD, BCPS, BCOP 11/19/2016  11:09 AM Oral Oncology Clinic [phone]

## 2016-11-28 ENCOUNTER — Telehealth: Payer: Self-pay | Admitting: Oncology

## 2016-11-28 ENCOUNTER — Ambulatory Visit (HOSPITAL_BASED_OUTPATIENT_CLINIC_OR_DEPARTMENT_OTHER): Payer: BLUE CROSS/BLUE SHIELD

## 2016-11-28 ENCOUNTER — Ambulatory Visit (HOSPITAL_BASED_OUTPATIENT_CLINIC_OR_DEPARTMENT_OTHER): Payer: BLUE CROSS/BLUE SHIELD | Admitting: Nurse Practitioner

## 2016-11-28 ENCOUNTER — Other Ambulatory Visit (HOSPITAL_BASED_OUTPATIENT_CLINIC_OR_DEPARTMENT_OTHER): Payer: BLUE CROSS/BLUE SHIELD

## 2016-11-28 DIAGNOSIS — C160 Malignant neoplasm of cardia: Secondary | ICD-10-CM | POA: Diagnosis not present

## 2016-11-28 DIAGNOSIS — M109 Gout, unspecified: Secondary | ICD-10-CM

## 2016-11-28 DIAGNOSIS — E119 Type 2 diabetes mellitus without complications: Secondary | ICD-10-CM | POA: Diagnosis not present

## 2016-11-28 DIAGNOSIS — G620 Drug-induced polyneuropathy: Secondary | ICD-10-CM

## 2016-11-28 DIAGNOSIS — I10 Essential (primary) hypertension: Secondary | ICD-10-CM

## 2016-11-28 DIAGNOSIS — Z95828 Presence of other vascular implants and grafts: Secondary | ICD-10-CM

## 2016-11-28 DIAGNOSIS — C770 Secondary and unspecified malignant neoplasm of lymph nodes of head, face and neck: Secondary | ICD-10-CM

## 2016-11-28 DIAGNOSIS — E785 Hyperlipidemia, unspecified: Secondary | ICD-10-CM | POA: Diagnosis not present

## 2016-11-28 DIAGNOSIS — N201 Calculus of ureter: Secondary | ICD-10-CM

## 2016-11-28 LAB — COMPREHENSIVE METABOLIC PANEL
ALT: 28 U/L (ref 0–55)
ANION GAP: 12 meq/L — AB (ref 3–11)
AST: 26 U/L (ref 5–34)
Albumin: 4.2 g/dL (ref 3.5–5.0)
Alkaline Phosphatase: 100 U/L (ref 40–150)
BUN: 14.5 mg/dL (ref 7.0–26.0)
CALCIUM: 10.8 mg/dL — AB (ref 8.4–10.4)
CHLORIDE: 107 meq/L (ref 98–109)
CO2: 22 mEq/L (ref 22–29)
CREATININE: 0.9 mg/dL (ref 0.7–1.3)
EGFR: >90 mL/min/{1.73_m2} (ref >90)
Glucose: 171 mg/dl — ABNORMAL HIGH (ref 70–140)
POTASSIUM: 3.5 meq/L (ref 3.5–5.1)
Sodium: 142 mEq/L (ref 136–145)
Total Bilirubin: 0.72 mg/dL (ref 0.20–1.20)
Total Protein: 7.3 g/dL (ref 6.4–8.3)

## 2016-11-28 LAB — CBC WITH DIFFERENTIAL/PLATELET
BASO%: 0.3 % (ref 0.0–2.0)
BASOS ABS: 0 10*3/uL (ref 0.0–0.1)
EOS%: 3.9 % (ref 0.0–7.0)
Eosinophils Absolute: 0.4 10*3/uL (ref 0.0–0.5)
HEMATOCRIT: 40.9 % (ref 38.4–49.9)
HGB: 14.5 g/dL (ref 13.0–17.1)
LYMPH#: 2.2 10*3/uL (ref 0.9–3.3)
LYMPH%: 22.2 % (ref 14.0–49.0)
MCH: 31.5 pg (ref 27.2–33.4)
MCHC: 35.5 g/dL (ref 32.0–36.0)
MCV: 88.9 fL (ref 79.3–98.0)
MONO#: 0.7 10*3/uL (ref 0.1–0.9)
MONO%: 6.7 % (ref 0.0–14.0)
NEUT#: 6.7 10*3/uL — ABNORMAL HIGH (ref 1.5–6.5)
NEUT%: 66.9 % (ref 39.0–75.0)
PLATELETS: 284 10*3/uL (ref 140–400)
RBC: 4.6 10*6/uL (ref 4.20–5.82)
RDW: 14 % (ref 11.0–14.6)
WBC: 10 10*3/uL (ref 4.0–10.3)

## 2016-11-28 MED ORDER — POTASSIUM CHLORIDE CRYS ER 20 MEQ PO TBCR: 20.0000 meq | EXTENDED_RELEASE_TABLET | Freq: Every day | ORAL | 1 refills | Status: DC

## 2016-11-28 MED ORDER — SODIUM CHLORIDE 0.9% FLUSH
10.0000 mL | INTRAVENOUS | Status: DC | PRN
  Administered 2016-11-28: 10 mL via INTRAVENOUS
  Filled 2016-11-28: qty 10

## 2016-11-28 MED ORDER — HEPARIN SOD (PORK) LOCK FLUSH 100 UNIT/ML IV SOLN
500.0000 [IU] | Freq: Once | INTRAVENOUS | Status: AC | PRN
  Administered 2016-11-28: 500 [IU] via INTRAVENOUS
  Filled 2016-11-28: qty 5

## 2016-11-28 NOTE — Progress Notes (Signed)
  [place] OFFICE PROGRESS NOTE   Diagnosis:  Gastric cancer  INTERVAL HISTORY:   Robert Beck returns as scheduled. He began Xeloda 7 days on/7 days off on 11/17/2016. He denies significant nausea. No mouth sores. Diarrhea is better. He has not required Imodium. No hand or foot pain or redness. He notes an increase in neuropathy symptoms involving the feet.  Objective:  Vital signs in last 24 hours:  Blood pressure 103/74, pulse 99, temperature 98.6 F (37 C), temperature source Oral, resp. rate 20, height 5\' 6"  (1.676 m), weight 169 lb 8 oz (76.9 kg), SpO2 95 %.    HEENT: No thrush or ulcers. Lymphatics: No palpable cervical or supra clavicular lymph nodes. Resp: Lungs clear bilaterally. Cardio: Regular rate and rhythm. GI: Abdomen soft and nontender. No hepatomegaly. Vascular: No leg edema. Calves soft and nontender. Skin: Palms without erythema. Port-A-Cath without erythema.    Lab Results:  Lab Results  Component Value Date   WBC 10.0 11/28/2016   HGB 14.5 11/28/2016   HCT 40.9 11/28/2016   MCV 88.9 11/28/2016   PLT 284 11/28/2016   NEUTROABS 6.7 (H) 11/28/2016    Imaging:  No results found.  Medications: I have reviewed the patient's current medications.  Assessment/Plan: 1. Gastric cancer, gastric cardia mass standing to the GE junction confirmed on endoscopy 04/11/2016, biopsy confirmed adenocarcinoma ? Staging CTs of the chest, abdomen, and pelvis 04/09/2016-gastric cardia mass, mediastinal, right hilar, and right supraclavicular lymphadenopathy ? 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 Cycle 1 FOLFOX 04/30/2016 (oxaliplatin given 04/30/2016, 5-FU infusion started 05/02/2016) ? Cycle 2 FOLFOX 05/14/2016 ? Cycle 3 FOLFOX 05/30/2016 ? Cycle 4 FOLFOX 06/13/2016 ? Cycle 5 FOLFOX 06/27/2016 ? Cycle 6 FOLFOX 07/11/2016 ? CTs 07/20/2016-decrease in supraclavicular, mediastinal, and  gastrohepatic adenopathy. Decreased gastric cardia wall thickening. ? Cycle 7 FOLFOX 07/25/2016 ? Cycle 8 FOLFOX 08/08/2016 ? Cycle 9 FOLFOX 08/22/2016 (oxaliplatin held due to neuropathy) ? Cycle 10 FOLFOX 09/05/2016 (oxaliplatin held) ? Cycle 11 FOLFOX 09/26/2016 (oxaliplatin held) ? Cycle 12 FOLFOX 10/10/2016 (oxaliplatin held) ? CT 10/18/2016-mild enlargement of mediastinal lymph nodes, no other evidence of disease progression, stable thickening at the GE junction and stable gastrohepatic lymphadenopathy ? Cycle 13 FOLFOX 10/24/2016 (oxaliplatin held) ? Maintenance Xeloda 7 days on/7 days off beginning 11/17/2016 2. Anemia secondary to #1-improved  3. Diabetes  4. Gout  5. Hypertension  6. Hyperlipidemia  7. History of a T7 compression fracture  8. Pain secondary to the gastric mass and mediastinal lymphadenopathy-resolved  9. Port-A-Cath placement 04/20/2016  10. 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 1 mm obstructing stone distal right ureter/right ureteral vesicle junction on CT 09/13/2016.     Disposition: Robert Beck appears stable. He will continue Xeloda 7 days on/7 days off. He will return for a follow-up visit in 3 weeks. The plan is to obtain restaging CT scans at a three-month interval from the previous. He understands to contact the office with mouth sores, diarrhea not controlled with Imodium, symptoms of hand-foot syndrome.  Plan reviewed with Dr. Benay Spice.    Ned Card ANP/GNP-BC   11/28/2016  10:33 AM

## 2016-11-28 NOTE — Patient Instructions (Signed)
Implanted Port Home Guide An implanted port is a type of central line that is placed under the skin. Central lines are used to provide IV access when treatment or nutrition needs to be given through a person's veins. Implanted ports are used for long-term IV access. An implanted port may be placed because:  You need IV medicine that would be irritating to the small veins in your hands or arms.  You need long-term IV medicines, such as antibiotics.  You need IV nutrition for a long period.  You need frequent blood draws for lab tests.  You need dialysis.  Implanted ports are usually placed in the chest area, but they can also be placed in the upper arm, the abdomen, or the leg. An implanted port has two main parts:  Reservoir. The reservoir is round and will appear as a small, raised area under your skin. The reservoir is the part where a needle is inserted to give medicines or draw blood.  Catheter. The catheter is a thin, flexible tube that extends from the reservoir. The catheter is placed into a large vein. Medicine that is inserted into the reservoir goes into the catheter and then into the vein.  How will I care for my incision site? Do not get the incision site wet. Bathe or shower as directed by your health care provider. How is my port accessed? Special steps must be taken to access the port:  Before the port is accessed, a numbing cream can be placed on the skin. This helps numb the skin over the port site.  Your health care provider uses a sterile technique to access the port. ? Your health care provider must put on a mask and sterile gloves. ? The skin over your port is cleaned carefully with an antiseptic and allowed to dry. ? The port is gently pinched between sterile gloves, and a needle is inserted into the port.  Only "non-coring" port needles should be used to access the port. Once the port is accessed, a blood return should be checked. This helps ensure that the port  is in the vein and is not clogged.  If your port needs to remain accessed for a constant infusion, a clear (transparent) bandage will be placed over the needle site. The bandage and needle will need to be changed every week, or as directed by your health care provider.  Keep the bandage covering the needle clean and dry. Do not get it wet. Follow your health care provider's instructions on how to take a shower or bath while the port is accessed.  If your port does not need to stay accessed, no bandage is needed over the port.  What is flushing? Flushing helps keep the port from getting clogged. Follow your health care provider's instructions on how and when to flush the port. Ports are usually flushed with saline solution or a medicine called heparin. The need for flushing will depend on how the port is used.  If the port is used for intermittent medicines or blood draws, the port will need to be flushed: ? After medicines have been given. ? After blood has been drawn. ? As part of routine maintenance.  If a constant infusion is running, the port may not need to be flushed.  How long will my port stay implanted? The port can stay in for as long as your health care provider thinks it is needed. When it is time for the port to come out, surgery will be   done to remove it. The procedure is similar to the one performed when the port was put in. When should I seek immediate medical care? When you have an implanted port, you should seek immediate medical care if:  You notice a bad smell coming from the incision site.  You have swelling, redness, or drainage at the incision site.  You have more swelling or pain at the port site or the surrounding area.  You have a fever that is not controlled with medicine.  This information is not intended to replace advice given to you by your health care provider. Make sure you discuss any questions you have with your health care provider. Document  Released: 09/10/2005 Document Revised: 02/16/2016 Document Reviewed: 05/18/2013 Elsevier Interactive Patient Education  2017 Elsevier Inc.  

## 2016-11-28 NOTE — Telephone Encounter (Signed)
Gave patient avs report and appointments for March  °

## 2016-11-30 ENCOUNTER — Other Ambulatory Visit: Payer: Self-pay | Admitting: *Deleted

## 2016-11-30 DIAGNOSIS — C160 Malignant neoplasm of cardia: Secondary | ICD-10-CM

## 2016-11-30 MED ORDER — CAPECITABINE 500 MG PO TABS: ORAL_TABLET | ORAL | 0 refills | Status: DC

## 2016-11-30 NOTE — Telephone Encounter (Signed)
Called into [facility] spoke [place], pharmacist to refill Compazine, and cancelled the Xeloda sent in error to this pharmacy. New Xeloda prescription sent to [facility]

## 2016-12-11 ENCOUNTER — Other Ambulatory Visit: Payer: Self-pay | Admitting: Internal Medicine

## 2016-12-11 ENCOUNTER — Other Ambulatory Visit: Payer: BLUE CROSS/BLUE SHIELD

## 2016-12-11 DIAGNOSIS — R51 Headache: Principal | ICD-10-CM

## 2016-12-11 DIAGNOSIS — R519 Headache, unspecified: Secondary | ICD-10-CM

## 2016-12-19 ENCOUNTER — Other Ambulatory Visit: Payer: Self-pay | Admitting: *Deleted

## 2016-12-19 ENCOUNTER — Telehealth: Payer: Self-pay | Admitting: Oncology

## 2016-12-19 ENCOUNTER — Other Ambulatory Visit (HOSPITAL_BASED_OUTPATIENT_CLINIC_OR_DEPARTMENT_OTHER): Payer: BLUE CROSS/BLUE SHIELD

## 2016-12-19 ENCOUNTER — Ambulatory Visit (HOSPITAL_BASED_OUTPATIENT_CLINIC_OR_DEPARTMENT_OTHER): Payer: BLUE CROSS/BLUE SHIELD

## 2016-12-19 ENCOUNTER — Ambulatory Visit (HOSPITAL_BASED_OUTPATIENT_CLINIC_OR_DEPARTMENT_OTHER): Payer: BLUE CROSS/BLUE SHIELD | Admitting: Oncology

## 2016-12-19 VITALS — BP 120/81 | HR 124 | Temp 98.7°F | Resp 18 | Ht 66.0 in | Wt 169.1 lb

## 2016-12-19 DIAGNOSIS — C160 Malignant neoplasm of cardia: Secondary | ICD-10-CM | POA: Diagnosis not present

## 2016-12-19 DIAGNOSIS — C770 Secondary and unspecified malignant neoplasm of lymph nodes of head, face and neck: Secondary | ICD-10-CM

## 2016-12-19 DIAGNOSIS — E785 Hyperlipidemia, unspecified: Secondary | ICD-10-CM | POA: Diagnosis not present

## 2016-12-19 DIAGNOSIS — I10 Essential (primary) hypertension: Secondary | ICD-10-CM | POA: Diagnosis not present

## 2016-12-19 DIAGNOSIS — E119 Type 2 diabetes mellitus without complications: Secondary | ICD-10-CM | POA: Diagnosis not present

## 2016-12-19 DIAGNOSIS — R490 Dysphonia: Secondary | ICD-10-CM | POA: Diagnosis not present

## 2016-12-19 DIAGNOSIS — M109 Gout, unspecified: Secondary | ICD-10-CM

## 2016-12-19 DIAGNOSIS — G620 Drug-induced polyneuropathy: Secondary | ICD-10-CM | POA: Diagnosis not present

## 2016-12-19 DIAGNOSIS — Z95828 Presence of other vascular implants and grafts: Secondary | ICD-10-CM

## 2016-12-19 LAB — CBC WITH DIFFERENTIAL/PLATELET
BASO%: 0.5 % (ref 0.0–2.0)
Basophils Absolute: 0.1 10*3/uL (ref 0.0–0.1)
EOS%: 5.7 % (ref 0.0–7.0)
Eosinophils Absolute: 0.7 10*3/uL — ABNORMAL HIGH (ref 0.0–0.5)
HEMATOCRIT: 40.8 % (ref 38.4–49.9)
HEMOGLOBIN: 14.7 g/dL (ref 13.0–17.1)
LYMPH#: 1.9 10*3/uL (ref 0.9–3.3)
LYMPH%: 16.9 % (ref 14.0–49.0)
MCH: 32 pg (ref 27.2–33.4)
MCHC: 36 g/dL (ref 32.0–36.0)
MCV: 88.7 fL (ref 79.3–98.0)
MONO#: 1.1 10*3/uL — ABNORMAL HIGH (ref 0.1–0.9)
MONO%: 9.6 % (ref 0.0–14.0)
NEUT#: 7.8 10*3/uL — ABNORMAL HIGH (ref 1.5–6.5)
NEUT%: 67.3 % (ref 39.0–75.0)
Platelets: 268 10*3/uL (ref 140–400)
RBC: 4.6 10*6/uL (ref 4.20–5.82)
RDW: 14.8 % — AB (ref 11.0–14.6)
WBC: 11.5 10*3/uL — AB (ref 4.0–10.3)

## 2016-12-19 LAB — COMPREHENSIVE METABOLIC PANEL
ALT: 32 U/L (ref 0–55)
AST: 21 U/L (ref 5–34)
Albumin: 4.5 g/dL (ref 3.5–5.0)
Alkaline Phosphatase: 106 U/L (ref 40–150)
Anion Gap: 12 mEq/L — ABNORMAL HIGH (ref 3–11)
BUN: 18.9 mg/dL (ref 7.0–26.0)
CALCIUM: 11.2 mg/dL — AB (ref 8.4–10.4)
CHLORIDE: 108 meq/L (ref 98–109)
CO2: 21 mEq/L — ABNORMAL LOW (ref 22–29)
Creatinine: 0.9 mg/dL (ref 0.7–1.3)
EGFR: >90 mL/min/{1.73_m2} (ref >90)
GLUCOSE: 179 mg/dL — AB (ref 70–140)
Potassium: 4.3 mEq/L (ref 3.5–5.1)
SODIUM: 140 meq/L (ref 136–145)
Total Bilirubin: 1.05 mg/dL (ref 0.20–1.20)
Total Protein: 8 g/dL (ref 6.4–8.3)

## 2016-12-19 MED ORDER — SODIUM CHLORIDE 0.9% FLUSH
10.0000 mL | INTRAVENOUS | Status: DC | PRN
  Administered 2016-12-19: 10 mL via INTRAVENOUS
  Filled 2016-12-19: qty 10

## 2016-12-19 MED ORDER — CAPECITABINE 500 MG PO TABS: ORAL_TABLET | ORAL | 0 refills | Status: DC

## 2016-12-19 MED ORDER — HEPARIN SOD (PORK) LOCK FLUSH 100 UNIT/ML IV SOLN
500.0000 [IU] | Freq: Once | INTRAVENOUS | Status: AC | PRN
  Administered 2016-12-19: 500 [IU] via INTRAVENOUS
  Filled 2016-12-19: qty 5

## 2016-12-19 NOTE — Progress Notes (Signed)
[facility] OFFICE PROGRESS NOTE   Diagnosis: Gastric cancer  INTERVAL HISTORY:   Robert Beck returns as scheduled. He is taking maintenance Xeloda. He began the last cycle on 12/15/2016. No diarrhea or hand/foot pain. The neuropathy symptoms have improved. No chest pain. No dysphagia. He reports hoarseness for the past week. He had headaches prior to the onset of hoarseness and reports Dr. Virgina Jock obtained a head CT that was negative. He has a sore throat.  Objective:  Vital signs in last 24 hours:  Blood pressure 120/81, pulse (!) 124, temperature 98.7 F (37.1 C), temperature source Oral, resp. rate 18, height 5\' 6"  (1.676 m), weight 169 lb 1.6 oz (76.7 kg), SpO2 98 %. Manual pulse-108    HEENT: No thrush or ulcers, pharynx without erythema or exudate Lymphatics: No cervical or supraclavicular nodes Resp: Lungs clear bilaterally Cardio: Regular rate and rhythm GI: No hepatosplenomegaly, no mass, nontender Vascular: No leg edema    Portacath/PICC-without erythema  Lab Results:  Lab Results  Component Value Date   WBC 11.5 (H) 12/19/2016   HGB 14.7 12/19/2016   HCT 40.8 12/19/2016   MCV 88.7 12/19/2016   PLT 268 12/19/2016   NEUTROABS 7.8 (H) 12/19/2016     Medications: I have reviewed the patient's current medications.  Assessment/Plan: 1. 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 Decreased gastric cardia wall thickening.  Cycle 7 FOLFOX 07/25/2016  Cycle 8 FOLFOX 08/08/2016  Cycle 9 FOLFOX 08/22/2016 (oxaliplatin held due to neuropathy)  Cycle 10 FOLFOX 09/05/2016 (oxaliplatin held)  Cycle 11 FOLFOX 09/26/2016 (oxaliplatin held)  Cycle 12 FOLFOX 10/10/2016 (oxaliplatin held)  CT 10/18/2016-mild enlargement of mediastinal lymph nodes, no other evidence of disease progression, stable thickening at the GE junction and stable gastrohepatic lymphadenopathy  Cycle 13 FOLFOX 10/24/2016 (oxaliplatin held)  Maintenance Xeloda 7 days on/7 days off beginning 11/17/2016  2.   Anemia secondary to #1-improved  3. Diabetes  4. Gout  5. Hypertension  6. Hyperlipidemia  7. History of a T7 compression fracture  8. Pain secondary to the gastric mass and mediastinal lymphadenopathy-resolved  9. Port-A-Cath placement 04/20/2016  10. 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 1 mm obstructing stone distal right ureter/right ureteral vesicle junction on CT 09/13/2016.  13. Hoarseness 12/19/2016-potentially related to mediastinal adenopathy-ENT referral made  14. Hypercalcemia 12/19/2016-asymptomatic   Disposition:  Robert Beck is tolerating the Xeloda well. He has developed hoarseness over the past week. I am concerned this could be related to vocal cord paralysis. I will refer him to [name].  The calcium is mildly elevated. He will contact us for symptoms of hypercalcemia. We will repeat a calcium level when he returns in 2 weeks.  He will continue Xeloda on a 7 day on/7 day off schedule. We will schedule a chest CT if he is found to have vocal cord paralysis.  25 minutes were spent with the patient today.  The majority of the time was used for counseling and  coordination of care.  Betsy Coder, MD  12/19/2016  11:09 AM

## 2016-12-19 NOTE — Telephone Encounter (Signed)
Gave patient AVS and calender per 12/19/2016 los. Referral for [name] sent  Out.

## 2016-12-19 NOTE — Patient Instructions (Signed)
Implanted Port Home Guide An implanted port is a type of central line that is placed under the skin. Central lines are used to provide IV access when treatment or nutrition needs to be given through a person's veins. Implanted ports are used for long-term IV access. An implanted port may be placed because:  You need IV medicine that would be irritating to the small veins in your hands or arms.  You need long-term IV medicines, such as antibiotics.  You need IV nutrition for a long period.  You need frequent blood draws for lab tests.  You need dialysis.  Implanted ports are usually placed in the chest area, but they can also be placed in the upper arm, the abdomen, or the leg. An implanted port has two main parts:  Reservoir. The reservoir is round and will appear as a small, raised area under your skin. The reservoir is the part where a needle is inserted to give medicines or draw blood.  Catheter. The catheter is a thin, flexible tube that extends from the reservoir. The catheter is placed into a large vein. Medicine that is inserted into the reservoir goes into the catheter and then into the vein.  How will I care for my incision site? Do not get the incision site wet. Bathe or shower as directed by your health care provider. How is my port accessed? Special steps must be taken to access the port:  Before the port is accessed, a numbing cream can be placed on the skin. This helps numb the skin over the port site.  Your health care provider uses a sterile technique to access the port. ? Your health care provider must put on a mask and sterile gloves. ? The skin over your port is cleaned carefully with an antiseptic and allowed to dry. ? The port is gently pinched between sterile gloves, and a needle is inserted into the port.  Only "non-coring" port needles should be used to access the port. Once the port is accessed, a blood return should be checked. This helps ensure that the port  is in the vein and is not clogged.  If your port needs to remain accessed for a constant infusion, a clear (transparent) bandage will be placed over the needle site. The bandage and needle will need to be changed every week, or as directed by your health care provider.  Keep the bandage covering the needle clean and dry. Do not get it wet. Follow your health care provider's instructions on how to take a shower or bath while the port is accessed.  If your port does not need to stay accessed, no bandage is needed over the port.  What is flushing? Flushing helps keep the port from getting clogged. Follow your health care provider's instructions on how and when to flush the port. Ports are usually flushed with saline solution or a medicine called heparin. The need for flushing will depend on how the port is used.  If the port is used for intermittent medicines or blood draws, the port will need to be flushed: ? After medicines have been given. ? After blood has been drawn. ? As part of routine maintenance.  If a constant infusion is running, the port may not need to be flushed.  How long will my port stay implanted? The port can stay in for as long as your health care provider thinks it is needed. When it is time for the port to come out, surgery will be   done to remove it. The procedure is similar to the one performed when the port was put in. When should I seek immediate medical care? When you have an implanted port, you should seek immediate medical care if:  You notice a bad smell coming from the incision site.  You have swelling, redness, or drainage at the incision site.  You have more swelling or pain at the port site or the surrounding area.  You have a fever that is not controlled with medicine.  This information is not intended to replace advice given to you by your health care provider. Make sure you discuss any questions you have with your health care provider. Document  Released: 09/10/2005 Document Revised: 02/16/2016 Document Reviewed: 05/18/2013 Elsevier Interactive Patient Education  2017 Elsevier Inc.  

## 2016-12-26 ENCOUNTER — Telehealth: Payer: Self-pay | Admitting: *Deleted

## 2016-12-26 ENCOUNTER — Telehealth: Payer: Self-pay | Admitting: Oncology

## 2016-12-26 NOTE — Telephone Encounter (Signed)
Spoke to [place] at Dr. Janeice Robinson office- she will contact patient for more insurance info and schedule patient from there. Information given to Maudie Mercury in HIM to fax over.

## 2016-12-26 NOTE — Telephone Encounter (Signed)
Message received from patient's girlfriend stating that patient is continuing to have speech and swallowing difficulties and would like appt with Dr. Elie Goody today or tomorrow.  Referral placed by Dr. Benay Spice on 12/19/16.  Message sent to schedulers to place referral for today or tomorrow.  Call placed back to patient's girlfriend and she confirms that pt has an appt for tomorrow, 12/27/16 to see [name].  Patient's girlfriend appreciative of call back.

## 2016-12-26 NOTE — Telephone Encounter (Signed)
Faxed records to [name]

## 2017-01-03 ENCOUNTER — Other Ambulatory Visit: Payer: BLUE CROSS/BLUE SHIELD

## 2017-01-03 ENCOUNTER — Telehealth: Payer: Self-pay | Admitting: Oncology

## 2017-01-03 ENCOUNTER — Other Ambulatory Visit: Payer: Self-pay | Admitting: *Deleted

## 2017-01-03 ENCOUNTER — Ambulatory Visit (HOSPITAL_BASED_OUTPATIENT_CLINIC_OR_DEPARTMENT_OTHER): Payer: BLUE CROSS/BLUE SHIELD | Admitting: Oncology

## 2017-01-03 VITALS — BP 158/98 | HR 103 | Temp 98.2°F | Resp 18 | Ht 66.0 in | Wt 170.8 lb

## 2017-01-03 DIAGNOSIS — E119 Type 2 diabetes mellitus without complications: Secondary | ICD-10-CM | POA: Diagnosis not present

## 2017-01-03 DIAGNOSIS — E785 Hyperlipidemia, unspecified: Secondary | ICD-10-CM | POA: Diagnosis not present

## 2017-01-03 DIAGNOSIS — R490 Dysphonia: Secondary | ICD-10-CM

## 2017-01-03 DIAGNOSIS — C160 Malignant neoplasm of cardia: Secondary | ICD-10-CM

## 2017-01-03 DIAGNOSIS — R1310 Dysphagia, unspecified: Secondary | ICD-10-CM | POA: Diagnosis not present

## 2017-01-03 DIAGNOSIS — M109 Gout, unspecified: Secondary | ICD-10-CM

## 2017-01-03 DIAGNOSIS — G620 Drug-induced polyneuropathy: Secondary | ICD-10-CM

## 2017-01-03 DIAGNOSIS — I10 Essential (primary) hypertension: Secondary | ICD-10-CM

## 2017-01-03 DIAGNOSIS — C770 Secondary and unspecified malignant neoplasm of lymph nodes of head, face and neck: Secondary | ICD-10-CM | POA: Diagnosis not present

## 2017-01-03 LAB — CBC WITH DIFFERENTIAL/PLATELET
BASO%: 1 % (ref 0.0–2.0)
BASOS ABS: 0.1 10*3/uL (ref 0.0–0.1)
EOS%: 2.8 % (ref 0.0–7.0)
Eosinophils Absolute: 0.3 10*3/uL (ref 0.0–0.5)
HEMATOCRIT: 40.7 % (ref 38.4–49.9)
HEMOGLOBIN: 14.5 g/dL (ref 13.0–17.1)
LYMPH#: 3.2 10*3/uL (ref 0.9–3.3)
LYMPH%: 31.5 % (ref 14.0–49.0)
MCH: 32.4 pg (ref 27.2–33.4)
MCHC: 35.7 g/dL (ref 32.0–36.0)
MCV: 90.6 fL (ref 79.3–98.0)
MONO#: 0.8 10*3/uL (ref 0.1–0.9)
MONO%: 8 % (ref 0.0–14.0)
NEUT#: 5.7 10*3/uL (ref 1.5–6.5)
NEUT%: 56.7 % (ref 39.0–75.0)
PLATELETS: 305 10*3/uL (ref 140–400)
RBC: 4.49 10*6/uL (ref 4.20–5.82)
RDW: 15.9 % — ABNORMAL HIGH (ref 11.0–14.6)
WBC: 10 10*3/uL (ref 4.0–10.3)

## 2017-01-03 LAB — COMPREHENSIVE METABOLIC PANEL
ALT: 33 U/L (ref 0–55)
ANION GAP: 12 meq/L — AB (ref 3–11)
AST: 21 U/L (ref 5–34)
Albumin: 4.4 g/dL (ref 3.5–5.0)
Alkaline Phosphatase: 104 U/L (ref 40–150)
BUN: 11.9 mg/dL (ref 7.0–26.0)
CHLORIDE: 108 meq/L (ref 98–109)
CO2: 24 meq/L (ref 22–29)
Calcium: 10.3 mg/dL (ref 8.4–10.4)
Creatinine: 0.9 mg/dL (ref 0.7–1.3)
EGFR: >90 mL/min/{1.73_m2} (ref >90)
Glucose: 83 mg/dl (ref 70–140)
POTASSIUM: 3.7 meq/L (ref 3.5–5.1)
Sodium: 144 mEq/L (ref 136–145)
Total Bilirubin: 0.7 mg/dL (ref 0.20–1.20)
Total Protein: 7.9 g/dL (ref 6.4–8.3)

## 2017-01-03 MED ORDER — POTASSIUM CHLORIDE ER 10 MEQ PO CPCR: 10.0000 meq | ORAL_CAPSULE | Freq: Two times a day (BID) | ORAL | 0 refills | Status: DC

## 2017-01-03 NOTE — Progress Notes (Signed)
  [facility] OFFICE PROGRESS NOTE   Diagnosis: Gastric cancer  INTERVAL HISTORY:   Robert Beck returns as scheduled. He began another week of Xeloda on 12/29/2016. No mouth sores, diarrhea, or hand/foot pain. He has increased difficulty with solid dysphagia. He has difficulty swallowing the large potassium pills. No problem with liquids. He continues to have hoarseness. He saw [name] and was confirmed to have left vocal cord paralysis. He plans to follow-up with [name] if the hoarseness worsens or he develops more difficulty swallowing. Stable neuropathy symptoms at the hands and feet. Objective:  Vital signs in last 24 hours:  Blood pressure (!) 158/98, pulse (!) 103, temperature 98.2 F (36.8 C), temperature source Oral, resp. rate 18, height 5\' 6"  (1.676 m), weight 170 lb 12.8 oz (77.5 kg), SpO2 99 %.    HEENT: No thrush or ulcers Lymphatics: No cervical or supraclavicular nodes Resp: Lungs clear bilaterally Cardio: Regular rate and rhythm GI: No hepatosplenomegaly, nontender Vascular: No leg edema  Skin: Palms without erythema   Portacath/PICC-without erythema  Lab Results:  Lab Results  Component Value Date   WBC 10.0 01/03/2017   HGB 14.5 01/03/2017   HCT 40.7 01/03/2017   MCV 90.6 01/03/2017   PLT 305 01/03/2017   NEUTROABS 5.7 01/03/2017    Medications: I have reviewed the patient's current medications.  Assessment/Plan: 1. 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 Decreased gastric cardia wall thickening.  Cycle 7 FOLFOX 07/25/2016  Cycle 8 FOLFOX 08/08/2016  Cycle 9 FOLFOX 08/22/2016 (oxaliplatin held due to neuropathy)  Cycle 10 FOLFOX 09/05/2016 (oxaliplatin held)  Cycle 11 FOLFOX 09/26/2016 (oxaliplatin held)  Cycle 12 FOLFOX 10/10/2016 (oxaliplatin held)  CT 10/18/2016-mild enlargement of mediastinal lymph nodes, no other evidence of disease progression, stable thickening at the GE junction and stable gastrohepatic lymphadenopathy  Cycle 13 FOLFOX 10/24/2016 (oxaliplatin held)  Maintenance Xeloda 7 days on/7 days off beginning 11/17/2016  2.   Anemia secondary to #1-improved  3. Diabetes  4. Gout  5. Hypertension  6. Hyperlipidemia  7. History of a T7 compression fracture  8. Pain secondary to the gastric mass and mediastinal lymphadenopathy-resolved  9. Port-A-Cath placement 04/20/2016  10. 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 1 mm obstructing stone distal right ureter/right ureteral vesicle junction on CT 09/13/2016.  13. 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 Hypercalcemia 12/19/2016-asymptomatic   Disposition:  Robert Beck continues maintenance Xeloda. He will follow-up with [name] for increased dysphagia and consider a vocal cord procedure. He will undergo a restaging chest CT prior to a return visit on 01/24/2017.  We will switch the potassium to a capsule preparation.  Betsy Coder, MD  01/03/2017  8:50 AM

## 2017-01-03 NOTE — Telephone Encounter (Signed)
Gave patient AVS and calender per 4/12 los. [facility] to contact patient with ct.

## 2017-01-10 ENCOUNTER — Other Ambulatory Visit: Payer: Self-pay | Admitting: *Deleted

## 2017-01-10 ENCOUNTER — Telehealth: Payer: Self-pay | Admitting: *Deleted

## 2017-01-10 NOTE — Telephone Encounter (Signed)
Message received from [facility] requesting date of CT scan.  Message sent to N. Singleton to complete PA.  PA complete and central scheduling notified to call pt to schedule CT.

## 2017-01-17 ENCOUNTER — Other Ambulatory Visit: Payer: Self-pay | Admitting: Medical Oncology

## 2017-01-17 DIAGNOSIS — C160 Malignant neoplasm of cardia: Secondary | ICD-10-CM

## 2017-01-17 MED ORDER — CAPECITABINE 500 MG PO TABS: ORAL_TABLET | ORAL | 0 refills | Status: DC

## 2017-01-22 ENCOUNTER — Encounter (HOSPITAL_COMMUNITY): Payer: Self-pay

## 2017-01-22 ENCOUNTER — Ambulatory Visit (HOSPITAL_BASED_OUTPATIENT_CLINIC_OR_DEPARTMENT_OTHER): Payer: BLUE CROSS/BLUE SHIELD

## 2017-01-22 ENCOUNTER — Ambulatory Visit (HOSPITAL_COMMUNITY)
Admission: RE | Admit: 2017-01-22 | Discharge: 2017-01-22 | Disposition: A | Discharge status: Home / Self Care | Payer: BLUE CROSS/BLUE SHIELD | Source: Ambulatory Visit | Attending: Oncology | Admitting: Oncology

## 2017-01-22 ENCOUNTER — Other Ambulatory Visit (HOSPITAL_BASED_OUTPATIENT_CLINIC_OR_DEPARTMENT_OTHER): Payer: BLUE CROSS/BLUE SHIELD

## 2017-01-22 DIAGNOSIS — Z95828 Presence of other vascular implants and grafts: Secondary | ICD-10-CM

## 2017-01-22 DIAGNOSIS — C771 Secondary and unspecified malignant neoplasm of intrathoracic lymph nodes: Secondary | ICD-10-CM | POA: Insufficient documentation

## 2017-01-22 DIAGNOSIS — I700 Atherosclerosis of aorta: Secondary | ICD-10-CM | POA: Diagnosis not present

## 2017-01-22 DIAGNOSIS — C770 Secondary and unspecified malignant neoplasm of lymph nodes of head, face and neck: Secondary | ICD-10-CM | POA: Diagnosis not present

## 2017-01-22 DIAGNOSIS — C160 Malignant neoplasm of cardia: Secondary | ICD-10-CM

## 2017-01-22 HISTORY — DX: Malignant (primary) neoplasm, unspecified: C80.1

## 2017-01-22 LAB — CBC WITH DIFFERENTIAL/PLATELET
BASO%: 0.3 % (ref 0.0–2.0)
Basophils Absolute: 0 10*3/uL (ref 0.0–0.1)
EOS ABS: 0.2 10*3/uL (ref 0.0–0.5)
EOS%: 2.7 % (ref 0.0–7.0)
HEMATOCRIT: 36.5 % — AB (ref 38.4–49.9)
HGB: 13.1 g/dL (ref 13.0–17.1)
LYMPH#: 2.1 10*3/uL (ref 0.9–3.3)
LYMPH%: 24.1 % (ref 14.0–49.0)
MCH: 32.3 pg (ref 27.2–33.4)
MCHC: 35.9 g/dL (ref 32.0–36.0)
MCV: 89.9 fL (ref 79.3–98.0)
MONO#: 0.6 10*3/uL (ref 0.1–0.9)
MONO%: 7.2 % (ref 0.0–14.0)
NEUT%: 65.7 % (ref 39.0–75.0)
NEUTROS ABS: 5.8 10*3/uL (ref 1.5–6.5)
PLATELETS: 225 10*3/uL (ref 140–400)
RBC: 4.06 10*6/uL — AB (ref 4.20–5.82)
RDW: 15.6 % — ABNORMAL HIGH (ref 11.0–14.6)
WBC: 8.9 10*3/uL (ref 4.0–10.3)

## 2017-01-22 LAB — COMPREHENSIVE METABOLIC PANEL
ALK PHOS: 95 U/L (ref 40–150)
ALT: 32 U/L (ref 0–55)
ANION GAP: 10 meq/L (ref 3–11)
AST: 22 U/L (ref 5–34)
Albumin: 4.3 g/dL (ref 3.5–5.0)
BILIRUBIN TOTAL: 0.51 mg/dL (ref 0.20–1.20)
BUN: 12 mg/dL (ref 7.0–26.0)
CALCIUM: 9.9 mg/dL (ref 8.4–10.4)
CO2: 24 meq/L (ref 22–29)
Chloride: 108 mEq/L (ref 98–109)
Creatinine: 0.8 mg/dL (ref 0.7–1.3)
Glucose: 97 mg/dl (ref 70–140)
Potassium: 3.7 mEq/L (ref 3.5–5.1)
Sodium: 142 mEq/L (ref 136–145)
TOTAL PROTEIN: 7.4 g/dL (ref 6.4–8.3)

## 2017-01-22 MED ORDER — SODIUM CHLORIDE 0.9% FLUSH
10.0000 mL | INTRAVENOUS | Status: DC | PRN
  Administered 2017-01-22: 10 mL via INTRAVENOUS
  Filled 2017-01-22: qty 10

## 2017-01-22 MED ORDER — IOPAMIDOL (ISOVUE-300) INJECTION 61%
75.0000 mL | Freq: Once | INTRAVENOUS | Status: AC | PRN
  Administered 2017-01-22: 75 mL via INTRAVENOUS

## 2017-01-22 MED ORDER — HEPARIN SOD (PORK) LOCK FLUSH 100 UNIT/ML IV SOLN
INTRAVENOUS | Status: AC
  Filled 2017-01-22: qty 5

## 2017-01-22 MED ORDER — IOPAMIDOL (ISOVUE-300) INJECTION 61%
INTRAVENOUS | Status: AC
  Filled 2017-01-22: qty 75

## 2017-01-22 MED ORDER — HEPARIN SOD (PORK) LOCK FLUSH 100 UNIT/ML IV SOLN
500.0000 [IU] | Freq: Once | INTRAVENOUS | Status: DC | PRN
  Filled 2017-01-22: qty 5

## 2017-01-22 MED ORDER — HEPARIN SOD (PORK) LOCK FLUSH 100 UNIT/ML IV SOLN
500.0000 [IU] | Freq: Once | INTRAVENOUS | Status: AC
  Administered 2017-01-22: 500 [IU] via INTRAVENOUS

## 2017-01-24 ENCOUNTER — Telehealth: Payer: Self-pay | Admitting: Nurse Practitioner

## 2017-01-24 ENCOUNTER — Ambulatory Visit (HOSPITAL_BASED_OUTPATIENT_CLINIC_OR_DEPARTMENT_OTHER): Payer: BLUE CROSS/BLUE SHIELD | Admitting: Nurse Practitioner

## 2017-01-24 ENCOUNTER — Ambulatory Visit (HOSPITAL_BASED_OUTPATIENT_CLINIC_OR_DEPARTMENT_OTHER): Payer: BLUE CROSS/BLUE SHIELD

## 2017-01-24 VITALS — BP 134/97 | HR 105 | Temp 98.6°F | Resp 18 | Ht 66.0 in | Wt 171.1 lb

## 2017-01-24 DIAGNOSIS — J3800 Paralysis of vocal cords and larynx, unspecified: Secondary | ICD-10-CM | POA: Diagnosis not present

## 2017-01-24 DIAGNOSIS — C160 Malignant neoplasm of cardia: Secondary | ICD-10-CM

## 2017-01-24 DIAGNOSIS — I10 Essential (primary) hypertension: Secondary | ICD-10-CM | POA: Diagnosis not present

## 2017-01-24 DIAGNOSIS — R490 Dysphonia: Secondary | ICD-10-CM | POA: Diagnosis not present

## 2017-01-24 DIAGNOSIS — C770 Secondary and unspecified malignant neoplasm of lymph nodes of head, face and neck: Secondary | ICD-10-CM | POA: Diagnosis not present

## 2017-01-24 DIAGNOSIS — E119 Type 2 diabetes mellitus without complications: Secondary | ICD-10-CM | POA: Diagnosis not present

## 2017-01-24 DIAGNOSIS — G620 Drug-induced polyneuropathy: Secondary | ICD-10-CM

## 2017-01-24 DIAGNOSIS — D630 Anemia in neoplastic disease: Secondary | ICD-10-CM

## 2017-01-24 LAB — COMPREHENSIVE METABOLIC PANEL
ALBUMIN: 4.3 g/dL (ref 3.5–5.0)
ALK PHOS: 90 U/L (ref 40–150)
ALT: 33 U/L (ref 0–55)
ANION GAP: 12 meq/L — AB (ref 3–11)
AST: 23 U/L (ref 5–34)
BILIRUBIN TOTAL: 0.61 mg/dL (ref 0.20–1.20)
BUN: 11.2 mg/dL (ref 7.0–26.0)
CALCIUM: 10.2 mg/dL (ref 8.4–10.4)
CO2: 23 mEq/L (ref 22–29)
Chloride: 105 mEq/L (ref 98–109)
Creatinine: 0.9 mg/dL (ref 0.7–1.3)
GLUCOSE: 222 mg/dL — AB (ref 70–140)
POTASSIUM: 3.7 meq/L (ref 3.5–5.1)
Sodium: 140 mEq/L (ref 136–145)
TOTAL PROTEIN: 7.5 g/dL (ref 6.4–8.3)

## 2017-01-24 NOTE — Telephone Encounter (Signed)
Appointments scheduled per 5.3.18 LOS. Patient given AVS report and calendars with future scheduled appointments. °

## 2017-01-24 NOTE — Progress Notes (Addendum)
[facility] OFFICE PROGRESS NOTE   Diagnosis:  Gastric cancer  INTERVAL HISTORY:   Robert Beck returns as scheduled. He has persistent hoarseness. No dysphagia. No shortness of breath. He denies pain. He has persistent numbness in the hands and feet.  Objective:  Vital signs in last 24 hours:  Blood pressure (!) 134/97, pulse (!) 105, temperature 98.6 F (37 C), temperature source Oral, resp. rate 18, height _0  (1.676 m), weight 171 lb 1.6 oz (77.6 kg), SpO2 100 %.    HEENT: No thrush or ulcers. Lymphatics: No palpable cervical or supra-clavicular lymph nodes. Resp: Lungs clear bilaterally. Cardio: Regular rate and rhythm. GI: No hepatomegaly. Vascular: No leg edema. Port-A-Cath without erythema.  Lab Results:  Lab Results  Component Value Date   WBC 8.9 01/22/2017   HGB 13.1 01/22/2017   HCT 36.5 (L) 01/22/2017   MCV 89.9 01/22/2017   PLT 225 01/22/2017   NEUTROABS 5.8 01/22/2017    Imaging:  Ct Chest W Contrast  Result Date: 01/22/2017 CLINICAL DATA:  Gastric cancer.  New onset hoarseness. EXAM: CT CHEST WITH CONTRAST TECHNIQUE: Multidetector CT imaging of the chest was performed during intravenous contrast administration. CONTRAST:  72m ISOVUE-300 IOPAMIDOL (ISOVUE-300) INJECTION 61% COMPARISON:  10/18/2016 FINDINGS: Cardiovascular: The heart size is normal. No pericardial effusion. Coronary artery calcification is noted. Atherosclerotic calcification is noted in the wall of the thoracic aorta. Right Port-A-Cath tip is positioned in the distal SVC in the region of the RA junction. Mediastinum/Nodes: 11 mm prevascular lymph node seen on the previous study is progressed, measuring 17 mm short axis today. 14 mm short axis right paratracheal lymph node on the prior study is progressed, measuring 22 mm today. 16 mm short axis necrotic AP window lymph node on today's study was 8 mm short axis when I remeasure on the prior exam. There is no hilar  lymphadenopathy. The esophagus has normal imaging features. There is no axillary lymphadenopathy. Lungs/Pleura: 3 mm right upper lobe nodule (image 51 series 7) is unchanged in the interval. No suspicious pulmonary nodule or mass. No focal airspace consolidation. No pulmonary edema or pleural effusion. Upper Abdomen: 15 mm short axis gastrohepatic ligament lymph node measured on the prior study now measures 23 mm. Stable cyst upper pole left kidney. Musculoskeletal: Bone windows reveal no worrisome lytic or sclerotic osseous lesions. Compression deformity mid thoracic vertebral body is unchanged. IMPRESSION: 1. Interval progression of metastatic lymphadenopathy in the mediastinum and gastrohepatic ligament. 2. Coronary artery and thoracoabdominal aortic atherosclerosis. 3. Stable compression deformity mid thoracic vertebral body. Electronically Signed   By: EMisty StanleyM.D.   On: 01/22/2017 12:58    Medications: I have reviewed the patient's current medications.  Assessment/Plan: 1. 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 Decreased gastric cardia wall thickening.  Cycle 7 FOLFOX 07/25/2016  Cycle 8 FOLFOX 08/08/2016  Cycle 9 FOLFOX 08/22/2016 (oxaliplatin held due to neuropathy)  Cycle 10 FOLFOX 09/05/2016 (oxaliplatin held)  Cycle 11 FOLFOX  09/26/2016 (  oxaliplatin held)  Cycle 12 FOLFOX 10/10/2016 (oxaliplatin held)  CT 10/18/2016-mild enlargement of mediastinal lymph nodes, no other evidence of disease progression, stable thickening at the GE junction and stable gastrohepatic lymphadenopathy  Cycle 13 FOLFOX 10/24/2016 (oxaliplatin held)  Maintenance Xeloda 7 days on/7 days off beginning 11/17/2016  Restaging chest CT 01/22/2017-interval progression of metastatic lymphadenopathy in the mediastinum and gastrohepatic ligament.  2. Anemia secondary to #1-improved  3. Diabetes  4. Gout  5. Hypertension  6. Hyperlipidemia  7. History of a T7 compression fracture  8. Pain secondary to the gastric mass and mediastinal lymphadenopathy-resolved  9. Port-A-Cath placement 04/20/2016  10. 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 1 mm obstructing stone distal right ureter/right ureteral vesicle junction on CT 09/13/2016.  13. 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 Hypercalcemia 12/19/2016-asymptomatic    Disposition: Robert Beck appears stable. We reviewed the restaging CT results. He and his significant other understand there is evidence of disease progression. He will discontinue Xeloda. He will return to the lab today for foundation 1 testing off of peripheral blood. We will request HER-2/neu testing off of the supraclavicular lymph node biopsy. We made a referral to Robert Beck at [facility] to discuss treatment options, clinical trial availability. He will return for a follow-up visit here in 2 weeks for further discussion.  He has persistent hoarseness due to left vocal cord paralysis. He has seen ENT. At present he has decided against proceeding with a vocal cord procedure. If he changes his mind he will contact  Robert Beck.  He will contact the office prior to his next visit with any problems.  Patient seen with Dr. Benay Spice. 25 minutes were spent face-to-face at today's visit with the majority of that time involved in counseling/coordination of care.    Robert Beck ANP/GNP-BC   01/24/2017  9:34 AM This was a shared visit with Robert Beck.  The restaging CT reveals disease progression.  We discussed treatment options with Robert Beck.  We will refer him to Robert Beck to see if he is eligible for a trial. We will submit molecular testing.  Robert Manson, MD

## 2017-01-24 NOTE — Telephone Encounter (Signed)
Appointments scheduled per 5.3.18 LOS. Patient given AVS report and calendars with future scheduled appointments. Patient's wife stated that patient didn't need flush appointment.

## 2017-01-25 ENCOUNTER — Telehealth: Payer: Self-pay | Admitting: Oncology

## 2017-01-25 NOTE — Telephone Encounter (Signed)
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 I advised that I spoke with Lorriane Shire, Dr. Gearldine Shown nurse and after reviewing the forms we are not able to complete as [name] is not our patient. Asked Mrs. Bixler to call her employer and have them to fax correct FMLA form "Certification of [place] Provider for [facility] Serious Health Condition" which she stated that she would do

## 2017-01-28 ENCOUNTER — Telehealth: Payer: Self-pay | Admitting: *Deleted

## 2017-01-28 NOTE — Telephone Encounter (Signed)
Call from S/O to follow up on referral to [place]. Message to Maudie Mercury in HIM to contact Dr. Fanny Skates' office for appt.

## 2017-01-29 ENCOUNTER — Telehealth: Payer: Self-pay | Admitting: Oncology

## 2017-01-29 NOTE — Telephone Encounter (Signed)
Left message on voicemail informing pt of 5/18 office visit with Dr. Fanny Skates.

## 2017-01-29 NOTE — Telephone Encounter (Signed)
Pt appt with Dr. Fanny Skates is 02/08/17 @ 7:45. Called pt left vm in ref to appt. Faxed path reports

## 2017-02-07 ENCOUNTER — Other Ambulatory Visit: Payer: BLUE CROSS/BLUE SHIELD

## 2017-02-07 ENCOUNTER — Ambulatory Visit: Payer: BLUE CROSS/BLUE SHIELD | Admitting: Nurse Practitioner

## 2017-02-08 ENCOUNTER — Telehealth: Payer: Self-pay | Admitting: *Deleted

## 2017-02-08 NOTE — Telephone Encounter (Signed)
Left message informing pt he will not need lab for 5/21 appt. Check in at 2 for 2:15 appt.

## 2017-02-11 ENCOUNTER — Telehealth: Payer: Self-pay | Admitting: Oncology

## 2017-02-11 ENCOUNTER — Ambulatory Visit (HOSPITAL_BASED_OUTPATIENT_CLINIC_OR_DEPARTMENT_OTHER): Payer: BLUE CROSS/BLUE SHIELD | Admitting: Nurse Practitioner

## 2017-02-11 ENCOUNTER — Other Ambulatory Visit: Payer: BLUE CROSS/BLUE SHIELD

## 2017-02-11 VITALS — BP 124/75 | HR 100 | Temp 97.8°F | Resp 18 | Ht 66.0 in | Wt 172.4 lb

## 2017-02-11 DIAGNOSIS — R209 Unspecified disturbances of skin sensation: Secondary | ICD-10-CM

## 2017-02-11 DIAGNOSIS — R490 Dysphonia: Secondary | ICD-10-CM

## 2017-02-11 DIAGNOSIS — C160 Malignant neoplasm of cardia: Secondary | ICD-10-CM | POA: Diagnosis not present

## 2017-02-11 DIAGNOSIS — E785 Hyperlipidemia, unspecified: Secondary | ICD-10-CM

## 2017-02-11 DIAGNOSIS — E119 Type 2 diabetes mellitus without complications: Secondary | ICD-10-CM

## 2017-02-11 DIAGNOSIS — I10 Essential (primary) hypertension: Secondary | ICD-10-CM | POA: Diagnosis not present

## 2017-02-11 DIAGNOSIS — C770 Secondary and unspecified malignant neoplasm of lymph nodes of head, face and neck: Secondary | ICD-10-CM | POA: Diagnosis not present

## 2017-02-11 DIAGNOSIS — Z7189 Other specified counseling: Secondary | ICD-10-CM

## 2017-02-11 DIAGNOSIS — R110 Nausea: Secondary | ICD-10-CM | POA: Diagnosis not present

## 2017-02-11 DIAGNOSIS — M109 Gout, unspecified: Secondary | ICD-10-CM | POA: Diagnosis not present

## 2017-02-11 NOTE — Telephone Encounter (Signed)
Gave relative avs report and appointments for May and June.

## 2017-02-11 NOTE — Progress Notes (Signed)
START OFF PATHWAY REGIMEN - Gastroesophageal   [id number] (q14d)  **2 cycles per order sheet**:   A cycle is every 14 days:     Irinotecan      Leucovorin      5-Fluorouracil      5-Fluorouracil   **Always confirm dose/schedule in your pharmacy ordering system**    Patient Characteristics: Distant Metastases (cM1/pM1) / Locally Recurrent Disease, Adenocarcinoma - Esophageal, GE Junction, and Gastric, Second Line, MSS / pMMR or MSI Unknown Histology: Adenocarcinoma Disease Classification: Gastric Therapeutic Status: Distant Metastases (No Additional Staging) Would you be surprised if this patient died  in the next year? I would be surprised if this patient died in the next year Line of therapy: Second Line Microsatellite/Mismatch Repair Status: Unknown  Intent of Therapy: Non-Curative / Palliative Intent, Discussed with Patient

## 2017-02-11 NOTE — Progress Notes (Addendum)
[place] OFFICE PROGRESS NOTE   Diagnosis:  Gastric cancer  INTERVAL HISTORY:   Robert Beck returns as scheduled. He continues to note hoarseness. He has persistent numbness in the hands and feet. He has mild intermittent nausea. No vomiting. Bowels overall moving regularly. No dysphagia.  Objective:  Vital signs in last 24 hours:  Blood pressure 124/75, pulse 100, temperature 97.8 F (36.6 C), temperature source Oral, resp. rate 18, height '5\' 6"'  (1.676 m), weight 172 lb 6.4 oz (78.2 kg), SpO2 98 %.    HEENT: No thrush or ulcers.  Lymphatics: No palpable cervical or supraclavicular ymph nodes. Resp: Lungs clear bilaterally. Cardio: Regular rate and rhythm. GI: Abdomen soft and nontender. No hepatomegaly. Vascular: No leg edema. Port-A-Cath without erythema.   Lab Results:  Lab Results  Component Value Date   WBC 8.9 01/22/2017   HGB 13.1 01/22/2017   HCT 36.5 (L) 01/22/2017   MCV 89.9 01/22/2017   PLT 225 01/22/2017   NEUTROABS 5.8 01/22/2017    Imaging:  No results found.  Medications: I have reviewed the patient's current medications.  Assessment/Plan: 1. 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 Decreased gastric cardia wall thickening.  Cycle 7 FOLFOX 07/25/2016  Cycle 8 FOLFOX  08/08/2016  Cycle 9 FOLFOX 08/22/2016 (oxaliplatin held due to neuropathy)  Cycle 10 FOLFOX 09/05/2016 (oxaliplatin held)  Cycle 11 FOLFOX 09/26/2016 (oxaliplatin held)  Cycle 12 FOLFOX 10/10/2016 (oxaliplatin held)  CT 10/18/2016-mild enlargement of mediastinal lymph nodes, no other evidence of disease progression, stable thickening at the GE junction and stable gastrohepatic lymphadenopathy  Cycle 13 FOLFOX 10/24/2016 (oxaliplatin held)  Maintenance Xeloda 7 days on/7 days off beginning 11/17/2016  Restaging chest CT 01/22/2017-interval progression of metastatic lymphadenopathy in the mediastinum and gastrohepatic ligament.  2. Anemia secondary to #1-improved  3. Diabetes  4. Gout  5. Hypertension  6. Hyperlipidemia  7. History of a T7 compression fracture  8. Pain secondary to the gastric mass and mediastinal lymphadenopathy-resolved  9. Port-A-Cath placement 04/20/2016  10. 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 1 mm obstructing stone distal right ureter/right ureteral vesicle junction on CT 09/13/2016.  13. 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 Hypercalcemia 12/19/2016-asymptomatic   Disposition: Robert Beck appears unchanged. He saw Dr. Fanny Skates at [facility]. The recommendation is to begin treatment with FOLFIRI. We reviewed potential toxicities associated with irinotecan including diarrhea, possibly severe, and hematologic toxicity. We reviewed potential toxicities associated with 5-fluorouracil which he has had in the past and tolerated well. He is agreeable to proceed. He will return for cycle 1 on 02/20/2017. We will see him in follow-up prior to cycle 2 on 03/06/2017. He will contact the office in the interim with any problems.  Patient seen with Dr.  Benay Spice. 25 minutes were spent face-to-face at today's visit with the majority of that time involved in counseling/coordination of  care.    Robert Beck ANP/GNP-BC   02/11/2017  2:51 PM   This was a shared visit with Robert Beck. Dr.Uronis recommends treatment with FOLFIRI. We are waiting on molecular testing data. He understands we will need to consider a repeat biopsy for HER-2 and molecular testing if the peripheral blood testing is noninformative.  The plan is to begin FOLFIRI chemotherapy. We reviewed the potential toxicities associated with FOLFIRI and he agrees to proceed.  Robert Beck, M.D.

## 2017-02-18 ENCOUNTER — Other Ambulatory Visit: Payer: Self-pay | Admitting: Oncology

## 2017-02-20 ENCOUNTER — Ambulatory Visit: Payer: BLUE CROSS/BLUE SHIELD

## 2017-02-20 ENCOUNTER — Ambulatory Visit (HOSPITAL_BASED_OUTPATIENT_CLINIC_OR_DEPARTMENT_OTHER): Payer: BLUE CROSS/BLUE SHIELD

## 2017-02-20 ENCOUNTER — Other Ambulatory Visit (HOSPITAL_BASED_OUTPATIENT_CLINIC_OR_DEPARTMENT_OTHER): Payer: BLUE CROSS/BLUE SHIELD

## 2017-02-20 VITALS — BP 130/81 | HR 98 | Temp 98.7°F | Resp 16

## 2017-02-20 DIAGNOSIS — Z5111 Encounter for antineoplastic chemotherapy: Secondary | ICD-10-CM | POA: Diagnosis not present

## 2017-02-20 DIAGNOSIS — C160 Malignant neoplasm of cardia: Secondary | ICD-10-CM

## 2017-02-20 DIAGNOSIS — C770 Secondary and unspecified malignant neoplasm of lymph nodes of head, face and neck: Secondary | ICD-10-CM

## 2017-02-20 DIAGNOSIS — Z95828 Presence of other vascular implants and grafts: Secondary | ICD-10-CM

## 2017-02-20 LAB — CBC WITH DIFFERENTIAL/PLATELET
BASO%: 0.5 % (ref 0.0–2.0)
BASOS ABS: 0 10*3/uL (ref 0.0–0.1)
EOS ABS: 0.3 10*3/uL (ref 0.0–0.5)
EOS%: 3.9 % (ref 0.0–7.0)
HEMATOCRIT: 39 % (ref 38.4–49.9)
HEMOGLOBIN: 13.7 g/dL (ref 13.0–17.1)
LYMPH#: 1.8 10*3/uL (ref 0.9–3.3)
LYMPH%: 20.3 % (ref 14.0–49.0)
MCH: 31.2 pg (ref 27.2–33.4)
MCHC: 35.1 g/dL (ref 32.0–36.0)
MCV: 88.8 fL (ref 79.3–98.0)
MONO#: 0.7 10*3/uL (ref 0.1–0.9)
MONO%: 8.2 % (ref 0.0–14.0)
NEUT#: 5.9 10*3/uL (ref 1.5–6.5)
NEUT%: 67.1 % (ref 39.0–75.0)
Platelets: 251 10*3/uL (ref 140–400)
RBC: 4.39 10*6/uL (ref 4.20–5.82)
RDW: 13.3 % (ref 11.0–14.6)
WBC: 8.8 10*3/uL (ref 4.0–10.3)

## 2017-02-20 LAB — COMPREHENSIVE METABOLIC PANEL
ALBUMIN: 4 g/dL (ref 3.5–5.0)
ALK PHOS: 104 U/L (ref 40–150)
ALT: 35 U/L (ref 0–55)
AST: 21 U/L (ref 5–34)
Anion Gap: 11 mEq/L (ref 3–11)
BILIRUBIN TOTAL: 0.39 mg/dL (ref 0.20–1.20)
BUN: 11.1 mg/dL (ref 7.0–26.0)
CALCIUM: 10.3 mg/dL (ref 8.4–10.4)
CO2: 25 mEq/L (ref 22–29)
Chloride: 106 mEq/L (ref 98–109)
Creatinine: 0.9 mg/dL (ref 0.7–1.3)
EGFR: >90 mL/min/{1.73_m2} (ref >90)
Glucose: 200 mg/dl — ABNORMAL HIGH (ref 70–140)
POTASSIUM: 3.9 meq/L (ref 3.5–5.1)
Sodium: 141 mEq/L (ref 136–145)
Total Protein: 7.3 g/dL (ref 6.4–8.3)

## 2017-02-20 MED ORDER — DEXAMETHASONE SODIUM PHOSPHATE 10 MG/ML IJ SOLN
INTRAMUSCULAR | Status: AC
  Filled 2017-02-20: qty 1

## 2017-02-20 MED ORDER — ATROPINE SULFATE 1 MG/ML IJ SOLN
0.5000 mg | Freq: Once | INTRAMUSCULAR | Status: AC | PRN
  Administered 2017-02-20: 0.5 mg via INTRAVENOUS

## 2017-02-20 MED ORDER — PALONOSETRON HCL INJECTION 0.25 MG/5ML
0.2500 mg | Freq: Once | INTRAVENOUS | Status: AC
  Administered 2017-02-20: 0.25 mg via INTRAVENOUS

## 2017-02-20 MED ORDER — SODIUM CHLORIDE 0.9 % IV SOLN
180.0000 mg/m2 | Freq: Once | INTRAVENOUS | Status: AC
  Administered 2017-02-20: 340 mg via INTRAVENOUS
  Filled 2017-02-20: qty 15

## 2017-02-20 MED ORDER — SODIUM CHLORIDE 0.9 % IV SOLN
400.0000 mg/m2 | Freq: Once | INTRAVENOUS | Status: AC
  Administered 2017-02-20: 764 mg via INTRAVENOUS
  Filled 2017-02-20: qty 38.2

## 2017-02-20 MED ORDER — DEXAMETHASONE SODIUM PHOSPHATE 10 MG/ML IJ SOLN
10.0000 mg | Freq: Once | INTRAMUSCULAR | Status: AC
  Administered 2017-02-20: 10 mg via INTRAVENOUS

## 2017-02-20 MED ORDER — ATROPINE SULFATE 1 MG/ML IJ SOLN
INTRAMUSCULAR | Status: AC
  Filled 2017-02-20: qty 1

## 2017-02-20 MED ORDER — SODIUM CHLORIDE 0.9 % IV SOLN
Freq: Once | INTRAVENOUS | Status: AC
  Administered 2017-02-20: 13:00:00 via INTRAVENOUS

## 2017-02-20 MED ORDER — FLUOROURACIL CHEMO INJECTION 5 GM/100ML
2400.0000 mg/m2 | INTRAVENOUS | Status: DC
  Administered 2017-02-20: 4600 mg via INTRAVENOUS
  Filled 2017-02-20: qty 92

## 2017-02-20 MED ORDER — PALONOSETRON HCL INJECTION 0.25 MG/5ML
INTRAVENOUS | Status: AC
  Filled 2017-02-20: qty 5

## 2017-02-20 MED ORDER — SODIUM CHLORIDE 0.9% FLUSH
10.0000 mL | INTRAVENOUS | Status: DC | PRN
  Administered 2017-02-20: 10 mL via INTRAVENOUS
  Filled 2017-02-20: qty 10

## 2017-02-20 MED ORDER — FLUOROURACIL CHEMO INJECTION 2.5 GM/50ML
400.0000 mg/m2 | Freq: Once | INTRAVENOUS | Status: AC
  Administered 2017-02-20: 750 mg via INTRAVENOUS
  Filled 2017-02-20: qty 15

## 2017-02-20 NOTE — Patient Instructions (Signed)
[facility] Discharge Instructions for Patients Receiving Chemotherapy  Today you received the following chemotherapy agents irinotecan/leucovorin/florouracil   To help prevent nausea and vomiting after your treatment, we encourage you to take your nausea medication as directed  If you develop nausea and vomiting that is not controlled by your nausea medication, call the clinic.   BELOW ARE SYMPTOMS THAT SHOULD BE REPORTED IMMEDIATELY:  *FEVER GREATER THAN 100.5 F  *CHILLS WITH OR WITHOUT FEVER  NAUSEA AND VOMITING THAT IS NOT CONTROLLED WITH YOUR NAUSEA MEDICATION  *UNUSUAL SHORTNESS OF BREATH  *UNUSUAL BRUISING OR BLEEDING  TENDERNESS IN MOUTH AND THROAT WITH OR WITHOUT PRESENCE OF ULCERS  *URINARY PROBLEMS  *BOWEL PROBLEMS  UNUSUAL RASH Items with * indicate a potential emergency and should be followed up as soon as possible.  Feel free to call the clinic you have any questions or concerns. The clinic phone number is [phone].  

## 2017-02-20 NOTE — Patient Instructions (Signed)
Implanted Port Home Guide An implanted port is a type of central line that is placed under the skin. Central lines are used to provide IV access when treatment or nutrition needs to be given through a person's veins. Implanted ports are used for long-term IV access. An implanted port may be placed because:  You need IV medicine that would be irritating to the small veins in your hands or arms.  You need long-term IV medicines, such as antibiotics.  You need IV nutrition for a long period.  You need frequent blood draws for lab tests.  You need dialysis.  Implanted ports are usually placed in the chest area, but they can also be placed in the upper arm, the abdomen, or the leg. An implanted port has two main parts:  Reservoir. The reservoir is round and will appear as a small, raised area under your skin. The reservoir is the part where a needle is inserted to give medicines or draw blood.  Catheter. The catheter is a thin, flexible tube that extends from the reservoir. The catheter is placed into a large vein. Medicine that is inserted into the reservoir goes into the catheter and then into the vein.  How will I care for my incision site? Do not get the incision site wet. Bathe or shower as directed by your health care provider. How is my port accessed? Special steps must be taken to access the port:  Before the port is accessed, a numbing cream can be placed on the skin. This helps numb the skin over the port site.  Your health care provider uses a sterile technique to access the port. ? Your health care provider must put on a mask and sterile gloves. ? The skin over your port is cleaned carefully with an antiseptic and allowed to dry. ? The port is gently pinched between sterile gloves, and a needle is inserted into the port.  Only "non-coring" port needles should be used to access the port. Once the port is accessed, a blood return should be checked. This helps ensure that the port  is in the vein and is not clogged.  If your port needs to remain accessed for a constant infusion, a clear (transparent) bandage will be placed over the needle site. The bandage and needle will need to be changed every week, or as directed by your health care provider.  Keep the bandage covering the needle clean and dry. Do not get it wet. Follow your health care provider's instructions on how to take a shower or bath while the port is accessed.  If your port does not need to stay accessed, no bandage is needed over the port.  What is flushing? Flushing helps keep the port from getting clogged. Follow your health care provider's instructions on how and when to flush the port. Ports are usually flushed with saline solution or a medicine called heparin. The need for flushing will depend on how the port is used.  If the port is used for intermittent medicines or blood draws, the port will need to be flushed: ? After medicines have been given. ? After blood has been drawn. ? As part of routine maintenance.  If a constant infusion is running, the port may not need to be flushed.  How long will my port stay implanted? The port can stay in for as long as your health care provider thinks it is needed. When it is time for the port to come out, surgery will be   done to remove it. The procedure is similar to the one performed when the port was put in. When should I seek immediate medical care? When you have an implanted port, you should seek immediate medical care if:  You notice a bad smell coming from the incision site.  You have swelling, redness, or drainage at the incision site.  You have more swelling or pain at the port site or the surrounding area.  You have a fever that is not controlled with medicine.  This information is not intended to replace advice given to you by your health care provider. Make sure you discuss any questions you have with your health care provider. Document  Released: 09/10/2005 Document Revised: 02/16/2016 Document Reviewed: 05/18/2013 Elsevier Interactive Patient Education  2017 Elsevier Inc.  

## 2017-02-22 ENCOUNTER — Ambulatory Visit (HOSPITAL_BASED_OUTPATIENT_CLINIC_OR_DEPARTMENT_OTHER): Payer: BLUE CROSS/BLUE SHIELD

## 2017-02-22 VITALS — BP 156/98 | HR 102 | Temp 98.1°F | Resp 17

## 2017-02-22 DIAGNOSIS — C160 Malignant neoplasm of cardia: Secondary | ICD-10-CM | POA: Diagnosis not present

## 2017-02-22 MED ORDER — HEPARIN SOD (PORK) LOCK FLUSH 100 UNIT/ML IV SOLN
500.0000 [IU] | Freq: Once | INTRAVENOUS | Status: AC | PRN
  Administered 2017-02-22: 500 [IU]
  Filled 2017-02-22: qty 5

## 2017-02-22 MED ORDER — SODIUM CHLORIDE 0.9% FLUSH
10.0000 mL | INTRAVENOUS | Status: DC | PRN
  Administered 2017-02-22: 10 mL
  Filled 2017-02-22: qty 10

## 2017-02-27 ENCOUNTER — Other Ambulatory Visit: Payer: Self-pay | Admitting: *Deleted

## 2017-02-27 DIAGNOSIS — C160 Malignant neoplasm of cardia: Secondary | ICD-10-CM

## 2017-02-27 MED ORDER — POTASSIUM CHLORIDE ER 10 MEQ PO CPCR: 10.0000 meq | ORAL_CAPSULE | Freq: Two times a day (BID) | ORAL | 0 refills | Status: DC

## 2017-03-01 ENCOUNTER — Other Ambulatory Visit: Payer: Self-pay | Admitting: *Deleted

## 2017-03-03 ENCOUNTER — Other Ambulatory Visit: Payer: Self-pay | Admitting: Oncology

## 2017-03-06 ENCOUNTER — Ambulatory Visit (HOSPITAL_BASED_OUTPATIENT_CLINIC_OR_DEPARTMENT_OTHER): Payer: BLUE CROSS/BLUE SHIELD

## 2017-03-06 ENCOUNTER — Ambulatory Visit (HOSPITAL_BASED_OUTPATIENT_CLINIC_OR_DEPARTMENT_OTHER): Payer: BLUE CROSS/BLUE SHIELD | Admitting: Oncology

## 2017-03-06 ENCOUNTER — Telehealth: Payer: Self-pay | Admitting: Oncology

## 2017-03-06 ENCOUNTER — Ambulatory Visit: Payer: BLUE CROSS/BLUE SHIELD

## 2017-03-06 ENCOUNTER — Other Ambulatory Visit (HOSPITAL_BASED_OUTPATIENT_CLINIC_OR_DEPARTMENT_OTHER): Payer: BLUE CROSS/BLUE SHIELD

## 2017-03-06 VITALS — BP 98/66 | HR 100 | Temp 98.7°F | Resp 20 | Ht 66.0 in | Wt 174.4 lb

## 2017-03-06 DIAGNOSIS — C770 Secondary and unspecified malignant neoplasm of lymph nodes of head, face and neck: Secondary | ICD-10-CM

## 2017-03-06 DIAGNOSIS — C160 Malignant neoplasm of cardia: Secondary | ICD-10-CM

## 2017-03-06 DIAGNOSIS — Z5111 Encounter for antineoplastic chemotherapy: Secondary | ICD-10-CM | POA: Diagnosis not present

## 2017-03-06 DIAGNOSIS — G620 Drug-induced polyneuropathy: Secondary | ICD-10-CM | POA: Diagnosis not present

## 2017-03-06 DIAGNOSIS — R490 Dysphonia: Secondary | ICD-10-CM | POA: Diagnosis not present

## 2017-03-06 DIAGNOSIS — E785 Hyperlipidemia, unspecified: Secondary | ICD-10-CM

## 2017-03-06 DIAGNOSIS — I10 Essential (primary) hypertension: Secondary | ICD-10-CM | POA: Diagnosis not present

## 2017-03-06 DIAGNOSIS — E119 Type 2 diabetes mellitus without complications: Secondary | ICD-10-CM

## 2017-03-06 LAB — CBC WITH DIFFERENTIAL/PLATELET
BASO%: 0.2 % (ref 0.0–2.0)
BASOS ABS: 0 10*3/uL (ref 0.0–0.1)
EOS ABS: 0.1 10*3/uL (ref 0.0–0.5)
EOS%: 2.2 % (ref 0.0–7.0)
HEMATOCRIT: 38.2 % — AB (ref 38.4–49.9)
HGB: 13 g/dL (ref 13.0–17.1)
LYMPH#: 1.6 10*3/uL (ref 0.9–3.3)
LYMPH%: 27.3 % (ref 14.0–49.0)
MCH: 30.3 pg (ref 27.2–33.4)
MCHC: 34 g/dL (ref 32.0–36.0)
MCV: 89 fL (ref 79.3–98.0)
MONO#: 0.5 10*3/uL (ref 0.1–0.9)
MONO%: 8.1 % (ref 0.0–14.0)
NEUT#: 3.7 10*3/uL (ref 1.5–6.5)
NEUT%: 62.2 % (ref 39.0–75.0)
Platelets: 189 10*3/uL (ref 140–400)
RBC: 4.29 10*6/uL (ref 4.20–5.82)
RDW: 13.9 % (ref 11.0–14.6)
WBC: 5.9 10*3/uL (ref 4.0–10.3)

## 2017-03-06 LAB — COMPREHENSIVE METABOLIC PANEL
ALT: 32 U/L (ref 0–55)
ANION GAP: 10 meq/L (ref 3–11)
AST: 21 U/L (ref 5–34)
Albumin: 3.7 g/dL (ref 3.5–5.0)
Alkaline Phosphatase: 98 U/L (ref 40–150)
BUN: 12.8 mg/dL (ref 7.0–26.0)
CALCIUM: 9.7 mg/dL (ref 8.4–10.4)
CO2: 25 mEq/L (ref 22–29)
Chloride: 108 mEq/L (ref 98–109)
Creatinine: 0.9 mg/dL (ref 0.7–1.3)
Glucose: 220 mg/dl — ABNORMAL HIGH (ref 70–140)
POTASSIUM: 3.9 meq/L (ref 3.5–5.1)
Sodium: 143 mEq/L (ref 136–145)
Total Bilirubin: 0.48 mg/dL (ref 0.20–1.20)
Total Protein: 6.8 g/dL (ref 6.4–8.3)

## 2017-03-06 MED ORDER — SODIUM CHLORIDE 0.9 % IV SOLN
2400.0000 mg/m2 | INTRAVENOUS | Status: DC
  Administered 2017-03-06: 4600 mg via INTRAVENOUS
  Filled 2017-03-06: qty 92

## 2017-03-06 MED ORDER — DEXAMETHASONE SODIUM PHOSPHATE 10 MG/ML IJ SOLN
10.0000 mg | Freq: Once | INTRAMUSCULAR | Status: AC
  Administered 2017-03-06: 10 mg via INTRAVENOUS

## 2017-03-06 MED ORDER — FLUOROURACIL CHEMO INJECTION 2.5 GM/50ML
400.0000 mg/m2 | Freq: Once | INTRAVENOUS | Status: AC
  Administered 2017-03-06: 750 mg via INTRAVENOUS
  Filled 2017-03-06: qty 15

## 2017-03-06 MED ORDER — ATROPINE SULFATE 1 MG/ML IJ SOLN
INTRAMUSCULAR | Status: AC
  Filled 2017-03-06: qty 1

## 2017-03-06 MED ORDER — PALONOSETRON HCL INJECTION 0.25 MG/5ML
0.2500 mg | Freq: Once | INTRAVENOUS | Status: AC
  Administered 2017-03-06: 0.25 mg via INTRAVENOUS

## 2017-03-06 MED ORDER — PALONOSETRON HCL INJECTION 0.25 MG/5ML
INTRAVENOUS | Status: AC
  Filled 2017-03-06: qty 5

## 2017-03-06 MED ORDER — ATROPINE SULFATE 1 MG/ML IJ SOLN
0.5000 mg | Freq: Once | INTRAMUSCULAR | Status: AC | PRN
  Administered 2017-03-06: 0.5 mg via INTRAVENOUS

## 2017-03-06 MED ORDER — DEXAMETHASONE SODIUM PHOSPHATE 10 MG/ML IJ SOLN
INTRAMUSCULAR | Status: AC
  Filled 2017-03-06: qty 1

## 2017-03-06 MED ORDER — SODIUM CHLORIDE 0.9 % IV SOLN
Freq: Once | INTRAVENOUS | Status: AC
  Administered 2017-03-06: 11:00:00 via INTRAVENOUS

## 2017-03-06 MED ORDER — SODIUM CHLORIDE 0.9 % IV SOLN
180.0000 mg/m2 | Freq: Once | INTRAVENOUS | Status: AC
  Administered 2017-03-06: 340 mg via INTRAVENOUS
  Filled 2017-03-06: qty 15

## 2017-03-06 MED ORDER — SODIUM CHLORIDE 0.9 % IV SOLN
400.0000 mg/m2 | Freq: Once | INTRAVENOUS | Status: AC
  Administered 2017-03-06: 764 mg via INTRAVENOUS
  Filled 2017-03-06: qty 38.2

## 2017-03-06 NOTE — Progress Notes (Signed)
[place] OFFICE PROGRESS NOTE   Diagnosis: Gastric cancer  INTERVAL HISTORY:   Robert Beck returns as scheduled. He completed a cycle of FOLFIRI 02/20/2017. He did not have diarrhea following chemotherapy. No mouth sores. He reports nausea beginning a few days after chemotherapy. The nausea was worse after meals. No emesis. Zofran helped the nausea. Mild improvement in neuropathy symptoms  Objective:  Vital signs in last 24 hours:  Blood pressure 98/66, pulse 100, temperature 98.7 F (37.1 C), temperature source Oral, resp. rate 20, height 5\' 6"  (1.676 m), weight 174 lb 6.4 oz (79.1 kg), SpO2 99 %.    HEENT: No thrush or ulcers Lymphatics: No cervical or supra-clavicular nodes Resp: Scattered coarse rhonchi at the lower posterior chest bilaterally, no respiratory distress Cardio: Regular rate and rhythm GI: No hepatosplenomegaly, nontender Vascular: No leg edema   Portacath/PICC-without erythema  Lab Results:  Lab Results  Component Value Date   WBC 5.9 03/06/2017   HGB 13.0 03/06/2017   HCT 38.2 (L) 03/06/2017   MCV 89.0 03/06/2017   PLT 189 03/06/2017   NEUTROABS 3.7 03/06/2017    CMP     Component Value Date/Time   NA 143 03/06/2017 0907   K 3.9 03/06/2017 0907   CL 105 09/13/2016 1000   CO2 25 03/06/2017 0907   GLUCOSE 220 (H) 03/06/2017 0907   BUN 12.8 03/06/2017 0907   CREATININE 0.9 03/06/2017 0907   CALCIUM 9.7 03/06/2017 0907   PROT 6.8 03/06/2017 0907   ALBUMIN 3.7 03/06/2017 0907   AST 21 03/06/2017 0907   ALT 32 03/06/2017 0907   ALKPHOS 98 03/06/2017 0907   BILITOT 0.48 03/06/2017 0907   GFRNONAA >60 09/13/2016 1000   GFRAA >60 09/13/2016 1000     Medications: I have reviewed the patient's current medications.  Assessment/Plan: 1. 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 Decreased gastric cardia wall thickening.  Cycle 7 FOLFOX 07/25/2016  Cycle 8 FOLFOX 08/08/2016  Cycle 9 FOLFOX 08/22/2016 (oxaliplatin held due to neuropathy)  Cycle 10 FOLFOX 09/05/2016 (oxaliplatin held)  Cycle 11 FOLFOX 09/26/2016 (oxaliplatin held)  Cycle 12 FOLFOX 10/10/2016 (oxaliplatin held)  CT 10/18/2016-mild enlargement of mediastinal lymph nodes, no other evidence of disease progression, stable thickening at the GE junction and stable gastrohepatic lymphadenopathy  Cycle 13 FOLFOX 10/24/2016 (oxaliplatin held)  Maintenance Xeloda 7 days on/7 days off beginning 11/17/2016  Restaging chest CT 01/22/2017-interval progression of metastatic lymphadenopathy in the mediastinum and gastrohepatic ligament.  Salvage therapy with FOLFIRI initiated 02/20/2017  2. Anemia secondary to #1-improved  3. Diabetes  4. Gout  5. Hypertension  6. Hyperlipidemia  7. History of a T7 compression fracture  8. Pain secondary to the gastric mass and mediastinal lymphadenopathy-resolved  9. Port-A-Cath placement 04/20/2016  10. 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 1 mm obstructing stone distal right ureter/right ureteral vesicle junction on CT 09/13/2016.  13. Hoarseness 12/19/2016-potentially related  to mediastinal adenopathy-left vocal cord  paralysis confirmed on ENT exam 12/27/2016  14. Hypercalcemia 12/19/2016-asymptomatic    Disposition:  He appears stable. He will complete cycle 2 FOLFIRI today. The plan is to schedule a restaging CT after 5 cycles of FOLFIRI.  Robert Beck will return for an office visit and chemotherapy in 2 weeks. He will contact us if he develops increased nausea after this cycle.  Donneta Romberg, MD  03/06/2017  10:39 AM

## 2017-03-06 NOTE — Telephone Encounter (Signed)
Scheduled appt per 6/13 los -Gave -patient AVS and calender per LOS.

## 2017-03-06 NOTE — Patient Instructions (Signed)
Implanted Port Home Guide An implanted port is a type of central line that is placed under the skin. Central lines are used to provide IV access when treatment or nutrition needs to be given through a person's veins. Implanted ports are used for long-term IV access. An implanted port may be placed because:  You need IV medicine that would be irritating to the small veins in your hands or arms.  You need long-term IV medicines, such as antibiotics.  You need IV nutrition for a long period.  You need frequent blood draws for lab tests.  You need dialysis.  Implanted ports are usually placed in the chest area, but they can also be placed in the upper arm, the abdomen, or the leg. An implanted port has two main parts:  Reservoir. The reservoir is round and will appear as a small, raised area under your skin. The reservoir is the part where a needle is inserted to give medicines or draw blood.  Catheter. The catheter is a thin, flexible tube that extends from the reservoir. The catheter is placed into a large vein. Medicine that is inserted into the reservoir goes into the catheter and then into the vein.  How will I care for my incision site? Do not get the incision site wet. Bathe or shower as directed by your health care provider. How is my port accessed? Special steps must be taken to access the port:  Before the port is accessed, a numbing cream can be placed on the skin. This helps numb the skin over the port site.  Your health care provider uses a sterile technique to access the port. ? Your health care provider must put on a mask and sterile gloves. ? The skin over your port is cleaned carefully with an antiseptic and allowed to dry. ? The port is gently pinched between sterile gloves, and a needle is inserted into the port.  Only "non-coring" port needles should be used to access the port. Once the port is accessed, a blood return should be checked. This helps ensure that the port  is in the vein and is not clogged.  If your port needs to remain accessed for a constant infusion, a clear (transparent) bandage will be placed over the needle site. The bandage and needle will need to be changed every week, or as directed by your health care provider.  Keep the bandage covering the needle clean and dry. Do not get it wet. Follow your health care provider's instructions on how to take a shower or bath while the port is accessed.  If your port does not need to stay accessed, no bandage is needed over the port.  What is flushing? Flushing helps keep the port from getting clogged. Follow your health care provider's instructions on how and when to flush the port. Ports are usually flushed with saline solution or a medicine called heparin. The need for flushing will depend on how the port is used.  If the port is used for intermittent medicines or blood draws, the port will need to be flushed: ? After medicines have been given. ? After blood has been drawn. ? As part of routine maintenance.  If a constant infusion is running, the port may not need to be flushed.  How long will my port stay implanted? The port can stay in for as long as your health care provider thinks it is needed. When it is time for the port to come out, surgery will be   done to remove it. The procedure is similar to the one performed when the port was put in. When should I seek immediate medical care? When you have an implanted port, you should seek immediate medical care if:  You notice a bad smell coming from the incision site.  You have swelling, redness, or drainage at the incision site.  You have more swelling or pain at the port site or the surrounding area.  You have a fever that is not controlled with medicine.  This information is not intended to replace advice given to you by your health care provider. Make sure you discuss any questions you have with your health care provider. Document  Released: 09/10/2005 Document Revised: 02/16/2016 Document Reviewed: 05/18/2013 Elsevier Interactive Patient Education  2017 Elsevier Inc.  

## 2017-03-08 ENCOUNTER — Ambulatory Visit (HOSPITAL_BASED_OUTPATIENT_CLINIC_OR_DEPARTMENT_OTHER): Payer: BLUE CROSS/BLUE SHIELD

## 2017-03-08 VITALS — BP 127/80 | HR 99 | Temp 97.9°F | Resp 20

## 2017-03-08 DIAGNOSIS — C160 Malignant neoplasm of cardia: Secondary | ICD-10-CM

## 2017-03-08 MED ORDER — HEPARIN SOD (PORK) LOCK FLUSH 100 UNIT/ML IV SOLN
500.0000 [IU] | Freq: Once | INTRAVENOUS | Status: AC | PRN
  Administered 2017-03-08: 500 [IU]
  Filled 2017-03-08: qty 5

## 2017-03-08 MED ORDER — SODIUM CHLORIDE 0.9% FLUSH
10.0000 mL | INTRAVENOUS | Status: DC | PRN
  Administered 2017-03-08: 10 mL
  Filled 2017-03-08: qty 10

## 2017-03-17 ENCOUNTER — Other Ambulatory Visit: Payer: Self-pay | Admitting: Oncology

## 2017-03-19 ENCOUNTER — Other Ambulatory Visit: Payer: Self-pay | Admitting: *Deleted

## 2017-03-19 DIAGNOSIS — C160 Malignant neoplasm of cardia: Secondary | ICD-10-CM

## 2017-03-19 MED ORDER — ONDANSETRON HCL 8 MG PO TABS: 8.0000 mg | ORAL_TABLET | Freq: Three times a day (TID) | ORAL | 1 refills | Status: DC | PRN

## 2017-03-20 ENCOUNTER — Ambulatory Visit: Payer: BLUE CROSS/BLUE SHIELD

## 2017-03-20 ENCOUNTER — Ambulatory Visit (HOSPITAL_BASED_OUTPATIENT_CLINIC_OR_DEPARTMENT_OTHER): Payer: BLUE CROSS/BLUE SHIELD

## 2017-03-20 ENCOUNTER — Other Ambulatory Visit (HOSPITAL_BASED_OUTPATIENT_CLINIC_OR_DEPARTMENT_OTHER): Payer: BLUE CROSS/BLUE SHIELD

## 2017-03-20 ENCOUNTER — Ambulatory Visit (HOSPITAL_BASED_OUTPATIENT_CLINIC_OR_DEPARTMENT_OTHER): Payer: BLUE CROSS/BLUE SHIELD | Admitting: Oncology

## 2017-03-20 VITALS — BP 106/76 | HR 84 | Temp 97.8°F | Resp 18 | Ht 66.0 in | Wt 173.6 lb

## 2017-03-20 DIAGNOSIS — C778 Secondary and unspecified malignant neoplasm of lymph nodes of multiple regions: Secondary | ICD-10-CM | POA: Diagnosis not present

## 2017-03-20 DIAGNOSIS — Z5111 Encounter for antineoplastic chemotherapy: Secondary | ICD-10-CM | POA: Diagnosis not present

## 2017-03-20 DIAGNOSIS — C160 Malignant neoplasm of cardia: Secondary | ICD-10-CM

## 2017-03-20 DIAGNOSIS — I10 Essential (primary) hypertension: Secondary | ICD-10-CM

## 2017-03-20 DIAGNOSIS — G620 Drug-induced polyneuropathy: Secondary | ICD-10-CM

## 2017-03-20 DIAGNOSIS — R110 Nausea: Secondary | ICD-10-CM

## 2017-03-20 DIAGNOSIS — Z95828 Presence of other vascular implants and grafts: Secondary | ICD-10-CM

## 2017-03-20 DIAGNOSIS — E119 Type 2 diabetes mellitus without complications: Secondary | ICD-10-CM

## 2017-03-20 LAB — COMPREHENSIVE METABOLIC PANEL
ALT: 35 U/L (ref 0–55)
ANION GAP: 14 meq/L — AB (ref 3–11)
AST: 26 U/L (ref 5–34)
Albumin: 3.7 g/dL (ref 3.5–5.0)
Alkaline Phosphatase: 85 U/L (ref 40–150)
BUN: 12.9 mg/dL (ref 7.0–26.0)
CO2: 23 meq/L (ref 22–29)
Calcium: 9.8 mg/dL (ref 8.4–10.4)
Chloride: 105 mEq/L (ref 98–109)
Creatinine: 0.9 mg/dL (ref 0.7–1.3)
Glucose: 212 mg/dl — ABNORMAL HIGH (ref 70–140)
POTASSIUM: 3.7 meq/L (ref 3.5–5.1)
Sodium: 142 mEq/L (ref 136–145)
Total Bilirubin: 0.56 mg/dL (ref 0.20–1.20)
Total Protein: 6.7 g/dL (ref 6.4–8.3)

## 2017-03-20 LAB — CBC WITH DIFFERENTIAL/PLATELET
BASO%: 0.5 % (ref 0.0–2.0)
BASOS ABS: 0 10*3/uL (ref 0.0–0.1)
EOS%: 2.3 % (ref 0.0–7.0)
Eosinophils Absolute: 0.2 10*3/uL (ref 0.0–0.5)
HEMATOCRIT: 37.8 % — AB (ref 38.4–49.9)
HGB: 13.2 g/dL (ref 13.0–17.1)
LYMPH#: 1.6 10*3/uL (ref 0.9–3.3)
LYMPH%: 23 % (ref 14.0–49.0)
MCH: 30.8 pg (ref 27.2–33.4)
MCHC: 35 g/dL (ref 32.0–36.0)
MCV: 88 fL (ref 79.3–98.0)
MONO#: 0.5 10*3/uL (ref 0.1–0.9)
MONO%: 7.9 % (ref 0.0–14.0)
NEUT#: 4.5 10*3/uL (ref 1.5–6.5)
NEUT%: 66.3 % (ref 39.0–75.0)
PLATELETS: 205 10*3/uL (ref 140–400)
RBC: 4.29 10*6/uL (ref 4.20–5.82)
RDW: 14.9 % — ABNORMAL HIGH (ref 11.0–14.6)
WBC: 6.8 10*3/uL (ref 4.0–10.3)

## 2017-03-20 MED ORDER — PALONOSETRON HCL INJECTION 0.25 MG/5ML
INTRAVENOUS | Status: AC
  Filled 2017-03-20: qty 5

## 2017-03-20 MED ORDER — SODIUM CHLORIDE 0.9 % IV SOLN
400.0000 mg/m2 | Freq: Once | INTRAVENOUS | Status: AC
  Administered 2017-03-20: 764 mg via INTRAVENOUS
  Filled 2017-03-20: qty 38.2

## 2017-03-20 MED ORDER — SODIUM CHLORIDE 0.9 % IV SOLN
Freq: Once | INTRAVENOUS | Status: AC
  Administered 2017-03-20: 10:00:00 via INTRAVENOUS
  Filled 2017-03-20: qty 5

## 2017-03-20 MED ORDER — ATROPINE SULFATE 1 MG/ML IJ SOLN
INTRAMUSCULAR | Status: AC
  Filled 2017-03-20: qty 1

## 2017-03-20 MED ORDER — ATROPINE SULFATE 1 MG/ML IJ SOLN
0.5000 mg | Freq: Once | INTRAMUSCULAR | Status: AC | PRN
  Administered 2017-03-20: 0.5 mg via INTRAVENOUS

## 2017-03-20 MED ORDER — SODIUM CHLORIDE 0.9 % IV SOLN
2400.0000 mg/m2 | INTRAVENOUS | Status: DC
  Administered 2017-03-20: 4600 mg via INTRAVENOUS
  Filled 2017-03-20: qty 92

## 2017-03-20 MED ORDER — PALONOSETRON HCL INJECTION 0.25 MG/5ML
0.2500 mg | Freq: Once | INTRAVENOUS | Status: AC
  Administered 2017-03-20: 0.25 mg via INTRAVENOUS

## 2017-03-20 MED ORDER — FLUOROURACIL CHEMO INJECTION 2.5 GM/50ML
400.0000 mg/m2 | Freq: Once | INTRAVENOUS | Status: AC
  Administered 2017-03-20: 750 mg via INTRAVENOUS
  Filled 2017-03-20: qty 15

## 2017-03-20 MED ORDER — SODIUM CHLORIDE 0.9 % IV SOLN
180.0000 mg/m2 | Freq: Once | INTRAVENOUS | Status: AC
  Administered 2017-03-20: 340 mg via INTRAVENOUS
  Filled 2017-03-20: qty 15

## 2017-03-20 MED ORDER — SODIUM CHLORIDE 0.9 % IV SOLN
Freq: Once | INTRAVENOUS | Status: AC
  Administered 2017-03-20: 10:00:00 via INTRAVENOUS

## 2017-03-20 MED ORDER — SODIUM CHLORIDE 0.9% FLUSH
10.0000 mL | INTRAVENOUS | Status: DC | PRN
  Administered 2017-03-20: 10 mL via INTRAVENOUS
  Filled 2017-03-20: qty 10

## 2017-03-20 NOTE — Patient Instructions (Signed)
[facility] Discharge Instructions for Patients Receiving Chemotherapy  Today you received the following chemotherapy agents Camptosar, Leucovorin, Adrucil  To help prevent nausea and vomiting after your treatment, we encourage you to take your nausea medication   If you develop nausea and vomiting that is not controlled by your nausea medication, call the clinic.   BELOW ARE SYMPTOMS THAT SHOULD BE REPORTED IMMEDIATELY:  *FEVER GREATER THAN 100.5 F  *CHILLS WITH OR WITHOUT FEVER  NAUSEA AND VOMITING THAT IS NOT CONTROLLED WITH YOUR NAUSEA MEDICATION  *UNUSUAL SHORTNESS OF BREATH  *UNUSUAL BRUISING OR BLEEDING  TENDERNESS IN MOUTH AND THROAT WITH OR WITHOUT PRESENCE OF ULCERS  *URINARY PROBLEMS  *BOWEL PROBLEMS  UNUSUAL RASH Items with * indicate a potential emergency and should be followed up as soon as possible.  Feel free to call the clinic you have any questions or concerns. The clinic phone number is [phone].  Please show the CHEMO ALERT CARD at check-in to the Emergency Department and triage nurse.   

## 2017-03-20 NOTE — Patient Instructions (Signed)

## 2017-03-20 NOTE — Progress Notes (Signed)
[place] OFFICE PROGRESS NOTE   Diagnosis: Gastric cancer  INTERVAL HISTORY:   Robert Beck returns as scheduled. He completed another cycle of FOLFIRI 03/06/2017. He reports fatigue following chemotherapy. He has nausea after eating. This began following chemotherapy. Diarrhea is relieved with Imodium. The neuropathy symptoms are slowly improving. No pain. No dysphagia.  Objective:  Vital signs in last 24 hours:  Blood pressure 106/76, pulse 84, temperature 97.8 F (36.6 C), temperature source Oral, resp. rate 18, height 5\' 6"  (1.676 m), weight 173 lb 9.6 oz (78.7 kg), SpO2 97 %.    HEENT: No thrush or ulcers Lymphatics: No cervical or supraclavicular nodes Resp: Lungs clear bilaterally Cardio: Regular rate and rhythm GI: No hepatosplenomegaly, nontender Vascular: No leg edema  Skin: Palms without erythema   Portacath/PICC-without erythema  Lab Results:  Lab Results  Component Value Date   WBC 6.8 03/20/2017   HGB 13.2 03/20/2017   HCT 37.8 (L) 03/20/2017   MCV 88.0 03/20/2017   PLT 205 03/20/2017   NEUTROABS 4.5 03/20/2017    CMP     Component Value Date/Time   NA 142 03/20/2017 0819   K 3.7 03/20/2017 0819   CL 105 09/13/2016 1000   CO2 23 03/20/2017 0819   GLUCOSE 212 (H) 03/20/2017 0819   BUN 12.9 03/20/2017 0819   CREATININE 0.9 03/20/2017 0819   CALCIUM 9.8 03/20/2017 0819   PROT 6.7 03/20/2017 0819   ALBUMIN 3.7 03/20/2017 0819   AST 26 03/20/2017 0819   ALT 35 03/20/2017 0819   ALKPHOS 85 03/20/2017 0819   BILITOT 0.56 03/20/2017 0819   GFRNONAA >60 09/13/2016 1000   GFRAA >60 09/13/2016 1000     Medications: I have reviewed the patient's current medications.  Assessment/Plan: 1. Gastric cancer, gastric cardia mass standing to the GE junction confirmed on endoscopy 04/11/2016, biopsy confirmed adenocarcinoma  Staging CTs of the chest, abdomen, and pelvis 04/09/2016-gastric cardia mass, mediastinal, right hilar, and right  supraclavicular lymphadenopathy  Biopsy of a right supraclavicular lymph node on 04/20/2016 revealed metastatic adenocarcinoma  Cycle 1 FOLFOX 04/30/2016 (oxaliplatin given 04/30/2016, 5-FU infusion started 05/02/2016)  Cycle 2 FOLFOX 05/14/2016  Cycle 3 FOLFOX 05/30/2016  Cycle 4 FOLFOX 06/13/2016  Cycle 5 FOLFOX 06/27/2016  Cycle 6 FOLFOX 07/11/2016  CTs 07/20/2016-decrease in supraclavicular, mediastinal, and gastrohepatic adenopathy. Decreased gastric cardia wall thickening.  Cycle 7 FOLFOX 07/25/2016  Cycle 8 FOLFOX 08/08/2016  Cycle 9 FOLFOX 08/22/2016 (oxaliplatin held due to neuropathy)  Cycle 10 FOLFOX 09/05/2016 (oxaliplatin held)  Cycle 11 FOLFOX 09/26/2016 (oxaliplatin held)  Cycle 12 FOLFOX 10/10/2016 (oxaliplatin held)  CT 10/18/2016-mild enlargement of mediastinal lymph nodes, no other evidence of disease progression, stable thickening at the GE junction and stable gastrohepatic lymphadenopathy  Cycle 13 FOLFOX 10/24/2016 (oxaliplatin held)  Maintenance Xeloda 7 days on/7 days off beginning 11/17/2016  Restaging chest CT 01/22/2017-interval progression of metastatic lymphadenopathy in the mediastinum and gastrohepatic ligament.  Salvage therapy with FOLFIRI initiated 02/20/2017  2. Anemia secondary to #1-improved  3. Diabetes  4. Gout  5. Hypertension  6. Hyperlipidemia  7. History of a T7 compression fracture  8. Pain secondary to the gastric mass and mediastinal lymphadenopathy-resolved  9. Port-A-Cath placement 04/20/2016  10. 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 1 mm obstructing stone distal right ureter/right ureteral vesicle junction on CT 09/13/2016.  13. 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 Hypercalcemia  12/19/2016-asymptomatic  15. Nausea following FOLFIRI-emend added beginning with cycle 3    Disposition:  His overall status appears unchanged. He has completed 2 treatments with FOLFIRI. He will complete cycle 3 today. We will add Emend for nausea. Robert Beck will return for an office visit and cycle 4 FOLFIRI in 2 weeks.  The plan is to schedule a restaging CT after cycle 5.  15 minutes were spent with the patient today. The majority of the time was used for counseling and coordination of care.  Donneta Romberg, MD  03/20/2017  9:33 AM

## 2017-03-21 ENCOUNTER — Other Ambulatory Visit: Payer: Self-pay | Admitting: *Deleted

## 2017-03-21 DIAGNOSIS — C160 Malignant neoplasm of cardia: Secondary | ICD-10-CM

## 2017-03-21 MED ORDER — ONDANSETRON HCL 8 MG PO TABS: 8.0000 mg | ORAL_TABLET | Freq: Three times a day (TID) | ORAL | 1 refills | Status: DC | PRN

## 2017-03-22 ENCOUNTER — Ambulatory Visit (HOSPITAL_BASED_OUTPATIENT_CLINIC_OR_DEPARTMENT_OTHER): Payer: BLUE CROSS/BLUE SHIELD

## 2017-03-22 VITALS — BP 96/79 | HR 77 | Temp 97.9°F | Resp 18

## 2017-03-22 DIAGNOSIS — C160 Malignant neoplasm of cardia: Secondary | ICD-10-CM

## 2017-03-22 DIAGNOSIS — Z452 Encounter for adjustment and management of vascular access device: Secondary | ICD-10-CM

## 2017-03-22 MED ORDER — SODIUM CHLORIDE 0.9% FLUSH
10.0000 mL | INTRAVENOUS | Status: DC | PRN
  Administered 2017-03-22: 10 mL
  Filled 2017-03-22: qty 10

## 2017-03-22 MED ORDER — HEPARIN SOD (PORK) LOCK FLUSH 100 UNIT/ML IV SOLN
500.0000 [IU] | Freq: Once | INTRAVENOUS | Status: AC | PRN
  Administered 2017-03-22: 500 [IU]
  Filled 2017-03-22: qty 5

## 2017-03-31 ENCOUNTER — Other Ambulatory Visit: Payer: Self-pay | Admitting: Oncology

## 2017-04-03 ENCOUNTER — Other Ambulatory Visit (HOSPITAL_BASED_OUTPATIENT_CLINIC_OR_DEPARTMENT_OTHER): Payer: BLUE CROSS/BLUE SHIELD

## 2017-04-03 ENCOUNTER — Ambulatory Visit: Payer: BLUE CROSS/BLUE SHIELD

## 2017-04-03 ENCOUNTER — Ambulatory Visit (HOSPITAL_BASED_OUTPATIENT_CLINIC_OR_DEPARTMENT_OTHER): Payer: BLUE CROSS/BLUE SHIELD

## 2017-04-03 ENCOUNTER — Telehealth: Payer: Self-pay | Admitting: Oncology

## 2017-04-03 ENCOUNTER — Ambulatory Visit (HOSPITAL_BASED_OUTPATIENT_CLINIC_OR_DEPARTMENT_OTHER): Payer: BLUE CROSS/BLUE SHIELD | Admitting: Oncology

## 2017-04-03 VITALS — BP 115/64

## 2017-04-03 VITALS — BP 95/60 | HR 90 | Temp 97.7°F | Resp 17 | Ht 66.0 in | Wt 169.7 lb

## 2017-04-03 DIAGNOSIS — R110 Nausea: Secondary | ICD-10-CM

## 2017-04-03 DIAGNOSIS — Z95828 Presence of other vascular implants and grafts: Secondary | ICD-10-CM

## 2017-04-03 DIAGNOSIS — C160 Malignant neoplasm of cardia: Secondary | ICD-10-CM

## 2017-04-03 DIAGNOSIS — I959 Hypotension, unspecified: Secondary | ICD-10-CM

## 2017-04-03 DIAGNOSIS — G620 Drug-induced polyneuropathy: Secondary | ICD-10-CM

## 2017-04-03 DIAGNOSIS — Z5111 Encounter for antineoplastic chemotherapy: Secondary | ICD-10-CM

## 2017-04-03 DIAGNOSIS — M109 Gout, unspecified: Secondary | ICD-10-CM | POA: Diagnosis not present

## 2017-04-03 DIAGNOSIS — C778 Secondary and unspecified malignant neoplasm of lymph nodes of multiple regions: Secondary | ICD-10-CM

## 2017-04-03 DIAGNOSIS — E785 Hyperlipidemia, unspecified: Secondary | ICD-10-CM

## 2017-04-03 DIAGNOSIS — E119 Type 2 diabetes mellitus without complications: Secondary | ICD-10-CM | POA: Diagnosis not present

## 2017-04-03 DIAGNOSIS — I10 Essential (primary) hypertension: Secondary | ICD-10-CM | POA: Diagnosis not present

## 2017-04-03 LAB — CBC WITH DIFFERENTIAL/PLATELET
BASO%: 0.4 % (ref 0.0–2.0)
Basophils Absolute: 0 10*3/uL (ref 0.0–0.1)
EOS%: 1.2 % (ref 0.0–7.0)
Eosinophils Absolute: 0.1 10*3/uL (ref 0.0–0.5)
HEMATOCRIT: 37.6 % — AB (ref 38.4–49.9)
HEMOGLOBIN: 13.3 g/dL (ref 13.0–17.1)
LYMPH#: 1.6 10*3/uL (ref 0.9–3.3)
LYMPH%: 21.3 % (ref 14.0–49.0)
MCH: 30.8 pg (ref 27.2–33.4)
MCHC: 35.3 g/dL (ref 32.0–36.0)
MCV: 87.3 fL (ref 79.3–98.0)
MONO#: 0.6 10*3/uL (ref 0.1–0.9)
MONO%: 8.2 % (ref 0.0–14.0)
NEUT#: 5.2 10*3/uL (ref 1.5–6.5)
NEUT%: 68.9 % (ref 39.0–75.0)
Platelets: 213 10*3/uL (ref 140–400)
RBC: 4.31 10*6/uL (ref 4.20–5.82)
RDW: 15.5 % — AB (ref 11.0–14.6)
WBC: 7.6 10*3/uL (ref 4.0–10.3)

## 2017-04-03 LAB — COMPREHENSIVE METABOLIC PANEL
ALBUMIN: 3.9 g/dL (ref 3.5–5.0)
ALK PHOS: 82 U/L (ref 40–150)
ALT: 30 U/L (ref 0–55)
AST: 20 U/L (ref 5–34)
Anion Gap: 12 mEq/L — ABNORMAL HIGH (ref 3–11)
BUN: 8.8 mg/dL (ref 7.0–26.0)
CALCIUM: 9.8 mg/dL (ref 8.4–10.4)
CO2: 24 mEq/L (ref 22–29)
CREATININE: 0.8 mg/dL (ref 0.7–1.3)
Chloride: 107 mEq/L (ref 98–109)
EGFR: >90 mL/min/{1.73_m2} (ref >90)
Glucose: 113 mg/dl (ref 70–140)
Potassium: 3.3 mEq/L — ABNORMAL LOW (ref 3.5–5.1)
Sodium: 143 mEq/L (ref 136–145)
Total Bilirubin: 0.35 mg/dL (ref 0.20–1.20)
Total Protein: 6.7 g/dL (ref 6.4–8.3)

## 2017-04-03 MED ORDER — SODIUM CHLORIDE 0.9% FLUSH
10.0000 mL | INTRAVENOUS | Status: DC | PRN
  Administered 2017-04-03: 10 mL via INTRAVENOUS
  Filled 2017-04-03: qty 10

## 2017-04-03 MED ORDER — SODIUM CHLORIDE 0.9 % IV SOLN
Freq: Once | INTRAVENOUS | Status: AC
  Administered 2017-04-03: 11:00:00 via INTRAVENOUS
  Filled 2017-04-03: qty 5

## 2017-04-03 MED ORDER — ATROPINE SULFATE 1 MG/ML IJ SOLN
0.5000 mg | Freq: Once | INTRAMUSCULAR | Status: AC | PRN
  Administered 2017-04-03: 0.5 mg via INTRAVENOUS

## 2017-04-03 MED ORDER — PALONOSETRON HCL INJECTION 0.25 MG/5ML
INTRAVENOUS | Status: AC
  Filled 2017-04-03: qty 5

## 2017-04-03 MED ORDER — SODIUM CHLORIDE 0.9 % IV SOLN
Freq: Once | INTRAVENOUS | Status: AC
  Administered 2017-04-03: 11:00:00 via INTRAVENOUS

## 2017-04-03 MED ORDER — SODIUM CHLORIDE 0.9 % IV SOLN
2400.0000 mg/m2 | INTRAVENOUS | Status: DC
  Administered 2017-04-03: 4600 mg via INTRAVENOUS
  Filled 2017-04-03: qty 92

## 2017-04-03 MED ORDER — LEUCOVORIN CALCIUM INJECTION 350 MG
400.0000 mg/m2 | Freq: Once | INTRAVENOUS | Status: AC
  Administered 2017-04-03: 764 mg via INTRAVENOUS
  Filled 2017-04-03: qty 38.2

## 2017-04-03 MED ORDER — FLUOROURACIL CHEMO INJECTION 2.5 GM/50ML
400.0000 mg/m2 | Freq: Once | INTRAVENOUS | Status: AC
  Administered 2017-04-03: 750 mg via INTRAVENOUS
  Filled 2017-04-03: qty 15

## 2017-04-03 MED ORDER — PALONOSETRON HCL INJECTION 0.25 MG/5ML
0.2500 mg | Freq: Once | INTRAVENOUS | Status: AC
Start: 2017-04-03 — End: 2017-04-03
  Administered 2017-04-03: 0.25 mg via INTRAVENOUS

## 2017-04-03 MED ORDER — IRINOTECAN HCL CHEMO INJECTION 100 MG/5ML
180.0000 mg/m2 | Freq: Once | INTRAVENOUS | Status: AC
  Administered 2017-04-03: 340 mg via INTRAVENOUS
  Filled 2017-04-03: qty 15

## 2017-04-03 MED ORDER — ATROPINE SULFATE 1 MG/ML IJ SOLN
INTRAMUSCULAR | Status: AC
  Filled 2017-04-03: qty 1

## 2017-04-03 NOTE — Telephone Encounter (Signed)
Scheduled appt per 7/11 los - Gave patient AVS and calender per los.  

## 2017-04-03 NOTE — Patient Instructions (Signed)
Implanted Port Home Guide An implanted port is a type of central line that is placed under the skin. Central lines are used to provide IV access when treatment or nutrition needs to be given through a person's veins. Implanted ports are used for long-term IV access. An implanted port may be placed because:  You need IV medicine that would be irritating to the small veins in your hands or arms.  You need long-term IV medicines, such as antibiotics.  You need IV nutrition for a long period.  You need frequent blood draws for lab tests.  You need dialysis.  Implanted ports are usually placed in the chest area, but they can also be placed in the upper arm, the abdomen, or the leg. An implanted port has two main parts:  Reservoir. The reservoir is round and will appear as a small, raised area under your skin. The reservoir is the part where a needle is inserted to give medicines or draw blood.  Catheter. The catheter is a thin, flexible tube that extends from the reservoir. The catheter is placed into a large vein. Medicine that is inserted into the reservoir goes into the catheter and then into the vein.  How will I care for my incision site? Do not get the incision site wet. Bathe or shower as directed by your health care provider. How is my port accessed? Special steps must be taken to access the port:  Before the port is accessed, a numbing cream can be placed on the skin. This helps numb the skin over the port site.  Your health care provider uses a sterile technique to access the port. ? Your health care provider must put on a mask and sterile gloves. ? The skin over your port is cleaned carefully with an antiseptic and allowed to dry. ? The port is gently pinched between sterile gloves, and a needle is inserted into the port.  Only "non-coring" port needles should be used to access the port. Once the port is accessed, a blood return should be checked. This helps ensure that the port  is in the vein and is not clogged.  If your port needs to remain accessed for a constant infusion, a clear (transparent) bandage will be placed over the needle site. The bandage and needle will need to be changed every week, or as directed by your health care provider.  Keep the bandage covering the needle clean and dry. Do not get it wet. Follow your health care provider's instructions on how to take a shower or bath while the port is accessed.  If your port does not need to stay accessed, no bandage is needed over the port.  What is flushing? Flushing helps keep the port from getting clogged. Follow your health care provider's instructions on how and when to flush the port. Ports are usually flushed with saline solution or a medicine called heparin. The need for flushing will depend on how the port is used.  If the port is used for intermittent medicines or blood draws, the port will need to be flushed: ? After medicines have been given. ? After blood has been drawn. ? As part of routine maintenance.  If a constant infusion is running, the port may not need to be flushed.  How long will my port stay implanted? The port can stay in for as long as your health care provider thinks it is needed. When it is time for the port to come out, surgery will be   done to remove it. The procedure is similar to the one performed when the port was put in. When should I seek immediate medical care? When you have an implanted port, you should seek immediate medical care if:  You notice a bad smell coming from the incision site.  You have swelling, redness, or drainage at the incision site.  You have more swelling or pain at the port site or the surrounding area.  You have a fever that is not controlled with medicine.  This information is not intended to replace advice given to you by your health care provider. Make sure you discuss any questions you have with your health care provider. Document  Released: 09/10/2005 Document Revised: 02/16/2016 Document Reviewed: 05/18/2013 Elsevier Interactive Patient Education  2017 Elsevier Inc.  

## 2017-04-03 NOTE — Progress Notes (Signed)
[place] OFFICE PROGRESS NOTE   Diagnosis: Gastric cancer  INTERVAL HISTORY:   Robert Beck returns as scheduled. He completed another cycle of FOLFIRI beginning 03/20/2017. He reports mild diarrhea following chemotherapy, improved with Imodium. Nausea is relieved with Zofran. Good appetite. No dysphagia. No pain. The peripheral numbness is improving. He has not taken potassium for several days.  Objective:  Vital signs in last 24 hours:  Blood pressure (!) 83/64, pulse 90, temperature 97.7 F (36.5 C), temperature source Oral, resp. rate 17, height 5\' 6"  (1.676 m), weight 169 lb 11.2 oz (77 kg), SpO2 98 %.    HEENT: No thrush or ulcers Lymphatics: No cervical or supraclavicular nodes Resp: Lungs clear bilaterally Cardio: Regular rate and rhythm GI: No hepatomegaly, nontender Vascular: No leg edema  Skin: Palms without erythema   Portacath/PICC-without erythema  Lab Results:  Lab Results  Component Value Date   WBC 7.6 04/03/2017   HGB 13.3 04/03/2017   HCT 37.6 (L) 04/03/2017   MCV 87.3 04/03/2017   PLT 213 04/03/2017   NEUTROABS 5.2 04/03/2017    CMP     Component Value Date/Time   NA 142 03/20/2017 0819   K 3.7 03/20/2017 0819   CL 105 09/13/2016 1000   CO2 23 03/20/2017 0819   GLUCOSE 212 (H) 03/20/2017 0819   BUN 12.9 03/20/2017 0819   CREATININE 0.9 03/20/2017 0819   CALCIUM 9.8 03/20/2017 0819   PROT 6.7 03/20/2017 0819   ALBUMIN 3.7 03/20/2017 0819   AST 26 03/20/2017 0819   ALT 35 03/20/2017 0819   ALKPHOS 85 03/20/2017 0819   BILITOT 0.56 03/20/2017 0819   GFRNONAA >60 09/13/2016 1000   GFRAA >60 09/13/2016 1000     Medications: I have reviewed the patient's current medications.  Assessment/Plan: 1. Gastric cancer, gastric cardia mass standing to the GE junction confirmed on endoscopy 04/11/2016, biopsy confirmed adenocarcinoma  Staging CTs of the chest, abdomen, and pelvis 04/09/2016-gastric cardia mass, mediastinal,  right hilar, and right supraclavicular lymphadenopathy  Biopsy of a right supraclavicular lymph node on 04/20/2016 revealed metastatic adenocarcinoma  Cycle 1 FOLFOX 04/30/2016 (oxaliplatin given 04/30/2016, 5-FU infusion started 05/02/2016)  Cycle 2 FOLFOX 05/14/2016  Cycle 3 FOLFOX 05/30/2016  Cycle 4 FOLFOX 06/13/2016  Cycle 5 FOLFOX 06/27/2016  Cycle 6 FOLFOX 07/11/2016  CTs 07/20/2016-decrease in supraclavicular, mediastinal, and gastrohepatic adenopathy. Decreased gastric cardia wall thickening.  Cycle 7 FOLFOX 07/25/2016  Cycle 8 FOLFOX 08/08/2016  Cycle 9 FOLFOX 08/22/2016 (oxaliplatin held due to neuropathy)  Cycle 10 FOLFOX 09/05/2016 (oxaliplatin held)  Cycle 11 FOLFOX 09/26/2016 (oxaliplatin held)  Cycle 12 FOLFOX 10/10/2016 (oxaliplatin held)  CT 10/18/2016-mild enlargement of mediastinal lymph nodes, no other evidence of disease progression, stable thickening at the GE junction and stable gastrohepatic lymphadenopathy  Cycle 13 FOLFOX 10/24/2016 (oxaliplatin held)  Maintenance Xeloda 7 days on/7 days off beginning 11/17/2016  Restaging chest CT 01/22/2017-interval progression of metastatic lymphadenopathy in the mediastinum and gastrohepatic ligament.  Salvage therapy with FOLFIRI initiated 02/20/2017  2. Anemia secondary to #1-improved  3. Diabetes  4. Gout  5. Hypertension  6. Hyperlipidemia  7. History of a T7 compression fracture  8. Pain secondary to the gastric mass and mediastinal lymphadenopathy-resolved  9. Port-A-Cath placement 04/20/2016  10. 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 1 mm obstructing stone distal right ureter/right ureteral vesicle junction on CT 09/13/2016.  13. Hoarseness 12/19/2016-potentially related to mediastinal adenopathy-left vocal cord  paralysis confirmed on ENT exam 12/27/2016  14.  Hypercalcemia 12/19/2016-asymptomatic  15. Nausea following FOLFIRI-emend added beginning with cycle 3    Disposition:  He appears unchanged. The plan is to proceed with cycle 4 of FOLFIRI today. We will repeat his blood pressure prior to chemotherapy today. He will hold the almost heart and/HCTZ if the blood pressure remains low. He will resume the potassium supplement.  Robert Beck will return for an office visit and chemotherapy in 2 weeks.  15 minutes were spent with the patient today. The majority of the time was used for counseling and coordination of care.  Donneta Romberg, MD  04/03/2017  9:26 AM

## 2017-04-03 NOTE — Patient Instructions (Signed)
[facility] Discharge Instructions for Patients Receiving Chemotherapy  Today you received the following chemotherapy agents Camptosar, Leucovorin, Adrucil  To help prevent nausea and vomiting after your treatment, we encourage you to take your nausea medication   If you develop nausea and vomiting that is not controlled by your nausea medication, call the clinic.   BELOW ARE SYMPTOMS THAT SHOULD BE REPORTED IMMEDIATELY:  *FEVER GREATER THAN 100.5 F  *CHILLS WITH OR WITHOUT FEVER  NAUSEA AND VOMITING THAT IS NOT CONTROLLED WITH YOUR NAUSEA MEDICATION  *UNUSUAL SHORTNESS OF BREATH  *UNUSUAL BRUISING OR BLEEDING  TENDERNESS IN MOUTH AND THROAT WITH OR WITHOUT PRESENCE OF ULCERS  *URINARY PROBLEMS  *BOWEL PROBLEMS  UNUSUAL RASH Items with * indicate a potential emergency and should be followed up as soon as possible.  Feel free to call the clinic you have any questions or concerns. The clinic phone number is [phone].  Please show the CHEMO ALERT CARD at check-in to the Emergency Department and triage nurse.   

## 2017-04-05 ENCOUNTER — Ambulatory Visit (HOSPITAL_BASED_OUTPATIENT_CLINIC_OR_DEPARTMENT_OTHER): Payer: BLUE CROSS/BLUE SHIELD

## 2017-04-05 DIAGNOSIS — Z95828 Presence of other vascular implants and grafts: Secondary | ICD-10-CM

## 2017-04-05 DIAGNOSIS — C160 Malignant neoplasm of cardia: Secondary | ICD-10-CM

## 2017-04-05 MED ORDER — HEPARIN SOD (PORK) LOCK FLUSH 100 UNIT/ML IV SOLN
500.0000 [IU] | Freq: Once | INTRAVENOUS | Status: AC | PRN
  Administered 2017-04-05: 500 [IU] via INTRAVENOUS
  Filled 2017-04-05: qty 5

## 2017-04-05 MED ORDER — SODIUM CHLORIDE 0.9% FLUSH
10.0000 mL | INTRAVENOUS | Status: DC | PRN
  Administered 2017-04-05: 10 mL via INTRAVENOUS
  Filled 2017-04-05: qty 10

## 2017-04-05 NOTE — Patient Instructions (Signed)

## 2017-04-17 ENCOUNTER — Telehealth: Payer: Self-pay | Admitting: Oncology

## 2017-04-17 ENCOUNTER — Ambulatory Visit (HOSPITAL_BASED_OUTPATIENT_CLINIC_OR_DEPARTMENT_OTHER): Payer: BLUE CROSS/BLUE SHIELD | Admitting: Nurse Practitioner

## 2017-04-17 ENCOUNTER — Ambulatory Visit (HOSPITAL_BASED_OUTPATIENT_CLINIC_OR_DEPARTMENT_OTHER): Payer: BLUE CROSS/BLUE SHIELD

## 2017-04-17 ENCOUNTER — Ambulatory Visit: Payer: BLUE CROSS/BLUE SHIELD

## 2017-04-17 ENCOUNTER — Other Ambulatory Visit (HOSPITAL_BASED_OUTPATIENT_CLINIC_OR_DEPARTMENT_OTHER): Payer: BLUE CROSS/BLUE SHIELD

## 2017-04-17 VITALS — BP 138/74 | HR 88 | Temp 97.7°F | Resp 17 | Ht 66.0 in | Wt 170.6 lb

## 2017-04-17 DIAGNOSIS — I10 Essential (primary) hypertension: Secondary | ICD-10-CM | POA: Diagnosis not present

## 2017-04-17 DIAGNOSIS — Z5111 Encounter for antineoplastic chemotherapy: Secondary | ICD-10-CM | POA: Diagnosis not present

## 2017-04-17 DIAGNOSIS — G620 Drug-induced polyneuropathy: Secondary | ICD-10-CM | POA: Diagnosis not present

## 2017-04-17 DIAGNOSIS — C160 Malignant neoplasm of cardia: Secondary | ICD-10-CM

## 2017-04-17 DIAGNOSIS — R110 Nausea: Secondary | ICD-10-CM

## 2017-04-17 DIAGNOSIS — E119 Type 2 diabetes mellitus without complications: Secondary | ICD-10-CM

## 2017-04-17 DIAGNOSIS — C778 Secondary and unspecified malignant neoplasm of lymph nodes of multiple regions: Secondary | ICD-10-CM

## 2017-04-17 DIAGNOSIS — R197 Diarrhea, unspecified: Secondary | ICD-10-CM

## 2017-04-17 LAB — COMPREHENSIVE METABOLIC PANEL
ALT: 28 U/L (ref 0–55)
ANION GAP: 10 meq/L (ref 3–11)
AST: 22 U/L (ref 5–34)
Albumin: 3.7 g/dL (ref 3.5–5.0)
Alkaline Phosphatase: 82 U/L (ref 40–150)
BILIRUBIN TOTAL: 0.68 mg/dL (ref 0.20–1.20)
BUN: 6.5 mg/dL — AB (ref 7.0–26.0)
CHLORIDE: 109 meq/L (ref 98–109)
CO2: 23 meq/L (ref 22–29)
CREATININE: 0.8 mg/dL (ref 0.7–1.3)
Calcium: 9.4 mg/dL (ref 8.4–10.4)
EGFR: >90 mL/min/{1.73_m2} (ref >90)
GLUCOSE: 99 mg/dL (ref 70–140)
Potassium: 3.3 mEq/L — ABNORMAL LOW (ref 3.5–5.1)
Sodium: 143 mEq/L (ref 136–145)
TOTAL PROTEIN: 6.4 g/dL (ref 6.4–8.3)

## 2017-04-17 LAB — CBC WITH DIFFERENTIAL/PLATELET
BASO%: 0.3 % (ref 0.0–2.0)
Basophils Absolute: 0 10*3/uL (ref 0.0–0.1)
EOS%: 2.5 % (ref 0.0–7.0)
Eosinophils Absolute: 0.2 10*3/uL (ref 0.0–0.5)
HEMATOCRIT: 35.7 % — AB (ref 38.4–49.9)
HGB: 12.3 g/dL — ABNORMAL LOW (ref 13.0–17.1)
LYMPH#: 1.4 10*3/uL (ref 0.9–3.3)
LYMPH%: 21.5 % (ref 14.0–49.0)
MCH: 30.1 pg (ref 27.2–33.4)
MCHC: 34.5 g/dL (ref 32.0–36.0)
MCV: 87.3 fL (ref 79.3–98.0)
MONO#: 0.6 10*3/uL (ref 0.1–0.9)
MONO%: 9.5 % (ref 0.0–14.0)
NEUT%: 66.2 % (ref 39.0–75.0)
NEUTROS ABS: 4.2 10*3/uL (ref 1.5–6.5)
PLATELETS: 152 10*3/uL (ref 140–400)
RBC: 4.09 10*6/uL — AB (ref 4.20–5.82)
RDW: 16.1 % — ABNORMAL HIGH (ref 11.0–14.6)
WBC: 6.3 10*3/uL (ref 4.0–10.3)

## 2017-04-17 MED ORDER — SODIUM CHLORIDE 0.9% FLUSH
10.0000 mL | Freq: Once | INTRAVENOUS | Status: AC
  Administered 2017-04-17: 10 mL via INTRAVENOUS
  Filled 2017-04-17: qty 10

## 2017-04-17 MED ORDER — PALONOSETRON HCL INJECTION 0.25 MG/5ML
0.2500 mg | Freq: Once | INTRAVENOUS | Status: AC
  Administered 2017-04-17: 0.25 mg via INTRAVENOUS

## 2017-04-17 MED ORDER — ATROPINE SULFATE 1 MG/ML IJ SOLN
0.5000 mg | Freq: Once | INTRAMUSCULAR | Status: AC | PRN
  Administered 2017-04-17: 0.5 mg via INTRAVENOUS

## 2017-04-17 MED ORDER — HYDROCODONE-ACETAMINOPHEN 5-325 MG PO TABS: 1.0000 | ORAL_TABLET | Freq: Four times a day (QID) | ORAL | 0 refills | Status: AC | PRN

## 2017-04-17 MED ORDER — ATROPINE SULFATE 1 MG/ML IJ SOLN
INTRAMUSCULAR | Status: AC
  Filled 2017-04-17: qty 1

## 2017-04-17 MED ORDER — SODIUM CHLORIDE 0.9 % IV SOLN
Freq: Once | INTRAVENOUS | Status: AC
  Administered 2017-04-17: 10:00:00 via INTRAVENOUS
  Filled 2017-04-17: qty 5

## 2017-04-17 MED ORDER — IRINOTECAN HCL CHEMO INJECTION 100 MG/5ML
180.0000 mg/m2 | Freq: Once | INTRAVENOUS | Status: AC
  Administered 2017-04-17: 340 mg via INTRAVENOUS
  Filled 2017-04-17: qty 15

## 2017-04-17 MED ORDER — FLUOROURACIL CHEMO INJECTION 2.5 GM/50ML
400.0000 mg/m2 | Freq: Once | INTRAVENOUS | Status: AC
  Administered 2017-04-17: 750 mg via INTRAVENOUS
  Filled 2017-04-17: qty 15

## 2017-04-17 MED ORDER — LORAZEPAM 0.5 MG PO TABS: 0.5000 mg | ORAL_TABLET | Freq: Three times a day (TID) | ORAL | 0 refills | Status: DC | PRN

## 2017-04-17 MED ORDER — LEUCOVORIN CALCIUM INJECTION 350 MG
400.0000 mg/m2 | Freq: Once | INTRAVENOUS | Status: AC
  Administered 2017-04-17: 764 mg via INTRAVENOUS
  Filled 2017-04-17: qty 38.2

## 2017-04-17 MED ORDER — FLUOROURACIL CHEMO INJECTION 5 GM/100ML
2400.0000 mg/m2 | INTRAVENOUS | Status: DC
  Administered 2017-04-17: 4600 mg via INTRAVENOUS
  Filled 2017-04-17: qty 92

## 2017-04-17 MED ORDER — SODIUM CHLORIDE 0.9 % IV SOLN
Freq: Once | INTRAVENOUS | Status: AC
  Administered 2017-04-17: 10:00:00 via INTRAVENOUS

## 2017-04-17 MED ORDER — PALONOSETRON HCL INJECTION 0.25 MG/5ML
INTRAVENOUS | Status: AC
  Filled 2017-04-17: qty 5

## 2017-04-17 NOTE — Patient Instructions (Signed)
Implanted Port Home Guide An implanted port is a type of central line that is placed under the skin. Central lines are used to provide IV access when treatment or nutrition needs to be given through a person's veins. Implanted ports are used for long-term IV access. An implanted port may be placed because:  You need IV medicine that would be irritating to the small veins in your hands or arms.  You need long-term IV medicines, such as antibiotics.  You need IV nutrition for a long period.  You need frequent blood draws for lab tests.  You need dialysis.  Implanted ports are usually placed in the chest area, but they can also be placed in the upper arm, the abdomen, or the leg. An implanted port has two main parts:  Reservoir. The reservoir is round and will appear as a small, raised area under your skin. The reservoir is the part where a needle is inserted to give medicines or draw blood.  Catheter. The catheter is a thin, flexible tube that extends from the reservoir. The catheter is placed into a large vein. Medicine that is inserted into the reservoir goes into the catheter and then into the vein.  How will I care for my incision site? Do not get the incision site wet. Bathe or shower as directed by your health care provider. How is my port accessed? Special steps must be taken to access the port:  Before the port is accessed, a numbing cream can be placed on the skin. This helps numb the skin over the port site.  Your health care provider uses a sterile technique to access the port. ? Your health care provider must put on a mask and sterile gloves. ? The skin over your port is cleaned carefully with an antiseptic and allowed to dry. ? The port is gently pinched between sterile gloves, and a needle is inserted into the port.  Only "non-coring" port needles should be used to access the port. Once the port is accessed, a blood return should be checked. This helps ensure that the port  is in the vein and is not clogged.  If your port needs to remain accessed for a constant infusion, a clear (transparent) bandage will be placed over the needle site. The bandage and needle will need to be changed every week, or as directed by your health care provider.  Keep the bandage covering the needle clean and dry. Do not get it wet. Follow your health care provider's instructions on how to take a shower or bath while the port is accessed.  If your port does not need to stay accessed, no bandage is needed over the port.  What is flushing? Flushing helps keep the port from getting clogged. Follow your health care provider's instructions on how and when to flush the port. Ports are usually flushed with saline solution or a medicine called heparin. The need for flushing will depend on how the port is used.  If the port is used for intermittent medicines or blood draws, the port will need to be flushed: ? After medicines have been given. ? After blood has been drawn. ? As part of routine maintenance.  If a constant infusion is running, the port may not need to be flushed.  How long will my port stay implanted? The port can stay in for as long as your health care provider thinks it is needed. When it is time for the port to come out, surgery will be   done to remove it. The procedure is similar to the one performed when the port was put in. When should I seek immediate medical care? When you have an implanted port, you should seek immediate medical care if:  You notice a bad smell coming from the incision site.  You have swelling, redness, or drainage at the incision site.  You have more swelling or pain at the port site or the surrounding area.  You have a fever that is not controlled with medicine.  This information is not intended to replace advice given to you by your health care provider. Make sure you discuss any questions you have with your health care provider. Document  Released: 09/10/2005 Document Revised: 02/16/2016 Document Reviewed: 05/18/2013 Elsevier Interactive Patient Education  2017 Elsevier Inc.  

## 2017-04-17 NOTE — Progress Notes (Signed)
Patient potassium 3.3 patient is currently taking one tab a day of Potassium. Dr. Benay Spice notified. Patient to continue on current does of potassium supplementation. Patient verbalized understanding.

## 2017-04-17 NOTE — Telephone Encounter (Signed)
Gave wife avs report and appointments for July and August.

## 2017-04-17 NOTE — Progress Notes (Signed)
[facility] OFFICE PROGRESS NOTE   Diagnosis:  Gastric cancer  INTERVAL HISTORY:   Robert Beck returns as scheduled. He completed cycle 4 FOLFIRI 04/03/2017. He noted more nausea and diarrhea than with previous cycles. Compazine and Zofran were effective for the nausea. Imodium controls the diarrhea. No mouth sores. He has intermittent chest pain which has been occurring since diagnosis. He takes hydrocodone as needed.  Objective:  Vital signs in last 24 hours:  Blood pressure 138/74, pulse 88, temperature 97.7 F (36.5 C), temperature source Oral, resp. rate 17, height 5\' 6"  (1.676 m), weight 170 lb 9.6 oz (77.4 kg), SpO2 99 %.    HEENT: No thrush or ulcers. Resp: Lungs clear bilaterally. Cardio: Regular rate and rhythm. GI: Abdomen soft and nontender. No hepatomegaly. Vascular: No leg edema.  Port-A-Cath without erythema.  Lab Results:  Lab Results  Component Value Date   WBC 6.3 04/17/2017   HGB 12.3 (L) 04/17/2017   HCT 35.7 (L) 04/17/2017   MCV 87.3 04/17/2017   PLT 152 04/17/2017   NEUTROABS 4.2 04/17/2017    Imaging:  No results found.  Medications: I have reviewed the patient's current medications.  Assessment/Plan: 1. 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 Decreased gastric cardia wall  thickening.  Cycle 7 FOLFOX 07/25/2016  Cycle 8 FOLFOX 08/08/2016  Cycle 9 FOLFOX 08/22/2016 (oxaliplatin held due to neuropathy)  Cycle 10 FOLFOX 09/05/2016 (oxaliplatin held)  Cycle 11 FOLFOX 09/26/2016 (oxaliplatin held)  Cycle 12 FOLFOX 10/10/2016 (oxaliplatin held)  CT 10/18/2016-mild enlargement of mediastinal lymph nodes, no other evidence of disease progression, stable thickening at the GE junction and stable gastrohepatic lymphadenopathy  Cycle 13 FOLFOX 10/24/2016 (oxaliplatin held)  Maintenance Xeloda 7 days on/7 days off beginning 11/17/2016  Restaging chest CT 01/22/2017-interval progression of metastatic lymphadenopathy in the mediastinum and gastrohepatic ligament.  Salvage therapy with FOLFIRI initiated 02/20/2017  Cycle 5 FOLFIRI 04/17/2017  2. Anemia secondary to #1-improved  3. Diabetes  4. Gout  5. Hypertension  6. Hyperlipidemia  7. History of a T7 compression fracture  8. Pain secondary to the gastric mass and mediastinal lymphadenopathy-resolved  9. Port-A-Cath placement 04/20/2016  10. 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 1 mm obstructing stone distal right ureter/right ureteral vesicle junction on CT 09/13/2016.  13. 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 Hypercalcemia 12/19/2016-asymptomatic  15. Nausea following FOLFIRI-emend added beginning with cycle 3    Disposition: Robert Beck appears stable. He has completed 4 cycles of FOLFIRI. Plan to proceed with cycle 5 today as scheduled. He will have restaging CT scans prior to his next visit in 2 weeks.  He had more nausea following cycle 4 than with previous cycles. He receives Aloxi, Emend and dexamethasone day 1. He has Compazine and Zofran at home for as needed use.  I gave him a prescription for Ativan 0.5 mg  every 8 hours as needed.  He will return for a follow-up visit with review of CT scans on 05/01/2017. He will contact the office in the interim with any problems. We specifically discussed poorly controlled nausea, diarrhea.  Plan reviewed with Dr. Benay Spice.  Ned Card ANP/GNP-BC   04/17/2017  9:44 AM

## 2017-04-17 NOTE — Patient Instructions (Addendum)
[facility] Discharge Instructions for Patients Receiving Chemotherapy  Today you received the following chemotherapy agents Irinotecan/Leucovorin/5 FU To help prevent nausea and vomiting after your treatment, we encourage you to take your nausea medication as prescribed.   If you develop nausea and vomiting that is not controlled by your nausea medication, call the clinic.   BELOW ARE SYMPTOMS THAT SHOULD BE REPORTED IMMEDIATELY:  *FEVER GREATER THAN 100.5 F  *CHILLS WITH OR WITHOUT FEVER  NAUSEA AND VOMITING THAT IS NOT CONTROLLED WITH YOUR NAUSEA MEDICATION  *UNUSUAL SHORTNESS OF BREATH  *UNUSUAL BRUISING OR BLEEDING  TENDERNESS IN MOUTH AND THROAT WITH OR WITHOUT PRESENCE OF ULCERS  *URINARY PROBLEMS  *BOWEL PROBLEMS  UNUSUAL RASH Items with * indicate a potential emergency and should be followed up as soon as possible.  Feel free to call the clinic you have any questions or concerns. The clinic phone number is [phone].  Please show the [facility] at check-in to the Emergency Department and triage nurse.  Potassium Content of Foods Potassium is a mineral found in many foods and drinks. It helps keep fluids and minerals balanced in your body and affects how steadily your heart beats. Potassium also helps control your blood pressure and keep your muscles and nervous system healthy. Certain health conditions and medicines may change the balance of potassium in your body. When this happens, you can help balance your level of potassium through the foods that you do or do not eat. Your health care provider or dietitian may recommend an amount of potassium that you should have each day. The following lists of foods provide the amount of potassium (in parentheses) per serving in each item. High in potassium The following foods and beverages have 200 mg or more of potassium per serving:  Apricots, 2 raw or 5 dry (200 mg).  Artichoke, 1 medium (345  mg).  Avocado, raw,  each (245 mg).  Banana, 1 medium (425 mg).  Beans, lima, or baked beans, canned,  cup (280 mg).  Beans, white, canned,  cup (595 mg).  Beef roast, 3 oz (320 mg).  Beef, ground, 3 oz (270 mg).  Beets, raw or cooked,  cup (260 mg).  Bran muffin, 2 oz (300 mg).  Broccoli,  cup (230 mg).  Brussels sprouts,  cup (250 mg).  Cantaloupe,  cup (215 mg).  Cereal, 100% bran,  cup (200-400 mg).  Cheeseburger, single, fast food, 1 each (225-400 mg).  Chicken, 3 oz (220 mg).  Clams, canned, 3 oz (535 mg).  Crab, 3 oz (225 mg).  Dates, 5 each (270 mg).  Dried beans and peas,  cup (300-475 mg).  Figs, dried, 2 each (260 mg).  Fish: halibut, tuna, cod, snapper, 3 oz (480 mg).  Fish: salmon, haddock, swordfish, perch, 3 oz (300 mg).  Fish, tuna, canned 3 oz (200 mg).  [place] fries, fast food, 3 oz (470 mg).  Granola with fruit and nuts,  cup (200 mg).  Grapefruit juice,  cup (200 mg).  Greens, beet,  cup (655 mg).  Honeydew melon,  cup (200 mg).  Kale, raw, 1 cup (300 mg).  Kiwi, 1 medium (240 mg).  Kohlrabi, rutabaga, parsnips,  cup (280 mg).  Lentils,  cup (365 mg).  Mango, 1 each (325 mg).  Milk, chocolate, 1 cup (420 mg).  Milk: nonfat, low-fat, whole, buttermilk, 1 cup (350-380 mg).  Molasses, 1 Tbsp (295 mg).  Mushrooms,  cup (280) mg.  Nectarine, 1 each (275 mg).  Nuts: almonds, peanuts, hazelnuts, [place], cashew, mixed, 1 oz (200 mg).  Nuts, pistachios, 1 oz (295 mg).  Orange, 1 each (240 mg).  Orange juice,  cup (235 mg).  Papaya, medium,  fruit (390 mg).  Peanut butter, chunky, 2 Tbsp (240 mg).  Peanut butter, smooth, 2 Tbsp (210 mg).  Pear, 1 medium (200 mg).  Pomegranate, 1 whole (400 mg).  Pomegranate juice,  cup (215 mg).  Pork, 3 oz (350 mg).  Potato chips, salted, 1 oz (465 mg).  Potato, baked with skin, 1 medium (925 mg).  Potatoes, boiled,  cup (255 mg).  Potatoes, mashed,   cup (330 mg).  Prune juice,  cup (370 mg).  Prunes, 5 each (305 mg).  Pudding, chocolate,  cup (230 mg).  Pumpkin, canned,  cup (250 mg).  Raisins, seedless,  cup (270 mg).  Seeds, sunflower or pumpkin, 1 oz (240 mg).  Soy milk, 1 cup (300 mg).  Spinach,  cup (420 mg).  Spinach, canned,  cup (370 mg).  Sweet potato, baked with skin, 1 medium (450 mg).  Swiss chard,  cup (480 mg).  Tomato or vegetable juice,  cup (275 mg).  Tomato sauce or puree,  cup (400-550 mg).  Tomato, raw, 1 medium (290 mg).  Tomatoes, canned,  cup (200-300 mg).  [place], 3 oz (250 mg).  Wheat germ, 1 oz (250 mg).  Winter squash,  cup (250 mg).  Yogurt, plain or fruited, 6 oz (260-435 mg).  Zucchini,  cup (220 mg).  Moderate in potassium The following foods and beverages have 50-200 mg of potassium per serving:  Apple, 1 each (150 mg).  Apple juice,  cup (150 mg).  Applesauce,  cup (90 mg).  Apricot nectar,  cup (140 mg).  Asparagus, small spears,  cup or 6 spears (155 mg).  Bagel, cinnamon raisin, 1 each (130 mg).  Bagel, egg or plain, 4 in., 1 each (70 mg).  Beans, green,  cup (90 mg).  Beans, yellow,  cup (190 mg).  Beer, regular, 12 oz (100 mg).  Beets, canned,  cup (125 mg).  Blackberries,  cup (115 mg).  Blueberries,  cup (60 mg).  Bread, whole wheat, 1 slice (70 mg).  Broccoli, raw,  cup (145 mg).  Cabbage,  cup (150 mg).  Carrots, cooked or raw,  cup (180 mg).  Cauliflower, raw,  cup (150 mg).  Celery, raw,  cup (155 mg).  Cereal, bran flakes, cup (120-150 mg).  Cheese, cottage,  cup (110 mg).  Cherries, 10 each (150 mg).  Chocolate, 1 oz bar (165 mg).  Coffee, brewed 6 oz (90 mg).  Corn,  cup or 1 ear (195 mg).  Cucumbers,  cup (80 mg).  Egg, large, 1 each (60 mg).  Eggplant,  cup (60 mg).  Endive, raw, cup (80 mg).  English muffin, 1 each (65 mg).  Fish, orange roughy, 3 oz (150  mg).  Frankfurter, beef or pork, 1 each (75 mg).  Fruit cocktail,  cup (115 mg).  Grape juice,  cup (170 mg).  Grapefruit,  fruit (175 mg).  Grapes,  cup (155 mg).  Greens: kale, turnip, collard,  cup (110-150 mg).  Ice cream or frozen yogurt, chocolate,  cup (175 mg).  Ice cream or frozen yogurt, vanilla,  cup (120-150 mg).  Lemons, limes, 1 each (80 mg).  Lettuce, all types, 1 cup (100 mg).  Mixed vegetables,  cup (150 mg).  Mushrooms, raw,  cup (110 mg).  Nuts: walnuts, pecans, or macadamia, 1  oz (125 mg).  Oatmeal,  cup (80 mg).  Okra,  cup (110 mg).  Onions, raw,  cup (120 mg).  Peach, 1 each (185 mg).  Peaches, canned,  cup (120 mg).  Pears, canned,  cup (120 mg).  Peas, green, frozen,  cup (90 mg).  Peppers, green,  cup (130 mg).  Peppers, red,  cup (160 mg).  Pineapple juice,  cup (165 mg).  Pineapple, fresh or canned,  cup (100 mg).  Plums, 1 each (105 mg).  Pudding, vanilla,  cup (150 mg).  Raspberries,  cup (90 mg).  Rhubarb,  cup (115 mg).  Rice, wild,  cup (80 mg).  Shrimp, 3 oz (155 mg).  Spinach, raw, 1 cup (170 mg).  Strawberries,  cup (125 mg).  Summer squash  cup (175-200 mg).  Swiss chard, raw, 1 cup (135 mg).  Tangerines, 1 each (140 mg).  Tea, brewed, 6 oz (65 mg).  Turnips,  cup (140 mg).  Watermelon,  cup (85 mg).  Wine, red, table, 5 oz (180 mg).  Wine, white, table, 5 oz (100 mg).  Low in potassium The following foods and beverages have less than 50 mg of potassium per serving.  Bread, white, 1 slice (30 mg).  Carbonated beverages, 12 oz (less than 5 mg).  Cheese, 1 oz (20-30 mg).  Cranberries,  cup (45 mg).  Cranberry juice cocktail,  cup (20 mg).  Fats and oils, 1 Tbsp (less than 5 mg).  Hummus, 1 Tbsp (32 mg).  Nectar: papaya, mango, or pear,  cup (35 mg).  Rice, white or brown,  cup (50 mg).  Spaghetti or macaroni,  cup cooked (30 mg).  Tortilla, flour  or corn, 1 each (50 mg).  Waffle, 4 in., 1 each (50 mg).  Water chestnuts,  cup (40 mg).  This information is not intended to replace advice given to you by your health care provider. Make sure you discuss any questions you have with your health care provider. Document Released: 04/24/2005 Document Revised: 02/16/2016 Document Reviewed: 08/07/2013 Elsevier Interactive Patient Education  [employer].

## 2017-04-19 ENCOUNTER — Other Ambulatory Visit: Payer: Self-pay | Admitting: *Deleted

## 2017-04-19 ENCOUNTER — Ambulatory Visit (HOSPITAL_BASED_OUTPATIENT_CLINIC_OR_DEPARTMENT_OTHER): Payer: BLUE CROSS/BLUE SHIELD

## 2017-04-19 VITALS — BP 140/85 | HR 79 | Temp 97.8°F | Resp 18

## 2017-04-19 DIAGNOSIS — C160 Malignant neoplasm of cardia: Secondary | ICD-10-CM

## 2017-04-19 MED ORDER — HEPARIN SOD (PORK) LOCK FLUSH 100 UNIT/ML IV SOLN
500.0000 [IU] | Freq: Once | INTRAVENOUS | Status: AC | PRN
  Administered 2017-04-19: 500 [IU]
  Filled 2017-04-19: qty 5

## 2017-04-19 MED ORDER — POTASSIUM CHLORIDE CRYS ER 10 MEQ PO TBCR: 10.0000 meq | EXTENDED_RELEASE_TABLET | Freq: Two times a day (BID) | ORAL | 0 refills | Status: DC

## 2017-04-19 MED ORDER — SODIUM CHLORIDE 0.9% FLUSH
10.0000 mL | INTRAVENOUS | Status: DC | PRN
  Administered 2017-04-19: 10 mL
  Filled 2017-04-19: qty 10

## 2017-04-26 ENCOUNTER — Telehealth: Payer: Self-pay | Admitting: *Deleted

## 2017-04-26 NOTE — Telephone Encounter (Signed)
Message from S/O voicing frustration (with expletives) about CT not being scheduled. It is ordered to be done 8/6. Message to managed care requesting prior auth to be expedited.

## 2017-04-26 NOTE — Telephone Encounter (Signed)
Received message CT Josem Kaufmann is complete. Notified radiology to contact pt.

## 2017-04-28 ENCOUNTER — Other Ambulatory Visit: Payer: Self-pay | Admitting: Oncology

## 2017-04-29 ENCOUNTER — Ambulatory Visit (HOSPITAL_COMMUNITY)
Admission: RE | Admit: 2017-04-29 | Discharge: 2017-04-29 | Disposition: A | Discharge status: Home / Self Care | Payer: BLUE CROSS/BLUE SHIELD | Source: Ambulatory Visit | Attending: Nurse Practitioner | Admitting: Nurse Practitioner

## 2017-04-29 ENCOUNTER — Encounter (HOSPITAL_COMMUNITY): Payer: Self-pay

## 2017-04-29 DIAGNOSIS — C779 Secondary and unspecified malignant neoplasm of lymph node, unspecified: Secondary | ICD-10-CM | POA: Diagnosis not present

## 2017-04-29 DIAGNOSIS — C160 Malignant neoplasm of cardia: Secondary | ICD-10-CM | POA: Diagnosis not present

## 2017-04-29 MED ORDER — IOPAMIDOL (ISOVUE-300) INJECTION 61%
75.0000 mL | Freq: Once | INTRAVENOUS | Status: AC | PRN
  Administered 2017-04-29: 75 mL via INTRAVENOUS

## 2017-04-29 MED ORDER — IOPAMIDOL (ISOVUE-300) INJECTION 61%
INTRAVENOUS | Status: AC
  Filled 2017-04-29: qty 75

## 2017-04-30 ENCOUNTER — Other Ambulatory Visit: Payer: Self-pay | Admitting: *Deleted

## 2017-04-30 DIAGNOSIS — C160 Malignant neoplasm of cardia: Secondary | ICD-10-CM

## 2017-04-30 MED ORDER — POTASSIUM CHLORIDE CRYS ER 10 MEQ PO TBCR: 10.0000 meq | EXTENDED_RELEASE_TABLET | Freq: Two times a day (BID) | ORAL | 0 refills | Status: DC

## 2017-05-01 ENCOUNTER — Ambulatory Visit: Payer: BLUE CROSS/BLUE SHIELD

## 2017-05-01 ENCOUNTER — Ambulatory Visit (HOSPITAL_BASED_OUTPATIENT_CLINIC_OR_DEPARTMENT_OTHER): Payer: BLUE CROSS/BLUE SHIELD

## 2017-05-01 ENCOUNTER — Telehealth: Payer: Self-pay | Admitting: Nurse Practitioner

## 2017-05-01 ENCOUNTER — Other Ambulatory Visit (HOSPITAL_BASED_OUTPATIENT_CLINIC_OR_DEPARTMENT_OTHER): Payer: BLUE CROSS/BLUE SHIELD

## 2017-05-01 ENCOUNTER — Ambulatory Visit (HOSPITAL_BASED_OUTPATIENT_CLINIC_OR_DEPARTMENT_OTHER): Payer: BLUE CROSS/BLUE SHIELD | Admitting: Nurse Practitioner

## 2017-05-01 VITALS — BP 137/80 | HR 83 | Temp 97.7°F | Resp 18 | Ht 66.0 in | Wt 170.0 lb

## 2017-05-01 DIAGNOSIS — C160 Malignant neoplasm of cardia: Secondary | ICD-10-CM

## 2017-05-01 DIAGNOSIS — E119 Type 2 diabetes mellitus without complications: Secondary | ICD-10-CM | POA: Diagnosis not present

## 2017-05-01 DIAGNOSIS — C778 Secondary and unspecified malignant neoplasm of lymph nodes of multiple regions: Secondary | ICD-10-CM | POA: Diagnosis not present

## 2017-05-01 DIAGNOSIS — I10 Essential (primary) hypertension: Secondary | ICD-10-CM

## 2017-05-01 DIAGNOSIS — Z5111 Encounter for antineoplastic chemotherapy: Secondary | ICD-10-CM

## 2017-05-01 DIAGNOSIS — R110 Nausea: Secondary | ICD-10-CM

## 2017-05-01 DIAGNOSIS — G620 Drug-induced polyneuropathy: Secondary | ICD-10-CM | POA: Diagnosis not present

## 2017-05-01 LAB — CBC WITH DIFFERENTIAL/PLATELET
BASO%: 0.3 % (ref 0.0–2.0)
Basophils Absolute: 0 10*3/uL (ref 0.0–0.1)
EOS ABS: 0.1 10*3/uL (ref 0.0–0.5)
EOS%: 1.9 % (ref 0.0–7.0)
HCT: 35.7 % — ABNORMAL LOW (ref 38.4–49.9)
HGB: 12.2 g/dL — ABNORMAL LOW (ref 13.0–17.1)
LYMPH%: 18.9 % (ref 14.0–49.0)
MCH: 30.1 pg (ref 27.2–33.4)
MCHC: 34.2 g/dL (ref 32.0–36.0)
MCV: 88.1 fL (ref 79.3–98.0)
MONO#: 0.6 10*3/uL (ref 0.1–0.9)
MONO%: 9.5 % (ref 0.0–14.0)
NEUT%: 69.4 % (ref 39.0–75.0)
NEUTROS ABS: 4.1 10*3/uL (ref 1.5–6.5)
Platelets: 161 10*3/uL (ref 140–400)
RBC: 4.05 10*6/uL — AB (ref 4.20–5.82)
RDW: 16.4 % — ABNORMAL HIGH (ref 11.0–14.6)
WBC: 5.9 10*3/uL (ref 4.0–10.3)
lymph#: 1.1 10*3/uL (ref 0.9–3.3)

## 2017-05-01 LAB — COMPREHENSIVE METABOLIC PANEL
ALT: 46 U/L (ref 0–55)
AST: 33 U/L (ref 5–34)
Albumin: 3.6 g/dL (ref 3.5–5.0)
Alkaline Phosphatase: 99 U/L (ref 40–150)
Anion Gap: 12 mEq/L — ABNORMAL HIGH (ref 3–11)
BUN: 7.5 mg/dL (ref 7.0–26.0)
CO2: 23 meq/L (ref 22–29)
Calcium: 9.5 mg/dL (ref 8.4–10.4)
Chloride: 107 mEq/L (ref 98–109)
Creatinine: 0.8 mg/dL (ref 0.7–1.3)
GLUCOSE: 217 mg/dL — AB (ref 70–140)
POTASSIUM: 3.8 meq/L (ref 3.5–5.1)
SODIUM: 142 meq/L (ref 136–145)
Total Bilirubin: 0.56 mg/dL (ref 0.20–1.20)
Total Protein: 6.4 g/dL (ref 6.4–8.3)

## 2017-05-01 MED ORDER — SODIUM CHLORIDE 0.9 % IV SOLN
180.0000 mg/m2 | Freq: Once | INTRAVENOUS | Status: AC
  Administered 2017-05-01: 340 mg via INTRAVENOUS
  Filled 2017-05-01: qty 15

## 2017-05-01 MED ORDER — SODIUM CHLORIDE 0.9 % IV SOLN
Freq: Once | INTRAVENOUS | Status: AC
  Administered 2017-05-01: 11:00:00 via INTRAVENOUS

## 2017-05-01 MED ORDER — FLUOROURACIL CHEMO INJECTION 2.5 GM/50ML
400.0000 mg/m2 | Freq: Once | INTRAVENOUS | Status: AC
  Administered 2017-05-01: 750 mg via INTRAVENOUS
  Filled 2017-05-01: qty 15

## 2017-05-01 MED ORDER — SODIUM CHLORIDE 0.9 % IV SOLN
Freq: Once | INTRAVENOUS | Status: AC
  Administered 2017-05-01: 11:00:00 via INTRAVENOUS
  Filled 2017-05-01: qty 5

## 2017-05-01 MED ORDER — PALONOSETRON HCL INJECTION 0.25 MG/5ML
0.2500 mg | Freq: Once | INTRAVENOUS | Status: AC
  Administered 2017-05-01: 0.25 mg via INTRAVENOUS

## 2017-05-01 MED ORDER — ATROPINE SULFATE 1 MG/ML IJ SOLN
0.5000 mg | Freq: Once | INTRAMUSCULAR | Status: AC | PRN
  Administered 2017-05-01: 0.5 mg via INTRAVENOUS

## 2017-05-01 MED ORDER — SODIUM CHLORIDE 0.9 % IV SOLN
400.0000 mg/m2 | Freq: Once | INTRAVENOUS | Status: AC
  Administered 2017-05-01: 764 mg via INTRAVENOUS
  Filled 2017-05-01: qty 38.2

## 2017-05-01 MED ORDER — PALONOSETRON HCL INJECTION 0.25 MG/5ML
INTRAVENOUS | Status: AC
  Filled 2017-05-01: qty 5

## 2017-05-01 MED ORDER — SODIUM CHLORIDE 0.9% FLUSH
10.0000 mL | INTRAVENOUS | Status: DC | PRN
  Filled 2017-05-01: qty 10

## 2017-05-01 MED ORDER — ATROPINE SULFATE 1 MG/ML IJ SOLN
INTRAMUSCULAR | Status: AC
Start: 2017-05-01 — End: 2017-05-01
  Filled 2017-05-01: qty 1

## 2017-05-01 MED ORDER — SODIUM CHLORIDE 0.9 % IV SOLN
2400.0000 mg/m2 | INTRAVENOUS | Status: DC
  Administered 2017-05-01: 4600 mg via INTRAVENOUS
  Filled 2017-05-01: qty 92

## 2017-05-01 NOTE — Progress Notes (Addendum)
[facility] OFFICE PROGRESS NOTE   Diagnosis:  Gastric cancer  INTERVAL HISTORY:   Robert Beck returns as scheduled. He completed cycle 5 FOLFIRI 04/17/2017. He had nausea beginning day 2. The nausea lasted for about a week. Ativan did not help. He notes improvement if he takes Zofran and Compazine together. No mouth sores. No significant diarrhea. He takes Imodium if needed.  Objective:  Vital signs in last 24 hours:  Blood pressure 137/80, pulse 83, temperature 97.7 F (36.5 C), temperature source Oral, resp. rate 18, height '5\' 6"'  (1.676 m), weight 170 lb (77.1 kg), SpO2 100 %.    HEENT: No thrush or ulcers. Lymphatics: No palpable cervical or supra-clavicular lymph nodes. Resp: Lungs clear bilaterally. Cardio: Regular rate and rhythm. GI: Abdomen soft and nontender. No hepatosplenomegaly. Vascular: No leg edema. Skin: No rash. Port-A-Cath without erythema.    Lab Results:  Lab Results  Component Value Date   WBC 5.9 05/01/2017   HGB 12.2 (L) 05/01/2017   HCT 35.7 (L) 05/01/2017   MCV 88.1 05/01/2017   PLT 161 05/01/2017   NEUTROABS 4.1 05/01/2017    Imaging:  No results found.  Medications: I have reviewed the patient's current medications.  Assessment/Plan: 1. Gastric cancer, gastric cardia mass standing to the GE junction confirmed on endoscopy 04/11/2016, biopsy confirmed adenocarcinoma  Staging CTs of the chest, abdomen, and pelvis 04/09/2016-gastric cardia mass, mediastinal, right hilar, and right supraclavicular lymphadenopathy  Biopsy of a right supraclavicular lymph node on 04/20/2016 revealed metastatic adenocarcinoma  FoundationACT-TP53 alteration identified  Cycle 1 FOLFOX 04/30/2016 (oxaliplatin given 04/30/2016, 5-FU infusion started 05/02/2016)  Cycle 2 FOLFOX 05/14/2016  Cycle 3 FOLFOX 05/30/2016  Cycle 4 FOLFOX 06/13/2016  Cycle 5 FOLFOX 06/27/2016  Cycle 6 FOLFOX 07/11/2016  CTs 07/20/2016-decrease in  supraclavicular, mediastinal, and gastrohepatic adenopathy. Decreased gastric cardia wall thickening.  Cycle 7 FOLFOX 07/25/2016  Cycle 8 FOLFOX 08/08/2016  Cycle 9 FOLFOX 08/22/2016 (oxaliplatin held due to neuropathy)  Cycle 10 FOLFOX 09/05/2016 (oxaliplatin held)  Cycle 11 FOLFOX 09/26/2016 (oxaliplatin held)  Cycle 12 FOLFOX 10/10/2016 (oxaliplatin held)  CT 10/18/2016-mild enlargement of mediastinal lymph nodes, no other evidence of disease progression, stable thickening at the GE junction and stable gastrohepatic lymphadenopathy  Cycle 13 FOLFOX 10/24/2016 (oxaliplatin held)  Maintenance Xeloda 7 days on/7 days off beginning 11/17/2016  Restaging chest CT 01/22/2017-interval progression of metastatic lymphadenopathy in the mediastinum and gastrohepatic ligament.  Salvage therapy with FOLFIRI initiated 02/20/2017  Cycle 5 FOLFIRI 04/17/2017  Restaging chest CT 04/29/2017-mild improvement in mediastinal adenopathy. No significant change in adenopathy in the gastrohepatic ligament. No evidence of disease progression.  2. Anemia secondary to #1-improved  3. Diabetes  4. Gout  5. Hypertension  6. Hyperlipidemia  7. History of a T7 compression fracture  8. Pain secondary to the gastric mass and mediastinal lymphadenopathy-resolved  9. Port-A-Cath placement 04/20/2016  10. 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 1 mm obstructing stone distal right ureter/right ureteral vesicle junction on CT 09/13/2016.  13. 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 Hypercalcemia 12/19/2016-asymptomatic  15. Nausea following FOLFIRI-emend added beginning with cycle 3    Disposition: Mr. Knaak appears stable. He has completed 5 cycles of FOLFIRI. The recent  restaging chest CT showed stable to improved disease. Plan to proceed with cycle 6 FOLFIRI today as scheduled.  He is having delayed nausea despite Aloxi/Emend/dexamethasone day  1. We discussed oral steroids for a few days following treatment. He would like to hold on this for now and continue working with his home antiemetics. He will contact the office if he has nausea affecting his ability to eat and drink.  He will return for follow-up visit and cycle 7 FOLFIRI in 2 weeks. He will contact the office in the interim as outlined above or with any other problems.  Patient seen with Dr. Benay Spice.    Ned Card ANP/GNP-BC   05/01/2017  9:49 AM  This was a shared visit with Ned Card. Mr. Alred has completed 5 cycles of FOLFIRI. The restaging CT reveals  improvement in the chest adenopathy. No evidence of progressive disease. The plan is to continue FOLFIRI. He does not wish to change the anti-emetic regimen at present.  LostineD.

## 2017-05-01 NOTE — Telephone Encounter (Signed)
Scheduled appt per 8/8 los - Gave patient AVS and calender per los. 

## 2017-05-01 NOTE — Patient Instructions (Signed)
[facility] Discharge Instructions for Patients Receiving Chemotherapy  Today you received the following chemotherapy agents Ironitecan, leucovorin and adrucil  To help prevent nausea and vomiting after your treatment, we encourage you to take your nausea medication : as directed  If you develop nausea and vomiting that is not controlled by your nausea medication, call the clinic.   BELOW ARE SYMPTOMS THAT SHOULD BE REPORTED IMMEDIATELY:  *FEVER GREATER THAN 100.5 F  *CHILLS WITH OR WITHOUT FEVER  NAUSEA AND VOMITING THAT IS NOT CONTROLLED WITH YOUR NAUSEA MEDICATION  *UNUSUAL SHORTNESS OF BREATH  *UNUSUAL BRUISING OR BLEEDING  TENDERNESS IN MOUTH AND THROAT WITH OR WITHOUT PRESENCE OF ULCERS  *URINARY PROBLEMS  *BOWEL PROBLEMS  UNUSUAL RASH Items with * indicate a potential emergency and should be followed up as soon as possible.  Feel free to call the clinic you have any questions or concerns. The clinic phone number is [phone].  Please show the [facility] at check-in to the Emergency Department and triage nurse.

## 2017-05-03 ENCOUNTER — Ambulatory Visit (HOSPITAL_BASED_OUTPATIENT_CLINIC_OR_DEPARTMENT_OTHER): Payer: BLUE CROSS/BLUE SHIELD

## 2017-05-03 VITALS — BP 133/82 | HR 87 | Temp 98.3°F | Resp 18

## 2017-05-03 DIAGNOSIS — C160 Malignant neoplasm of cardia: Secondary | ICD-10-CM | POA: Diagnosis not present

## 2017-05-03 MED ORDER — HEPARIN SOD (PORK) LOCK FLUSH 100 UNIT/ML IV SOLN
500.0000 [IU] | Freq: Once | INTRAVENOUS | Status: AC | PRN
  Administered 2017-05-03: 500 [IU]
  Filled 2017-05-03: qty 5

## 2017-05-03 MED ORDER — SODIUM CHLORIDE 0.9% FLUSH
10.0000 mL | INTRAVENOUS | Status: DC | PRN
  Administered 2017-05-03: 10 mL
  Filled 2017-05-03: qty 10

## 2017-05-12 ENCOUNTER — Other Ambulatory Visit: Payer: Self-pay | Admitting: Oncology

## 2017-05-14 ENCOUNTER — Other Ambulatory Visit: Payer: Self-pay | Admitting: *Deleted

## 2017-05-14 MED ORDER — PROCHLORPERAZINE MALEATE 10 MG PO TABS: 10.0000 mg | ORAL_TABLET | Freq: Four times a day (QID) | ORAL | 1 refills | Status: DC | PRN

## 2017-05-15 ENCOUNTER — Ambulatory Visit (HOSPITAL_BASED_OUTPATIENT_CLINIC_OR_DEPARTMENT_OTHER): Payer: BLUE CROSS/BLUE SHIELD

## 2017-05-15 ENCOUNTER — Ambulatory Visit (HOSPITAL_BASED_OUTPATIENT_CLINIC_OR_DEPARTMENT_OTHER): Payer: BLUE CROSS/BLUE SHIELD | Admitting: Oncology

## 2017-05-15 ENCOUNTER — Telehealth: Payer: Self-pay | Admitting: Oncology

## 2017-05-15 ENCOUNTER — Ambulatory Visit: Payer: BLUE CROSS/BLUE SHIELD

## 2017-05-15 ENCOUNTER — Other Ambulatory Visit (HOSPITAL_BASED_OUTPATIENT_CLINIC_OR_DEPARTMENT_OTHER): Payer: BLUE CROSS/BLUE SHIELD

## 2017-05-15 VITALS — BP 137/96 | HR 97 | Temp 97.4°F | Resp 18

## 2017-05-15 VITALS — BP 153/96 | HR 93 | Temp 97.8°F | Resp 20 | Ht 66.0 in | Wt 169.8 lb

## 2017-05-15 DIAGNOSIS — C160 Malignant neoplasm of cardia: Secondary | ICD-10-CM

## 2017-05-15 DIAGNOSIS — C778 Secondary and unspecified malignant neoplasm of lymph nodes of multiple regions: Secondary | ICD-10-CM | POA: Diagnosis not present

## 2017-05-15 DIAGNOSIS — Z5111 Encounter for antineoplastic chemotherapy: Secondary | ICD-10-CM

## 2017-05-15 DIAGNOSIS — I10 Essential (primary) hypertension: Secondary | ICD-10-CM

## 2017-05-15 DIAGNOSIS — Z95828 Presence of other vascular implants and grafts: Secondary | ICD-10-CM

## 2017-05-15 LAB — CBC WITH DIFFERENTIAL/PLATELET
BASO%: 0.3 % (ref 0.0–2.0)
Basophils Absolute: 0 10*3/uL (ref 0.0–0.1)
EOS%: 2.3 % (ref 0.0–7.0)
Eosinophils Absolute: 0.1 10*3/uL (ref 0.0–0.5)
HCT: 35.5 % — ABNORMAL LOW (ref 38.4–49.9)
HGB: 11.9 g/dL — ABNORMAL LOW (ref 13.0–17.1)
LYMPH%: 21 % (ref 14.0–49.0)
MCH: 29.8 pg (ref 27.2–33.4)
MCHC: 33.5 g/dL (ref 32.0–36.0)
MCV: 89 fL (ref 79.3–98.0)
MONO#: 0.5 10*3/uL (ref 0.1–0.9)
MONO%: 8.5 % (ref 0.0–14.0)
NEUT%: 67.9 % (ref 39.0–75.0)
NEUTROS ABS: 4.1 10*3/uL (ref 1.5–6.5)
PLATELETS: 163 10*3/uL (ref 140–400)
RBC: 3.99 10*6/uL — AB (ref 4.20–5.82)
RDW: 15.7 % — ABNORMAL HIGH (ref 11.0–14.6)
WBC: 6.1 10*3/uL (ref 4.0–10.3)
lymph#: 1.3 10*3/uL (ref 0.9–3.3)

## 2017-05-15 LAB — COMPREHENSIVE METABOLIC PANEL
ALT: 30 U/L (ref 0–55)
ANION GAP: 8 meq/L (ref 3–11)
AST: 23 U/L (ref 5–34)
Albumin: 3.6 g/dL (ref 3.5–5.0)
Alkaline Phosphatase: 97 U/L (ref 40–150)
BILIRUBIN TOTAL: 0.52 mg/dL (ref 0.20–1.20)
BUN: 9.3 mg/dL (ref 7.0–26.0)
CHLORIDE: 107 meq/L (ref 98–109)
CO2: 26 meq/L (ref 22–29)
Calcium: 9.7 mg/dL (ref 8.4–10.4)
Creatinine: 0.8 mg/dL (ref 0.7–1.3)
GLUCOSE: 162 mg/dL — AB (ref 70–140)
POTASSIUM: 3.9 meq/L (ref 3.5–5.1)
SODIUM: 142 meq/L (ref 136–145)
Total Protein: 6.7 g/dL (ref 6.4–8.3)

## 2017-05-15 MED ORDER — PROCHLORPERAZINE MALEATE 10 MG PO TABS
ORAL_TABLET | ORAL | Status: AC
  Filled 2017-05-15: qty 1

## 2017-05-15 MED ORDER — SODIUM CHLORIDE 0.9 % IV SOLN
Freq: Once | INTRAVENOUS | Status: AC
  Administered 2017-05-15: 11:00:00 via INTRAVENOUS

## 2017-05-15 MED ORDER — SODIUM CHLORIDE 0.9% FLUSH
10.0000 mL | INTRAVENOUS | Status: DC | PRN
  Administered 2017-05-15: 10 mL via INTRAVENOUS
  Filled 2017-05-15: qty 10

## 2017-05-15 MED ORDER — SODIUM CHLORIDE 0.9 % IV SOLN
2400.0000 mg/m2 | INTRAVENOUS | Status: DC
  Administered 2017-05-15: 4600 mg via INTRAVENOUS
  Filled 2017-05-15: qty 92

## 2017-05-15 MED ORDER — PROCHLORPERAZINE MALEATE 10 MG PO TABS
10.0000 mg | ORAL_TABLET | Freq: Four times a day (QID) | ORAL | Status: DC | PRN
  Administered 2017-05-15: 10 mg via ORAL

## 2017-05-15 MED ORDER — PALONOSETRON HCL INJECTION 0.25 MG/5ML
0.2500 mg | Freq: Once | INTRAVENOUS | Status: AC
  Administered 2017-05-15: 0.25 mg via INTRAVENOUS

## 2017-05-15 MED ORDER — ATROPINE SULFATE 1 MG/ML IJ SOLN
INTRAMUSCULAR | Status: AC
  Filled 2017-05-15: qty 1

## 2017-05-15 MED ORDER — PALONOSETRON HCL INJECTION 0.25 MG/5ML
INTRAVENOUS | Status: AC
  Filled 2017-05-15: qty 5

## 2017-05-15 MED ORDER — SODIUM CHLORIDE 0.9 % IV SOLN
Freq: Once | INTRAVENOUS | Status: AC
  Administered 2017-05-15: 11:00:00 via INTRAVENOUS
  Filled 2017-05-15: qty 5

## 2017-05-15 MED ORDER — ATROPINE SULFATE 1 MG/ML IJ SOLN
0.5000 mg | Freq: Once | INTRAMUSCULAR | Status: AC | PRN
  Administered 2017-05-15: 0.5 mg via INTRAVENOUS

## 2017-05-15 MED ORDER — DEXTROSE 5 % IV SOLN
400.0000 mg/m2 | Freq: Once | INTRAVENOUS | Status: AC
  Administered 2017-05-15: 764 mg via INTRAVENOUS
  Filled 2017-05-15: qty 38.2

## 2017-05-15 MED ORDER — IRINOTECAN HCL CHEMO INJECTION 100 MG/5ML
180.0000 mg/m2 | Freq: Once | INTRAVENOUS | Status: AC
  Administered 2017-05-15: 340 mg via INTRAVENOUS
  Filled 2017-05-15: qty 15

## 2017-05-15 MED ORDER — HEPARIN SOD (PORK) LOCK FLUSH 100 UNIT/ML IV SOLN
500.0000 [IU] | Freq: Once | INTRAVENOUS | Status: AC | PRN
  Administered 2017-05-15: 500 [IU] via INTRAVENOUS
  Filled 2017-05-15: qty 5

## 2017-05-15 MED ORDER — FLUOROURACIL CHEMO INJECTION 2.5 GM/50ML
400.0000 mg/m2 | Freq: Once | INTRAVENOUS | Status: AC
  Administered 2017-05-15: 750 mg via INTRAVENOUS
  Filled 2017-05-15: qty 15

## 2017-05-15 NOTE — Patient Instructions (Signed)
[facility] Discharge Instructions for Patients Receiving Chemotherapy  Today you received the following chemotherapy agents:  Irinotecan, Leucovorin, and 5FU.  To help prevent nausea and vomiting after your treatment, we encourage you to take your nausea medication as directed.   If you develop nausea and vomiting that is not controlled by your nausea medication, call the clinic.   BELOW ARE SYMPTOMS THAT SHOULD BE REPORTED IMMEDIATELY:  *FEVER GREATER THAN 100.5 F  *CHILLS WITH OR WITHOUT FEVER  NAUSEA AND VOMITING THAT IS NOT CONTROLLED WITH YOUR NAUSEA MEDICATION  *UNUSUAL SHORTNESS OF BREATH  *UNUSUAL BRUISING OR BLEEDING  TENDERNESS IN MOUTH AND THROAT WITH OR WITHOUT PRESENCE OF ULCERS  *URINARY PROBLEMS  *BOWEL PROBLEMS  UNUSUAL RASH Items with * indicate a potential emergency and should be followed up as soon as possible.  Feel free to call the clinic you have any questions or concerns. The clinic phone number is [phone].  Please show the [facility] at check-in to the Emergency Department and triage nurse.   

## 2017-05-15 NOTE — Telephone Encounter (Signed)
Scheduled appt pe r8/22 los - Gave patient AVS and calender per los.  

## 2017-05-15 NOTE — Progress Notes (Signed)
[facility] OFFICE PROGRESS NOTE   Diagnosis: Gastric cancer  INTERVAL HISTORY:   Mr. Arriaga completed another cycle of FOLFIRI on 05/01/2017. He reports better control of nausea following this cycle. He used Zofran and Compazine. No pain or dysphagia. Neuropathy symptoms are slowly improving.  Objective:  Vital signs in last 24 hours:  Blood pressure (!) 153/96, pulse 93, temperature 97.8 F (36.6 C), temperature source Oral, resp. rate 20, height '5\' 6"'  (1.676 m), weight 169 lb 12.8 oz (77 kg), SpO2 96 %.    HEENT: No thrush or ulcers Resp: Coarse rhonchi at the posterior base bilaterally, no respiratory distress Cardio: Regular rate and rhythm GI: No hepatosplenomegaly, nontender Vascular: No leg edema   Portacath/PICC-without erythema  Lab Results:  Lab Results  Component Value Date   WBC 6.1 05/15/2017   HGB 11.9 (L) 05/15/2017   HCT 35.5 (L) 05/15/2017   MCV 89.0 05/15/2017   PLT 163 05/15/2017   NEUTROABS 4.1 05/15/2017    CMP     Component Value Date/Time   NA 142 05/01/2017 0859   K 3.8 05/01/2017 0859   CL 105 09/13/2016 1000   CO2 23 05/01/2017 0859   GLUCOSE 217 (H) 05/01/2017 0859   BUN 7.5 05/01/2017 0859   CREATININE 0.8 05/01/2017 0859   CALCIUM 9.5 05/01/2017 0859   PROT 6.4 05/01/2017 0859   ALBUMIN 3.6 05/01/2017 0859   AST 33 05/01/2017 0859   ALT 46 05/01/2017 0859   ALKPHOS 99 05/01/2017 0859   BILITOT 0.56 05/01/2017 0859   GFRNONAA >60 09/13/2016 1000   GFRAA >60 09/13/2016 1000    Medications: I have reviewed the patient's current medications.  Assessment/Plan: 1. Gastric cancer, gastric cardia mass extending to the GE junction confirmed on endoscopy 04/11/2016, biopsy confirmed adenocarcinoma  Staging CTs of the chest, abdomen, and pelvis 04/09/2016-gastric cardia mass, mediastinal, right hilar, and right supraclavicular lymphadenopathy  Biopsy of a right supraclavicular lymph node on 04/20/2016 revealed  metastatic adenocarcinoma  FoundationACT-TP53 alteration identified  Cycle 1 FOLFOX 04/30/2016 (oxaliplatin given 04/30/2016, 5-FU infusion started 05/02/2016)  Cycle 2 FOLFOX 05/14/2016  Cycle 3 FOLFOX 05/30/2016  Cycle 4 FOLFOX 06/13/2016  Cycle 5 FOLFOX 06/27/2016  Cycle 6 FOLFOX 07/11/2016  CTs 07/20/2016-decrease in supraclavicular, mediastinal, and gastrohepatic adenopathy. Decreased gastric cardia wall thickening.  Cycle 7 FOLFOX 07/25/2016  Cycle 8 FOLFOX 08/08/2016  Cycle 9 FOLFOX 08/22/2016 (oxaliplatin held due to neuropathy)  Cycle 10 FOLFOX 09/05/2016 (oxaliplatin held)  Cycle 11 FOLFOX 09/26/2016 (oxaliplatin held)  Cycle 12 FOLFOX 10/10/2016 (oxaliplatin held)  CT 10/18/2016-mild enlargement of mediastinal lymph nodes, no other evidence of disease progression, stable thickening at the GE junction and stable gastrohepatic lymphadenopathy  Cycle 13 FOLFOX 10/24/2016 (oxaliplatin held)  Maintenance Xeloda 7 days on/7 days off beginning 11/17/2016  Restaging chest CT 01/22/2017-interval progression of metastatic lymphadenopathy in the mediastinum and gastrohepatic ligament.  Salvage therapy with FOLFIRI initiated 02/20/2017  Cycle 5 FOLFIRI 04/17/2017  Restaging chest CT 04/29/2017-mild improvement in mediastinal adenopathy. No significant change in adenopathy in the gastrohepatic ligament. No evidence of disease progression.  Cycle 6 FOLFIRI 05/01/2017  Cycle 7 FOLFIRI 05/15/2017  2. Anemia secondary to #1-improved  3. Diabetes  4. Gout  5. Hypertension  6. Hyperlipidemia  7. History of a T7 compression fracture  8. Pain secondary to the gastric mass and mediastinal lymphadenopathy-resolved  9. Port-A-Cath placement 04/20/2016  10. 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 1 mm obstructing stone distal right ureter/right  ureteral vesicle junction on CT 09/13/2016.  13. 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 Hypercalcemia 12/19/2016-asymptomatic  15. Nausea following FOLFIRI-emend added beginning with cycle 3    Disposition:  Robert Beck appears stable. He tolerated the last cycle of chemotherapy well. The plan is to proceed with another cycle of FOLFIRI today. He will return for an office visit and FOLFIRI in 2 weeks.  His blood pressure is higher today. He has been maintained off of olmesartan/HCTZ. He will resume this combination at half of the previous dose.  15 minutes were spent with the patient today. The majority of the time was used for counseling and coordination of care.  Donneta Romberg, MD  05/15/2017  10:14 AM

## 2017-05-17 ENCOUNTER — Ambulatory Visit (HOSPITAL_BASED_OUTPATIENT_CLINIC_OR_DEPARTMENT_OTHER): Payer: BLUE CROSS/BLUE SHIELD

## 2017-05-17 VITALS — BP 141/92 | HR 79 | Temp 97.8°F | Resp 18

## 2017-05-17 DIAGNOSIS — Z95828 Presence of other vascular implants and grafts: Secondary | ICD-10-CM

## 2017-05-17 DIAGNOSIS — C160 Malignant neoplasm of cardia: Secondary | ICD-10-CM

## 2017-05-17 MED ORDER — SODIUM CHLORIDE 0.9% FLUSH
10.0000 mL | INTRAVENOUS | Status: DC | PRN
  Administered 2017-05-17: 10 mL via INTRAVENOUS
  Filled 2017-05-17: qty 10

## 2017-05-17 MED ORDER — HEPARIN SOD (PORK) LOCK FLUSH 100 UNIT/ML IV SOLN
500.0000 [IU] | Freq: Once | INTRAVENOUS | Status: AC | PRN
  Administered 2017-05-17: 500 [IU] via INTRAVENOUS
  Filled 2017-05-17: qty 5

## 2017-05-17 NOTE — Patient Instructions (Signed)

## 2017-05-28 ENCOUNTER — Other Ambulatory Visit: Payer: Self-pay | Admitting: Oncology

## 2017-05-29 ENCOUNTER — Ambulatory Visit: Payer: BLUE CROSS/BLUE SHIELD

## 2017-05-29 ENCOUNTER — Other Ambulatory Visit (HOSPITAL_BASED_OUTPATIENT_CLINIC_OR_DEPARTMENT_OTHER): Payer: BLUE CROSS/BLUE SHIELD

## 2017-05-29 ENCOUNTER — Ambulatory Visit (HOSPITAL_BASED_OUTPATIENT_CLINIC_OR_DEPARTMENT_OTHER): Payer: BLUE CROSS/BLUE SHIELD

## 2017-05-29 ENCOUNTER — Telehealth: Payer: Self-pay | Admitting: Oncology

## 2017-05-29 ENCOUNTER — Ambulatory Visit (HOSPITAL_BASED_OUTPATIENT_CLINIC_OR_DEPARTMENT_OTHER): Payer: BLUE CROSS/BLUE SHIELD | Admitting: Oncology

## 2017-05-29 VITALS — BP 117/80 | HR 101 | Temp 98.7°F | Wt 167.8 lb

## 2017-05-29 VITALS — HR 92

## 2017-05-29 DIAGNOSIS — G620 Drug-induced polyneuropathy: Secondary | ICD-10-CM

## 2017-05-29 DIAGNOSIS — C160 Malignant neoplasm of cardia: Secondary | ICD-10-CM

## 2017-05-29 DIAGNOSIS — I10 Essential (primary) hypertension: Secondary | ICD-10-CM | POA: Diagnosis not present

## 2017-05-29 DIAGNOSIS — C778 Secondary and unspecified malignant neoplasm of lymph nodes of multiple regions: Secondary | ICD-10-CM | POA: Diagnosis not present

## 2017-05-29 DIAGNOSIS — E119 Type 2 diabetes mellitus without complications: Secondary | ICD-10-CM

## 2017-05-29 DIAGNOSIS — Z5111 Encounter for antineoplastic chemotherapy: Secondary | ICD-10-CM

## 2017-05-29 LAB — CBC WITH DIFFERENTIAL/PLATELET
BASO%: 0.2 % (ref 0.0–2.0)
BASOS ABS: 0 10*3/uL (ref 0.0–0.1)
EOS ABS: 0.1 10*3/uL (ref 0.0–0.5)
EOS%: 2.1 % (ref 0.0–7.0)
HEMATOCRIT: 37.3 % — AB (ref 38.4–49.9)
HEMOGLOBIN: 12.7 g/dL — AB (ref 13.0–17.1)
LYMPH%: 24.5 % (ref 14.0–49.0)
MCH: 30.4 pg (ref 27.2–33.4)
MCHC: 34 g/dL (ref 32.0–36.0)
MCV: 89.2 fL (ref 79.3–98.0)
MONO#: 0.5 10*3/uL (ref 0.1–0.9)
MONO%: 7.9 % (ref 0.0–14.0)
NEUT#: 3.8 10*3/uL (ref 1.5–6.5)
NEUT%: 65.3 % (ref 39.0–75.0)
Platelets: 173 10*3/uL (ref 140–400)
RBC: 4.18 10*6/uL — ABNORMAL LOW (ref 4.20–5.82)
RDW: 15.2 % — ABNORMAL HIGH (ref 11.0–14.6)
WBC: 5.8 10*3/uL (ref 4.0–10.3)
lymph#: 1.4 10*3/uL (ref 0.9–3.3)

## 2017-05-29 LAB — COMPREHENSIVE METABOLIC PANEL
ALBUMIN: 3.8 g/dL (ref 3.5–5.0)
ALK PHOS: 94 U/L (ref 40–150)
ALT: 37 U/L (ref 0–55)
AST: 27 U/L (ref 5–34)
Anion Gap: 14 mEq/L — ABNORMAL HIGH (ref 3–11)
BILIRUBIN TOTAL: 0.41 mg/dL (ref 0.20–1.20)
BUN: 12.4 mg/dL (ref 7.0–26.0)
CALCIUM: 9.8 mg/dL (ref 8.4–10.4)
CO2: 21 mEq/L — ABNORMAL LOW (ref 22–29)
Chloride: 104 mEq/L (ref 98–109)
Creatinine: 0.9 mg/dL (ref 0.7–1.3)
EGFR: >90 mL/min/{1.73_m2} (ref >90)
GLUCOSE: 279 mg/dL — AB (ref 70–140)
Potassium: 3.6 mEq/L (ref 3.5–5.1)
SODIUM: 140 meq/L (ref 136–145)
TOTAL PROTEIN: 6.9 g/dL (ref 6.4–8.3)

## 2017-05-29 MED ORDER — PALONOSETRON HCL INJECTION 0.25 MG/5ML
0.2500 mg | Freq: Once | INTRAVENOUS | Status: AC
  Administered 2017-05-29: 0.25 mg via INTRAVENOUS

## 2017-05-29 MED ORDER — LORAZEPAM 0.5 MG PO TABS: 0.5000 mg | ORAL_TABLET | Freq: Three times a day (TID) | ORAL | 0 refills | Status: DC | PRN

## 2017-05-29 MED ORDER — ATROPINE SULFATE 1 MG/ML IJ SOLN
0.5000 mg | Freq: Once | INTRAMUSCULAR | Status: AC | PRN
  Administered 2017-05-29: 0.5 mg via INTRAVENOUS

## 2017-05-29 MED ORDER — PALONOSETRON HCL INJECTION 0.25 MG/5ML
INTRAVENOUS | Status: AC
  Filled 2017-05-29: qty 5

## 2017-05-29 MED ORDER — FLUOROURACIL CHEMO INJECTION 2.5 GM/50ML
400.0000 mg/m2 | Freq: Once | INTRAVENOUS | Status: AC
  Administered 2017-05-29: 750 mg via INTRAVENOUS
  Filled 2017-05-29: qty 15

## 2017-05-29 MED ORDER — SODIUM CHLORIDE 0.9 % IV SOLN
400.0000 mg/m2 | Freq: Once | INTRAVENOUS | Status: AC
  Administered 2017-05-29: 764 mg via INTRAVENOUS
  Filled 2017-05-29: qty 38.2

## 2017-05-29 MED ORDER — PROCHLORPERAZINE MALEATE 10 MG PO TABS
10.0000 mg | ORAL_TABLET | Freq: Once | ORAL | Status: AC
  Administered 2017-05-29: 10 mg via ORAL

## 2017-05-29 MED ORDER — SODIUM CHLORIDE 0.9 % IV SOLN
Freq: Once | INTRAVENOUS | Status: AC
  Administered 2017-05-29: 10:00:00 via INTRAVENOUS

## 2017-05-29 MED ORDER — ATROPINE SULFATE 1 MG/ML IJ SOLN
INTRAMUSCULAR | Status: AC
  Filled 2017-05-29: qty 1

## 2017-05-29 MED ORDER — IRINOTECAN HCL CHEMO INJECTION 100 MG/5ML
180.0000 mg/m2 | Freq: Once | INTRAVENOUS | Status: AC
  Administered 2017-05-29: 340 mg via INTRAVENOUS
  Filled 2017-05-29: qty 15

## 2017-05-29 MED ORDER — SODIUM CHLORIDE 0.9 % IV SOLN
2400.0000 mg/m2 | INTRAVENOUS | Status: DC
  Administered 2017-05-29: 4600 mg via INTRAVENOUS
  Filled 2017-05-29: qty 92

## 2017-05-29 MED ORDER — SODIUM CHLORIDE 0.9 % IV SOLN
Freq: Once | INTRAVENOUS | Status: AC
  Administered 2017-05-29: 10:00:00 via INTRAVENOUS
  Filled 2017-05-29: qty 5

## 2017-05-29 MED ORDER — IRINOTECAN HCL CHEMO INJECTION 100 MG/5ML: 180.0000 mg/m2 | Freq: Once | INTRAVENOUS | Status: DC

## 2017-05-29 MED ORDER — LEUCOVORIN CALCIUM INJECTION 350 MG: 400.0000 mg/m2 | Freq: Once | INTRAVENOUS | Status: DC

## 2017-05-29 MED ORDER — PROCHLORPERAZINE MALEATE 10 MG PO TABS
ORAL_TABLET | ORAL | Status: AC
  Filled 2017-05-29: qty 1

## 2017-05-29 NOTE — Progress Notes (Signed)
Blood return noted before and after Adrucil push.  

## 2017-05-29 NOTE — Addendum Note (Signed)
Addended by: Brien Few on: 05/29/2017 09:14 AM   Modules accepted: Orders

## 2017-05-29 NOTE — Patient Instructions (Signed)
[facility] Discharge Instructions for Patients Receiving Chemotherapy  Today you received the following chemotherapy agents Camptosar,Leucovorin and Adrucil  To help prevent nausea and vomiting after your treatment, we encourage you to take your nausea medication as directed  If you develop nausea and vomiting that is not controlled by your nausea medication, call the clinic.   BELOW ARE SYMPTOMS THAT SHOULD BE REPORTED IMMEDIATELY:  *FEVER GREATER THAN 100.5 F  *CHILLS WITH OR WITHOUT FEVER  NAUSEA AND VOMITING THAT IS NOT CONTROLLED WITH YOUR NAUSEA MEDICATION  *UNUSUAL SHORTNESS OF BREATH  *UNUSUAL BRUISING OR BLEEDING  TENDERNESS IN MOUTH AND THROAT WITH OR WITHOUT PRESENCE OF ULCERS  *URINARY PROBLEMS  *BOWEL PROBLEMS  UNUSUAL RASH Items with * indicate a potential emergency and should be followed up as soon as possible.  Feel free to call the clinic you have any questions or concerns. The clinic phone number is [phone].  Please show the [place] at check-in to the Emergency Department and triage nurse.

## 2017-05-29 NOTE — Telephone Encounter (Signed)
Gave avs and calendar for October  °

## 2017-05-29 NOTE — Progress Notes (Signed)
[facility] OFFICE PROGRESS NOTE   Diagnosis: Gastric cancer  INTERVAL HISTORY:   Robert Beck returns as scheduled. He completed another cycle of FOLFIRI 05/15/2017. He had nausea and diarrhea following chemotherapy. The symptoms were relieved with medication. Good appetite. No dysphagia. He had an episode of "heartburn "this morning.  Objective:  Vital signs in last 24 hours:  Blood pressure 117/80, pulse (!) 101, temperature 98.7 F (37.1 C), temperature source Oral, weight 167 lb 12.8 oz (76.1 kg), SpO2 98 %.    HEENT: No thrush or ulcers Lymphatics: No cervical or supra-clavicular nodes Resp: Lungs clear bilaterally Cardio: Regular rate and rhythm GI: Soft and nontender, no hepatomegaly Vascular: No leg edema   Portacath/PICC-without erythema  Lab Results:  Lab Results  Component Value Date   WBC 5.8 05/29/2017   HGB 12.7 (L) 05/29/2017   HCT 37.3 (L) 05/29/2017   MCV 89.2 05/29/2017   PLT 173 05/29/2017   NEUTROABS 3.8 05/29/2017    CMP     Component Value Date/Time   NA 142 05/15/2017 0906   K 3.9 05/15/2017 0906   CL 105 09/13/2016 1000   CO2 26 05/15/2017 0906   GLUCOSE 162 (H) 05/15/2017 0906   BUN 9.3 05/15/2017 0906   CREATININE 0.8 05/15/2017 0906   CALCIUM 9.7 05/15/2017 0906   PROT 6.7 05/15/2017 0906   ALBUMIN 3.6 05/15/2017 0906   AST 23 05/15/2017 0906   ALT 30 05/15/2017 0906   ALKPHOS 97 05/15/2017 0906   BILITOT 0.52 05/15/2017 0906   GFRNONAA >60 09/13/2016 1000   GFRAA >60 09/13/2016 1000     Medications: I have reviewed the patient's current medications.  Assessment/Plan: 1. Gastric cancer, gastric cardia mass extending to the GE junction confirmed on endoscopy 04/11/2016, biopsy confirmed adenocarcinoma  Staging CTs of the chest, abdomen, and pelvis 04/09/2016-gastric cardia mass, mediastinal, right hilar, and right supraclavicular lymphadenopathy  Biopsy of a right supraclavicular lymph node on 04/20/2016  revealed metastatic adenocarcinoma  FoundationACT-TP53 alteration identified  Cycle 1 FOLFOX 04/30/2016 (oxaliplatin given 04/30/2016, 5-FU infusion started 05/02/2016)  Cycle 2 FOLFOX 05/14/2016  Cycle 3 FOLFOX 05/30/2016  Cycle 4 FOLFOX 06/13/2016  Cycle 5 FOLFOX 06/27/2016  Cycle 6 FOLFOX 07/11/2016  CTs 07/20/2016-decrease in supraclavicular, mediastinal, and gastrohepatic adenopathy. Decreased gastric cardia wall thickening.  Cycle 7 FOLFOX 07/25/2016  Cycle 8 FOLFOX 08/08/2016  Cycle 9 FOLFOX 08/22/2016 (oxaliplatin held due to neuropathy)  Cycle 10 FOLFOX 09/05/2016 (oxaliplatin held)  Cycle 11 FOLFOX 09/26/2016 (oxaliplatin held)  Cycle 12 FOLFOX 10/10/2016 (oxaliplatin held)  CT 10/18/2016-mild enlargement of mediastinal lymph nodes, no other evidence of disease progression, stable thickening at the GE junction and stable gastrohepatic lymphadenopathy  Cycle 13 FOLFOX 10/24/2016 (oxaliplatin held)  Maintenance Xeloda 7 days on/7 days off beginning 11/17/2016  Restaging chest CT 01/22/2017-interval progression of metastatic lymphadenopathy in the mediastinum and gastrohepatic ligament.  Salvage therapy with FOLFIRI initiated 02/20/2017  Cycle 5 FOLFIRI 04/17/2017  Restaging chest CT 04/29/2017-mild improvement in mediastinal adenopathy. No significant change in adenopathy in the gastrohepatic ligament. No evidence of disease progression.  Cycle 6 FOLFIRI 05/01/2017  Cycle 7 FOLFIRI 05/15/2017  Cycle 8 FOLFIRI 05/29/2017  2. Anemia secondary to #1-improved  3. Diabetes  4. Gout  5. Hypertension  6. Hyperlipidemia  7. History of a T7 compression fracture  8. Pain secondary to the gastric mass and mediastinal lymphadenopathy-resolved  9. Port-A-Cath placement 04/20/2016  10. 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 1 mm  obstructing stone distal right ureter/right ureteral vesicle junction on CT 09/13/2016.  13. 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 Hypercalcemia 12/19/2016-asymptomatic  15. Nausea following FOLFIRI-emend added beginning with cycle 3  Disposition:  Robert Beck appears unchanged. He will complete another cycle of FOLFIRI today. He will return for an office visit and chemotherapy in 2 weeks.  15 minutes were spent with the patient today. The majority of the time was used for counseling and coordination of care.  Donneta Romberg, MD  05/29/2017  9:09 AM

## 2017-05-30 ENCOUNTER — Other Ambulatory Visit: Payer: Self-pay | Admitting: *Deleted

## 2017-05-30 DIAGNOSIS — C160 Malignant neoplasm of cardia: Secondary | ICD-10-CM

## 2017-05-30 MED ORDER — POTASSIUM CHLORIDE CRYS ER 10 MEQ PO TBCR: 10.0000 meq | EXTENDED_RELEASE_TABLET | Freq: Two times a day (BID) | ORAL | 1 refills | Status: DC

## 2017-05-31 ENCOUNTER — Ambulatory Visit (HOSPITAL_BASED_OUTPATIENT_CLINIC_OR_DEPARTMENT_OTHER): Payer: BLUE CROSS/BLUE SHIELD

## 2017-05-31 VITALS — BP 108/68 | HR 90 | Temp 97.8°F | Resp 18

## 2017-05-31 DIAGNOSIS — C160 Malignant neoplasm of cardia: Secondary | ICD-10-CM

## 2017-05-31 MED ORDER — HEPARIN SOD (PORK) LOCK FLUSH 100 UNIT/ML IV SOLN
500.0000 [IU] | Freq: Once | INTRAVENOUS | Status: AC | PRN
  Administered 2017-05-31: 500 [IU]
  Filled 2017-05-31: qty 5

## 2017-05-31 MED ORDER — SODIUM CHLORIDE 0.9% FLUSH
10.0000 mL | INTRAVENOUS | Status: DC | PRN
  Administered 2017-05-31: 10 mL
  Filled 2017-05-31: qty 10

## 2017-05-31 NOTE — Patient Instructions (Signed)
Implanted Port Home Guide An implanted port is a type of central line that is placed under the skin. Central lines are used to provide IV access when treatment or nutrition needs to be given through a person's veins. Implanted ports are used for long-term IV access. An implanted port may be placed because:  You need IV medicine that would be irritating to the small veins in your hands or arms.  You need long-term IV medicines, such as antibiotics.  You need IV nutrition for a long period.  You need frequent blood draws for lab tests.  You need dialysis.  Implanted ports are usually placed in the chest area, but they can also be placed in the upper arm, the abdomen, or the leg. An implanted port has two main parts:  Reservoir. The reservoir is round and will appear as a small, raised area under your skin. The reservoir is the part where a needle is inserted to give medicines or draw blood.  Catheter. The catheter is a thin, flexible tube that extends from the reservoir. The catheter is placed into a large vein. Medicine that is inserted into the reservoir goes into the catheter and then into the vein.  How will I care for my incision site? Do not get the incision site wet. Bathe or shower as directed by your health care provider. How is my port accessed? Special steps must be taken to access the port:  Before the port is accessed, a numbing cream can be placed on the skin. This helps numb the skin over the port site.  Your health care provider uses a sterile technique to access the port. ? Your health care provider must put on a mask and sterile gloves. ? The skin over your port is cleaned carefully with an antiseptic and allowed to dry. ? The port is gently pinched between sterile gloves, and a needle is inserted into the port.  Only "non-coring" port needles should be used to access the port. Once the port is accessed, a blood return should be checked. This helps ensure that the port  is in the vein and is not clogged.  If your port needs to remain accessed for a constant infusion, a clear (transparent) bandage will be placed over the needle site. The bandage and needle will need to be changed every week, or as directed by your health care provider.  Keep the bandage covering the needle clean and dry. Do not get it wet. Follow your health care provider's instructions on how to take a shower or bath while the port is accessed.  If your port does not need to stay accessed, no bandage is needed over the port.  What is flushing? Flushing helps keep the port from getting clogged. Follow your health care provider's instructions on how and when to flush the port. Ports are usually flushed with saline solution or a medicine called heparin. The need for flushing will depend on how the port is used.  If the port is used for intermittent medicines or blood draws, the port will need to be flushed: ? After medicines have been given. ? After blood has been drawn. ? As part of routine maintenance.  If a constant infusion is running, the port may not need to be flushed.  How long will my port stay implanted? The port can stay in for as long as your health care provider thinks it is needed. When it is time for the port to come out, surgery will be   done to remove it. The procedure is similar to the one performed when the port was put in. When should I seek immediate medical care? When you have an implanted port, you should seek immediate medical care if:  You notice a bad smell coming from the incision site.  You have swelling, redness, or drainage at the incision site.  You have more swelling or pain at the port site or the surrounding area.  You have a fever that is not controlled with medicine.  This information is not intended to replace advice given to you by your health care provider. Make sure you discuss any questions you have with your health care provider. Document  Released: 09/10/2005 Document Revised: 02/16/2016 Document Reviewed: 05/18/2013 Elsevier Interactive Patient Education  2017 Elsevier Inc.  

## 2017-06-03 ENCOUNTER — Telehealth: Payer: Self-pay

## 2017-06-03 NOTE — Telephone Encounter (Signed)
Call placed to pt's wife as requested. Pt's wife states that their October appointments are incorrect and need to be rescheduled. Pt's wife states that she called the scheduling department several times and could not get anyone to call her back. This RN informed the pt's wife that a request would be sent to the scheduling department to call her and change the appointments. Pt's wife verbalizes understanding and is appreciative of call back.

## 2017-06-07 ENCOUNTER — Other Ambulatory Visit: Payer: Self-pay | Admitting: Oncology

## 2017-06-12 ENCOUNTER — Ambulatory Visit: Payer: BLUE CROSS/BLUE SHIELD

## 2017-06-12 ENCOUNTER — Ambulatory Visit (HOSPITAL_BASED_OUTPATIENT_CLINIC_OR_DEPARTMENT_OTHER): Payer: BLUE CROSS/BLUE SHIELD | Admitting: Nurse Practitioner

## 2017-06-12 ENCOUNTER — Telehealth: Payer: Self-pay | Admitting: Nurse Practitioner

## 2017-06-12 ENCOUNTER — Other Ambulatory Visit (HOSPITAL_BASED_OUTPATIENT_CLINIC_OR_DEPARTMENT_OTHER): Payer: BLUE CROSS/BLUE SHIELD

## 2017-06-12 VITALS — BP 123/87 | HR 103 | Temp 97.7°F | Resp 20

## 2017-06-12 VITALS — BP 146/92 | HR 98 | Temp 97.7°F | Resp 18 | Wt 169.7 lb

## 2017-06-12 DIAGNOSIS — C778 Secondary and unspecified malignant neoplasm of lymph nodes of multiple regions: Secondary | ICD-10-CM | POA: Diagnosis not present

## 2017-06-12 DIAGNOSIS — E119 Type 2 diabetes mellitus without complications: Secondary | ICD-10-CM

## 2017-06-12 DIAGNOSIS — Z95828 Presence of other vascular implants and grafts: Secondary | ICD-10-CM

## 2017-06-12 DIAGNOSIS — C160 Malignant neoplasm of cardia: Secondary | ICD-10-CM | POA: Diagnosis not present

## 2017-06-12 LAB — COMPREHENSIVE METABOLIC PANEL
ALT: 29 U/L (ref 0–55)
ANION GAP: 14 meq/L — AB (ref 3–11)
AST: 20 U/L (ref 5–34)
Albumin: 3.7 g/dL (ref 3.5–5.0)
Alkaline Phosphatase: 97 U/L (ref 40–150)
BUN: 6.7 mg/dL — ABNORMAL LOW (ref 7.0–26.0)
CHLORIDE: 102 meq/L (ref 98–109)
CO2: 22 meq/L (ref 22–29)
Calcium: 9.8 mg/dL (ref 8.4–10.4)
Creatinine: 0.9 mg/dL (ref 0.7–1.3)
GLUCOSE: 393 mg/dL — AB (ref 70–140)
Potassium: 3.7 mEq/L (ref 3.5–5.1)
SODIUM: 138 meq/L (ref 136–145)
Total Bilirubin: 0.39 mg/dL (ref 0.20–1.20)
Total Protein: 6.7 g/dL (ref 6.4–8.3)

## 2017-06-12 LAB — CBC WITH DIFFERENTIAL/PLATELET
BASO%: 0.2 % (ref 0.0–2.0)
Basophils Absolute: 0 10*3/uL (ref 0.0–0.1)
EOS%: 2.7 % (ref 0.0–7.0)
Eosinophils Absolute: 0.2 10*3/uL (ref 0.0–0.5)
HCT: 36.9 % — ABNORMAL LOW (ref 38.4–49.9)
HGB: 12.6 g/dL — ABNORMAL LOW (ref 13.0–17.1)
LYMPH%: 22.5 % (ref 14.0–49.0)
MCH: 30.1 pg (ref 27.2–33.4)
MCHC: 34.1 g/dL (ref 32.0–36.0)
MCV: 88.3 fL (ref 79.3–98.0)
MONO#: 0.5 10*3/uL (ref 0.1–0.9)
MONO%: 8.5 % (ref 0.0–14.0)
NEUT#: 3.9 10*3/uL (ref 1.5–6.5)
NEUT%: 66.1 % (ref 39.0–75.0)
PLATELETS: 169 10*3/uL (ref 140–400)
RBC: 4.18 10*6/uL — AB (ref 4.20–5.82)
RDW: 14.5 % (ref 11.0–14.6)
WBC: 5.9 10*3/uL (ref 4.0–10.3)
lymph#: 1.3 10*3/uL (ref 0.9–3.3)

## 2017-06-12 MED ORDER — HEPARIN SOD (PORK) LOCK FLUSH 100 UNIT/ML IV SOLN
500.0000 [IU] | Freq: Once | INTRAVENOUS | Status: AC | PRN
  Administered 2017-06-12: 500 [IU] via INTRAVENOUS
  Filled 2017-06-12: qty 5

## 2017-06-12 MED ORDER — SODIUM CHLORIDE 0.9% FLUSH
10.0000 mL | INTRAVENOUS | Status: AC | PRN
  Administered 2017-06-12: 10 mL via INTRAVENOUS
  Filled 2017-06-12: qty 10

## 2017-06-12 MED ORDER — SODIUM CHLORIDE 0.9% FLUSH
10.0000 mL | INTRAVENOUS | Status: DC | PRN
  Administered 2017-06-12: 10 mL via INTRAVENOUS
  Filled 2017-06-12: qty 10

## 2017-06-12 NOTE — Progress Notes (Signed)
[place] OFFICE PROGRESS NOTE   Diagnosis:  Gastric cancer  INTERVAL HISTORY:   Robert Beck returns as scheduled. He completed cycle 8 FOLFIRI 05/29/2017. He had some nausea and diarrhea which were controlled with his home medications. No mouth sores. He reports a good appetite but feels weaker. Blood sugars have been running greater than 200. He overall does not feel well. Hoarseness is better.  Objective:  Vital signs in last 24 hours:  Blood pressure (!) 146/92, pulse 98, temperature 97.7 F (36.5 C), temperature source Oral, resp. rate 18, weight 169 lb 11.2 oz (77 kg), SpO2 100 %.    HEENT: No thrush or ulcers. Resp: Lungs clear bilaterally. Cardio: Regular rate and rhythm. GI: Abdomen soft and nontender. No hepatosplenomegaly. Vascular: No leg edema. Skin: Warm and dry. Port-A-Cath without erythema.    Lab Results:  Lab Results  Component Value Date   WBC 5.9 06/12/2017   HGB 12.6 (L) 06/12/2017   HCT 36.9 (L) 06/12/2017   MCV 88.3 06/12/2017   PLT 169 06/12/2017   NEUTROABS 3.9 06/12/2017    Imaging:  No results found.  Medications: I have reviewed the patient's current medications.  Assessment/Plan: 1. Gastric cancer, gastric cardia mass extendingto the GE junction confirmed on endoscopy 04/11/2016, biopsy confirmed adenocarcinoma  Staging CTs of the chest, abdomen, and pelvis 04/09/2016-gastric cardia mass, mediastinal, right hilar, and right supraclavicular lymphadenopathy  Biopsy of a right supraclavicular lymph node on 04/20/2016 revealed metastatic adenocarcinoma  FoundationACT-TP53 alteration identified  Cycle 1 FOLFOX 04/30/2016 (oxaliplatin given 04/30/2016, 5-FU infusion started 05/02/2016)  Cycle 2 FOLFOX 05/14/2016  Cycle 3 FOLFOX 05/30/2016  Cycle 4 FOLFOX 06/13/2016  Cycle 5 FOLFOX 06/27/2016  Cycle 6 FOLFOX 07/11/2016  CTs 07/20/2016-decrease in supraclavicular, mediastinal, and gastrohepatic adenopathy.  Decreased gastric cardia wall thickening.  Cycle 7 FOLFOX 07/25/2016  Cycle 8 FOLFOX 08/08/2016  Cycle 9 FOLFOX 08/22/2016 (oxaliplatin held due to neuropathy)  Cycle 10 FOLFOX 09/05/2016 (oxaliplatin held)  Cycle 11 FOLFOX 09/26/2016 (oxaliplatin held)  Cycle 12 FOLFOX 10/10/2016 (oxaliplatin held)  CT 10/18/2016-mild enlargement of mediastinal lymph nodes, no other evidence of disease progression, stable thickening at the GE junction and stable gastrohepatic lymphadenopathy  Cycle 13 FOLFOX 10/24/2016 (oxaliplatin held)  Maintenance Xeloda 7 days on/7 days off beginning 11/17/2016  Restaging chest CT 01/22/2017-interval progression of metastatic lymphadenopathy in the mediastinum and gastrohepatic ligament.  Salvage therapy with FOLFIRI initiated 02/20/2017  Cycle 5 FOLFIRI 04/17/2017  Restaging chest CT 04/29/2017-mild improvement in mediastinal adenopathy. No significant change in adenopathy in the gastrohepatic ligament. No evidence of disease progression.  Cycle 6 FOLFIRI 05/01/2017  Cycle 7 FOLFIRI 05/15/2017  Cycle 8 FOLFIRI 05/29/2017  2. Anemia secondary to #1-improved  3. Diabetes  4. Gout  5. Hypertension  6. Hyperlipidemia  7. History of a T7 compression fracture  8. Pain secondary to the gastric mass and mediastinal lymphadenopathy-resolved  9. Port-A-Cath placement 04/20/2016  10. 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 1 mm obstructing stone distal right ureter/right ureteral vesicle junction on CT 09/13/2016.  13. 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 Hypercalcemia 12/19/2016-asymptomatic  15. Nausea following FOLFIRI-emend added beginning with cycle 3   Disposition: Mr. Want has completed 8 cycles of FOLFIRI. He presents  today prior to cycle 9. He has not been feeling well in general. Blood sugar is close to 400 on labs today. We  decided to hold treatment today and reschedule for one week. He will work on getting the blood sugars under control. If blood sugars remain elevated he will contact Dr. Virgina Jock.  We will see him in follow-up in one week prior to proceeding with cycle 9 FOLFIRI. He will contact the office in the interim with any problems.  Plan reviewed with Dr. Benay Spice.    Ned Card ANP/GNP-BC   06/12/2017  9:47 AM

## 2017-06-12 NOTE — Telephone Encounter (Signed)
Scheduled appt per 9/19 los - Gave patient AVS and calender per los. 

## 2017-06-12 NOTE — Addendum Note (Signed)
Addended by: Hughie Closs on: 06/12/2017 10:49 AM   Modules accepted: Orders

## 2017-06-19 ENCOUNTER — Ambulatory Visit (HOSPITAL_BASED_OUTPATIENT_CLINIC_OR_DEPARTMENT_OTHER): Payer: BLUE CROSS/BLUE SHIELD

## 2017-06-19 ENCOUNTER — Ambulatory Visit: Payer: BLUE CROSS/BLUE SHIELD

## 2017-06-19 ENCOUNTER — Ambulatory Visit (HOSPITAL_BASED_OUTPATIENT_CLINIC_OR_DEPARTMENT_OTHER): Payer: BLUE CROSS/BLUE SHIELD | Admitting: Oncology

## 2017-06-19 ENCOUNTER — Other Ambulatory Visit (HOSPITAL_BASED_OUTPATIENT_CLINIC_OR_DEPARTMENT_OTHER): Payer: BLUE CROSS/BLUE SHIELD

## 2017-06-19 ENCOUNTER — Telehealth: Payer: Self-pay | Admitting: Oncology

## 2017-06-19 VITALS — BP 154/93 | HR 98 | Temp 97.6°F | Resp 17 | Ht 66.0 in | Wt 169.4 lb

## 2017-06-19 DIAGNOSIS — C160 Malignant neoplasm of cardia: Secondary | ICD-10-CM

## 2017-06-19 DIAGNOSIS — C778 Secondary and unspecified malignant neoplasm of lymph nodes of multiple regions: Secondary | ICD-10-CM

## 2017-06-19 DIAGNOSIS — R110 Nausea: Secondary | ICD-10-CM | POA: Diagnosis not present

## 2017-06-19 DIAGNOSIS — I10 Essential (primary) hypertension: Secondary | ICD-10-CM

## 2017-06-19 DIAGNOSIS — Z95828 Presence of other vascular implants and grafts: Secondary | ICD-10-CM

## 2017-06-19 DIAGNOSIS — Z23 Encounter for immunization: Secondary | ICD-10-CM

## 2017-06-19 DIAGNOSIS — Z5111 Encounter for antineoplastic chemotherapy: Secondary | ICD-10-CM

## 2017-06-19 DIAGNOSIS — E119 Type 2 diabetes mellitus without complications: Secondary | ICD-10-CM | POA: Diagnosis not present

## 2017-06-19 DIAGNOSIS — G620 Drug-induced polyneuropathy: Secondary | ICD-10-CM | POA: Diagnosis not present

## 2017-06-19 LAB — COMPREHENSIVE METABOLIC PANEL
ALBUMIN: 3.8 g/dL (ref 3.5–5.0)
ALK PHOS: 96 U/L (ref 40–150)
ALT: 31 U/L (ref 0–55)
AST: 24 U/L (ref 5–34)
Anion Gap: 13 mEq/L — ABNORMAL HIGH (ref 3–11)
BUN: 8.5 mg/dL (ref 7.0–26.0)
CALCIUM: 9.8 mg/dL (ref 8.4–10.4)
CHLORIDE: 106 meq/L (ref 98–109)
CO2: 21 mEq/L — ABNORMAL LOW (ref 22–29)
Creatinine: 0.9 mg/dL (ref 0.7–1.3)
GLUCOSE: 326 mg/dL — AB (ref 70–140)
POTASSIUM: 3.9 meq/L (ref 3.5–5.1)
SODIUM: 140 meq/L (ref 136–145)
Total Bilirubin: 0.46 mg/dL (ref 0.20–1.20)
Total Protein: 7.3 g/dL (ref 6.4–8.3)

## 2017-06-19 LAB — CBC WITH DIFFERENTIAL/PLATELET
BASO%: 0.3 % (ref 0.0–2.0)
BASOS ABS: 0 10*3/uL (ref 0.0–0.1)
EOS ABS: 0.2 10*3/uL (ref 0.0–0.5)
EOS%: 2.8 % (ref 0.0–7.0)
HCT: 39.3 % (ref 38.4–49.9)
HEMOGLOBIN: 13.2 g/dL (ref 13.0–17.1)
LYMPH%: 24.6 % (ref 14.0–49.0)
MCH: 29.9 pg (ref 27.2–33.4)
MCHC: 33.6 g/dL (ref 32.0–36.0)
MCV: 88.9 fL (ref 79.3–98.0)
MONO#: 0.7 10*3/uL (ref 0.1–0.9)
MONO%: 11.4 % (ref 0.0–14.0)
NEUT#: 3.6 10*3/uL (ref 1.5–6.5)
NEUT%: 60.9 % (ref 39.0–75.0)
Platelets: 199 10*3/uL (ref 140–400)
RBC: 4.42 10*6/uL (ref 4.20–5.82)
RDW: 14.2 % (ref 11.0–14.6)
WBC: 6 10*3/uL (ref 4.0–10.3)
lymph#: 1.5 10*3/uL (ref 0.9–3.3)

## 2017-06-19 MED ORDER — SODIUM CHLORIDE 0.9 % IV SOLN
Freq: Once | INTRAVENOUS | Status: AC
  Administered 2017-06-19: 13:00:00 via INTRAVENOUS
  Filled 2017-06-19: qty 5

## 2017-06-19 MED ORDER — PALONOSETRON HCL INJECTION 0.25 MG/5ML
0.2500 mg | Freq: Once | INTRAVENOUS | Status: AC
  Administered 2017-06-19: 0.25 mg via INTRAVENOUS

## 2017-06-19 MED ORDER — FLUOROURACIL CHEMO INJECTION 2.5 GM/50ML
400.0000 mg/m2 | Freq: Once | INTRAVENOUS | Status: AC
  Administered 2017-06-19: 750 mg via INTRAVENOUS
  Filled 2017-06-19: qty 15

## 2017-06-19 MED ORDER — SODIUM CHLORIDE 0.9 % IV SOLN
2400.0000 mg/m2 | INTRAVENOUS | Status: DC
  Administered 2017-06-19: 4600 mg via INTRAVENOUS
  Filled 2017-06-19: qty 92

## 2017-06-19 MED ORDER — INFLUENZA VAC SPLIT QUAD 0.5 ML IM SUSY
0.5000 mL | PREFILLED_SYRINGE | Freq: Once | INTRAMUSCULAR | Status: AC
  Administered 2017-06-19: 0.5 mL via INTRAMUSCULAR
  Filled 2017-06-19: qty 0.5

## 2017-06-19 MED ORDER — ATROPINE SULFATE 1 MG/ML IJ SOLN
0.5000 mg | Freq: Once | INTRAMUSCULAR | Status: AC | PRN
  Administered 2017-06-19: 0.5 mg via INTRAVENOUS

## 2017-06-19 MED ORDER — SODIUM CHLORIDE 0.9 % IV SOLN
Freq: Once | INTRAVENOUS | Status: AC
  Administered 2017-06-19: 12:00:00 via INTRAVENOUS

## 2017-06-19 MED ORDER — ATROPINE SULFATE 1 MG/ML IJ SOLN
INTRAMUSCULAR | Status: AC
  Filled 2017-06-19: qty 1

## 2017-06-19 MED ORDER — SODIUM CHLORIDE 0.9% FLUSH
10.0000 mL | INTRAVENOUS | Status: DC | PRN
  Administered 2017-06-19: 10 mL via INTRAVENOUS
  Filled 2017-06-19: qty 10

## 2017-06-19 MED ORDER — SODIUM CHLORIDE 0.9 % IV SOLN
400.0000 mg/m2 | Freq: Once | INTRAVENOUS | Status: AC
  Administered 2017-06-19: 764 mg via INTRAVENOUS
  Filled 2017-06-19: qty 38.2

## 2017-06-19 MED ORDER — IRINOTECAN HCL CHEMO INJECTION 100 MG/5ML
180.0000 mg/m2 | Freq: Once | INTRAVENOUS | Status: AC
  Administered 2017-06-19: 340 mg via INTRAVENOUS
  Filled 2017-06-19: qty 15

## 2017-06-19 MED ORDER — PALONOSETRON HCL INJECTION 0.25 MG/5ML
INTRAVENOUS | Status: AC
  Filled 2017-06-19: qty 5

## 2017-06-19 NOTE — Patient Instructions (Signed)
[facility] Discharge Instructions for Patients Receiving Chemotherapy  Today you received the following chemotherapy agents Irinotecan, Leucovorin and Adrucil  To help prevent nausea and vomiting after your treatment, we encourage you to take your nausea medication as directed.    If you develop nausea and vomiting that is not controlled by your nausea medication, call the clinic.   BELOW ARE SYMPTOMS THAT SHOULD BE REPORTED IMMEDIATELY:  *FEVER GREATER THAN 100.5 F  *CHILLS WITH OR WITHOUT FEVER  NAUSEA AND VOMITING THAT IS NOT CONTROLLED WITH YOUR NAUSEA MEDICATION  *UNUSUAL SHORTNESS OF BREATH  *UNUSUAL BRUISING OR BLEEDING  TENDERNESS IN MOUTH AND THROAT WITH OR WITHOUT PRESENCE OF ULCERS  *URINARY PROBLEMS  *BOWEL PROBLEMS  UNUSUAL RASH Items with * indicate a potential emergency and should be followed up as soon as possible.  Feel free to call the clinic should you have any questions or concerns. The clinic phone number is [phone].  Please show the CHEMO ALERT CARD at check-in to the Emergency Department and triage nurse.   

## 2017-06-19 NOTE — Progress Notes (Addendum)
[facility] OFF[facility] PROGRESS NOTE   Diagnosis: Gastric cancer  INTERVAL HISTORY:   RobertSpringborn returns as scheduled. He completed another cycle of FOLFIRI 05/29/2017. Chemotherapy was held last week secondary to hyperglycemia and malaise. He reports feeling better. He has nausea following chemotherapy and would like to adjust the anti-emetic regimen. His blood sugar remains elevated. He has diarrhea up to 2 times per day, relieved with Imodium.  The hoarseness has improved. Objective:  Vital signs in last 24 hours:  Blood pressure (!) 154/93, pulse 98, temperature 97.6 F (36.4 C), temperature source Oral, resp. rate 17, height _0  (1.676 m), weight 169 lb 6.4 oz (76.8 kg), SpO2 99 %.    HEENT: No thrush or ulcers Resp: Lungs clear bilaterally Cardio: Regular rate and rhythm GI: No hepatosplenomegaly, nontender Vascular: No leg edema  Portacath/PICC-without erythema  Lab Results:  Lab Results  Component Value Date   WBC 6.0 06/19/2017   HGB 13.2 06/19/2017   HCT 39.3 06/19/2017   MCV 88.9 06/19/2017   PLT 199 06/19/2017   NEUTROABS 3.6 06/19/2017    CMP     Component Value Date/Time   NA 140 06/19/2017 1018   K 3.9 06/19/2017 1018   CL 105 09/13/2016 1000   CO2 21 (L) 06/19/2017 1018   GLUCOSE 326 (H) 06/19/2017 1018   BUN 8.5 06/19/2017 1018   CREATININE 0.9 06/19/2017 1018   CALCIUM 9.8 06/19/2017 1018   PROT 7.3 06/19/2017 1018   ALBUMIN 3.8 06/19/2017 1018   AST 24 06/19/2017 1018   ALT 31 06/19/2017 1018   ALKPHOS 96 06/19/2017 1018   BILITOT 0.46 06/19/2017 1018   GFRNONAA >60 09/13/2016 1000   GFRAA >60 09/13/2016 1000    Lab Results  Component Value Date   CEA1 3.83 06/27/2016      Medications: I have reviewed the patient's current medications.  Assessment/Plan: 1. Gastric cancer, gastric cardia mass extendingto the GE junction confirmed on endoscopy 04/11/2016, biopsy confirmed adenocarcinoma  Staging CTs of the chest,  abdomen, and pelvis 04/09/2016-gastric cardia mass, mediastinal, right hilar, and right supraclavicular lymphadenopathy  Biopsy of a right supraclavicular lymph node on 04/20/2016 revealed metastatic adenocarcinoma  FoundationACT 02/20/2017-TP53 alteration identified  Cycle 1 FOLFOX 04/30/2016 (oxaliplatin given 04/30/2016, 5-FU infusion started 05/02/2016)  Cycle 2 FOLFOX 05/14/2016  Cycle 3 FOLFOX 05/30/2016  Cycle 4 FOLFOX 06/13/2016  Cycle 5 FOLFOX 06/27/2016  Cycle 6 FOLFOX 07/11/2016  CTs 07/20/2016-decrease in supraclavicular, mediastinal, and gastrohepatic adenopathy. Decreased gastric cardia wall thickening.  Cycle 7 FOLFOX 07/25/2016  Cycle 8 FOLFOX 08/08/2016  Cycle 9 FOLFOX 08/22/2016 (oxaliplatin held due to neuropathy)  Cycle 10 FOLFOX 09/05/2016 (oxaliplatin held)  Cycle 11 FOLFOX 09/26/2016 (oxaliplatin held)  Cycle 12 FOLFOX 10/10/2016 (oxaliplatin held)  CT 10/18/2016-mild enlargement of mediastinal lymph nodes, no other evidence of disease progression, stable thickening at the GE junction and stable gastrohepatic lymphadenopathy  Cycle 13 FOLFOX 10/24/2016 (oxaliplatin held)  Maintenance Xeloda 7 days on/7 days off beginning 11/17/2016  Restaging chest CT 01/22/2017-interval progression of metastatic lymphadenopathy in the mediastinum and gastrohepatic ligament.  Salvage therapy with FOLFIRI initiated 02/20/2017  Cycle 5 FOLFIRI 04/17/2017  Restaging chest CT 04/29/2017-mild improvement in mediastinal adenopathy. No significant change in adenopathy in the gastrohepatic ligament. No evidence of disease progression.  Cycle 6 FOLFIRI 05/01/2017  Cycle 7 FOLFIRI 05/15/2017  Cycle 8 FOLFIRI 05/29/2017  Cycle 9 FOLFIRI 06/19/2017  2. Anemia secondary to #1-improved  3. Diabetes  4. Gout  5. Hypertension  6. Hyperlipidemia  7. History of a T7 compression fracture  8. Pain secondary to the gastric mass and mediastinal  lymphadenopathy-resolved  9. Port-A-Cath placement 04/20/2016  10. 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 1 mm obstructing stone distal right ureter/right ureteral vesicle junction on CT 09/13/2016.  13. 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 Hypercalcemia 12/19/2016-asymptomatic  15. Nausea following FOLFIRI-emend added beginning with cycle 3  Disposition:  Robert Beck appears unchanged. He will complete another cycle of FOLFIRI today. I encouraged him to avoid concentrated sweets. He will schedule an appointment with Dr. Virgina Jock to discuss diabetes management. We can consider adding low-dose Decadron prophylaxis for delayed nausea, but this will elevate his blood sugar. He will need an insulin sliding scale. With this cycle he will try an increased dose of Ativan for delayed nausea. He will contact us if this does not help.  Robert Beck will return for an office visit and chemotherapy in 2 weeks.  The plan is to schedule a restaging CT after 2 additional cycles of FOLFIRI.  Donneta Romberg, MD  06/19/2017  11:32 AM

## 2017-06-19 NOTE — Telephone Encounter (Signed)
Scheduled apt per 9/26 los - Gave patient AVS and calender per los.

## 2017-06-21 ENCOUNTER — Ambulatory Visit (HOSPITAL_BASED_OUTPATIENT_CLINIC_OR_DEPARTMENT_OTHER): Payer: BLUE CROSS/BLUE SHIELD

## 2017-06-21 VITALS — BP 144/88 | HR 90 | Temp 98.4°F | Resp 18

## 2017-06-21 DIAGNOSIS — C160 Malignant neoplasm of cardia: Secondary | ICD-10-CM | POA: Diagnosis not present

## 2017-06-21 DIAGNOSIS — Z95828 Presence of other vascular implants and grafts: Secondary | ICD-10-CM

## 2017-06-21 MED ORDER — SODIUM CHLORIDE 0.9% FLUSH
10.0000 mL | INTRAVENOUS | Status: DC | PRN
  Administered 2017-06-21: 10 mL via INTRAVENOUS
  Filled 2017-06-21: qty 10

## 2017-06-21 MED ORDER — HEPARIN SOD (PORK) LOCK FLUSH 100 UNIT/ML IV SOLN
500.0000 [IU] | Freq: Once | INTRAVENOUS | Status: AC | PRN
  Administered 2017-06-21: 500 [IU] via INTRAVENOUS
  Filled 2017-06-21: qty 5

## 2017-06-27 ENCOUNTER — Ambulatory Visit: Payer: BLUE CROSS/BLUE SHIELD

## 2017-06-27 ENCOUNTER — Other Ambulatory Visit: Payer: BLUE CROSS/BLUE SHIELD

## 2017-06-27 ENCOUNTER — Ambulatory Visit: Payer: BLUE CROSS/BLUE SHIELD | Admitting: Oncology

## 2017-06-28 ENCOUNTER — Other Ambulatory Visit: Payer: Self-pay | Admitting: Oncology

## 2017-07-02 ENCOUNTER — Encounter: Payer: Self-pay | Admitting: *Deleted

## 2017-07-03 ENCOUNTER — Ambulatory Visit (HOSPITAL_BASED_OUTPATIENT_CLINIC_OR_DEPARTMENT_OTHER): Payer: BLUE CROSS/BLUE SHIELD

## 2017-07-03 ENCOUNTER — Telehealth: Payer: Self-pay | Admitting: Oncology

## 2017-07-03 ENCOUNTER — Other Ambulatory Visit: Payer: Self-pay

## 2017-07-03 ENCOUNTER — Ambulatory Visit: Payer: BLUE CROSS/BLUE SHIELD

## 2017-07-03 ENCOUNTER — Other Ambulatory Visit (HOSPITAL_BASED_OUTPATIENT_CLINIC_OR_DEPARTMENT_OTHER): Payer: BLUE CROSS/BLUE SHIELD

## 2017-07-03 ENCOUNTER — Ambulatory Visit (HOSPITAL_BASED_OUTPATIENT_CLINIC_OR_DEPARTMENT_OTHER): Payer: BLUE CROSS/BLUE SHIELD | Admitting: Oncology

## 2017-07-03 VITALS — BP 132/78 | HR 87 | Temp 97.7°F | Resp 18 | Ht 66.0 in | Wt 169.5 lb

## 2017-07-03 DIAGNOSIS — I10 Essential (primary) hypertension: Secondary | ICD-10-CM | POA: Diagnosis not present

## 2017-07-03 DIAGNOSIS — R110 Nausea: Secondary | ICD-10-CM | POA: Diagnosis not present

## 2017-07-03 DIAGNOSIS — C160 Malignant neoplasm of cardia: Secondary | ICD-10-CM

## 2017-07-03 DIAGNOSIS — E119 Type 2 diabetes mellitus without complications: Secondary | ICD-10-CM | POA: Diagnosis not present

## 2017-07-03 DIAGNOSIS — C778 Secondary and unspecified malignant neoplasm of lymph nodes of multiple regions: Secondary | ICD-10-CM | POA: Diagnosis not present

## 2017-07-03 DIAGNOSIS — G620 Drug-induced polyneuropathy: Secondary | ICD-10-CM | POA: Diagnosis not present

## 2017-07-03 DIAGNOSIS — Z5111 Encounter for antineoplastic chemotherapy: Secondary | ICD-10-CM | POA: Diagnosis not present

## 2017-07-03 DIAGNOSIS — R197 Diarrhea, unspecified: Secondary | ICD-10-CM | POA: Diagnosis not present

## 2017-07-03 LAB — CBC WITH DIFFERENTIAL/PLATELET
BASO%: 0.1 % (ref 0.0–2.0)
Basophils Absolute: 0 10*3/uL (ref 0.0–0.1)
EOS%: 1.6 % (ref 0.0–7.0)
Eosinophils Absolute: 0.1 10*3/uL (ref 0.0–0.5)
HCT: 37.5 % — ABNORMAL LOW (ref 38.4–49.9)
HGB: 12.7 g/dL — ABNORMAL LOW (ref 13.0–17.1)
LYMPH%: 15.1 % (ref 14.0–49.0)
MCH: 29.3 pg (ref 27.2–33.4)
MCHC: 33.9 g/dL (ref 32.0–36.0)
MCV: 86.6 fL (ref 79.3–98.0)
MONO#: 0.6 10*3/uL (ref 0.1–0.9)
MONO%: 9.1 % (ref 0.0–14.0)
NEUT%: 74.1 % (ref 39.0–75.0)
NEUTROS ABS: 5 10*3/uL (ref 1.5–6.5)
Platelets: 196 10*3/uL (ref 140–400)
RBC: 4.33 10*6/uL (ref 4.20–5.82)
RDW: 14.1 % (ref 11.0–14.6)
WBC: 6.7 10*3/uL (ref 4.0–10.3)
lymph#: 1 10*3/uL (ref 0.9–3.3)

## 2017-07-03 LAB — COMPREHENSIVE METABOLIC PANEL
ALT: 25 U/L (ref 0–55)
AST: 22 U/L (ref 5–34)
Albumin: 4 g/dL (ref 3.5–5.0)
Alkaline Phosphatase: 88 U/L (ref 40–150)
Anion Gap: 12 mEq/L — ABNORMAL HIGH (ref 3–11)
BILIRUBIN TOTAL: 0.38 mg/dL (ref 0.20–1.20)
BUN: 7.8 mg/dL (ref 7.0–26.0)
CO2: 23 meq/L (ref 22–29)
CREATININE: 0.7 mg/dL (ref 0.7–1.3)
Calcium: 9.7 mg/dL (ref 8.4–10.4)
Chloride: 108 mEq/L (ref 98–109)
EGFR: >60 mL/min/{1.73_m2} (ref >60)
GLUCOSE: 114 mg/dL (ref 70–140)
Potassium: 3.5 mEq/L (ref 3.5–5.1)
SODIUM: 143 meq/L (ref 136–145)
TOTAL PROTEIN: 7.1 g/dL (ref 6.4–8.3)

## 2017-07-03 MED ORDER — IRINOTECAN HCL CHEMO INJECTION 100 MG/5ML
180.0000 mg/m2 | Freq: Once | INTRAVENOUS | Status: AC
  Administered 2017-07-03: 340 mg via INTRAVENOUS
  Filled 2017-07-03: qty 17

## 2017-07-03 MED ORDER — PALONOSETRON HCL INJECTION 0.25 MG/5ML
INTRAVENOUS | Status: AC
  Filled 2017-07-03: qty 5

## 2017-07-03 MED ORDER — FLUOROURACIL CHEMO INJECTION 2.5 GM/50ML
400.0000 mg/m2 | Freq: Once | INTRAVENOUS | Status: AC
Start: 2017-07-03 — End: 2017-07-03
  Administered 2017-07-03: 750 mg via INTRAVENOUS
  Filled 2017-07-03: qty 15

## 2017-07-03 MED ORDER — SODIUM CHLORIDE 0.9 % IV SOLN
2400.0000 mg/m2 | INTRAVENOUS | Status: DC
  Administered 2017-07-03: 4600 mg via INTRAVENOUS
  Filled 2017-07-03: qty 92

## 2017-07-03 MED ORDER — SODIUM CHLORIDE 0.9 % IV SOLN
Freq: Once | INTRAVENOUS | Status: AC
  Administered 2017-07-03: 11:00:00 via INTRAVENOUS
  Filled 2017-07-03: qty 5

## 2017-07-03 MED ORDER — ATROPINE SULFATE 1 MG/ML IJ SOLN
INTRAMUSCULAR | Status: AC
Start: 2017-07-03 — End: 2017-07-03
  Filled 2017-07-03: qty 1

## 2017-07-03 MED ORDER — LEUCOVORIN CALCIUM INJECTION 350 MG
400.0000 mg/m2 | Freq: Once | INTRAMUSCULAR | Status: AC
  Administered 2017-07-03: 764 mg via INTRAVENOUS
  Filled 2017-07-03: qty 38.2

## 2017-07-03 MED ORDER — LORAZEPAM 0.5 MG PO TABS: 0.5000 mg | ORAL_TABLET | Freq: Three times a day (TID) | ORAL | 0 refills | Status: DC | PRN

## 2017-07-03 MED ORDER — ATROPINE SULFATE 1 MG/ML IJ SOLN
0.5000 mg | Freq: Once | INTRAMUSCULAR | Status: AC | PRN
  Administered 2017-07-03: 0.5 mg via INTRAVENOUS

## 2017-07-03 MED ORDER — PALONOSETRON HCL INJECTION 0.25 MG/5ML
0.2500 mg | Freq: Once | INTRAVENOUS | Status: AC
  Administered 2017-07-03: 0.25 mg via INTRAVENOUS

## 2017-07-03 MED ORDER — ONDANSETRON HCL 8 MG PO TABS: 8.0000 mg | ORAL_TABLET | Freq: Three times a day (TID) | ORAL | 1 refills | Status: DC | PRN

## 2017-07-03 MED ORDER — METOCLOPRAMIDE HCL 10 MG PO TABS: 10.0000 mg | ORAL_TABLET | Freq: Three times a day (TID) | ORAL | 2 refills | Status: DC

## 2017-07-03 MED ORDER — SODIUM CHLORIDE 0.9 % IV SOLN
Freq: Once | INTRAVENOUS | Status: AC
  Administered 2017-07-03: 10:00:00 via INTRAVENOUS

## 2017-07-03 MED ORDER — PROCHLORPERAZINE MALEATE 10 MG PO TABS: 10.0000 mg | ORAL_TABLET | Freq: Four times a day (QID) | ORAL | 1 refills | Status: DC | PRN

## 2017-07-03 NOTE — Telephone Encounter (Signed)
Scheduled appt per 10/10 los - Gave patient AVS and calender per los.  

## 2017-07-03 NOTE — Patient Instructions (Signed)
[facility] Discharge Instructions for Patients Receiving Chemotherapy  Today you received the following chemotherapy agents Irinotecan, Leucovorin and Adrucil  To help prevent nausea and vomiting after your treatment, we encourage you to take your nausea medication as directed.    If you develop nausea and vomiting that is not controlled by your nausea medication, call the clinic.   BELOW ARE SYMPTOMS THAT SHOULD BE REPORTED IMMEDIATELY:  *FEVER GREATER THAN 100.5 F  *CHILLS WITH OR WITHOUT FEVER  NAUSEA AND VOMITING THAT IS NOT CONTROLLED WITH YOUR NAUSEA MEDICATION  *UNUSUAL SHORTNESS OF BREATH  *UNUSUAL BRUISING OR BLEEDING  TENDERNESS IN MOUTH AND THROAT WITH OR WITHOUT PRESENCE OF ULCERS  *URINARY PROBLEMS  *BOWEL PROBLEMS  UNUSUAL RASH Items with * indicate a potential emergency and should be followed up as soon as possible.  Feel free to call the clinic should you have any questions or concerns. The clinic phone number is [phone].  Please show the CHEMO ALERT CARD at check-in to the Emergency Department and triage nurse.   

## 2017-07-03 NOTE — Progress Notes (Signed)
[facility] OFFICE PROGRESS NOTE   Diagnosis: Gastric cancer  INTERVAL HISTORY:   Mr.Greenhouse completed another cycle of FOLFIRI 06/19/2017. He reports mild diarrhea following chemotherapy. He continues to have delayed nausea. He takes Zofran, Compazine, and Ativan with partial relief. Hoarseness has improved. No dysphagia. No pain.    Objective:  Vital signs in last 24 hours:  Blood pressure 132/78, pulse 87, temperature 97.7 F (36.5 C), temperature source Oral, resp. rate 18, height '5\' 6"'  (1.676 m), weight 169 lb 8 oz (76.9 kg), SpO2 98 %.    HEENT: No thrush or ulcers, neck without mass Lymphatics: No cervical or supraclavicular nodes Resp: Lungs clear bilaterally Cardio: Regular rate and rhythm GI: No hepatosplenomegaly, nontender Vascular: No leg edema    Portacath/PICC-without erythema  Lab Results:  Lab Results  Component Value Date   WBC 6.7 07/03/2017   HGB 12.7 (L) 07/03/2017   HCT 37.5 (L) 07/03/2017   MCV 86.6 07/03/2017   PLT 196 07/03/2017   NEUTROABS 5.0 07/03/2017    CMP     Component Value Date/Time   NA 143 07/03/2017 0807   K 3.5 07/03/2017 0807   CL 105 09/13/2016 1000   CO2 23 07/03/2017 0807   GLUCOSE 114 07/03/2017 0807   BUN 7.8 07/03/2017 0807   CREATININE 0.7 07/03/2017 0807   CALCIUM 9.7 07/03/2017 0807   PROT 7.1 07/03/2017 0807   ALBUMIN 4.0 07/03/2017 0807   AST 22 07/03/2017 0807   ALT 25 07/03/2017 0807   ALKPHOS 88 07/03/2017 0807   BILITOT 0.38 07/03/2017 0807   GFRNONAA >60 09/13/2016 1000   GFRAA >60 09/13/2016 1000    Lab Results  Component Value Date   CEA1 3.83 06/27/2016     Medications: I have reviewed the patient's current medications.  Assessment/Plan: 1. Gastric cancer, gastric cardia mass extendingto the GE junction confirmed on endoscopy 04/11/2016, biopsy confirmed adenocarcinoma  Staging CTs of the chest, abdomen, and pelvis 04/09/2016-gastric cardia mass, mediastinal, right  hilar, and right supraclavicular lymphadenopathy  Biopsy of a right supraclavicular lymph node on 04/20/2016 revealed metastatic adenocarcinoma  FoundationACT 02/20/2017-TP53 alteration identified  Cycle 1 FOLFOX 04/30/2016 (oxaliplatin given 04/30/2016, 5-FU infusion started 05/02/2016)  Cycle 2 FOLFOX 05/14/2016  Cycle 3 FOLFOX 05/30/2016  Cycle 4 FOLFOX 06/13/2016  Cycle 5 FOLFOX 06/27/2016  Cycle 6 FOLFOX 07/11/2016  CTs 07/20/2016-decrease in supraclavicular, mediastinal, and gastrohepatic adenopathy. Decreased gastric cardia wall thickening.  Cycle 7 FOLFOX 07/25/2016  Cycle 8 FOLFOX 08/08/2016  Cycle 9 FOLFOX 08/22/2016 (oxaliplatin held due to neuropathy)  Cycle 10 FOLFOX 09/05/2016 (oxaliplatin held)  Cycle 11 FOLFOX 09/26/2016 (oxaliplatin held)  Cycle 12 FOLFOX 10/10/2016 (oxaliplatin held)  CT 10/18/2016-mild enlargement of mediastinal lymph nodes, no other evidence of disease progression, stable thickening at the GE junction and stable gastrohepatic lymphadenopathy  Cycle 13 FOLFOX 10/24/2016 (oxaliplatin held)  Maintenance Xeloda 7 days on/7 days off beginning 11/17/2016  Restaging chest CT 01/22/2017-interval progression of metastatic lymphadenopathy in the mediastinum and gastrohepatic ligament.  Salvage therapy with FOLFIRI initiated 02/20/2017  Cycle 5 FOLFIRI 04/17/2017  Restaging chest CT 04/29/2017-mild improvement in mediastinal adenopathy. No significant change in adenopathy in the gastrohepatic ligament. No evidence of disease progression.  Cycle 6 FOLFIRI 05/01/2017  Cycle 7 FOLFIRI 05/15/2017  Cycle 8 FOLFIRI 05/29/2017  Cycle 9 FOLFIRI 06/19/2017  Cycle 10 FOLFIRI 07/03/2017  2. Anemia secondary to #1-improved  3. Diabetes  4. Gout  5. Hypertension  6. Hyperlipidemia  7. History of a T7 compression fracture  8. Pain secondary to the gastric mass and mediastinal lymphadenopathy-resolved  9.  Port-A-Cath placement 04/20/2016  10. 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 1 mm obstructing stone distal right ureter/right ureteral vesicle junction on CT 09/13/2016.  13. 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 Hypercalcemia 12/19/2016-asymptomatic  15. Nausea following FOLFIRI-emend added beginning with cycle 3, trial of Reglan beginning 07/03/2017    Disposition:  Mr. Stormont appears stable. The plan is to continue FOLFIRI chemotherapy. He will be scheduled for a restaging CT after the cycle of chemotherapy 07/17/2017.  He continues to have delayed nausea. He does not wish to begin a trial of Decadron secondary to hyperglycemia. He will try Reglan. He will discontinue Compazine 1 taking Reglan. We discussed the potential for tardive dyskinesia with Reglan. He agrees to proceed.  Mr. Milstein will return for an office visit and chemotherapy in 2 weeks.  Donneta Romberg, MD  07/03/2017  9:32 AM

## 2017-07-04 ENCOUNTER — Telehealth: Payer: Self-pay | Admitting: Oncology

## 2017-07-04 NOTE — Telephone Encounter (Signed)
07/03/17 prescription refill scanned in media to view °

## 2017-07-05 ENCOUNTER — Ambulatory Visit (HOSPITAL_BASED_OUTPATIENT_CLINIC_OR_DEPARTMENT_OTHER): Payer: BLUE CROSS/BLUE SHIELD

## 2017-07-05 VITALS — BP 127/81 | HR 93 | Temp 97.8°F | Resp 18

## 2017-07-05 DIAGNOSIS — C160 Malignant neoplasm of cardia: Secondary | ICD-10-CM | POA: Diagnosis not present

## 2017-07-05 MED ORDER — SODIUM CHLORIDE 0.9% FLUSH
10.0000 mL | INTRAVENOUS | Status: DC | PRN
  Administered 2017-07-05: 10 mL
  Filled 2017-07-05: qty 10

## 2017-07-05 MED ORDER — HEPARIN SOD (PORK) LOCK FLUSH 100 UNIT/ML IV SOLN
500.0000 [IU] | Freq: Once | INTRAVENOUS | Status: AC | PRN
  Administered 2017-07-05: 500 [IU]
  Filled 2017-07-05: qty 5

## 2017-07-14 ENCOUNTER — Other Ambulatory Visit: Payer: Self-pay | Admitting: Oncology

## 2017-07-17 ENCOUNTER — Ambulatory Visit (HOSPITAL_BASED_OUTPATIENT_CLINIC_OR_DEPARTMENT_OTHER): Payer: BLUE CROSS/BLUE SHIELD | Admitting: Oncology

## 2017-07-17 ENCOUNTER — Ambulatory Visit: Payer: BLUE CROSS/BLUE SHIELD

## 2017-07-17 ENCOUNTER — Ambulatory Visit (HOSPITAL_BASED_OUTPATIENT_CLINIC_OR_DEPARTMENT_OTHER): Payer: BLUE CROSS/BLUE SHIELD

## 2017-07-17 ENCOUNTER — Other Ambulatory Visit (HOSPITAL_BASED_OUTPATIENT_CLINIC_OR_DEPARTMENT_OTHER): Payer: BLUE CROSS/BLUE SHIELD

## 2017-07-17 VITALS — BP 130/78 | HR 89 | Temp 97.9°F | Resp 17 | Ht 66.0 in | Wt 167.7 lb

## 2017-07-17 DIAGNOSIS — R110 Nausea: Secondary | ICD-10-CM

## 2017-07-17 DIAGNOSIS — G620 Drug-induced polyneuropathy: Secondary | ICD-10-CM | POA: Diagnosis not present

## 2017-07-17 DIAGNOSIS — E119 Type 2 diabetes mellitus without complications: Secondary | ICD-10-CM | POA: Diagnosis not present

## 2017-07-17 DIAGNOSIS — I10 Essential (primary) hypertension: Secondary | ICD-10-CM

## 2017-07-17 DIAGNOSIS — C778 Secondary and unspecified malignant neoplasm of lymph nodes of multiple regions: Secondary | ICD-10-CM

## 2017-07-17 DIAGNOSIS — C160 Malignant neoplasm of cardia: Secondary | ICD-10-CM

## 2017-07-17 DIAGNOSIS — Z5111 Encounter for antineoplastic chemotherapy: Secondary | ICD-10-CM | POA: Diagnosis not present

## 2017-07-17 DIAGNOSIS — Z95828 Presence of other vascular implants and grafts: Secondary | ICD-10-CM

## 2017-07-17 LAB — CBC WITH DIFFERENTIAL/PLATELET
BASO%: 0.8 % (ref 0.0–2.0)
Basophils Absolute: 0.1 10*3/uL (ref 0.0–0.1)
EOS%: 2.6 % (ref 0.0–7.0)
Eosinophils Absolute: 0.2 10*3/uL (ref 0.0–0.5)
HCT: 37.5 % — ABNORMAL LOW (ref 38.4–49.9)
HGB: 13 g/dL (ref 13.0–17.1)
LYMPH%: 22.8 % (ref 14.0–49.0)
MCH: 29.9 pg (ref 27.2–33.4)
MCHC: 34.7 g/dL (ref 32.0–36.0)
MCV: 86.2 fL (ref 79.3–98.0)
MONO#: 0.6 10*3/uL (ref 0.1–0.9)
MONO%: 8.7 % (ref 0.0–14.0)
NEUT%: 65.1 % (ref 39.0–75.0)
NEUTROS ABS: 4.4 10*3/uL (ref 1.5–6.5)
PLATELETS: 210 10*3/uL (ref 140–400)
RBC: 4.35 10*6/uL (ref 4.20–5.82)
RDW: 15.4 % — ABNORMAL HIGH (ref 11.0–14.6)
WBC: 6.8 10*3/uL (ref 4.0–10.3)
lymph#: 1.5 10*3/uL (ref 0.9–3.3)

## 2017-07-17 LAB — COMPREHENSIVE METABOLIC PANEL
ALBUMIN: 4.1 g/dL (ref 3.5–5.0)
ALK PHOS: 84 U/L (ref 40–150)
ALT: 30 U/L (ref 0–55)
AST: 22 U/L (ref 5–34)
Anion Gap: 11 mEq/L (ref 3–11)
BILIRUBIN TOTAL: 0.46 mg/dL (ref 0.20–1.20)
BUN: 9.2 mg/dL (ref 7.0–26.0)
CO2: 26 meq/L (ref 22–29)
Calcium: 9.8 mg/dL (ref 8.4–10.4)
Chloride: 105 mEq/L (ref 98–109)
Creatinine: 0.8 mg/dL (ref 0.7–1.3)
GLUCOSE: 143 mg/dL — AB (ref 70–140)
Potassium: 3.5 mEq/L (ref 3.5–5.1)
SODIUM: 142 meq/L (ref 136–145)
TOTAL PROTEIN: 6.8 g/dL (ref 6.4–8.3)

## 2017-07-17 MED ORDER — SODIUM CHLORIDE 0.9 % IV SOLN
Freq: Once | INTRAVENOUS | Status: AC
  Administered 2017-07-17: 11:00:00 via INTRAVENOUS
  Filled 2017-07-17: qty 5

## 2017-07-17 MED ORDER — ATROPINE SULFATE 1 MG/ML IJ SOLN
INTRAMUSCULAR | Status: AC
  Filled 2017-07-17: qty 1

## 2017-07-17 MED ORDER — ATROPINE SULFATE 1 MG/ML IJ SOLN
0.5000 mg | Freq: Once | INTRAMUSCULAR | Status: AC | PRN
  Administered 2017-07-17: 0.5 mg via INTRAVENOUS

## 2017-07-17 MED ORDER — FLUOROURACIL CHEMO INJECTION 2.5 GM/50ML
400.0000 mg/m2 | Freq: Once | INTRAVENOUS | Status: AC
  Administered 2017-07-17: 750 mg via INTRAVENOUS
  Filled 2017-07-17: qty 15

## 2017-07-17 MED ORDER — SODIUM CHLORIDE 0.9% FLUSH
10.0000 mL | INTRAVENOUS | Status: DC | PRN
  Administered 2017-07-17: 10 mL via INTRAVENOUS
  Filled 2017-07-17: qty 10

## 2017-07-17 MED ORDER — SODIUM CHLORIDE 0.9 % IV SOLN
180.0000 mg/m2 | Freq: Once | INTRAVENOUS | Status: AC
  Administered 2017-07-17: 340 mg via INTRAVENOUS
  Filled 2017-07-17: qty 17

## 2017-07-17 MED ORDER — SODIUM CHLORIDE 0.9 % IV SOLN
400.0000 mg/m2 | Freq: Once | INTRAVENOUS | Status: AC
  Administered 2017-07-17: 764 mg via INTRAVENOUS
  Filled 2017-07-17: qty 38.2

## 2017-07-17 MED ORDER — SODIUM CHLORIDE 0.9 % IV SOLN
2400.0000 mg/m2 | INTRAVENOUS | Status: DC
  Administered 2017-07-17: 4600 mg via INTRAVENOUS
  Filled 2017-07-17: qty 92

## 2017-07-17 MED ORDER — PALONOSETRON HCL INJECTION 0.25 MG/5ML
0.2500 mg | Freq: Once | INTRAVENOUS | Status: AC
  Administered 2017-07-17: 0.25 mg via INTRAVENOUS

## 2017-07-17 MED ORDER — SODIUM CHLORIDE 0.9 % IV SOLN
Freq: Once | INTRAVENOUS | Status: AC
Start: 2017-07-17 — End: 2017-07-17
  Administered 2017-07-17: 11:00:00 via INTRAVENOUS

## 2017-07-17 MED ORDER — PALONOSETRON HCL INJECTION 0.25 MG/5ML
INTRAVENOUS | Status: AC
  Filled 2017-07-17: qty 5

## 2017-07-17 NOTE — Patient Instructions (Signed)
[facility] Discharge Instructions for Patients Receiving Chemotherapy  Today you received the following chemotherapy agents leucovorin/irinotecan/florouracil   To help prevent nausea and vomiting after your treatment, we encourage you to take your nausea medication as directed  If you develop nausea and vomiting that is not controlled by your nausea medication, call the clinic.   BELOW ARE SYMPTOMS THAT SHOULD BE REPORTED IMMEDIATELY:  *FEVER GREATER THAN 100.5 F  *CHILLS WITH OR WITHOUT FEVER  NAUSEA AND VOMITING THAT IS NOT CONTROLLED WITH YOUR NAUSEA MEDICATION  *UNUSUAL SHORTNESS OF BREATH  *UNUSUAL BRUISING OR BLEEDING  TENDERNESS IN MOUTH AND THROAT WITH OR WITHOUT PRESENCE OF ULCERS  *URINARY PROBLEMS  *BOWEL PROBLEMS  UNUSUAL RASH Items with * indicate a potential emergency and should be followed up as soon as possible.  Feel free to call the clinic you have any questions or concerns. The clinic phone number is [phone].  

## 2017-07-17 NOTE — Progress Notes (Signed)
[place] OFFICE PROGRESS NOTE   Diagnosis: Gastric cancer  INTERVAL HISTORY:   Mr. Hislop returns as scheduled. He completed another cycle of FOLFIRI on 07/03/2017. He had nausea and 3-4 episodes of emesis following chemotherapy. He did not try metoclopramide. He otherwise feels well. No dysphagia or pain. The hoarseness continues to improve.  Objective:  Vital signs in last 24 hours:  Blood pressure 130/78, pulse 89, temperature 97.9 F (36.6 C), temperature source Oral, resp. rate 17, height '5\' 6"'  (1.676 m), weight 167 lb 11.2 oz (76.1 kg), SpO2 98 %.    HEENT: No thrush or ulcers Lymphatics: No cervical or supraclavicular nodes Resp: Lungs clear bilaterally Cardio: Regular rate and rhythm GI: No hepatosplenomegaly, nontender Vascular: No leg edema  Skin: Palms without erythema   Portacath/PICC-without erythema  Lab Results:  Lab Results  Component Value Date   WBC 6.8 07/17/2017   HGB 13.0 07/17/2017   HCT 37.5 (L) 07/17/2017   MCV 86.2 07/17/2017   PLT 210 07/17/2017   NEUTROABS 4.4 07/17/2017    CMP     Component Value Date/Time   NA 143 07/03/2017 0807   K 3.5 07/03/2017 0807   CL 105 09/13/2016 1000   CO2 23 07/03/2017 0807   GLUCOSE 114 07/03/2017 0807   BUN 7.8 07/03/2017 0807   CREATININE 0.7 07/03/2017 0807   CALCIUM 9.7 07/03/2017 0807   PROT 7.1 07/03/2017 0807   ALBUMIN 4.0 07/03/2017 0807   AST 22 07/03/2017 0807   ALT 25 07/03/2017 0807   ALKPHOS 88 07/03/2017 0807   BILITOT 0.38 07/03/2017 0807   GFRNONAA >60 09/13/2016 1000   GFRAA >60 09/13/2016 1000     Medications: I have reviewed the patient's current medications.  Assessment/Plan: 1. Gastric cancer, gastric cardia mass extendingto the GE junction confirmed on endoscopy 04/11/2016, biopsy confirmed adenocarcinoma  Staging CTs of the chest, abdomen, and pelvis 04/09/2016-gastric cardia mass, mediastinal, right hilar, and right supraclavicular  lymphadenopathy  Biopsy of a right supraclavicular lymph node on 04/20/2016 revealed metastatic adenocarcinoma  FoundationACT 02/20/2017-TP53 alteration identified  Cycle 1 FOLFOX 04/30/2016 (oxaliplatin given 04/30/2016, 5-FU infusion started 05/02/2016)  Cycle 2 FOLFOX 05/14/2016  Cycle 3 FOLFOX 05/30/2016  Cycle 4 FOLFOX 06/13/2016  Cycle 5 FOLFOX 06/27/2016  Cycle 6 FOLFOX 07/11/2016  CTs 07/20/2016-decrease in supraclavicular, mediastinal, and gastrohepatic adenopathy. Decreased gastric cardia wall thickening.  Cycle 7 FOLFOX 07/25/2016  Cycle 8 FOLFOX 08/08/2016  Cycle 9 FOLFOX 08/22/2016 (oxaliplatin held due to neuropathy)  Cycle 10 FOLFOX 09/05/2016 (oxaliplatin held)  Cycle 11 FOLFOX 09/26/2016 (oxaliplatin held)  Cycle 12 FOLFOX 10/10/2016 (oxaliplatin held)  CT 10/18/2016-mild enlargement of mediastinal lymph nodes, no other evidence of disease progression, stable thickening at the GE junction and stable gastrohepatic lymphadenopathy  Cycle 13 FOLFOX 10/24/2016 (oxaliplatin held)  Maintenance Xeloda 7 days on/7 days off beginning 11/17/2016  Restaging chest CT 01/22/2017-interval progression of metastatic lymphadenopathy in the mediastinum and gastrohepatic ligament.  Salvage therapy with FOLFIRI initiated 02/20/2017  Cycle 5 FOLFIRI 04/17/2017  Restaging chest CT 04/29/2017-mild improvement in mediastinal adenopathy. No significant change in adenopathy in the gastrohepatic ligament. No evidence of disease progression.  Cycle 6 FOLFIRI 05/01/2017  Cycle 7 FOLFIRI 05/15/2017  Cycle 8 FOLFIRI 05/29/2017  Cycle 9 FOLFIRI 06/19/2017  Cycle 10 FOLFIRI 07/03/2017  Cycle 11 FOLFIRI 07/17/2017  2. Anemia secondary to #1-improved  3. Diabetes  4. Gout  5. Hypertension  6. Hyperlipidemia  7. History of a T7 compression fracture  8. Pain secondary to  the gastric mass and mediastinal lymphadenopathy-resolved  9.  Port-A-Cath placement 04/20/2016  10. 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 1 mm obstructing stone distal right ureter/right ureteral vesicle junction on CT 09/13/2016.  13. 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 Hypercalcemia 12/19/2016-asymptomatic  15. Nausea following FOLFIRI-emend added beginning with cycle 3, trial of Reglan beginning 07/17/2017    Disposition:  Mr. Constante appears stable. He will try Reglan for delayed nausea following this cycle of chemotherapy. He will undergo a restaging CT prior to an office visit 07/31/2017.  15 minutes were spent with the patient today. The majority of the time was used for counseling and coordination of care.  Donneta Romberg, MD  07/17/2017  9:35 AM

## 2017-07-18 ENCOUNTER — Telehealth: Payer: Self-pay | Admitting: Oncology

## 2017-07-18 NOTE — Telephone Encounter (Signed)
No additional appts to schedule per 10/24 los- appt already scheduled - central radiology to contact patient with ct scan

## 2017-07-19 ENCOUNTER — Ambulatory Visit (HOSPITAL_BASED_OUTPATIENT_CLINIC_OR_DEPARTMENT_OTHER): Payer: BLUE CROSS/BLUE SHIELD

## 2017-07-19 VITALS — BP 132/75 | HR 85 | Temp 98.0°F | Resp 18

## 2017-07-19 DIAGNOSIS — C160 Malignant neoplasm of cardia: Secondary | ICD-10-CM | POA: Diagnosis not present

## 2017-07-19 MED ORDER — SODIUM CHLORIDE 0.9% FLUSH
10.0000 mL | INTRAVENOUS | Status: DC | PRN
  Administered 2017-07-19: 10 mL
  Filled 2017-07-19: qty 10

## 2017-07-19 MED ORDER — HEPARIN SOD (PORK) LOCK FLUSH 100 UNIT/ML IV SOLN
500.0000 [IU] | Freq: Once | INTRAVENOUS | Status: AC | PRN
  Administered 2017-07-19: 500 [IU]
  Filled 2017-07-19: qty 5

## 2017-07-28 ENCOUNTER — Other Ambulatory Visit: Payer: Self-pay | Admitting: Oncology

## 2017-07-30 ENCOUNTER — Ambulatory Visit (HOSPITAL_COMMUNITY)
Admission: RE | Admit: 2017-07-30 | Discharge: 2017-07-30 | Disposition: A | Discharge status: Home / Self Care | Payer: BLUE CROSS/BLUE SHIELD | Source: Ambulatory Visit | Attending: Oncology | Admitting: Oncology

## 2017-07-30 ENCOUNTER — Encounter (HOSPITAL_COMMUNITY): Payer: Self-pay

## 2017-07-30 DIAGNOSIS — C160 Malignant neoplasm of cardia: Secondary | ICD-10-CM | POA: Diagnosis present

## 2017-07-30 DIAGNOSIS — R590 Localized enlarged lymph nodes: Secondary | ICD-10-CM | POA: Insufficient documentation

## 2017-07-30 MED ORDER — IOPAMIDOL (ISOVUE-300) INJECTION 61%
INTRAVENOUS | Status: AC
  Administered 2017-07-30: 75 mL via INTRAVENOUS
  Filled 2017-07-30: qty 75

## 2017-07-30 MED ORDER — IOPAMIDOL (ISOVUE-300) INJECTION 61%
75.0000 mL | Freq: Once | INTRAVENOUS | Status: AC | PRN
Start: 2017-07-30 — End: 2017-07-30
  Administered 2017-07-30: 75 mL via INTRAVENOUS

## 2017-07-31 ENCOUNTER — Telehealth: Payer: Self-pay | Admitting: Oncology

## 2017-07-31 ENCOUNTER — Ambulatory Visit (HOSPITAL_BASED_OUTPATIENT_CLINIC_OR_DEPARTMENT_OTHER): Payer: BLUE CROSS/BLUE SHIELD | Admitting: Oncology

## 2017-07-31 ENCOUNTER — Ambulatory Visit (HOSPITAL_BASED_OUTPATIENT_CLINIC_OR_DEPARTMENT_OTHER): Payer: BLUE CROSS/BLUE SHIELD

## 2017-07-31 ENCOUNTER — Other Ambulatory Visit (HOSPITAL_BASED_OUTPATIENT_CLINIC_OR_DEPARTMENT_OTHER): Payer: BLUE CROSS/BLUE SHIELD

## 2017-07-31 ENCOUNTER — Ambulatory Visit: Payer: BLUE CROSS/BLUE SHIELD

## 2017-07-31 VITALS — BP 126/82

## 2017-07-31 VITALS — BP 156/97 | HR 88 | Temp 98.1°F | Resp 18 | Ht 66.0 in | Wt 168.7 lb

## 2017-07-31 DIAGNOSIS — C160 Malignant neoplasm of cardia: Secondary | ICD-10-CM

## 2017-07-31 DIAGNOSIS — E119 Type 2 diabetes mellitus without complications: Secondary | ICD-10-CM

## 2017-07-31 DIAGNOSIS — G620 Drug-induced polyneuropathy: Secondary | ICD-10-CM

## 2017-07-31 DIAGNOSIS — I10 Essential (primary) hypertension: Secondary | ICD-10-CM | POA: Diagnosis not present

## 2017-07-31 DIAGNOSIS — Z5111 Encounter for antineoplastic chemotherapy: Secondary | ICD-10-CM

## 2017-07-31 DIAGNOSIS — C778 Secondary and unspecified malignant neoplasm of lymph nodes of multiple regions: Secondary | ICD-10-CM | POA: Diagnosis not present

## 2017-07-31 LAB — CBC WITH DIFFERENTIAL/PLATELET
BASO%: 0.6 % (ref 0.0–2.0)
BASOS ABS: 0 10*3/uL (ref 0.0–0.1)
EOS%: 2.1 % (ref 0.0–7.0)
Eosinophils Absolute: 0.1 10*3/uL (ref 0.0–0.5)
HCT: 37 % — ABNORMAL LOW (ref 38.4–49.9)
HEMOGLOBIN: 12.7 g/dL — AB (ref 13.0–17.1)
LYMPH%: 25.8 % (ref 14.0–49.0)
MCH: 29.7 pg (ref 27.2–33.4)
MCHC: 34.4 g/dL (ref 32.0–36.0)
MCV: 86.2 fL (ref 79.3–98.0)
MONO#: 0.6 10*3/uL (ref 0.1–0.9)
MONO%: 8.3 % (ref 0.0–14.0)
NEUT#: 4.2 10*3/uL (ref 1.5–6.5)
NEUT%: 63.2 % (ref 39.0–75.0)
Platelets: 207 10*3/uL (ref 140–400)
RBC: 4.29 10*6/uL (ref 4.20–5.82)
RDW: 16.4 % — AB (ref 11.0–14.6)
WBC: 6.7 10*3/uL (ref 4.0–10.3)
lymph#: 1.7 10*3/uL (ref 0.9–3.3)

## 2017-07-31 LAB — COMPREHENSIVE METABOLIC PANEL
ALBUMIN: 3.9 g/dL (ref 3.5–5.0)
ALK PHOS: 88 U/L (ref 40–150)
ALT: 30 U/L (ref 0–55)
ANION GAP: 11 meq/L (ref 3–11)
AST: 24 U/L (ref 5–34)
BILIRUBIN TOTAL: 0.31 mg/dL (ref 0.20–1.20)
BUN: 5.5 mg/dL — ABNORMAL LOW (ref 7.0–26.0)
CALCIUM: 9.6 mg/dL (ref 8.4–10.4)
CO2: 24 meq/L (ref 22–29)
CREATININE: 0.8 mg/dL (ref 0.7–1.3)
Chloride: 108 mEq/L (ref 98–109)
Glucose: 155 mg/dl — ABNORMAL HIGH (ref 70–140)
Potassium: 3.6 mEq/L (ref 3.5–5.1)
Sodium: 143 mEq/L (ref 136–145)
TOTAL PROTEIN: 7 g/dL (ref 6.4–8.3)

## 2017-07-31 MED ORDER — ATROPINE SULFATE 1 MG/ML IJ SOLN
0.5000 mg | Freq: Once | INTRAMUSCULAR | Status: AC | PRN
  Administered 2017-07-31: 0.5 mg via INTRAVENOUS

## 2017-07-31 MED ORDER — ATROPINE SULFATE 1 MG/ML IJ SOLN
INTRAMUSCULAR | Status: AC
Start: 2017-07-31 — End: 2017-07-31
  Filled 2017-07-31: qty 1

## 2017-07-31 MED ORDER — FLUOROURACIL CHEMO INJECTION 2.5 GM/50ML
400.0000 mg/m2 | Freq: Once | INTRAVENOUS | Status: AC
  Administered 2017-07-31: 750 mg via INTRAVENOUS
  Filled 2017-07-31: qty 15

## 2017-07-31 MED ORDER — SODIUM CHLORIDE 0.9 % IV SOLN
2400.0000 mg/m2 | INTRAVENOUS | Status: DC
  Administered 2017-07-31: 4600 mg via INTRAVENOUS
  Filled 2017-07-31: qty 92

## 2017-07-31 MED ORDER — SODIUM CHLORIDE 0.9 % IV SOLN
Freq: Once | INTRAVENOUS | Status: AC
  Administered 2017-07-31: 12:00:00 via INTRAVENOUS
  Filled 2017-07-31: qty 5

## 2017-07-31 MED ORDER — SODIUM CHLORIDE 0.9 % IV SOLN
180.0000 mg/m2 | Freq: Once | INTRAVENOUS | Status: AC
  Administered 2017-07-31: 340 mg via INTRAVENOUS
  Filled 2017-07-31: qty 17

## 2017-07-31 MED ORDER — SODIUM CHLORIDE 0.9 % IV SOLN
Freq: Once | INTRAVENOUS | Status: AC
  Administered 2017-07-31: 11:00:00 via INTRAVENOUS

## 2017-07-31 MED ORDER — SODIUM CHLORIDE 0.9 % IV SOLN
400.0000 mg/m2 | Freq: Once | INTRAVENOUS | Status: AC
  Administered 2017-07-31: 764 mg via INTRAVENOUS
  Filled 2017-07-31: qty 38.2

## 2017-07-31 MED ORDER — PALONOSETRON HCL INJECTION 0.25 MG/5ML
INTRAVENOUS | Status: AC
  Filled 2017-07-31: qty 5

## 2017-07-31 MED ORDER — PALONOSETRON HCL INJECTION 0.25 MG/5ML
0.2500 mg | Freq: Once | INTRAVENOUS | Status: AC
  Administered 2017-07-31: 0.25 mg via INTRAVENOUS

## 2017-07-31 NOTE — Telephone Encounter (Signed)
Scheduled appt per 11/7 los and sch message - gave patient AVS and calender per los.

## 2017-07-31 NOTE — Progress Notes (Signed)
[place] OFFICE PROGRESS NOTE   Diagnosis: Gastric cancer  INTERVAL HISTORY:    [name] returns as scheduled.  He completed another cycle FOLFIRI 07/17/2017.  He reports mild diarrhea following chemotherapy.  No new complaint.  He continues to have neuropathy symptoms in the hands and feet.  His voice is improving.  Objective:  Vital signs in last 24 hours:  Blood pressure (!) 156/97, pulse 88, temperature 98.1 F (36.7 C), temperature source Oral, resp. rate 18, height _0  (1.676 m), weight 168 lb 11.2 oz (76.5 kg), SpO2 100 %.    HEENT: No thrush or ulcers Lymphatics: No cervical or supraclavicular nodes Resp: Lungs clear bilaterally Cardio: Regular rate and rhythm GI: No hepatomegaly, nontender Vascular: No leg edema   Portacath/PICC-without erythema  Lab Results:  Lab Results  Component Value Date   WBC 6.7 07/31/2017   HGB 12.7 (L) 07/31/2017   HCT 37.0 (L) 07/31/2017   MCV 86.2 07/31/2017   PLT 207 07/31/2017   NEUTROABS 4.2 07/31/2017    CMP     Component Value Date/Time   NA 143 07/31/2017 0857   K 3.6 07/31/2017 0857   CL 105 09/13/2016 1000   CO2 24 07/31/2017 0857   GLUCOSE 155 (H) 07/31/2017 0857   BUN 5.5 (L) 07/31/2017 0857   CREATININE 0.8 07/31/2017 0857   CALCIUM 9.6 07/31/2017 0857   PROT 7.0 07/31/2017 0857   ALBUMIN 3.9 07/31/2017 0857   AST 24 07/31/2017 0857   ALT 30 07/31/2017 0857   ALKPHOS 88 07/31/2017 0857   BILITOT 0.31 07/31/2017 0857   GFRNONAA >60 09/13/2016 1000   GFRAA >60 09/13/2016 1000      Imaging:  Ct Chest W Contrast  Result Date: 07/30/2017 CLINICAL DATA:  Cancer of the gastric cardia with extension to the EG junction. EXAM: CT CHEST WITH CONTRAST TECHNIQUE: Multidetector CT imaging of the chest was performed during intravenous contrast administration. CONTRAST:  75 cc Isovue 300 COMPARISON:  04/29/2017 FINDINGS: Cardiovascular: The heart size is normal. No pericardial effusion.  Coronary artery calcification is evident. Right Port-A-Cath tip is positioned in the distal SVC. Mediastinum/Nodes: 15 mm prevascular lymph node measured previously is 12 mm today. 17 mm right paratracheal lymph node measured on the prior exam is 16 mm today. AP window lymph node characterized previously as left lower paratracheal and measured on the prior study at 11 mm is now 8 mm. There is no hilar lymphadenopathy. The esophagus has normal imaging features. There is no axillary lymphadenopathy. Lungs/Pleura: No suspicious pulmonary nodule or mass. Upper Abdomen: Gastrohepatic ligament lymph node measured previously at 22 mm is now 16 mm when measured in the same dimension today. Tiny right adrenal nodule is stable. Musculoskeletal: Bone windows reveal no worrisome lytic or sclerotic osseous lesions. Midthoracic compression fracture is stable. IMPRESSION: 1. Continued further decrease and mediastinal lymphadenopathy with gastrohepatic ligament lymph node also measuring smaller on today's study. No new or progressive findings on today's exam. Electronically Signed   By: Misty Stanley M.D.   On: 07/30/2017 15:23  CT images reviewed  Medications: I have reviewed the patient's current medications.  Assessment/Plan: 1. Gastric cancer, gastric cardia mass extendingto the GE junction confirmed on endoscopy 04/11/2016, biopsy confirmed adenocarcinoma  Staging CTs of the chest, abdomen, and pelvis 04/09/2016-gastric cardia mass, mediastinal, right hilar, and right supraclavicular lymphadenopathy  Biopsy of a right supraclavicular lymph node on 04/20/2016 revealed metastatic adenocarcinoma  FoundationACT 02/20/2017-TP53 alteration identified  Cycle 1 FOLFOX 04/30/2016 (oxaliplatin  given 04/30/2016, 5-FU infusion started 05/02/2016)  Cycle 2 FOLFOX 05/14/2016  Cycle 3 FOLFOX 05/30/2016  Cycle 4 FOLFOX 06/13/2016  Cycle 5 FOLFOX 06/27/2016  Cycle 6 FOLFOX 07/11/2016  CTs 07/20/2016-decrease in  supraclavicular, mediastinal, and gastrohepatic adenopathy. Decreased gastric cardia wall thickening.  Cycle 7 FOLFOX 07/25/2016  Cycle 8 FOLFOX 08/08/2016  Cycle 9 FOLFOX 08/22/2016 (oxaliplatin held due to neuropathy)  Cycle 10 FOLFOX 09/05/2016 (oxaliplatin held)  Cycle 11 FOLFOX 09/26/2016 (oxaliplatin held)  Cycle 12 FOLFOX 10/10/2016 (oxaliplatin held)  CT 10/18/2016-mild enlargement of mediastinal lymph nodes, no other evidence of disease progression, stable thickening at the GE junction and stable gastrohepatic lymphadenopathy  Cycle 13 FOLFOX 10/24/2016 (oxaliplatin held)  Maintenance Xeloda 7 days on/7 days off beginning 11/17/2016  Restaging chest CT 01/22/2017-interval progression of metastatic lymphadenopathy in the mediastinum and gastrohepatic ligament.  Salvage therapy with FOLFIRI initiated 02/20/2017  Cycle 5 FOLFIRI 04/17/2017  Restaging chest CT 04/29/2017-mild improvement in mediastinal adenopathy. No significant change in adenopathy in the gastrohepatic ligament. No evidence of disease progression.  Cycle 6 FOLFIRI 05/01/2017  Cycle 7 FOLFIRI 05/15/2017  Cycle 8 FOLFIRI 05/29/2017  Cycle 9 FOLFIRI 06/19/2017  Cycle 10 FOLFIRI 07/03/2017  Cycle 11 FOLFIRI 07/17/2017  CT chest 07/30/2017-mild decrease in mediastinal and gastrohepatic ligament lymphadenopathy, no evidence of progressive disease  2. Anemia secondary to #1-improved  3. Diabetes  4. Gout  5. Hypertension  6. Hyperlipidemia  7. History of a T7 compression fracture  8. Pain secondary to the gastric mass and mediastinal lymphadenopathy-resolved  9. Port-A-Cath placement 04/20/2016  10. 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 1 mm obstructing stone distal right ureter/right ureteral vesicle junction on CT 09/13/2016.  13. Hoarseness 12/19/2016-potentially  related to mediastinal adenopathy-left vocal cord paralysis confirmed on ENT exam 12/27/2016-improved  14. Hypercalcemia 12/19/2016-asymptomatic  15. Nausea following FOLFIRI-emend added beginning with cycle 3, trial of Reglan beginning 07/17/2017    Disposition:  Mr. Ofarrell is stable.  The restaging CT shows no evidence of disease progression.  We discussed treatment options.  The plan is to continue FOLFIRI chemotherapy.  The FOLFIRI will be changed to a 3-week interval.  He will return for an office visit and chemotherapy in 3 weeks.  25 minutes were spent with the patient today.  The majority of the time was used for counseling and coordination of care.  Betsy Coder, MD  07/31/2017  11:00 AM

## 2017-07-31 NOTE — Patient Instructions (Signed)
[facility] Discharge Instructions for Patients Receiving Chemotherapy  Today you received the following chemotherapy agents Leucovorin,Irinotecan, Fluorouracil.  To help prevent nausea and vomiting after your treatment, we encourage you to take your nausea medication as prescribed.If you develop nausea and vomiting that is not controlled by your nausea medication, call the clinic.   BELOW ARE SYMPTOMS THAT SHOULD BE REPORTED IMMEDIATELY:  *FEVER GREATER THAN 100.5 F  *CHILLS WITH OR WITHOUT FEVER  NAUSEA AND VOMITING THAT IS NOT CONTROLLED WITH YOUR NAUSEA MEDICATION  *UNUSUAL SHORTNESS OF BREATH  *UNUSUAL BRUISING OR BLEEDING  TENDERNESS IN MOUTH AND THROAT WITH OR WITHOUT PRESENCE OF ULCERS  *URINARY PROBLEMS  *BOWEL PROBLEMS  UNUSUAL RASH Items with * indicate a potential emergency and should be followed up as soon as possible.  Feel free to call the clinic should you have any questions or concerns. The clinic phone number is [phone].  Please show the [facility] at check-in to the Emergency Department and triage nurse.

## 2017-08-02 ENCOUNTER — Ambulatory Visit (HOSPITAL_BASED_OUTPATIENT_CLINIC_OR_DEPARTMENT_OTHER): Payer: BLUE CROSS/BLUE SHIELD

## 2017-08-02 VITALS — BP 130/88 | HR 87 | Temp 98.2°F | Resp 18

## 2017-08-02 DIAGNOSIS — C160 Malignant neoplasm of cardia: Secondary | ICD-10-CM

## 2017-08-02 DIAGNOSIS — Z95828 Presence of other vascular implants and grafts: Secondary | ICD-10-CM

## 2017-08-02 DIAGNOSIS — C778 Secondary and unspecified malignant neoplasm of lymph nodes of multiple regions: Secondary | ICD-10-CM

## 2017-08-02 MED ORDER — SODIUM CHLORIDE 0.9% FLUSH
3.0000 mL | INTRAVENOUS | Status: DC | PRN
  Filled 2017-08-02: qty 10

## 2017-08-02 MED ORDER — HEPARIN SOD (PORK) LOCK FLUSH 100 UNIT/ML IV SOLN
500.0000 [IU] | Freq: Once | INTRAVENOUS | Status: AC | PRN
  Administered 2017-08-02: 500 [IU] via INTRAVENOUS
  Filled 2017-08-02: qty 5

## 2017-08-02 MED ORDER — SODIUM CHLORIDE 0.9% FLUSH
10.0000 mL | INTRAVENOUS | Status: DC | PRN
  Administered 2017-08-02: 10 mL via INTRAVENOUS
  Filled 2017-08-02: qty 10

## 2017-08-02 MED ORDER — HEPARIN SOD (PORK) LOCK FLUSH 100 UNIT/ML IV SOLN
250.0000 [IU] | Freq: Once | INTRAVENOUS | Status: DC | PRN
  Filled 2017-08-02: qty 5

## 2017-08-14 ENCOUNTER — Other Ambulatory Visit (HOSPITAL_BASED_OUTPATIENT_CLINIC_OR_DEPARTMENT_OTHER): Payer: BLUE CROSS/BLUE SHIELD

## 2017-08-14 ENCOUNTER — Telehealth: Payer: Self-pay | Admitting: Oncology

## 2017-08-14 ENCOUNTER — Ambulatory Visit: Payer: BLUE CROSS/BLUE SHIELD

## 2017-08-14 ENCOUNTER — Ambulatory Visit (HOSPITAL_BASED_OUTPATIENT_CLINIC_OR_DEPARTMENT_OTHER): Payer: BLUE CROSS/BLUE SHIELD | Admitting: Oncology

## 2017-08-14 ENCOUNTER — Ambulatory Visit (HOSPITAL_BASED_OUTPATIENT_CLINIC_OR_DEPARTMENT_OTHER): Payer: BLUE CROSS/BLUE SHIELD

## 2017-08-14 VITALS — BP 141/85 | HR 91 | Temp 98.7°F | Resp 18 | Ht 66.0 in | Wt 171.0 lb

## 2017-08-14 DIAGNOSIS — G620 Drug-induced polyneuropathy: Secondary | ICD-10-CM

## 2017-08-14 DIAGNOSIS — Z5111 Encounter for antineoplastic chemotherapy: Secondary | ICD-10-CM | POA: Diagnosis not present

## 2017-08-14 DIAGNOSIS — I10 Essential (primary) hypertension: Secondary | ICD-10-CM | POA: Diagnosis not present

## 2017-08-14 DIAGNOSIS — R110 Nausea: Secondary | ICD-10-CM | POA: Diagnosis not present

## 2017-08-14 DIAGNOSIS — C760 Malignant neoplasm of head, face and neck: Secondary | ICD-10-CM | POA: Diagnosis not present

## 2017-08-14 DIAGNOSIS — Z95828 Presence of other vascular implants and grafts: Secondary | ICD-10-CM

## 2017-08-14 DIAGNOSIS — C160 Malignant neoplasm of cardia: Secondary | ICD-10-CM

## 2017-08-14 DIAGNOSIS — E119 Type 2 diabetes mellitus without complications: Secondary | ICD-10-CM

## 2017-08-14 DIAGNOSIS — C778 Secondary and unspecified malignant neoplasm of lymph nodes of multiple regions: Secondary | ICD-10-CM

## 2017-08-14 LAB — CBC WITH DIFFERENTIAL/PLATELET
BASO%: 0.3 % (ref 0.0–2.0)
BASOS ABS: 0 10*3/uL (ref 0.0–0.1)
EOS%: 1.9 % (ref 0.0–7.0)
Eosinophils Absolute: 0.1 10*3/uL (ref 0.0–0.5)
HCT: 35.6 % — ABNORMAL LOW (ref 38.4–49.9)
HGB: 12.1 g/dL — ABNORMAL LOW (ref 13.0–17.1)
LYMPH%: 18.8 % (ref 14.0–49.0)
MCH: 29.4 pg (ref 27.2–33.4)
MCHC: 34 g/dL (ref 32.0–36.0)
MCV: 86.4 fL (ref 79.3–98.0)
MONO#: 0.7 10*3/uL (ref 0.1–0.9)
MONO%: 10.4 % (ref 0.0–14.0)
NEUT#: 4.3 10*3/uL (ref 1.5–6.5)
NEUT%: 68.6 % (ref 39.0–75.0)
Platelets: 154 10*3/uL (ref 140–400)
RBC: 4.12 10*6/uL — ABNORMAL LOW (ref 4.20–5.82)
RDW: 15.1 % — ABNORMAL HIGH (ref 11.0–14.6)
WBC: 6.3 10*3/uL (ref 4.0–10.3)
lymph#: 1.2 10*3/uL (ref 0.9–3.3)

## 2017-08-14 LAB — COMPREHENSIVE METABOLIC PANEL
ALT: 31 U/L (ref 0–55)
ANION GAP: 11 meq/L (ref 3–11)
AST: 20 U/L (ref 5–34)
Albumin: 3.7 g/dL (ref 3.5–5.0)
Alkaline Phosphatase: 85 U/L (ref 40–150)
BILIRUBIN TOTAL: 0.68 mg/dL (ref 0.20–1.20)
BUN: 6.7 mg/dL — AB (ref 7.0–26.0)
CO2: 27 meq/L (ref 22–29)
CREATININE: 0.8 mg/dL (ref 0.7–1.3)
Calcium: 9.4 mg/dL (ref 8.4–10.4)
Chloride: 105 mEq/L (ref 98–109)
EGFR: >60 mL/min/{1.73_m2} (ref >60)
GLUCOSE: 188 mg/dL — AB (ref 70–140)
Potassium: 3.4 mEq/L — ABNORMAL LOW (ref 3.5–5.1)
SODIUM: 143 meq/L (ref 136–145)
TOTAL PROTEIN: 6.6 g/dL (ref 6.4–8.3)

## 2017-08-14 MED ORDER — PALONOSETRON HCL INJECTION 0.25 MG/5ML
INTRAVENOUS | Status: AC
  Filled 2017-08-14: qty 5

## 2017-08-14 MED ORDER — IRINOTECAN HCL CHEMO INJECTION 100 MG/5ML
180.0000 mg/m2 | Freq: Once | INTRAVENOUS | Status: AC
  Administered 2017-08-14: 340 mg via INTRAVENOUS
  Filled 2017-08-14: qty 17

## 2017-08-14 MED ORDER — PALONOSETRON HCL INJECTION 0.25 MG/5ML
0.2500 mg | Freq: Once | INTRAVENOUS | Status: AC
  Administered 2017-08-14: 0.25 mg via INTRAVENOUS

## 2017-08-14 MED ORDER — ATROPINE SULFATE 1 MG/ML IJ SOLN
0.5000 mg | Freq: Once | INTRAMUSCULAR | Status: AC | PRN
  Administered 2017-08-14: 0.5 mg via INTRAVENOUS

## 2017-08-14 MED ORDER — SODIUM CHLORIDE 0.9 % IV SOLN
Freq: Once | INTRAVENOUS | Status: AC
  Administered 2017-08-14: 10:00:00 via INTRAVENOUS

## 2017-08-14 MED ORDER — SODIUM CHLORIDE 0.9 % IV SOLN
2400.0000 mg/m2 | INTRAVENOUS | Status: DC
  Administered 2017-08-14: 4600 mg via INTRAVENOUS
  Filled 2017-08-14: qty 92

## 2017-08-14 MED ORDER — SODIUM CHLORIDE 0.9 % IV SOLN
Freq: Once | INTRAVENOUS | Status: AC
  Administered 2017-08-14: 11:00:00 via INTRAVENOUS
  Filled 2017-08-14: qty 5

## 2017-08-14 MED ORDER — SODIUM CHLORIDE 0.9% FLUSH
10.0000 mL | INTRAVENOUS | Status: DC | PRN
  Administered 2017-08-14: 10 mL via INTRAVENOUS
  Filled 2017-08-14: qty 10

## 2017-08-14 MED ORDER — SODIUM CHLORIDE 0.9 % IV SOLN
400.0000 mg/m2 | Freq: Once | INTRAVENOUS | Status: AC
  Administered 2017-08-14: 764 mg via INTRAVENOUS
  Filled 2017-08-14: qty 38.2

## 2017-08-14 MED ORDER — FLUOROURACIL CHEMO INJECTION 2.5 GM/50ML
400.0000 mg/m2 | Freq: Once | INTRAVENOUS | Status: AC
  Administered 2017-08-14: 750 mg via INTRAVENOUS
  Filled 2017-08-14: qty 15

## 2017-08-14 MED ORDER — ATROPINE SULFATE 1 MG/ML IJ SOLN
INTRAMUSCULAR | Status: AC
  Filled 2017-08-14: qty 1

## 2017-08-14 NOTE — Telephone Encounter (Signed)
Gave avs and calendar for January 2019 °

## 2017-08-14 NOTE — Progress Notes (Signed)
[place] OFFICE PROGRESS NOTE   Diagnosis: Gastric cancer  INTERVAL HISTORY:   Mr. Klonowski completed another cycle of FOLFIRI on 07/31/2017.  He had nausea over the week following chemotherapy.  He has not tried metoclopramide.  No other complaint.  No dysphasia.  Neuropathy symptoms are partially improved.  Objective:  Vital signs in last 24 hours:  Blood pressure (!) 141/85, pulse 91, temperature 98.7 F (37.1 C), temperature source Oral, resp. rate 18, height '5\' 6"'  (1.676 m), weight 171 lb (77.6 kg), SpO2 94 %.    HEENT: No thrush or ulcers Resp: Lungs clear bilaterally Cardio: Regular rate and rhythm GI: No hepatomegaly, nontender Vascular: No leg edema   Portacath/PICC-without erythema  Lab Results:  Lab Results  Component Value Date   WBC 6.3 08/14/2017   HGB 12.1 (L) 08/14/2017   HCT 35.6 (L) 08/14/2017   MCV 86.4 08/14/2017   PLT 154 08/14/2017   NEUTROABS 4.3 08/14/2017    CMP     Component Value Date/Time   NA 143 08/14/2017 0831   K 3.4 (L) 08/14/2017 0831   CL 105 09/13/2016 1000   CO2 27 08/14/2017 0831   GLUCOSE 188 (H) 08/14/2017 0831   BUN 6.7 (L) 08/14/2017 0831   CREATININE 0.8 08/14/2017 0831   CALCIUM 9.4 08/14/2017 0831   PROT 6.6 08/14/2017 0831   ALBUMIN 3.7 08/14/2017 0831   AST 20 08/14/2017 0831   ALT 31 08/14/2017 0831   ALKPHOS 85 08/14/2017 0831   BILITOT 0.68 08/14/2017 0831   GFRNONAA >60 09/13/2016 1000   GFRAA >60 09/13/2016 1000    Lab Results  Component Value Date   CEA1 3.83 06/27/2016     Medications: I have reviewed the patient's current medications.  Assessment/Plan: 1. Gastric cancer, gastric cardia mass extendingto the GE junction confirmed on endoscopy 04/11/2016, biopsy confirmed adenocarcinoma  Staging CTs of the chest, abdomen, and pelvis 04/09/2016-gastric cardia mass, mediastinal, right hilar, and right supraclavicular lymphadenopathy  Biopsy of a right supraclavicular lymph node  on 04/20/2016 revealed metastatic adenocarcinoma  FoundationACT 02/20/2017-TP53 alteration identified  Cycle 1 FOLFOX 04/30/2016 (oxaliplatin given 04/30/2016, 5-FU infusion started 05/02/2016)  Cycle 2 FOLFOX 05/14/2016  Cycle 3 FOLFOX 05/30/2016  Cycle 4 FOLFOX 06/13/2016  Cycle 5 FOLFOX 06/27/2016  Cycle 6 FOLFOX 07/11/2016  CTs 07/20/2016-decrease in supraclavicular, mediastinal, and gastrohepatic adenopathy. Decreased gastric cardia wall thickening.  Cycle 7 FOLFOX 07/25/2016  Cycle 8 FOLFOX 08/08/2016  Cycle 9 FOLFOX 08/22/2016 (oxaliplatin held due to neuropathy)  Cycle 10 FOLFOX 09/05/2016 (oxaliplatin held)  Cycle 11 FOLFOX 09/26/2016 (oxaliplatin held)  Cycle 12 FOLFOX 10/10/2016 (oxaliplatin held)  CT 10/18/2016-mild enlargement of mediastinal lymph nodes, no other evidence of disease progression, stable thickening at the GE junction and stable gastrohepatic lymphadenopathy  Cycle 13 FOLFOX 10/24/2016 (oxaliplatin held)  Maintenance Xeloda 7 days on/7 days off beginning 11/17/2016  Restaging chest CT 01/22/2017-interval progression of metastatic lymphadenopathy in the mediastinum and gastrohepatic ligament.  Salvage therapy with FOLFIRI initiated 02/20/2017  Cycle 5 FOLFIRI 04/17/2017  Restaging chest CT 04/29/2017-mild improvement in mediastinal adenopathy. No significant change in adenopathy in the gastrohepatic ligament. No evidence of disease progression.  Cycle 6 FOLFIRI 05/01/2017  Cycle 7 FOLFIRI 05/15/2017  Cycle 8 FOLFIRI 05/29/2017  Cycle 9 FOLFIRI 06/19/2017  Cycle 10 FOLFIRI 07/03/2017  Cycle 11 FOLFIRI 07/17/2017  CT chest 07/30/2017-mild decrease in mediastinal and gastrohepatic ligament lymphadenopathy, no evidence of progressive disease  Cycle 12 FOLFIRI 07/31/2017  Cycle 13 FOLFIRI 08/14/2017  2. Anemia  secondary to #1-improved  3. Diabetes  4. Gout  5. Hypertension  6. Hyperlipidemia  7. History of a  T7 compression fracture  8. Pain secondary to the gastric mass and mediastinal lymphadenopathy-resolved  9. Port-A-Cath placement 04/20/2016  10. 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 1 mm obstructing stone distal right ureter/right ureteral vesicle junction on CT 09/13/2016.  13. 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 Hypercalcemia 12/19/2016-asymptomatic  15. Nausea following FOLFIRI-emend added beginning with cycle 3, trial of Reglan beginning11/21/2018   Disposition:  Mr. Winnett appears unchanged.  I encouraged him to try metoclopramide for delayed nausea following chemotherapy.  He will complete another cycle of FOLFIRI today.  Chemotherapy will then be changed to a 3-week interval.  He will return for an office visit and chemotherapy on 09/04/2017.  15 minutes were spent with the patient today.  The majority of the time was used for counseling and coordination of care.  Betsy Coder, MD  08/14/2017  9:39 AM

## 2017-08-14 NOTE — Patient Instructions (Signed)
[facility] Discharge Instructions for Patients Receiving Chemotherapy  Today you received the following chemotherapy agents:  Irinotecan, Leucovorin, and 5FU.   To help prevent nausea and vomiting after your treatment, we encourage you to take your nausea medication as directed.   If you develop nausea and vomiting that is not controlled by your nausea medication, call the clinic.   BELOW ARE SYMPTOMS THAT SHOULD BE REPORTED IMMEDIATELY:  *FEVER GREATER THAN 100.5 F  *CHILLS WITH OR WITHOUT FEVER  NAUSEA AND VOMITING THAT IS NOT CONTROLLED WITH YOUR NAUSEA MEDICATION  *UNUSUAL SHORTNESS OF BREATH  *UNUSUAL BRUISING OR BLEEDING  TENDERNESS IN MOUTH AND THROAT WITH OR WITHOUT PRESENCE OF ULCERS  *URINARY PROBLEMS  *BOWEL PROBLEMS  UNUSUAL RASH Items with * indicate a potential emergency and should be followed up as soon as possible.  Feel free to call the clinic should you have any questions or concerns. The clinic phone number is [phone].  Please show the CHEMO ALERT CARD at check-in to the Emergency Department and triage nurse.   

## 2017-08-16 ENCOUNTER — Ambulatory Visit (HOSPITAL_BASED_OUTPATIENT_CLINIC_OR_DEPARTMENT_OTHER): Payer: BLUE CROSS/BLUE SHIELD

## 2017-08-16 VITALS — BP 137/88 | HR 86 | Temp 98.5°F | Resp 19

## 2017-08-16 DIAGNOSIS — Z452 Encounter for adjustment and management of vascular access device: Secondary | ICD-10-CM | POA: Diagnosis not present

## 2017-08-16 DIAGNOSIS — C160 Malignant neoplasm of cardia: Secondary | ICD-10-CM | POA: Diagnosis not present

## 2017-08-16 DIAGNOSIS — C778 Secondary and unspecified malignant neoplasm of lymph nodes of multiple regions: Secondary | ICD-10-CM | POA: Diagnosis not present

## 2017-08-16 MED ORDER — SODIUM CHLORIDE 0.9% FLUSH
10.0000 mL | INTRAVENOUS | Status: DC | PRN
  Administered 2017-08-16: 10 mL
  Filled 2017-08-16: qty 10

## 2017-08-16 MED ORDER — HEPARIN SOD (PORK) LOCK FLUSH 100 UNIT/ML IV SOLN
500.0000 [IU] | Freq: Once | INTRAVENOUS | Status: AC | PRN
  Administered 2017-08-16: 500 [IU]
  Filled 2017-08-16: qty 5

## 2017-09-01 ENCOUNTER — Other Ambulatory Visit: Payer: Self-pay | Admitting: Oncology

## 2017-09-03 ENCOUNTER — Other Ambulatory Visit: Payer: Self-pay | Admitting: *Deleted

## 2017-09-03 DIAGNOSIS — C160 Malignant neoplasm of cardia: Secondary | ICD-10-CM

## 2017-09-04 ENCOUNTER — Encounter: Payer: Self-pay | Admitting: Nurse Practitioner

## 2017-09-04 ENCOUNTER — Ambulatory Visit: Payer: BLUE CROSS/BLUE SHIELD

## 2017-09-04 ENCOUNTER — Other Ambulatory Visit (HOSPITAL_BASED_OUTPATIENT_CLINIC_OR_DEPARTMENT_OTHER): Payer: BLUE CROSS/BLUE SHIELD

## 2017-09-04 ENCOUNTER — Telehealth: Payer: Self-pay | Admitting: Oncology

## 2017-09-04 ENCOUNTER — Other Ambulatory Visit: Payer: Self-pay | Admitting: Emergency Medicine

## 2017-09-04 ENCOUNTER — Ambulatory Visit (HOSPITAL_BASED_OUTPATIENT_CLINIC_OR_DEPARTMENT_OTHER): Payer: BLUE CROSS/BLUE SHIELD | Admitting: Nurse Practitioner

## 2017-09-04 ENCOUNTER — Ambulatory Visit (HOSPITAL_BASED_OUTPATIENT_CLINIC_OR_DEPARTMENT_OTHER): Payer: BLUE CROSS/BLUE SHIELD

## 2017-09-04 VITALS — HR 91

## 2017-09-04 VITALS — BP 110/75 | HR 102 | Temp 97.8°F | Resp 20 | Ht 66.0 in | Wt 167.8 lb

## 2017-09-04 DIAGNOSIS — G620 Drug-induced polyneuropathy: Secondary | ICD-10-CM | POA: Diagnosis not present

## 2017-09-04 DIAGNOSIS — R490 Dysphonia: Secondary | ICD-10-CM | POA: Diagnosis not present

## 2017-09-04 DIAGNOSIS — I10 Essential (primary) hypertension: Secondary | ICD-10-CM | POA: Diagnosis not present

## 2017-09-04 DIAGNOSIS — E119 Type 2 diabetes mellitus without complications: Secondary | ICD-10-CM

## 2017-09-04 DIAGNOSIS — R110 Nausea: Secondary | ICD-10-CM

## 2017-09-04 DIAGNOSIS — C778 Secondary and unspecified malignant neoplasm of lymph nodes of multiple regions: Secondary | ICD-10-CM | POA: Diagnosis not present

## 2017-09-04 DIAGNOSIS — C160 Malignant neoplasm of cardia: Secondary | ICD-10-CM

## 2017-09-04 DIAGNOSIS — Z5111 Encounter for antineoplastic chemotherapy: Secondary | ICD-10-CM

## 2017-09-04 LAB — CBC WITH DIFFERENTIAL/PLATELET
BASO%: 0.6 % (ref 0.0–2.0)
BASOS ABS: 0 10*3/uL (ref 0.0–0.1)
EOS ABS: 0.1 10*3/uL (ref 0.0–0.5)
EOS%: 1.8 % (ref 0.0–7.0)
HEMATOCRIT: 38.7 % (ref 38.4–49.9)
HEMOGLOBIN: 13.2 g/dL (ref 13.0–17.1)
LYMPH#: 1.6 10*3/uL (ref 0.9–3.3)
LYMPH%: 24.1 % (ref 14.0–49.0)
MCH: 29.8 pg (ref 27.2–33.4)
MCHC: 34.1 g/dL (ref 32.0–36.0)
MCV: 87.4 fL (ref 79.3–98.0)
MONO#: 0.9 10*3/uL (ref 0.1–0.9)
MONO%: 12.7 % (ref 0.0–14.0)
NEUT%: 60.8 % (ref 39.0–75.0)
NEUTROS ABS: 4.1 10*3/uL (ref 1.5–6.5)
Platelets: 238 10*3/uL (ref 140–400)
RBC: 4.43 10*6/uL (ref 4.20–5.82)
RDW: 15 % — AB (ref 11.0–14.6)
WBC: 6.7 10*3/uL (ref 4.0–10.3)

## 2017-09-04 LAB — COMPREHENSIVE METABOLIC PANEL
ALT: 40 U/L (ref 0–55)
ANION GAP: 13 meq/L — AB (ref 3–11)
AST: 29 U/L (ref 5–34)
Albumin: 4 g/dL (ref 3.5–5.0)
Alkaline Phosphatase: 96 U/L (ref 40–150)
BILIRUBIN TOTAL: 0.52 mg/dL (ref 0.20–1.20)
BUN: 9.8 mg/dL (ref 7.0–26.0)
CALCIUM: 10.1 mg/dL (ref 8.4–10.4)
CHLORIDE: 104 meq/L (ref 98–109)
CO2: 21 meq/L — AB (ref 22–29)
Creatinine: 0.8 mg/dL (ref 0.7–1.3)
Glucose: 226 mg/dl — ABNORMAL HIGH (ref 70–140)
Potassium: 3.9 mEq/L (ref 3.5–5.1)
Sodium: 139 mEq/L (ref 136–145)
Total Protein: 7.2 g/dL (ref 6.4–8.3)

## 2017-09-04 MED ORDER — ATROPINE SULFATE 1 MG/ML IJ SOLN
INTRAMUSCULAR | Status: AC
Start: 2017-09-04 — End: 2017-09-04
  Filled 2017-09-04: qty 1

## 2017-09-04 MED ORDER — PALONOSETRON HCL INJECTION 0.25 MG/5ML
0.2500 mg | Freq: Once | INTRAVENOUS | Status: AC
  Administered 2017-09-04: 0.25 mg via INTRAVENOUS

## 2017-09-04 MED ORDER — SODIUM CHLORIDE 0.9 % IV SOLN
Freq: Once | INTRAVENOUS | Status: AC
  Administered 2017-09-04: 11:00:00 via INTRAVENOUS

## 2017-09-04 MED ORDER — SODIUM CHLORIDE 0.9 % IV SOLN
Freq: Once | INTRAVENOUS | Status: AC
  Administered 2017-09-04: 11:00:00 via INTRAVENOUS
  Filled 2017-09-04: qty 5

## 2017-09-04 MED ORDER — SODIUM CHLORIDE 0.9 % IV SOLN
400.0000 mg/m2 | Freq: Once | INTRAVENOUS | Status: AC
  Administered 2017-09-04: 764 mg via INTRAVENOUS
  Filled 2017-09-04: qty 25

## 2017-09-04 MED ORDER — METOCLOPRAMIDE HCL 10 MG PO TABS: 10.0000 mg | ORAL_TABLET | Freq: Three times a day (TID) | ORAL | 2 refills | Status: DC

## 2017-09-04 MED ORDER — SODIUM CHLORIDE 0.9 % IV SOLN
180.0000 mg/m2 | Freq: Once | INTRAVENOUS | Status: AC
  Administered 2017-09-04: 340 mg via INTRAVENOUS
  Filled 2017-09-04: qty 17

## 2017-09-04 MED ORDER — FLUOROURACIL CHEMO INJECTION 2.5 GM/50ML
400.0000 mg/m2 | Freq: Once | INTRAVENOUS | Status: AC
  Administered 2017-09-04: 750 mg via INTRAVENOUS
  Filled 2017-09-04: qty 15

## 2017-09-04 MED ORDER — SODIUM CHLORIDE 0.9 % IV SOLN
2400.0000 mg/m2 | INTRAVENOUS | Status: DC
  Administered 2017-09-04: 4600 mg via INTRAVENOUS
  Filled 2017-09-04: qty 92

## 2017-09-04 MED ORDER — ATROPINE SULFATE 1 MG/ML IJ SOLN
0.5000 mg | Freq: Once | INTRAMUSCULAR | Status: AC | PRN
  Administered 2017-09-04: 0.5 mg via INTRAVENOUS

## 2017-09-04 MED ORDER — PALONOSETRON HCL INJECTION 0.25 MG/5ML
INTRAVENOUS | Status: AC
  Filled 2017-09-04: qty 5

## 2017-09-04 NOTE — Progress Notes (Signed)
Blood return noted before and after Adrucil push.  

## 2017-09-04 NOTE — Progress Notes (Signed)
[name]   Diagnosis:  Gastric cancer  INTERVAL HISTORY:   [name] returns as scheduled.  He completed another cycle of FOLFIRI 08/14/2017.  He had intermittent nausea for about a week.  No vomiting.  He did not try Reglan or Ativan.  No mouth sores.  He had mild diarrhea controlled with Imodium.  Hoarseness continues to be improved.  Objective:  Vital signs in last 24 hours:  Blood pressure 110/75, pulse (!) 102, temperature 97.8 F (36.6 C), temperature source Oral, resp. rate 20, height '5\' 6"'  (1.676 m), weight 167 lb 12.8 oz (76.1 kg), SpO2 98 %.    HEENT: No thrush or ulcers. Lymphatics: No palpable cervical or supraclavicular lymph nodes. Resp: Lungs clear bilaterally. Cardio: Regular rate and rhythm. GI: Abdomen soft and nontender.  No hepatomegaly. Vascular: No leg edema. Skin: Palms without erythema. Port-A-Cath without erythema.   Lab Results:  Lab Results  Component Value Date   WBC 6.7 09/04/2017   HGB 13.2 09/04/2017   HCT 38.7 09/04/2017   MCV 87.4 09/04/2017   PLT 238 09/04/2017   NEUTROABS 4.1 09/04/2017    Imaging:  No results found.  Medications: I have reviewed the patient's current medications.  Assessment/Plan: 1. 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 Decreased gastric cardia wall thickening.  Cycle 7 FOLFOX 07/25/2016  Cycle 8 FOLFOX 08/08/2016  Cycle 9 FOLFOX 08/22/2016 (oxaliplatin held due to neuropathy)  Cycle 10 FOLFOX 09/05/2016 (oxaliplatin held)  Cycle 11 FOLFOX 09/26/2016 (oxaliplatin held)  Cycle 12 FOLFOX 10/10/2016 (oxaliplatin held)  CT 10/18/2016-mild enlargement of mediastinal lymph nodes, no other evidence of disease progression, stable thickening at the GE junction and stable gastrohepatic lymphadenopathy  Cycle 13 FOLFOX 10/24/2016 (oxaliplatin held)  Maintenance Xeloda 7 days on/7 days off beginning 11/17/2016  Restaging chest CT 01/22/2017-interval progression of metastatic lymphadenopathy in the mediastinum and gastrohepatic ligament.  Salvage therapy with FOLFIRI initiated 02/20/2017  Cycle 5 FOLFIRI 04/17/2017  Restaging chest CT 04/29/2017-mild improvement in mediastinal adenopathy. No significant change in adenopathy in the gastrohepatic ligament. No evidence of disease progression.  Cycle 6 FOLFIRI 05/01/2017  Cycle 7 FOLFIRI 05/15/2017  Cycle 8 FOLFIRI 05/29/2017  Cycle 9 FOLFIRI 06/19/2017  Cycle 10 FOLFIRI 07/03/2017  Cycle 11 FOLFIRI 07/17/2017  CT chest 07/30/2017-mild decrease in mediastinal and gastrohepatic ligament lymphadenopathy, no evidence of progressive disease  Cycle 12 FOLFIRI 07/31/2017  Cycle 13 FOLFIRI 08/14/2017  Cycle 14 FOLFIRI 09/04/2017  2. Anemia secondary to #1-improved  3. Diabetes  4. Gout  5. Hypertension  6. Hyperlipidemia  7. History of a T7 compression fracture  8. Pain secondary to the gastric mass and mediastinal lymphadenopathy-resolved  9. Port-A-Cath placement 04/20/2016  10. 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 1 mm obstructing stone distal right  ureter/right ureteral vesicle junction on CT 09/13/2016.  13. 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 Hypercalcemia 12/19/2016-asymptomatic  15. Nausea following FOLFIRI-emend added beginning with cycle 3, trial of Reglan beginning12/08/2017   Disposition: Robert Beck appears stable.  He has completed 13 cycles of FOLFIRI.  Plan to proceed with cycle 14 today as scheduled.  He again had delayed nausea.  He plans to try Reglan this cycle.  He will return for a follow-up visit and the next cycle of chemotherapy in 3 weeks.  He will contact the office in the interim with any problems.  Ned Card ANP/GNP-BC   09/04/2017  10:31 AM

## 2017-09-04 NOTE — Telephone Encounter (Signed)
Scheduled appt per 12/12 los - Gave patient AVS and calender per los.  

## 2017-09-04 NOTE — Patient Instructions (Signed)
[facility] Discharge Instructions for Patients Receiving Chemotherapy  Today you received the following chemotherapy agents: Irinotecan (camptosar), Leucovorin, Fluorouracil (Adrucil, 5-FU)  To help prevent nausea and vomiting after your treatment, we encourage you to take your nausea medication as prescribed. If you develop nausea and vomiting that is not controlled by your nausea medication, call the clinic.   BELOW ARE SYMPTOMS THAT SHOULD BE REPORTED IMMEDIATELY:  *FEVER GREATER THAN 100.5 F  *CHILLS WITH OR WITHOUT FEVER  NAUSEA AND VOMITING THAT IS NOT CONTROLLED WITH YOUR NAUSEA MEDICATION  *UNUSUAL SHORTNESS OF BREATH  *UNUSUAL BRUISING OR BLEEDING  TENDERNESS IN MOUTH AND THROAT WITH OR WITHOUT PRESENCE OF ULCERS  *URINARY PROBLEMS  *BOWEL PROBLEMS  UNUSUAL RASH Items with * indicate a potential emergency and should be followed up as soon as possible.  Feel free to call the clinic should you have any questions or concerns. The clinic phone number is [phone].  Please show the [place] at check-in to the Emergency Department and triage nurse.

## 2017-09-06 ENCOUNTER — Ambulatory Visit (HOSPITAL_BASED_OUTPATIENT_CLINIC_OR_DEPARTMENT_OTHER): Payer: BLUE CROSS/BLUE SHIELD

## 2017-09-06 VITALS — BP 120/75 | HR 88 | Temp 97.9°F | Resp 18

## 2017-09-06 DIAGNOSIS — C160 Malignant neoplasm of cardia: Secondary | ICD-10-CM

## 2017-09-06 MED ORDER — HEPARIN SOD (PORK) LOCK FLUSH 100 UNIT/ML IV SOLN
500.0000 [IU] | Freq: Once | INTRAVENOUS | Status: AC | PRN
  Administered 2017-09-06: 500 [IU]
  Filled 2017-09-06: qty 5

## 2017-09-06 MED ORDER — SODIUM CHLORIDE 0.9% FLUSH
10.0000 mL | INTRAVENOUS | Status: DC | PRN
  Administered 2017-09-06: 10 mL
  Filled 2017-09-06: qty 10

## 2017-09-22 ENCOUNTER — Other Ambulatory Visit: Payer: Self-pay | Admitting: Oncology

## 2017-09-25 ENCOUNTER — Other Ambulatory Visit (HOSPITAL_BASED_OUTPATIENT_CLINIC_OR_DEPARTMENT_OTHER): Payer: BLUE CROSS/BLUE SHIELD

## 2017-09-25 ENCOUNTER — Ambulatory Visit (HOSPITAL_BASED_OUTPATIENT_CLINIC_OR_DEPARTMENT_OTHER): Payer: BLUE CROSS/BLUE SHIELD

## 2017-09-25 ENCOUNTER — Encounter: Payer: Self-pay | Admitting: Nurse Practitioner

## 2017-09-25 ENCOUNTER — Telehealth: Payer: Self-pay | Admitting: Nurse Practitioner

## 2017-09-25 ENCOUNTER — Ambulatory Visit: Payer: BLUE CROSS/BLUE SHIELD

## 2017-09-25 ENCOUNTER — Ambulatory Visit (HOSPITAL_BASED_OUTPATIENT_CLINIC_OR_DEPARTMENT_OTHER): Payer: BLUE CROSS/BLUE SHIELD | Admitting: Nurse Practitioner

## 2017-09-25 VITALS — BP 126/85 | HR 99 | Temp 97.5°F | Resp 17 | Ht 66.0 in | Wt 167.2 lb

## 2017-09-25 DIAGNOSIS — C160 Malignant neoplasm of cardia: Secondary | ICD-10-CM

## 2017-09-25 DIAGNOSIS — Z5111 Encounter for antineoplastic chemotherapy: Secondary | ICD-10-CM

## 2017-09-25 DIAGNOSIS — I10 Essential (primary) hypertension: Secondary | ICD-10-CM

## 2017-09-25 DIAGNOSIS — R110 Nausea: Secondary | ICD-10-CM | POA: Diagnosis not present

## 2017-09-25 DIAGNOSIS — C778 Secondary and unspecified malignant neoplasm of lymph nodes of multiple regions: Secondary | ICD-10-CM

## 2017-09-25 DIAGNOSIS — G620 Drug-induced polyneuropathy: Secondary | ICD-10-CM

## 2017-09-25 DIAGNOSIS — E119 Type 2 diabetes mellitus without complications: Secondary | ICD-10-CM

## 2017-09-25 LAB — CBC WITH DIFFERENTIAL/PLATELET
BASO%: 0.6 % (ref 0.0–2.0)
BASOS ABS: 0 10*3/uL (ref 0.0–0.1)
EOS ABS: 0.3 10*3/uL (ref 0.0–0.5)
EOS%: 4.5 % (ref 0.0–7.0)
HCT: 39.2 % (ref 38.4–49.9)
HGB: 13.6 g/dL (ref 13.0–17.1)
LYMPH%: 27.3 % (ref 14.0–49.0)
MCH: 29.8 pg (ref 27.2–33.4)
MCHC: 34.7 g/dL (ref 32.0–36.0)
MCV: 85.8 fL (ref 79.3–98.0)
MONO#: 0.8 10*3/uL (ref 0.1–0.9)
MONO%: 12.7 % (ref 0.0–14.0)
NEUT%: 54.9 % (ref 39.0–75.0)
NEUTROS ABS: 3.5 10*3/uL (ref 1.5–6.5)
PLATELETS: 239 10*3/uL (ref 140–400)
RBC: 4.57 10*6/uL (ref 4.20–5.82)
RDW: 14.5 % (ref 11.0–14.6)
WBC: 6.4 10*3/uL (ref 4.0–10.3)
lymph#: 1.8 10*3/uL (ref 0.9–3.3)

## 2017-09-25 LAB — COMPREHENSIVE METABOLIC PANEL
ALT: 33 U/L (ref 0–55)
AST: 22 U/L (ref 5–34)
Albumin: 4 g/dL (ref 3.5–5.0)
Alkaline Phosphatase: 118 U/L (ref 40–150)
Anion Gap: 11 mEq/L (ref 3–11)
BUN: 10.1 mg/dL (ref 7.0–26.0)
CALCIUM: 9.8 mg/dL (ref 8.4–10.4)
CO2: 22 meq/L (ref 22–29)
CREATININE: 0.9 mg/dL (ref 0.7–1.3)
Chloride: 108 mEq/L (ref 98–109)
EGFR: >60 mL/min/{1.73_m2} (ref >60)
GLUCOSE: 211 mg/dL — AB (ref 70–140)
POTASSIUM: 3.6 meq/L (ref 3.5–5.1)
SODIUM: 140 meq/L (ref 136–145)
Total Bilirubin: 0.39 mg/dL (ref 0.20–1.20)
Total Protein: 7.2 g/dL (ref 6.4–8.3)

## 2017-09-25 MED ORDER — SODIUM CHLORIDE 0.9 % IV SOLN
2400.0000 mg/m2 | INTRAVENOUS | Status: DC
  Administered 2017-09-25: 4600 mg via INTRAVENOUS
  Filled 2017-09-25: qty 92

## 2017-09-25 MED ORDER — ONDANSETRON HCL 8 MG PO TABS: 8.0000 mg | ORAL_TABLET | Freq: Three times a day (TID) | ORAL | 1 refills | Status: DC | PRN

## 2017-09-25 MED ORDER — PALONOSETRON HCL INJECTION 0.25 MG/5ML
INTRAVENOUS | Status: AC
  Filled 2017-09-25: qty 5

## 2017-09-25 MED ORDER — FLUOROURACIL CHEMO INJECTION 2.5 GM/50ML
400.0000 mg/m2 | Freq: Once | INTRAVENOUS | Status: AC
  Administered 2017-09-25: 750 mg via INTRAVENOUS
  Filled 2017-09-25: qty 15

## 2017-09-25 MED ORDER — PROCHLORPERAZINE MALEATE 10 MG PO TABS: 10.0000 mg | ORAL_TABLET | Freq: Four times a day (QID) | ORAL | 1 refills | Status: DC | PRN

## 2017-09-25 MED ORDER — SODIUM CHLORIDE 0.9 % IV SOLN
400.0000 mg/m2 | Freq: Once | INTRAVENOUS | Status: AC
  Administered 2017-09-25: 764 mg via INTRAVENOUS
  Filled 2017-09-25: qty 38.2

## 2017-09-25 MED ORDER — FOSAPREPITANT DIMEGLUMINE INJECTION 150 MG
Freq: Once | INTRAVENOUS | Status: AC
  Administered 2017-09-25: 10:00:00 via INTRAVENOUS
  Filled 2017-09-25: qty 5

## 2017-09-25 MED ORDER — SODIUM CHLORIDE 0.9% FLUSH
10.0000 mL | INTRAVENOUS | Status: AC | PRN
  Filled 2017-09-25: qty 10

## 2017-09-25 MED ORDER — ATROPINE SULFATE 1 MG/ML IJ SOLN
INTRAMUSCULAR | Status: AC
  Filled 2017-09-25: qty 1

## 2017-09-25 MED ORDER — SODIUM CHLORIDE 0.9 % IV SOLN
180.0000 mg/m2 | Freq: Once | INTRAVENOUS | Status: AC
  Administered 2017-09-25: 340 mg via INTRAVENOUS
  Filled 2017-09-25: qty 17

## 2017-09-25 MED ORDER — SODIUM CHLORIDE 0.9 % IV SOLN
Freq: Once | INTRAVENOUS | Status: AC
  Administered 2017-09-25: 10:00:00 via INTRAVENOUS

## 2017-09-25 MED ORDER — PALONOSETRON HCL INJECTION 0.25 MG/5ML
0.2500 mg | Freq: Once | INTRAVENOUS | Status: AC
  Administered 2017-09-25: 0.25 mg via INTRAVENOUS

## 2017-09-25 MED ORDER — ATROPINE SULFATE 1 MG/ML IJ SOLN
0.5000 mg | Freq: Once | INTRAMUSCULAR | Status: AC | PRN
  Administered 2017-09-25: 0.5 mg via INTRAVENOUS

## 2017-09-25 NOTE — Telephone Encounter (Signed)
Scheduled appt per 1/2 los - Gave pt AVS and calender per los.

## 2017-09-25 NOTE — Progress Notes (Signed)
[facility] OFFICE PROGRESS NOTE   Diagnosis: Gastric cancer  INTERVAL HISTORY:   Robert Beck returns as scheduled.  He completed another cycle of FOLFIRI 09/04/2017.  He continues to have intermittent nausea.  He had some vomiting following the chemotherapy.  He tried Reglan but not consistently.  He notes improvement with Compazine and Zofran.  He had some diarrhea.  No mouth sores.  Hoarseness continues to be improved.  Objective:  Vital signs in last 24 hours:  Blood pressure 126/85, pulse 99, temperature (!) 97.5 F (36.4 C), temperature source Oral, resp. rate 17, height '5\' 6"'  (1.676 m), weight 167 lb 3.2 oz (75.8 kg), SpO2 99 %.    HEENT: No thrush or ulcers. Resp: Lungs clear bilaterally. Cardio: Regular rate and rhythm. GI: Abdomen soft and nontender.  No hepatomegaly. Vascular: No leg edema.  Skin: Palms without erythema. Port-A-Cath without erythema.   Lab Results:  Lab Results  Component Value Date   WBC 6.4 09/25/2017   HGB 13.6 09/25/2017   HCT 39.2 09/25/2017   MCV 85.8 09/25/2017   PLT 239 09/25/2017   NEUTROABS 3.5 09/25/2017    Imaging:  No results found.  Medications: I have reviewed the patient's current medications.  Assessment/Plan: 1. 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 Decreased gastric cardia wall thickening.  Cycle 7 FOLFOX 07/25/2016  Cycle 8 FOLFOX 08/08/2016  Cycle 9 FOLFOX 08/22/2016 (oxaliplatin held due to neuropathy)  Cycle 10 FOLFOX 09/05/2016 (oxaliplatin held)  Cycle 11 FOLFOX 09/26/2016 (oxaliplatin held)  Cycle 12 FOLFOX 10/10/2016 (oxaliplatin held)  CT 10/18/2016-mild enlargement of mediastinal lymph nodes, no other evidence of disease progression, stable thickening at the GE junction and stable gastrohepatic lymphadenopathy  Cycle 13 FOLFOX 10/24/2016 (oxaliplatin held)  Maintenance Xeloda 7 days on/7 days off beginning 11/17/2016  Restaging chest CT 01/22/2017-interval progression of metastatic lymphadenopathy in the mediastinum and gastrohepatic ligament.  Salvage therapy with FOLFIRI initiated 02/20/2017  Cycle 5 FOLFIRI 04/17/2017  Restaging chest CT 04/29/2017-mild improvement in mediastinal adenopathy. No significant change in adenopathy in the gastrohepatic ligament. No evidence of disease progression.  Cycle 6 FOLFIRI 05/01/2017  Cycle 7 FOLFIRI 05/15/2017  Cycle 8 FOLFIRI 05/29/2017  Cycle 9 FOLFIRI 06/19/2017  Cycle 10 FOLFIRI 07/03/2017  Cycle 11 FOLFIRI 07/17/2017  CT chest 07/30/2017-mild decrease in mediastinal and gastrohepatic ligament lymphadenopathy, no evidence of progressive disease  Cycle 12 FOLFIRI 07/31/2017  Cycle 13 FOLFIRI 08/14/2017  Cycle 14 FOLFIRI 09/04/2017  Cycle 15 FOLFIRI 09/25/2017  2. Anemia secondary to #1-improved  3. Diabetes  4. Gout  5. Hypertension  6. Hyperlipidemia  7. History of a T7 compression fracture  8. Pain secondary to the gastric mass and mediastinal lymphadenopathy-resolved  9. Port-A-Cath placement 04/20/2016  10. 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 1 mm obstructing  stone distal right ureter/right ureteral vesicle junction on CT 09/13/2016.  13. Hoarseness 12/19/2016-potentially related  to mediastinal adenopathy-left vocal cord paralysis confirmed on ENT exam 12/27/2016-improved  14. Hypercalcemia 12/19/2016-asymptomatic  15. Nausea following FOLFIRI-emend added beginning with cycle 3, trial of Reglan beginning12/08/2017    Disposition: Robert Beck appears stable.  He has completed 14 cycles of FOLFIRI.  Plan to proceed with cycle 15 today as scheduled.    He continues to have intermittent nausea.  He takes Compazine and Zofran as needed with good relief.  He will try Ativan as needed for the nausea as well.  He understands Ativan can cause sedation.  He will return for a follow-up visit and cycle 16 FOLFIRI in 3 weeks.  He will contact the office in the interim with any problems.  Ned Card ANP/GNP-BC   09/25/2017  10:00 AM

## 2017-09-27 ENCOUNTER — Ambulatory Visit (HOSPITAL_BASED_OUTPATIENT_CLINIC_OR_DEPARTMENT_OTHER): Payer: BLUE CROSS/BLUE SHIELD

## 2017-09-27 VITALS — BP 126/84 | HR 88 | Temp 98.2°F | Resp 17

## 2017-09-27 DIAGNOSIS — C160 Malignant neoplasm of cardia: Secondary | ICD-10-CM | POA: Diagnosis not present

## 2017-09-27 MED ORDER — HEPARIN SOD (PORK) LOCK FLUSH 100 UNIT/ML IV SOLN
500.0000 [IU] | Freq: Once | INTRAVENOUS | Status: AC | PRN
  Administered 2017-09-27: 500 [IU]
  Filled 2017-09-27: qty 5

## 2017-09-27 MED ORDER — SODIUM CHLORIDE 0.9% FLUSH
10.0000 mL | INTRAVENOUS | Status: DC | PRN
  Administered 2017-09-27: 10 mL
  Filled 2017-09-27: qty 10

## 2017-10-13 ENCOUNTER — Other Ambulatory Visit: Payer: Self-pay | Admitting: Oncology

## 2017-10-15 ENCOUNTER — Other Ambulatory Visit: Payer: Self-pay | Admitting: *Deleted

## 2017-10-15 DIAGNOSIS — C160 Malignant neoplasm of cardia: Secondary | ICD-10-CM

## 2017-10-16 ENCOUNTER — Telehealth: Payer: Self-pay | Admitting: Oncology

## 2017-10-16 ENCOUNTER — Inpatient Hospital Stay: Payer: BLUE CROSS/BLUE SHIELD

## 2017-10-16 ENCOUNTER — Inpatient Hospital Stay (HOSPITAL_BASED_OUTPATIENT_CLINIC_OR_DEPARTMENT_OTHER): Payer: BLUE CROSS/BLUE SHIELD | Admitting: Oncology

## 2017-10-16 ENCOUNTER — Inpatient Hospital Stay: Payer: BLUE CROSS/BLUE SHIELD | Attending: Oncology

## 2017-10-16 VITALS — BP 108/76 | HR 102 | Temp 97.8°F | Resp 18 | Ht 66.0 in | Wt 164.6 lb

## 2017-10-16 DIAGNOSIS — C778 Secondary and unspecified malignant neoplasm of lymph nodes of multiple regions: Secondary | ICD-10-CM | POA: Insufficient documentation

## 2017-10-16 DIAGNOSIS — C160 Malignant neoplasm of cardia: Secondary | ICD-10-CM

## 2017-10-16 DIAGNOSIS — Z95828 Presence of other vascular implants and grafts: Secondary | ICD-10-CM

## 2017-10-16 DIAGNOSIS — Z5111 Encounter for antineoplastic chemotherapy: Secondary | ICD-10-CM | POA: Diagnosis present

## 2017-10-16 LAB — CMP (CANCER CENTER ONLY)
ALT: 31 U/L (ref 0–55)
AST: 20 U/L (ref 5–34)
Albumin: 4.1 g/dL (ref 3.5–5.0)
Alkaline Phosphatase: 109 U/L (ref 40–150)
Anion gap: 11 (ref 3–11)
BUN: 11 mg/dL (ref 7–26)
CHLORIDE: 104 mmol/L (ref 98–109)
CO2: 23 mmol/L (ref 22–29)
Calcium: 9.8 mg/dL (ref 8.4–10.4)
Creatinine: 0.98 mg/dL (ref 0.70–1.30)
GFR, Est AFR Am: >60 mL/min (ref >60)
GFR, Estimated: >60 mL/min (ref >60)
GLUCOSE: 374 mg/dL — AB (ref 70–140)
POTASSIUM: 4.2 mmol/L (ref 3.5–5.1)
Sodium: 138 mmol/L (ref 136–145)
Total Bilirubin: 0.7 mg/dL (ref 0.2–1.2)
Total Protein: 7.3 g/dL (ref 6.4–8.3)

## 2017-10-16 LAB — CBC WITH DIFFERENTIAL (CANCER CENTER ONLY)
BASOS ABS: 0.1 10*3/uL (ref 0.0–0.1)
Basophils Relative: 1 %
EOS PCT: 4 %
Eosinophils Absolute: 0.3 10*3/uL (ref 0.0–0.5)
HEMATOCRIT: 41.2 % (ref 38.4–49.9)
Hemoglobin: 14 g/dL (ref 13.0–17.1)
LYMPHS PCT: 22 %
Lymphs Abs: 1.3 10*3/uL (ref 0.9–3.3)
MCH: 28.9 pg (ref 27.2–33.4)
MCHC: 33.9 g/dL (ref 32.0–36.0)
MCV: 85.4 fL (ref 79.3–98.0)
MONO ABS: 0.7 10*3/uL (ref 0.1–0.9)
Monocytes Relative: 11 %
NEUTROS ABS: 3.8 10*3/uL (ref 1.5–6.5)
Neutrophils Relative %: 62 %
PLATELETS: 254 10*3/uL (ref 140–400)
RBC: 4.83 MIL/uL (ref 4.20–5.82)
RDW: 15 % (ref 11.0–15.6)
WBC Count: 6.2 10*3/uL (ref 4.0–10.3)

## 2017-10-16 MED ORDER — PALONOSETRON HCL INJECTION 0.25 MG/5ML
INTRAVENOUS | Status: AC
  Filled 2017-10-16: qty 5

## 2017-10-16 MED ORDER — SODIUM CHLORIDE 0.9 % IV SOLN
Freq: Once | INTRAVENOUS | Status: AC
  Administered 2017-10-16: 10:00:00 via INTRAVENOUS

## 2017-10-16 MED ORDER — SODIUM CHLORIDE 0.9% FLUSH
10.0000 mL | INTRAVENOUS | Status: DC | PRN
  Filled 2017-10-16: qty 10

## 2017-10-16 MED ORDER — HEPARIN SOD (PORK) LOCK FLUSH 100 UNIT/ML IV SOLN
500.0000 [IU] | Freq: Once | INTRAVENOUS | Status: DC | PRN
  Filled 2017-10-16: qty 5

## 2017-10-16 MED ORDER — SODIUM CHLORIDE 0.9% FLUSH
10.0000 mL | INTRAVENOUS | Status: DC | PRN
  Administered 2017-10-16: 10 mL via INTRAVENOUS
  Filled 2017-10-16: qty 10

## 2017-10-16 MED ORDER — SODIUM CHLORIDE 0.9 % IV SOLN
Freq: Once | INTRAVENOUS | Status: AC
  Administered 2017-10-16: 11:00:00 via INTRAVENOUS
  Filled 2017-10-16: qty 5

## 2017-10-16 MED ORDER — ATROPINE SULFATE 1 MG/ML IJ SOLN
INTRAMUSCULAR | Status: AC
  Filled 2017-10-16: qty 1

## 2017-10-16 MED ORDER — SODIUM CHLORIDE 0.9 % IV SOLN
400.0000 mg/m2 | Freq: Once | INTRAVENOUS | Status: AC
  Administered 2017-10-16: 764 mg via INTRAVENOUS
  Filled 2017-10-16: qty 38.2

## 2017-10-16 MED ORDER — FLUOROURACIL CHEMO INJECTION 2.5 GM/50ML
400.0000 mg/m2 | Freq: Once | INTRAVENOUS | Status: AC
  Administered 2017-10-16: 750 mg via INTRAVENOUS
  Filled 2017-10-16: qty 15

## 2017-10-16 MED ORDER — PALONOSETRON HCL INJECTION 0.25 MG/5ML
0.2500 mg | Freq: Once | INTRAVENOUS | Status: AC
  Administered 2017-10-16: 0.25 mg via INTRAVENOUS

## 2017-10-16 MED ORDER — SODIUM CHLORIDE 0.9 % IV SOLN
180.0000 mg/m2 | Freq: Once | INTRAVENOUS | Status: AC
  Administered 2017-10-16: 340 mg via INTRAVENOUS
  Filled 2017-10-16: qty 17

## 2017-10-16 MED ORDER — FLUOROURACIL CHEMO INJECTION 5 GM/100ML
2400.0000 mg/m2 | INTRAVENOUS | Status: DC
  Administered 2017-10-16: 4600 mg via INTRAVENOUS
  Filled 2017-10-16: qty 92

## 2017-10-16 MED ORDER — ATROPINE SULFATE 1 MG/ML IJ SOLN
0.5000 mg | Freq: Once | INTRAMUSCULAR | Status: AC | PRN
  Administered 2017-10-16: 0.5 mg via INTRAVENOUS

## 2017-10-16 NOTE — Patient Instructions (Signed)
[facility] Discharge Instructions for Patients Receiving Chemotherapy  Today you received the following chemotherapy agents Irrinotecan, Leucovorin, 5FU.   To help prevent nausea and vomiting after your treatment, we encourage you to take your nausea medication as prescribed.    If you develop nausea and vomiting that is not controlled by your nausea medication, call the clinic.   BELOW ARE SYMPTOMS THAT SHOULD BE REPORTED IMMEDIATELY:  *FEVER GREATER THAN 100.5 F  *CHILLS WITH OR WITHOUT FEVER  NAUSEA AND VOMITING THAT IS NOT CONTROLLED WITH YOUR NAUSEA MEDICATION  *UNUSUAL SHORTNESS OF BREATH  *UNUSUAL BRUISING OR BLEEDING  TENDERNESS IN MOUTH AND THROAT WITH OR WITHOUT PRESENCE OF ULCERS  *URINARY PROBLEMS  *BOWEL PROBLEMS  UNUSUAL RASH Items with * indicate a potential emergency and should be followed up as soon as possible.  Feel free to call the clinic should you have any questions or concerns. The clinic phone number is [phone].  Please show the [place] at check-in to the Emergency Department and triage nurse.

## 2017-10-16 NOTE — Progress Notes (Signed)
[facility] OFFICE PROGRESS NOTE   Diagnosis: Gastric cancer  INTERVAL HISTORY:   Robert Beck returns as scheduled.  He completed another cycle of FOLFIRI 09/25/2017.  He reports increased nausea over the week following chemotherapy.  He has also noticed increased "restless legs ".  He had a metallic taste following the cycle of chemotherapy.  He reports mild diarrhea.  Good appetite.  Objective:  Vital signs in last 24 hours:  Blood pressure 108/76, pulse (!) 102, temperature 97.8 F (36.6 C), temperature source Oral, resp. rate 18, height '5\' 6"'  (1.676 m), weight 164 lb 9.6 oz (74.7 kg), SpO2 98 %.    HEENT: Mild white coat over the tongue, no buccal thrush.  No ulcers. Lymphatics: No cervical or supraclavicular nodes Resp: Lungs clear bilaterally Cardio: Regular rate and rhythm GI: No hepatomegaly, nontender Vascular: No leg edema   Portacath/PICC-without erythema  Lab Results:  Lab Results  Component Value Date   WBC 6.2 10/16/2017   HGB 13.6 09/25/2017   HCT 41.2 10/16/2017   MCV 85.4 10/16/2017   PLT 254 10/16/2017   NEUTROABS 3.8 10/16/2017    CMP     Component Value Date/Time   NA 138 10/16/2017 0816   NA 140 09/25/2017 0855   K 4.2 10/16/2017 0816   K 3.6 09/25/2017 0855   CL 104 10/16/2017 0816   CO2 23 10/16/2017 0816   CO2 22 09/25/2017 0855   GLUCOSE 374 (H) 10/16/2017 0816   GLUCOSE 211 (H) 09/25/2017 0855   BUN 11 10/16/2017 0816   BUN 10.1 09/25/2017 0855   CREATININE 0.9 09/25/2017 0855   CALCIUM 9.8 10/16/2017 0816   CALCIUM 9.8 09/25/2017 0855   PROT 7.3 10/16/2017 0816   PROT 7.2 09/25/2017 0855   ALBUMIN 4.1 10/16/2017 0816   ALBUMIN 4.0 09/25/2017 0855   AST 20 10/16/2017 0816   AST 22 09/25/2017 0855   ALT 31 10/16/2017 0816   ALT 33 09/25/2017 0855   ALKPHOS 109 10/16/2017 0816   ALKPHOS 118 09/25/2017 0855   BILITOT 0.7 10/16/2017 0816   BILITOT 0.39 09/25/2017 0855   GFRNONAA >60 10/16/2017 0816   GFRAA >60  10/16/2017 0816    Lab Results  Component Value Date   CEA1 3.83 06/27/2016     Medications: I have reviewed the patient's current medications.   Assessment/Plan: 1. 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 Decreased gastric cardia wall thickening.  Cycle 7 FOLFOX 07/25/2016  Cycle 8 FOLFOX 08/08/2016  Cycle 9 FOLFOX 08/22/2016 (oxaliplatin held due to neuropathy)  Cycle 10 FOLFOX 09/05/2016 (oxaliplatin held)  Cycle 11 FOLFOX 09/26/2016 (oxaliplatin held)  Cycle 12 FOLFOX 10/10/2016 (oxaliplatin held)  CT 10/18/2016-mild enlargement of mediastinal lymph nodes, no other evidence of disease progression, stable thickening at the GE junction and stable gastrohepatic lymphadenopathy  Cycle 13 FOLFOX 10/24/2016 (oxaliplatin held)  Maintenance Xeloda 7 days on/7 days off beginning 11/17/2016  Restaging chest CT 01/22/2017-interval progression of metastatic lymphadenopathy in  the mediastinum and gastrohepatic ligament.  Salvage therapy with FOLFIRI initiated 02/20/2017  Cycle 5 FOLFIRI 04/17/2017  Restaging chest CT 04/29/2017-mild improvement in mediastinal adenopathy. No significant change in adenopathy in the gastrohepatic ligament. No evidence of disease  progression.  Cycle 6 FOLFIRI 05/01/2017  Cycle 7 FOLFIRI 05/15/2017  Cycle 8 FOLFIRI 05/29/2017  Cycle 9 FOLFIRI 06/19/2017  Cycle 10 FOLFIRI 07/03/2017  Cycle 11 FOLFIRI 07/17/2017  CT chest 07/30/2017-mild decrease in mediastinal and gastrohepatic ligament lymphadenopathy, no evidence of progressive disease  Cycle 12 FOLFIRI 07/31/2017  Cycle 13 FOLFIRI 08/14/2017  Cycle 14 FOLFIRI 09/04/2017  Cycle 15 FOLFIRI 09/25/2017  Cycle 16 FOLFIRI 10/16/2017  2. Anemia secondary to #1-improved  3. Diabetes  4. Gout  5. Hypertension  6. Hyperlipidemia  7. History of a T7 compression fracture  8. Pain secondary to the gastric mass and mediastinal lymphadenopathy-resolved  9. Port-A-Cath placement 04/20/2016  10. 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 1 mm obstructing stone distal right ureter/right ureteral vesicle junction on CT 09/13/2016.  13. 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 Hypercalcemia 12/19/2016-asymptomatic  15. Nausea following FOLFIRI-emend added beginning with cycle 3, trial of Reglan beginning12/08/2017, persistent   Disposition: Robert Beck appears stable.  He will complete another cycle of FOLFIRI today.  He will return for an office visit and chemotherapy in 3 weeks.  He will continue Zofran, Ativan, and Compazine as needed for nausea.  He will follow-up with Dr. Virgina Jock for the restless leg syndrome.  We will plan for a restaging CT within the next 2-3 months.  15 minutes were spent with the patient today.  The majority of the time was used for counseling and coordination of care.  Betsy Coder, MD  10/16/2017  9:28 AM

## 2017-10-16 NOTE — Patient Instructions (Signed)
Implanted Port Home Guide An implanted port is a type of central line that is placed under the skin. Central lines are used to provide IV access when treatment or nutrition needs to be given through a person's veins. Implanted ports are used for long-term IV access. An implanted port may be placed because:  You need IV medicine that would be irritating to the small veins in your hands or arms.  You need long-term IV medicines, such as antibiotics.  You need IV nutrition for a long period.  You need frequent blood draws for lab tests.  You need dialysis.  Implanted ports are usually placed in the chest area, but they can also be placed in the upper arm, the abdomen, or the leg. An implanted port has two main parts:  Reservoir. The reservoir is round and will appear as a small, raised area under your skin. The reservoir is the part where a needle is inserted to give medicines or draw blood.  Catheter. The catheter is a thin, flexible tube that extends from the reservoir. The catheter is placed into a large vein. Medicine that is inserted into the reservoir goes into the catheter and then into the vein.  How will I care for my incision site? Do not get the incision site wet. Bathe or shower as directed by your health care provider. How is my port accessed? Special steps must be taken to access the port:  Before the port is accessed, a numbing cream can be placed on the skin. This helps numb the skin over the port site.  Your health care provider uses a sterile technique to access the port. ? Your health care provider must put on a mask and sterile gloves. ? The skin over your port is cleaned carefully with an antiseptic and allowed to dry. ? The port is gently pinched between sterile gloves, and a needle is inserted into the port.  Only "non-coring" port needles should be used to access the port. Once the port is accessed, a blood return should be checked. This helps ensure that the port  is in the vein and is not clogged.  If your port needs to remain accessed for a constant infusion, a clear (transparent) bandage will be placed over the needle site. The bandage and needle will need to be changed every week, or as directed by your health care provider.  Keep the bandage covering the needle clean and dry. Do not get it wet. Follow your health care provider's instructions on how to take a shower or bath while the port is accessed.  If your port does not need to stay accessed, no bandage is needed over the port.  What is flushing? Flushing helps keep the port from getting clogged. Follow your health care provider's instructions on how and when to flush the port. Ports are usually flushed with saline solution or a medicine called heparin. The need for flushing will depend on how the port is used.  If the port is used for intermittent medicines or blood draws, the port will need to be flushed: ? After medicines have been given. ? After blood has been drawn. ? As part of routine maintenance.  If a constant infusion is running, the port may not need to be flushed.  How long will my port stay implanted? The port can stay in for as long as your health care provider thinks it is needed. When it is time for the port to come out, surgery will be   done to remove it. The procedure is similar to the one performed when the port was put in. When should I seek immediate medical care? When you have an implanted port, you should seek immediate medical care if:  You notice a bad smell coming from the incision site.  You have swelling, redness, or drainage at the incision site.  You have more swelling or pain at the port site or the surrounding area.  You have a fever that is not controlled with medicine.  This information is not intended to replace advice given to you by your health care provider. Make sure you discuss any questions you have with your health care provider. Document  Released: 09/10/2005 Document Revised: 02/16/2016 Document Reviewed: 05/18/2013 Elsevier Interactive Patient Education  2017 Elsevier Inc.  

## 2017-10-16 NOTE — Telephone Encounter (Signed)
Gave relative avs report and appointments for January thru March

## 2017-10-18 ENCOUNTER — Inpatient Hospital Stay: Payer: BLUE CROSS/BLUE SHIELD

## 2017-10-18 VITALS — BP 128/64 | HR 88 | Temp 98.2°F | Resp 17

## 2017-10-18 DIAGNOSIS — C160 Malignant neoplasm of cardia: Secondary | ICD-10-CM

## 2017-10-18 DIAGNOSIS — Z5111 Encounter for antineoplastic chemotherapy: Secondary | ICD-10-CM | POA: Diagnosis not present

## 2017-10-18 MED ORDER — SODIUM CHLORIDE 0.9% FLUSH
10.0000 mL | INTRAVENOUS | Status: DC | PRN
  Administered 2017-10-18: 10 mL
  Filled 2017-10-18: qty 10

## 2017-10-18 MED ORDER — HEPARIN SOD (PORK) LOCK FLUSH 100 UNIT/ML IV SOLN
500.0000 [IU] | Freq: Once | INTRAVENOUS | Status: AC | PRN
  Administered 2017-10-18: 500 [IU]
  Filled 2017-10-18: qty 5

## 2017-10-28 ENCOUNTER — Other Ambulatory Visit: Payer: Self-pay | Admitting: Emergency Medicine

## 2017-10-28 ENCOUNTER — Other Ambulatory Visit: Payer: Self-pay | Admitting: *Deleted

## 2017-10-28 DIAGNOSIS — C160 Malignant neoplasm of cardia: Secondary | ICD-10-CM

## 2017-10-28 MED ORDER — POTASSIUM CHLORIDE CRYS ER 10 MEQ PO TBCR
10.0000 meq | EXTENDED_RELEASE_TABLET | Freq: Two times a day (BID) | ORAL | 1 refills | Status: DC
Start: 2017-10-28 — End: 2018-02-19

## 2017-11-03 ENCOUNTER — Other Ambulatory Visit: Payer: Self-pay | Admitting: Oncology

## 2017-11-06 ENCOUNTER — Encounter: Payer: Self-pay | Admitting: Nurse Practitioner

## 2017-11-06 ENCOUNTER — Inpatient Hospital Stay: Payer: BLUE CROSS/BLUE SHIELD | Attending: Oncology

## 2017-11-06 ENCOUNTER — Inpatient Hospital Stay (HOSPITAL_BASED_OUTPATIENT_CLINIC_OR_DEPARTMENT_OTHER): Payer: BLUE CROSS/BLUE SHIELD | Admitting: Nurse Practitioner

## 2017-11-06 ENCOUNTER — Inpatient Hospital Stay: Payer: BLUE CROSS/BLUE SHIELD

## 2017-11-06 ENCOUNTER — Telehealth: Payer: Self-pay | Admitting: Oncology

## 2017-11-06 VITALS — BP 146/94 | HR 81 | Temp 97.9°F | Resp 20 | Ht 66.0 in | Wt 166.7 lb

## 2017-11-06 DIAGNOSIS — Z5111 Encounter for antineoplastic chemotherapy: Secondary | ICD-10-CM | POA: Insufficient documentation

## 2017-11-06 DIAGNOSIS — I10 Essential (primary) hypertension: Secondary | ICD-10-CM | POA: Diagnosis not present

## 2017-11-06 DIAGNOSIS — C160 Malignant neoplasm of cardia: Secondary | ICD-10-CM

## 2017-11-06 DIAGNOSIS — C778 Secondary and unspecified malignant neoplasm of lymph nodes of multiple regions: Secondary | ICD-10-CM | POA: Insufficient documentation

## 2017-11-06 DIAGNOSIS — Z452 Encounter for adjustment and management of vascular access device: Secondary | ICD-10-CM | POA: Diagnosis not present

## 2017-11-06 DIAGNOSIS — T451X5A Adverse effect of antineoplastic and immunosuppressive drugs, initial encounter: Secondary | ICD-10-CM

## 2017-11-06 DIAGNOSIS — E119 Type 2 diabetes mellitus without complications: Secondary | ICD-10-CM

## 2017-11-06 DIAGNOSIS — G620 Drug-induced polyneuropathy: Secondary | ICD-10-CM | POA: Diagnosis not present

## 2017-11-06 DIAGNOSIS — Z95828 Presence of other vascular implants and grafts: Secondary | ICD-10-CM

## 2017-11-06 DIAGNOSIS — R110 Nausea: Secondary | ICD-10-CM

## 2017-11-06 LAB — CBC WITH DIFFERENTIAL/PLATELET
Basophils Absolute: 0 10*3/uL (ref 0.0–0.1)
Basophils Relative: 0 %
EOS ABS: 0.2 10*3/uL (ref 0.0–0.5)
Eosinophils Relative: 4 %
HCT: 35.9 % — ABNORMAL LOW (ref 38.4–49.9)
Hemoglobin: 12 g/dL — ABNORMAL LOW (ref 13.0–17.1)
LYMPHS ABS: 1.4 10*3/uL (ref 0.9–3.3)
LYMPHS PCT: 26 %
MCH: 28.7 pg (ref 27.2–33.4)
MCHC: 33.4 g/dL (ref 32.0–36.0)
MCV: 85.9 fL (ref 79.3–98.0)
MONOS PCT: 12 %
Monocytes Absolute: 0.6 10*3/uL (ref 0.1–0.9)
Neutro Abs: 2.9 10*3/uL (ref 1.5–6.5)
Neutrophils Relative %: 58 %
Platelets: 228 10*3/uL (ref 140–400)
RBC: 4.18 MIL/uL — AB (ref 4.20–5.82)
RDW: 13.9 % (ref 11.0–14.6)
WBC: 5.1 10*3/uL (ref 4.0–10.3)

## 2017-11-06 LAB — COMPREHENSIVE METABOLIC PANEL
ALBUMIN: 3.4 g/dL — AB (ref 3.5–5.0)
ALT: 17 U/L (ref 0–55)
AST: 14 U/L (ref 5–34)
Alkaline Phosphatase: 86 U/L (ref 40–150)
Anion gap: 10 (ref 3–11)
BUN: 6 mg/dL — AB (ref 7–26)
CHLORIDE: 108 mmol/L (ref 98–109)
CO2: 23 mmol/L (ref 22–29)
Calcium: 9.2 mg/dL (ref 8.4–10.4)
Creatinine, Ser: 0.79 mg/dL (ref 0.70–1.30)
GFR calc Af Amer: >60 mL/min (ref >60)
GFR calc non Af Amer: >60 mL/min (ref >60)
GLUCOSE: 173 mg/dL — AB (ref 70–140)
POTASSIUM: 3.8 mmol/L (ref 3.5–5.1)
SODIUM: 141 mmol/L (ref 136–145)
Total Bilirubin: 0.4 mg/dL (ref 0.2–1.2)
Total Protein: 6.4 g/dL (ref 6.4–8.3)

## 2017-11-06 MED ORDER — PALONOSETRON HCL INJECTION 0.25 MG/5ML
INTRAVENOUS | Status: AC
  Filled 2017-11-06: qty 5

## 2017-11-06 MED ORDER — SODIUM CHLORIDE 0.9 % IV SOLN
Freq: Once | INTRAVENOUS | Status: AC
  Administered 2017-11-06: 10:00:00 via INTRAVENOUS

## 2017-11-06 MED ORDER — IRINOTECAN HCL CHEMO INJECTION 100 MG/5ML
180.0000 mg/m2 | Freq: Once | INTRAVENOUS | Status: AC
  Administered 2017-11-06: 340 mg via INTRAVENOUS
  Filled 2017-11-06: qty 17

## 2017-11-06 MED ORDER — ATROPINE SULFATE 1 MG/ML IJ SOLN
INTRAMUSCULAR | Status: AC
  Filled 2017-11-06: qty 1

## 2017-11-06 MED ORDER — SODIUM CHLORIDE 0.9% FLUSH
10.0000 mL | INTRAVENOUS | Status: DC | PRN
  Administered 2017-11-06: 10 mL via INTRAVENOUS
  Filled 2017-11-06: qty 10

## 2017-11-06 MED ORDER — PROCHLORPERAZINE MALEATE 10 MG PO TABS
10.0000 mg | ORAL_TABLET | Freq: Four times a day (QID) | ORAL | Status: DC | PRN
  Administered 2017-11-06: 10 mg via ORAL

## 2017-11-06 MED ORDER — PALONOSETRON HCL INJECTION 0.25 MG/5ML
0.2500 mg | Freq: Once | INTRAVENOUS | Status: AC
  Administered 2017-11-06: 0.25 mg via INTRAVENOUS

## 2017-11-06 MED ORDER — SODIUM CHLORIDE 0.9 % IV SOLN
2400.0000 mg/m2 | INTRAVENOUS | Status: DC
  Administered 2017-11-06: 4600 mg via INTRAVENOUS
  Filled 2017-11-06: qty 92

## 2017-11-06 MED ORDER — ATROPINE SULFATE 1 MG/ML IJ SOLN
0.5000 mg | Freq: Once | INTRAMUSCULAR | Status: AC | PRN
  Administered 2017-11-06: 0.5 mg via INTRAVENOUS

## 2017-11-06 MED ORDER — FLUOROURACIL CHEMO INJECTION 2.5 GM/50ML
400.0000 mg/m2 | Freq: Once | INTRAVENOUS | Status: AC
  Administered 2017-11-06: 750 mg via INTRAVENOUS
  Filled 2017-11-06: qty 15

## 2017-11-06 MED ORDER — SODIUM CHLORIDE 0.9 % IV SOLN
Freq: Once | INTRAVENOUS | Status: AC
  Administered 2017-11-06: 10:00:00 via INTRAVENOUS
  Filled 2017-11-06: qty 5

## 2017-11-06 MED ORDER — SODIUM CHLORIDE 0.9 % IV SOLN
400.0000 mg/m2 | Freq: Once | INTRAVENOUS | Status: AC
  Administered 2017-11-06: 764 mg via INTRAVENOUS
  Filled 2017-11-06: qty 38.2

## 2017-11-06 MED ORDER — PROCHLORPERAZINE MALEATE 10 MG PO TABS
ORAL_TABLET | ORAL | Status: AC
  Filled 2017-11-06: qty 1

## 2017-11-06 NOTE — Telephone Encounter (Signed)
Scheduled appt per 2/13 los - Patient is aware of appt date and time. No print out wanted .

## 2017-11-06 NOTE — Progress Notes (Signed)
[facility] OFFICE PROGRESS NOTE   Diagnosis: Gastric cancer  INTERVAL HISTORY:   [name] returns as scheduled.  He completed another cycle of FOLFIRI 10/16/2017.  He again had nausea beginning after pump removal.  He took Zofran, Compazine and Ativan with partial relief.  He tried Reglan in the past but did not note any benefit.  He noted an increase in restless legs during this time as well.  He plans to increase his dose of Requip.  No mouth sores.  No diarrhea.  Hoarseness continues to be improved.  No pain.  No bleeding.  Good appetite overall.  Objective:  Vital signs in last 24 hours:  Blood pressure (!) 146/94, pulse 81, temperature 97.9 F (36.6 C), temperature source Oral, resp. rate 20, height 5' 6" (1.676 m), weight 166 lb 11.2 oz (75.6 kg), SpO2 99 %.    HEENT: No thrush or ulcers. Lymphatics: No palpable cervical or supraclavicular lymph nodes. Resp: Lungs clear bilaterally. Cardio: Regular rate and rhythm. GI: Abdomen soft and nontender.  No hepatomegaly. Vascular: No leg edema.  Calves soft and nontender. Port-A-Cath without erythema.  Lab Results:  Lab Results  Component Value Date   WBC 5.1 11/06/2017   HGB 12.0 (L) 11/06/2017   HCT 35.9 (L) 11/06/2017   MCV 85.9 11/06/2017   PLT 228 11/06/2017   NEUTROABS 2.9 11/06/2017    Imaging:  No results found.  Medications: I have reviewed the patient's current medications.  Assessment/Plan: 1. Gastric cancer, gastric cardia mass extendingto the GE junction confirmed on endoscopy 04/11/2016, biopsy confirmed adenocarcinoma  Staging CTs of the chest, abdomen, and pelvis 04/09/2016-gastric cardia mass, mediastinal, right hilar, and right supraclavicular lymphadenopathy  Biopsy of a right supraclavicular lymph node on 04/20/2016 revealed metastatic adenocarcinoma  FoundationACT 02/20/2017-TP53 alteration identified  Cycle 1 FOLFOX 04/30/2016 (oxaliplatin given 04/30/2016, 5-FU infusion  started 05/02/2016)  Cycle 2 FOLFOX 05/14/2016  Cycle 3 FOLFOX 05/30/2016  Cycle 4 FOLFOX 06/13/2016  Cycle 5 FOLFOX 06/27/2016  Cycle 6 FOLFOX 07/11/2016  CTs 07/20/2016-decrease in supraclavicular, mediastinal, and gastrohepatic adenopathy. Decreased gastric cardia wall thickening.  Cycle 7 FOLFOX 07/25/2016  Cycle 8 FOLFOX 08/08/2016  Cycle 9 FOLFOX 08/22/2016 (oxaliplatin held due to neuropathy)  Cycle 10 FOLFOX 09/05/2016 (oxaliplatin held)  Cycle 11 FOLFOX 09/26/2016 (oxaliplatin held)  Cycle 12 FOLFOX 10/10/2016 (oxaliplatin held)  CT 10/18/2016-mild enlargement of mediastinal lymph nodes, no other evidence of disease progression, stable thickening at the GE junction and stable gastrohepatic lymphadenopathy  Cycle 13 FOLFOX 10/24/2016 (oxaliplatin held)  Maintenance Xeloda 7 days on/7 days off beginning 11/17/2016  Restaging chest CT 01/22/2017-interval progression of metastatic lymphadenopathy in the mediastinum and gastrohepatic ligament.  Salvage therapy with FOLFIRI initiated 02/20/2017  Cycle 5 FOLFIRI 04/17/2017  Restaging chest CT 04/29/2017-mild improvement in mediastinal adenopathy. No significant change in adenopathy in the gastrohepatic ligament. No evidence of disease progression.  Cycle 6 FOLFIRI 05/01/2017  Cycle 7 FOLFIRI 05/15/2017  Cycle 8 FOLFIRI 05/29/2017  Cycle 9 FOLFIRI 06/19/2017  Cycle 10 FOLFIRI 07/03/2017  Cycle 11 FOLFIRI 07/17/2017  CT chest 07/30/2017-mild decrease in mediastinal and gastrohepatic ligament lymphadenopathy, no evidence of progressive disease  Cycle 12 FOLFIRI 07/31/2017  Cycle 13 FOLFIRI 08/14/2017  Cycle 14 FOLFIRI 09/04/2017  Cycle 15 FOLFIRI 09/25/2017  Cycle 16 FOLFIRI 10/16/2017  Cycle 17 FOLFIRI 11/06/2017  2. Anemia secondary to #1-improved  3. Diabetes  4. Gout  5. Hypertension  6. Hyperlipidemia  7. History of a T7 compression fracture  8. Pain secondary to  the  gastric mass and mediastinal lymphadenopathy-resolved  9. Port-A-Cath placement 04/20/2016  10. 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 1 mm obstructing stone distal right ureter/right ureteral vesicle junction on CT 09/13/2016.  13. 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 Hypercalcemia 12/19/2016-asymptomatic  15. Nausea following FOLFIRI-emend added beginning with cycle 3, trial of Reglan beginning12/08/2017, persistent    Disposition: [name].  He has completed 16 cycles of FOLFIRI.  There is no clinical evidence of disease progression.  Plan to proceed with cycle 17 today as scheduled.  He continues to have delayed nausea following the chemotherapy.  He will continue Zofran and Ativan.  He will try to limit Compazine as this may be increasing the restless leg symptoms.  We discussed steroids beginning on the day of pump discontinuation.  He understands this would likely increase his blood sugar further and we would need assistance from Dr. Russo with a sliding scale.  At this time he does not want to add steroids.  He will return for a follow-up visit in the next cycle of FOLFIRI in 3 weeks.  He will contact the office in the interim with any problems.    Lisa Thomas ANP/GNP-BC   11/06/2017  9:05 AM        

## 2017-11-06 NOTE — Patient Instructions (Addendum)
[facility] Discharge Instructions for Patients Receiving Chemotherapy  Today you received the following chemotherapy agents Irrinotecan, Leucovorin, 5FU.   To help prevent nausea and vomiting after your treatment, we encourage you to take your nausea medication as prescribed.    If you develop nausea and vomiting that is not controlled by your nausea medication, call the clinic.   BELOW ARE SYMPTOMS THAT SHOULD BE REPORTED IMMEDIATELY:  *FEVER GREATER THAN 100.5 F  *CHILLS WITH OR WITHOUT FEVER  NAUSEA AND VOMITING THAT IS NOT CONTROLLED WITH YOUR NAUSEA MEDICATION  *UNUSUAL SHORTNESS OF BREATH  *UNUSUAL BRUISING OR BLEEDING  TENDERNESS IN MOUTH AND THROAT WITH OR WITHOUT PRESENCE OF ULCERS  *URINARY PROBLEMS  *BOWEL PROBLEMS  UNUSUAL RASH Items with * indicate a potential emergency and should be followed up as soon as possible.  Feel free to call the clinic should you have any questions or concerns. The clinic phone number is [phone].  Please show the [place] at check-in to the Emergency Department and triage nurse.

## 2017-11-08 ENCOUNTER — Inpatient Hospital Stay: Payer: BLUE CROSS/BLUE SHIELD

## 2017-11-08 VITALS — BP 146/78 | HR 78 | Temp 98.9°F | Resp 18

## 2017-11-08 DIAGNOSIS — Z95828 Presence of other vascular implants and grafts: Secondary | ICD-10-CM

## 2017-11-08 DIAGNOSIS — Z5111 Encounter for antineoplastic chemotherapy: Secondary | ICD-10-CM | POA: Diagnosis not present

## 2017-11-08 DIAGNOSIS — C160 Malignant neoplasm of cardia: Secondary | ICD-10-CM

## 2017-11-08 MED ORDER — SODIUM CHLORIDE 0.9% FLUSH
10.0000 mL | INTRAVENOUS | Status: DC | PRN
  Administered 2017-11-08: 10 mL via INTRAVENOUS
  Filled 2017-11-08: qty 10

## 2017-11-08 MED ORDER — HEPARIN SOD (PORK) LOCK FLUSH 100 UNIT/ML IV SOLN
500.0000 [IU] | Freq: Once | INTRAVENOUS | Status: AC | PRN
  Administered 2017-11-08: 500 [IU] via INTRAVENOUS
  Filled 2017-11-08: qty 5

## 2017-11-23 ENCOUNTER — Other Ambulatory Visit: Payer: Self-pay | Admitting: Oncology

## 2017-11-27 ENCOUNTER — Inpatient Hospital Stay: Payer: BLUE CROSS/BLUE SHIELD

## 2017-11-27 ENCOUNTER — Telehealth: Payer: Self-pay | Admitting: Oncology

## 2017-11-27 ENCOUNTER — Inpatient Hospital Stay: Payer: BLUE CROSS/BLUE SHIELD | Attending: Oncology | Admitting: Oncology

## 2017-11-27 VITALS — BP 146/95 | HR 100 | Temp 97.7°F | Resp 18 | Ht 66.0 in | Wt 163.7 lb

## 2017-11-27 DIAGNOSIS — R490 Dysphonia: Secondary | ICD-10-CM | POA: Diagnosis not present

## 2017-11-27 DIAGNOSIS — Z95828 Presence of other vascular implants and grafts: Secondary | ICD-10-CM

## 2017-11-27 DIAGNOSIS — G620 Drug-induced polyneuropathy: Secondary | ICD-10-CM | POA: Diagnosis not present

## 2017-11-27 DIAGNOSIS — M109 Gout, unspecified: Secondary | ICD-10-CM | POA: Diagnosis not present

## 2017-11-27 DIAGNOSIS — D630 Anemia in neoplastic disease: Secondary | ICD-10-CM | POA: Insufficient documentation

## 2017-11-27 DIAGNOSIS — R197 Diarrhea, unspecified: Secondary | ICD-10-CM | POA: Diagnosis not present

## 2017-11-27 DIAGNOSIS — I10 Essential (primary) hypertension: Secondary | ICD-10-CM | POA: Insufficient documentation

## 2017-11-27 DIAGNOSIS — C778 Secondary and unspecified malignant neoplasm of lymph nodes of multiple regions: Secondary | ICD-10-CM | POA: Diagnosis not present

## 2017-11-27 DIAGNOSIS — R110 Nausea: Secondary | ICD-10-CM | POA: Diagnosis not present

## 2017-11-27 DIAGNOSIS — Z5111 Encounter for antineoplastic chemotherapy: Secondary | ICD-10-CM | POA: Insufficient documentation

## 2017-11-27 DIAGNOSIS — E119 Type 2 diabetes mellitus without complications: Secondary | ICD-10-CM | POA: Diagnosis not present

## 2017-11-27 DIAGNOSIS — C160 Malignant neoplasm of cardia: Secondary | ICD-10-CM

## 2017-11-27 DIAGNOSIS — E785 Hyperlipidemia, unspecified: Secondary | ICD-10-CM | POA: Insufficient documentation

## 2017-11-27 DIAGNOSIS — T451X5A Adverse effect of antineoplastic and immunosuppressive drugs, initial encounter: Secondary | ICD-10-CM

## 2017-11-27 LAB — CBC WITH DIFFERENTIAL (CANCER CENTER ONLY)
Basophils Absolute: 0.1 10*3/uL (ref 0.0–0.1)
Basophils Relative: 1 %
Eosinophils Absolute: 0.2 10*3/uL (ref 0.0–0.5)
Eosinophils Relative: 3 %
HEMATOCRIT: 38.9 % (ref 38.4–49.9)
HEMOGLOBIN: 12.9 g/dL — AB (ref 13.0–17.1)
LYMPHS ABS: 1.4 10*3/uL (ref 0.9–3.3)
LYMPHS PCT: 19 %
MCH: 27.6 pg (ref 27.2–33.4)
MCHC: 33.2 g/dL (ref 32.0–36.0)
MCV: 83.2 fL (ref 79.3–98.0)
Monocytes Absolute: 0.7 10*3/uL (ref 0.1–0.9)
Monocytes Relative: 9 %
NEUTROS ABS: 5.1 10*3/uL (ref 1.5–6.5)
Neutrophils Relative %: 68 %
Platelet Count: 289 10*3/uL (ref 140–400)
RBC: 4.67 MIL/uL (ref 4.20–5.82)
RDW: 15.5 % — ABNORMAL HIGH (ref 11.0–14.6)
WBC: 7.5 10*3/uL (ref 4.0–10.3)

## 2017-11-27 LAB — CMP (CANCER CENTER ONLY)
ALT: 19 U/L (ref 0–55)
AST: 16 U/L (ref 5–34)
Albumin: 3.8 g/dL (ref 3.5–5.0)
Alkaline Phosphatase: 108 U/L (ref 40–150)
Anion gap: 11 (ref 3–11)
BUN: 11 mg/dL (ref 7–26)
CHLORIDE: 106 mmol/L (ref 98–109)
CO2: 24 mmol/L (ref 22–29)
CREATININE: 0.84 mg/dL (ref 0.70–1.30)
Calcium: 10.5 mg/dL — ABNORMAL HIGH (ref 8.4–10.4)
GFR, Estimated: >60 mL/min (ref >60)
Glucose, Bld: 208 mg/dL — ABNORMAL HIGH (ref 70–140)
POTASSIUM: 4 mmol/L (ref 3.5–5.1)
SODIUM: 141 mmol/L (ref 136–145)
Total Bilirubin: 0.4 mg/dL (ref 0.2–1.2)
Total Protein: 7.4 g/dL (ref 6.4–8.3)

## 2017-11-27 MED ORDER — SODIUM CHLORIDE 0.9 % IV SOLN
400.0000 mg/m2 | Freq: Once | INTRAVENOUS | Status: AC
  Administered 2017-11-27: 764 mg via INTRAVENOUS
  Filled 2017-11-27: qty 38.2

## 2017-11-27 MED ORDER — FLUOROURACIL CHEMO INJECTION 2.5 GM/50ML
400.0000 mg/m2 | Freq: Once | INTRAVENOUS | Status: AC
  Administered 2017-11-27: 750 mg via INTRAVENOUS
  Filled 2017-11-27: qty 15

## 2017-11-27 MED ORDER — SODIUM CHLORIDE 0.9 % IV SOLN
Freq: Once | INTRAVENOUS | Status: AC
  Administered 2017-11-27: 10:00:00 via INTRAVENOUS

## 2017-11-27 MED ORDER — SODIUM CHLORIDE 0.9% FLUSH
10.0000 mL | INTRAVENOUS | Status: DC | PRN
  Administered 2017-11-27: 10 mL via INTRAVENOUS
  Filled 2017-11-27: qty 10

## 2017-11-27 MED ORDER — SODIUM CHLORIDE 0.9 % IV SOLN
Freq: Once | INTRAVENOUS | Status: AC
  Administered 2017-11-27: 11:00:00 via INTRAVENOUS
  Filled 2017-11-27: qty 5

## 2017-11-27 MED ORDER — ATROPINE SULFATE 1 MG/ML IJ SOLN
0.5000 mg | Freq: Once | INTRAMUSCULAR | Status: AC | PRN
  Administered 2017-11-27: 0.5 mg via INTRAVENOUS

## 2017-11-27 MED ORDER — PALONOSETRON HCL INJECTION 0.25 MG/5ML
INTRAVENOUS | Status: AC
  Filled 2017-11-27: qty 5

## 2017-11-27 MED ORDER — PALONOSETRON HCL INJECTION 0.25 MG/5ML
0.2500 mg | Freq: Once | INTRAVENOUS | Status: AC
  Administered 2017-11-27: 0.25 mg via INTRAVENOUS

## 2017-11-27 MED ORDER — IRINOTECAN HCL CHEMO INJECTION 100 MG/5ML
180.0000 mg/m2 | Freq: Once | INTRAVENOUS | Status: AC
  Administered 2017-11-27: 340 mg via INTRAVENOUS
  Filled 2017-11-27: qty 17

## 2017-11-27 MED ORDER — ATROPINE SULFATE 1 MG/ML IJ SOLN
INTRAMUSCULAR | Status: AC
  Filled 2017-11-27: qty 1

## 2017-11-27 MED ORDER — SODIUM CHLORIDE 0.9 % IV SOLN
2400.0000 mg/m2 | INTRAVENOUS | Status: DC
  Administered 2017-11-27: 4600 mg via INTRAVENOUS
  Filled 2017-11-27: qty 92

## 2017-11-27 NOTE — Patient Instructions (Signed)
[facility] Discharge Instructions for Patients Receiving Chemotherapy  Today you received the following chemotherapy agents Irrinotecan, Leucovorin, 5FU.   To help prevent nausea and vomiting after your treatment, we encourage you to take your nausea medication as prescribed.    If you develop nausea and vomiting that is not controlled by your nausea medication, call the clinic.   BELOW ARE SYMPTOMS THAT SHOULD BE REPORTED IMMEDIATELY:  *FEVER GREATER THAN 100.5 F  *CHILLS WITH OR WITHOUT FEVER  NAUSEA AND VOMITING THAT IS NOT CONTROLLED WITH YOUR NAUSEA MEDICATION  *UNUSUAL SHORTNESS OF BREATH  *UNUSUAL BRUISING OR BLEEDING  TENDERNESS IN MOUTH AND THROAT WITH OR WITHOUT PRESENCE OF ULCERS  *URINARY PROBLEMS  *BOWEL PROBLEMS  UNUSUAL RASH Items with * indicate a potential emergency and should be followed up as soon as possible.  Feel free to call the clinic should you have any questions or concerns. The clinic phone number is [phone].  Please show the [place] at check-in to the Emergency Department and triage nurse.

## 2017-11-27 NOTE — Progress Notes (Signed)
[facility] OFFICE PROGRESS NOTE   Diagnosis: Gastric cancer  INTERVAL HISTORY:   [name] returns as scheduled.  He completed another treatment with FOLFIRI on 11/06/2017.  He continues to have nausea following chemotherapy.  He uses Zofran as needed.  He has intermittent diarrhea, improved with Imodium. He reports improvement in restless legs when he discontinued Compazine. He saw Dr. Virgina Jock and reports the insulin dose was increased when the A1c returned elevated.  Objective:  Vital signs in last 24 hours:  Blood pressure (!) 146/95, pulse 100, temperature 97.7 F (36.5 C), temperature source Oral, resp. rate 18, height _0  (1.676 m), weight 163 lb 11.2 oz (74.3 kg), SpO2 100 %.    HEENT: No thrush or ulcers Lymphatics: No cervical or supraclavicular nodes Resp: Lungs clear bilaterally Cardio: Regular rate and rhythm GI: No hepatomegaly, nontender Vascular: No leg edema  Skin: Palms without erythema  Portacath/PICC-without erythema  Lab Results:  Lab Results  Component Value Date   WBC 7.5 11/27/2017   HGB 12.0 (L) 11/06/2017   HCT 38.9 11/27/2017   MCV 83.2 11/27/2017   PLT 289 11/27/2017   NEUTROABS 5.1 11/27/2017    CMP     Component Value Date/Time   NA 141 11/06/2017 0805   NA 140 09/25/2017 0855   K 3.8 11/06/2017 0805   K 3.6 09/25/2017 0855   CL 108 11/06/2017 0805   CO2 23 11/06/2017 0805   CO2 22 09/25/2017 0855   GLUCOSE 173 (H) 11/06/2017 0805   GLUCOSE 211 (H) 09/25/2017 0855   BUN 6 (L) 11/06/2017 0805   BUN 10.1 09/25/2017 0855   CREATININE 0.79 11/06/2017 0805   CREATININE 0.98 10/16/2017 0816   CREATININE 0.9 09/25/2017 0855   CALCIUM 9.2 11/06/2017 0805   CALCIUM 9.8 09/25/2017 0855   PROT 6.4 11/06/2017 0805   PROT 7.2 09/25/2017 0855   ALBUMIN 3.4 (L) 11/06/2017 0805   ALBUMIN 4.0 09/25/2017 0855   AST 14 11/06/2017 0805   AST 20 10/16/2017 0816   AST 22 09/25/2017 0855   ALT 17 11/06/2017 0805   ALT 31  10/16/2017 0816   ALT 33 09/25/2017 0855   ALKPHOS 86 11/06/2017 0805   ALKPHOS 118 09/25/2017 0855   BILITOT 0.4 11/06/2017 0805   BILITOT 0.7 10/16/2017 0816   BILITOT 0.39 09/25/2017 0855   GFRNONAA >60 11/06/2017 0805   GFRNONAA >60 10/16/2017 0816   GFRAA >60 11/06/2017 0805   GFRAA >60 10/16/2017 0816    Medications: I have reviewed the patient's current medications.   Assessment/Plan:  1. 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 Decreased gastric cardia wall thickening.  Cycle 7 FOLFOX 07/25/2016  Cycle 8 FOLFOX 08/08/2016  Cycle 9 FOLFOX 08/22/2016 (oxaliplatin held due to neuropathy)  Cycle 10 FOLFOX 09/05/2016 (oxaliplatin held)  Cycle 11 FOLFOX 09/26/2016 (oxaliplatin held)  Cycle 12 FOLFOX 10/10/2016 (oxaliplatin held)  CT 10/18/2016-mild enlargement of mediastinal lymph nodes, no other evidence of  disease progression, stable thickening at the GE junction and stable gastrohepatic lymphadenopathy  Cycle 13 FOLFOX 10/24/2016 (oxaliplatin held)  Maintenance Xeloda 7 days on/7 days off beginning 11/17/2016  Restaging chest CT 01/22/2017-interval progression of metastatic lymphadenopathy in the mediastinum and gastrohepatic ligament.  Salvage therapy with FOLFIRI  initiated 02/20/2017  Cycle 5 FOLFIRI 04/17/2017  Restaging chest CT 04/29/2017-mild improvement in mediastinal adenopathy. No significant change in adenopathy in the gastrohepatic ligament. No evidence of disease progression.  Cycle 6 FOLFIRI 05/01/2017  Cycle 7 FOLFIRI 05/15/2017  Cycle 8 FOLFIRI 05/29/2017  Cycle 9 FOLFIRI 06/19/2017  Cycle 10 FOLFIRI 07/03/2017  Cycle 11 FOLFIRI 07/17/2017  CT chest 07/30/2017-mild decrease in mediastinal and gastrohepatic ligament lymphadenopathy, no evidence of progressive disease  Cycle 12 FOLFIRI 07/31/2017  Cycle 13 FOLFIRI 08/14/2017  Cycle 14 FOLFIRI 09/04/2017  Cycle 15 FOLFIRI 09/25/2017  Cycle 16 FOLFIRI 10/16/2017  Cycle 17 FOLFIRI 11/06/2017  Cycle 18 FOLFIRI 11/27/2017  2. Anemia secondary to #1-improved  3. Diabetes  4. Gout  5. Hypertension  6. Hyperlipidemia  7. History of a T7 compression fracture  8. Pain secondary to the gastric mass and mediastinal lymphadenopathy-resolved  9. Port-A-Cath placement 04/20/2016  10. 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 1 mm obstructing stone distal right ureter/right ureteral vesicle junction on CT 09/13/2016.  13. 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 Hypercalcemia 12/19/2016-asymptomatic  15. Nausea following FOLFIRI-emend added beginning with cycle 3, trial of Reglan beginning12/08/2017, persistent   Disposition: [name] appears stable.  The plan is to continue FOLFIRI.  Hoarseness remains improved.  We will plan for a restaging CT at an approximate 47-monthinterval.  He will return for an office visit and chemotherapy in 3 weeks.  15 minutes were spent with the patient today.  The majority of the time was used for counseling and  coordination of care.  GBetsy Coder MD  11/27/2017  9:57 AM

## 2017-11-27 NOTE — Telephone Encounter (Signed)
Scheduled appt per 3/6 los - patient is aware of appt date and time - no print out wanted.

## 2017-11-29 ENCOUNTER — Inpatient Hospital Stay: Payer: BLUE CROSS/BLUE SHIELD

## 2017-11-29 DIAGNOSIS — C160 Malignant neoplasm of cardia: Secondary | ICD-10-CM | POA: Diagnosis not present

## 2017-11-29 DIAGNOSIS — Z95828 Presence of other vascular implants and grafts: Secondary | ICD-10-CM

## 2017-11-29 MED ORDER — SODIUM CHLORIDE 0.9% FLUSH
10.0000 mL | INTRAVENOUS | Status: DC | PRN
  Administered 2017-11-29: 10 mL via INTRAVENOUS
  Filled 2017-11-29: qty 10

## 2017-11-29 MED ORDER — HEPARIN SOD (PORK) LOCK FLUSH 100 UNIT/ML IV SOLN
500.0000 [IU] | Freq: Once | INTRAVENOUS | Status: AC | PRN
  Administered 2017-11-29: 500 [IU] via INTRAVENOUS
  Filled 2017-11-29: qty 5

## 2017-11-29 NOTE — Patient Instructions (Signed)

## 2017-12-14 ENCOUNTER — Other Ambulatory Visit: Payer: Self-pay | Admitting: Oncology

## 2017-12-18 ENCOUNTER — Inpatient Hospital Stay: Payer: BLUE CROSS/BLUE SHIELD

## 2017-12-18 ENCOUNTER — Inpatient Hospital Stay (HOSPITAL_BASED_OUTPATIENT_CLINIC_OR_DEPARTMENT_OTHER): Payer: BLUE CROSS/BLUE SHIELD | Admitting: Oncology

## 2017-12-18 ENCOUNTER — Telehealth: Payer: Self-pay | Admitting: Oncology

## 2017-12-18 VITALS — BP 141/91 | HR 86 | Temp 97.7°F | Resp 18 | Ht 66.0 in | Wt 163.7 lb

## 2017-12-18 DIAGNOSIS — Z95828 Presence of other vascular implants and grafts: Secondary | ICD-10-CM

## 2017-12-18 DIAGNOSIS — C160 Malignant neoplasm of cardia: Secondary | ICD-10-CM

## 2017-12-18 DIAGNOSIS — R110 Nausea: Secondary | ICD-10-CM

## 2017-12-18 DIAGNOSIS — T451X5A Adverse effect of antineoplastic and immunosuppressive drugs, initial encounter: Secondary | ICD-10-CM

## 2017-12-18 DIAGNOSIS — G620 Drug-induced polyneuropathy: Secondary | ICD-10-CM | POA: Diagnosis not present

## 2017-12-18 DIAGNOSIS — I10 Essential (primary) hypertension: Secondary | ICD-10-CM

## 2017-12-18 DIAGNOSIS — E119 Type 2 diabetes mellitus without complications: Secondary | ICD-10-CM

## 2017-12-18 DIAGNOSIS — C778 Secondary and unspecified malignant neoplasm of lymph nodes of multiple regions: Secondary | ICD-10-CM

## 2017-12-18 LAB — CBC WITH DIFFERENTIAL (CANCER CENTER ONLY)
BASOS ABS: 0 10*3/uL (ref 0.0–0.1)
Basophils Relative: 1 %
Eosinophils Absolute: 0.2 10*3/uL (ref 0.0–0.5)
Eosinophils Relative: 4 %
HEMATOCRIT: 36.4 % — AB (ref 38.4–49.9)
Hemoglobin: 12.1 g/dL — ABNORMAL LOW (ref 13.0–17.1)
LYMPHS ABS: 1.2 10*3/uL (ref 0.9–3.3)
LYMPHS PCT: 21 %
MCH: 27.3 pg (ref 27.2–33.4)
MCHC: 33.4 g/dL (ref 32.0–36.0)
MCV: 81.8 fL (ref 79.3–98.0)
MONO ABS: 0.5 10*3/uL (ref 0.1–0.9)
Monocytes Relative: 9 %
NEUTROS ABS: 3.6 10*3/uL (ref 1.5–6.5)
Neutrophils Relative %: 65 %
Platelet Count: 266 10*3/uL (ref 140–400)
RBC: 4.45 MIL/uL (ref 4.20–5.82)
RDW: 15.8 % — AB (ref 11.0–14.6)
WBC Count: 5.6 10*3/uL (ref 4.0–10.3)

## 2017-12-18 LAB — CMP (CANCER CENTER ONLY)
ALBUMIN: 3.6 g/dL (ref 3.5–5.0)
ALT: 16 U/L (ref 0–55)
ANION GAP: 10 (ref 3–11)
AST: 13 U/L (ref 5–34)
Alkaline Phosphatase: 96 U/L (ref 40–150)
BILIRUBIN TOTAL: 0.3 mg/dL (ref 0.2–1.2)
BUN: 9 mg/dL (ref 7–26)
CHLORIDE: 107 mmol/L (ref 98–109)
CO2: 24 mmol/L (ref 22–29)
Calcium: 9.8 mg/dL (ref 8.4–10.4)
Creatinine: 0.81 mg/dL (ref 0.70–1.30)
GFR, Est AFR Am: >60 mL/min (ref >60)
GFR, Estimated: >60 mL/min (ref >60)
GLUCOSE: 180 mg/dL — AB (ref 70–140)
POTASSIUM: 3.6 mmol/L (ref 3.5–5.1)
SODIUM: 141 mmol/L (ref 136–145)
Total Protein: 6.8 g/dL (ref 6.4–8.3)

## 2017-12-18 MED ORDER — FOSAPREPITANT DIMEGLUMINE INJECTION 150 MG
Freq: Once | INTRAVENOUS | Status: AC
  Administered 2017-12-18: 10:00:00 via INTRAVENOUS
  Filled 2017-12-18: qty 5

## 2017-12-18 MED ORDER — ATROPINE SULFATE 1 MG/ML IJ SOLN
INTRAMUSCULAR | Status: AC
  Filled 2017-12-18: qty 1

## 2017-12-18 MED ORDER — ATROPINE SULFATE 1 MG/ML IJ SOLN
0.5000 mg | Freq: Once | INTRAMUSCULAR | Status: AC | PRN
  Administered 2017-12-18: 0.5 mg via INTRAVENOUS

## 2017-12-18 MED ORDER — FLUOROURACIL CHEMO INJECTION 2.5 GM/50ML
400.0000 mg/m2 | Freq: Once | INTRAVENOUS | Status: AC
  Administered 2017-12-18: 750 mg via INTRAVENOUS
  Filled 2017-12-18: qty 15

## 2017-12-18 MED ORDER — SODIUM CHLORIDE 0.9 % IV SOLN
400.0000 mg/m2 | Freq: Once | INTRAVENOUS | Status: AC
  Administered 2017-12-18: 764 mg via INTRAVENOUS
  Filled 2017-12-18: qty 38.2

## 2017-12-18 MED ORDER — IRINOTECAN HCL CHEMO INJECTION 100 MG/5ML
180.0000 mg/m2 | Freq: Once | INTRAVENOUS | Status: AC
  Administered 2017-12-18: 340 mg via INTRAVENOUS
  Filled 2017-12-18: qty 17

## 2017-12-18 MED ORDER — SODIUM CHLORIDE 0.9 % IV SOLN
2400.0000 mg/m2 | INTRAVENOUS | Status: DC
  Administered 2017-12-18: 4600 mg via INTRAVENOUS
  Filled 2017-12-18: qty 92

## 2017-12-18 MED ORDER — PALONOSETRON HCL INJECTION 0.25 MG/5ML
INTRAVENOUS | Status: AC
  Filled 2017-12-18: qty 5

## 2017-12-18 MED ORDER — PALONOSETRON HCL INJECTION 0.25 MG/5ML
0.2500 mg | Freq: Once | INTRAVENOUS | Status: AC
  Administered 2017-12-18: 0.25 mg via INTRAVENOUS

## 2017-12-18 MED ORDER — SODIUM CHLORIDE 0.9% FLUSH
10.0000 mL | INTRAVENOUS | Status: DC | PRN
  Administered 2017-12-18: 10 mL via INTRAVENOUS
  Filled 2017-12-18: qty 10

## 2017-12-18 MED ORDER — SODIUM CHLORIDE 0.9 % IV SOLN
Freq: Once | INTRAVENOUS | Status: AC
  Administered 2017-12-18: 10:00:00 via INTRAVENOUS

## 2017-12-18 NOTE — Progress Notes (Signed)
[facility] OFFICE PROGRESS NOTE   Diagnosis: Gastric cancer  INTERVAL HISTORY:   Robert Beck returns as scheduled.  He completed another cycle of FOLFIRI 11/27/2017.  He had less nausea with this cycle of chemotherapy.  Restless legs have improved since he discontinued Compazine.  Stable peripheral neuropathy symptoms.  No dysphasia.  He has noted neck discomfort for the past few weeks.  He is not taking pain medication for this.  Objective:  Vital signs in last 24 hours:  Blood pressure (!) 141/91, pulse 86, temperature 97.7 F (36.5 C), temperature source Oral, resp. rate 18, height 5' 6" (1.676 m), weight 163 lb 11.2 oz (74.3 kg), SpO2 99 %.    HEENT: No thrush or ulcers Resp: Lungs clear bilaterally Cardio: Regular rate and rhythm GI: No hepatomegaly, nontender Vascular: No leg edema    Portacath/PICC-without erythema  Lab Results:  Lab Results  Component Value Date   WBC 5.6 12/18/2017   HGB 12.0 (L) 11/06/2017   HCT 36.4 (L) 12/18/2017   MCV 81.8 12/18/2017   PLT 266 12/18/2017   NEUTROABS 3.6 12/18/2017    CMP     Component Value Date/Time   NA 141 12/18/2017 0825   NA 140 09/25/2017 0855   K 3.6 12/18/2017 0825   K 3.6 09/25/2017 0855   CL 107 12/18/2017 0825   CO2 24 12/18/2017 0825   CO2 22 09/25/2017 0855   GLUCOSE 180 (H) 12/18/2017 0825   GLUCOSE 211 (H) 09/25/2017 0855   BUN 9 12/18/2017 0825   BUN 10.1 09/25/2017 0855   CREATININE 0.81 12/18/2017 0825   CREATININE 0.9 09/25/2017 0855   CALCIUM 9.8 12/18/2017 0825   CALCIUM 9.8 09/25/2017 0855   PROT 6.8 12/18/2017 0825   PROT 7.2 09/25/2017 0855   ALBUMIN 3.6 12/18/2017 0825   ALBUMIN 4.0 09/25/2017 0855   AST 13 12/18/2017 0825   AST 22 09/25/2017 0855   ALT 16 12/18/2017 0825   ALT 33 09/25/2017 0855   ALKPHOS 96 12/18/2017 0825   ALKPHOS 118 09/25/2017 0855   BILITOT 0.3 12/18/2017 0825   BILITOT 0.39 09/25/2017 0855   GFRNONAA >60 12/18/2017 0825   GFRAA >60  12/18/2017 0825    Lab Results  Component Value Date   CEA1 3.83 06/27/2016     Medications: I have reviewed the patient's current medications.   Assessment/Plan: 1. 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 Decreased gastric cardia wall thickening.  Cycle 7 FOLFOX 07/25/2016  Cycle 8 FOLFOX 08/08/2016  Cycle 9 FOLFOX 08/22/2016 (oxaliplatin held due to neuropathy)  Cycle 10 FOLFOX 09/05/2016 (oxaliplatin held)  Cycle 11 FOLFOX 09/26/2016 (oxaliplatin held)  Cycle 12 FOLFOX 10/10/2016 (oxaliplatin held)  CT 10/18/2016-mild enlargement of mediastinal lymph nodes, no other evidence of disease progression, stable thickening at the GE junction and stable gastrohepatic lymphadenopathy  Cycle 13 FOLFOX 10/24/2016 (oxaliplatin held)  Maintenance Xeloda 7 days on/7 days off beginning 11/17/2016  Restaging chest CT 01/22/2017-interval  progression of metastatic lymphadenopathy in the mediastinum and gastrohepatic ligament.  Salvage therapy with FOLFIRI initiated 02/20/2017  Cycle 5 FOLFIRI 04/17/2017  Restaging chest CT 04/29/2017-mild improvement in mediastinal adenopathy. No significant change in adenopathy in the gastrohepatic ligament. No evidence of disease  progression.  Cycle 6 FOLFIRI 05/01/2017  Cycle 7 FOLFIRI 05/15/2017  Cycle 8 FOLFIRI 05/29/2017  Cycle 9 FOLFIRI 06/19/2017  Cycle 10 FOLFIRI 07/03/2017  Cycle 11 FOLFIRI 07/17/2017  CT chest 07/30/2017-mild decrease in mediastinal and gastrohepatic ligament lymphadenopathy, no evidence of progressive disease  Cycle 12 FOLFIRI 07/31/2017  Cycle 13 FOLFIRI 08/14/2017  Cycle 14 FOLFIRI 09/04/2017  Cycle 15 FOLFIRI 09/25/2017  Cycle 16 FOLFIRI 10/16/2017  Cycle 17 FOLFIRI 11/06/2017  Cycle 18 FOLFIRI 11/27/2017   cycle 19 FOLFIRI 12/18/2017  2. Anemia secondary to #1-improved  3. Diabetes  4. Gout  5. Hypertension  6. Hyperlipidemia  7. History of a T7 compression fracture  8. Pain secondary to the gastric mass and mediastinal lymphadenopathy-resolved  9. Port-A-Cath placement 04/20/2016  10. 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 1 mm obstructing stone distal right ureter/right ureteral vesicle junction on CT 09/13/2016.  13. 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 Hypercalcemia 12/19/2016-asymptomatic  15. Nausea following FOLFIRI-emend added beginning with cycle 3, trial of Reglan beginning12/08/2017, persistent   Disposition: Robert Beck appears stable.  He will complete another cycle of FOLFIRI today.  He will undergo a restaging CT prior to the next cycle of chemotherapy in 3 weeks.  15 minutes were spent with the patient today.  The majority of the time was used for counseling and coordination of care.  Betsy Coder, MD  12/18/2017  9:33 AM

## 2017-12-18 NOTE — Telephone Encounter (Signed)
Scheduled appt per 3/27 los - patient is aware of appt date and time.  

## 2017-12-18 NOTE — Patient Instructions (Signed)
[facility] Discharge Instructions for Patients Receiving Chemotherapy  Today you received the following chemotherapy agents:  Irinotecan, Leucovorin, and 5FU.   To help prevent nausea and vomiting after your treatment, we encourage you to take your nausea medication as directed.   If you develop nausea and vomiting that is not controlled by your nausea medication, call the clinic.   BELOW ARE SYMPTOMS THAT SHOULD BE REPORTED IMMEDIATELY:  *FEVER GREATER THAN 100.5 F  *CHILLS WITH OR WITHOUT FEVER  NAUSEA AND VOMITING THAT IS NOT CONTROLLED WITH YOUR NAUSEA MEDICATION  *UNUSUAL SHORTNESS OF BREATH  *UNUSUAL BRUISING OR BLEEDING  TENDERNESS IN MOUTH AND THROAT WITH OR WITHOUT PRESENCE OF ULCERS  *URINARY PROBLEMS  *BOWEL PROBLEMS  UNUSUAL RASH Items with * indicate a potential emergency and should be followed up as soon as possible.  Feel free to call the clinic should you have any questions or concerns. The clinic phone number is [phone].  Please show the CHEMO ALERT CARD at check-in to the Emergency Department and triage nurse.   

## 2017-12-19 MED ORDER — ONDANSETRON HCL 8 MG PO TABS: 8.0000 mg | ORAL_TABLET | Freq: Three times a day (TID) | ORAL | 1 refills | Status: DC | PRN

## 2017-12-19 MED ORDER — POTASSIUM CHLORIDE ER 10 MEQ PO CPCR: 10.0000 meq | ORAL_CAPSULE | Freq: Two times a day (BID) | ORAL | 1 refills | Status: DC

## 2017-12-19 NOTE — Addendum Note (Signed)
Addended by: Mathis Fare on: 12/19/2017 10:18 AM   Modules accepted: Orders

## 2017-12-20 ENCOUNTER — Inpatient Hospital Stay: Payer: BLUE CROSS/BLUE SHIELD

## 2017-12-20 VITALS — BP 142/78 | HR 87 | Temp 98.7°F | Resp 17

## 2017-12-20 DIAGNOSIS — C160 Malignant neoplasm of cardia: Secondary | ICD-10-CM | POA: Diagnosis not present

## 2017-12-20 MED ORDER — HEPARIN SOD (PORK) LOCK FLUSH 100 UNIT/ML IV SOLN
500.0000 [IU] | Freq: Once | INTRAVENOUS | Status: AC | PRN
  Administered 2017-12-20: 500 [IU]
  Filled 2017-12-20: qty 5

## 2017-12-20 MED ORDER — SODIUM CHLORIDE 0.9% FLUSH
10.0000 mL | INTRAVENOUS | Status: DC | PRN
  Administered 2017-12-20: 10 mL
  Filled 2017-12-20: qty 10

## 2018-01-05 ENCOUNTER — Other Ambulatory Visit: Payer: Self-pay | Admitting: Oncology

## 2018-01-06 ENCOUNTER — Encounter (HOSPITAL_COMMUNITY): Payer: Self-pay

## 2018-01-06 ENCOUNTER — Ambulatory Visit (HOSPITAL_COMMUNITY)
Admission: RE | Admit: 2018-01-06 | Discharge: 2018-01-06 | Disposition: A | Discharge status: Home / Self Care | Payer: BLUE CROSS/BLUE SHIELD | Source: Ambulatory Visit | Attending: Oncology | Admitting: Oncology

## 2018-01-06 DIAGNOSIS — K7460 Unspecified cirrhosis of liver: Secondary | ICD-10-CM | POA: Diagnosis not present

## 2018-01-06 DIAGNOSIS — C160 Malignant neoplasm of cardia: Secondary | ICD-10-CM | POA: Insufficient documentation

## 2018-01-06 DIAGNOSIS — I2510 Atherosclerotic heart disease of native coronary artery without angina pectoris: Secondary | ICD-10-CM | POA: Diagnosis not present

## 2018-01-06 DIAGNOSIS — I700 Atherosclerosis of aorta: Secondary | ICD-10-CM | POA: Insufficient documentation

## 2018-01-06 DIAGNOSIS — R590 Localized enlarged lymph nodes: Secondary | ICD-10-CM | POA: Insufficient documentation

## 2018-01-06 MED ORDER — IOHEXOL 300 MG/ML  SOLN
75.0000 mL | Freq: Once | INTRAMUSCULAR | Status: AC | PRN
  Administered 2018-01-06: 75 mL via INTRAVENOUS

## 2018-01-08 ENCOUNTER — Inpatient Hospital Stay: Payer: BLUE CROSS/BLUE SHIELD | Attending: Oncology

## 2018-01-08 ENCOUNTER — Inpatient Hospital Stay (HOSPITAL_BASED_OUTPATIENT_CLINIC_OR_DEPARTMENT_OTHER): Payer: BLUE CROSS/BLUE SHIELD | Admitting: Oncology

## 2018-01-08 ENCOUNTER — Inpatient Hospital Stay: Payer: BLUE CROSS/BLUE SHIELD

## 2018-01-08 ENCOUNTER — Telehealth: Payer: Self-pay | Admitting: Oncology

## 2018-01-08 VITALS — BP 146/90 | HR 97 | Temp 98.2°F | Resp 18 | Ht 66.0 in | Wt 166.3 lb

## 2018-01-08 DIAGNOSIS — I10 Essential (primary) hypertension: Secondary | ICD-10-CM | POA: Insufficient documentation

## 2018-01-08 DIAGNOSIS — Z5111 Encounter for antineoplastic chemotherapy: Secondary | ICD-10-CM | POA: Insufficient documentation

## 2018-01-08 DIAGNOSIS — R110 Nausea: Secondary | ICD-10-CM | POA: Diagnosis not present

## 2018-01-08 DIAGNOSIS — Z452 Encounter for adjustment and management of vascular access device: Secondary | ICD-10-CM | POA: Insufficient documentation

## 2018-01-08 DIAGNOSIS — Z794 Long term (current) use of insulin: Secondary | ICD-10-CM | POA: Diagnosis not present

## 2018-01-08 DIAGNOSIS — C160 Malignant neoplasm of cardia: Secondary | ICD-10-CM

## 2018-01-08 DIAGNOSIS — T451X5A Adverse effect of antineoplastic and immunosuppressive drugs, initial encounter: Secondary | ICD-10-CM

## 2018-01-08 DIAGNOSIS — E119 Type 2 diabetes mellitus without complications: Secondary | ICD-10-CM | POA: Insufficient documentation

## 2018-01-08 DIAGNOSIS — R221 Localized swelling, mass and lump, neck: Secondary | ICD-10-CM | POA: Diagnosis not present

## 2018-01-08 DIAGNOSIS — C770 Secondary and unspecified malignant neoplasm of lymph nodes of head, face and neck: Secondary | ICD-10-CM | POA: Insufficient documentation

## 2018-01-08 DIAGNOSIS — G620 Drug-induced polyneuropathy: Secondary | ICD-10-CM

## 2018-01-08 DIAGNOSIS — Z95828 Presence of other vascular implants and grafts: Secondary | ICD-10-CM

## 2018-01-08 LAB — CBC WITH DIFFERENTIAL (CANCER CENTER ONLY)
BASOS PCT: 1 %
Basophils Absolute: 0 10*3/uL (ref 0.0–0.1)
EOS ABS: 0.1 10*3/uL (ref 0.0–0.5)
Eosinophils Relative: 2 %
HCT: 36.8 % — ABNORMAL LOW (ref 38.4–49.9)
Hemoglobin: 12.1 g/dL — ABNORMAL LOW (ref 13.0–17.1)
LYMPHS ABS: 1.2 10*3/uL (ref 0.9–3.3)
Lymphocytes Relative: 21 %
MCH: 26.7 pg — AB (ref 27.2–33.4)
MCHC: 32.8 g/dL (ref 32.0–36.0)
MCV: 81.2 fL (ref 79.3–98.0)
MONO ABS: 0.6 10*3/uL (ref 0.1–0.9)
MONOS PCT: 11 %
Neutro Abs: 3.8 10*3/uL (ref 1.5–6.5)
Neutrophils Relative %: 65 %
Platelet Count: 258 10*3/uL (ref 140–400)
RBC: 4.54 MIL/uL (ref 4.20–5.82)
RDW: 16.7 % — AB (ref 11.0–14.6)
WBC Count: 5.8 10*3/uL (ref 4.0–10.3)

## 2018-01-08 LAB — CMP (CANCER CENTER ONLY)
ALBUMIN: 3.7 g/dL (ref 3.5–5.0)
ALK PHOS: 98 U/L (ref 40–150)
ALT: 15 U/L (ref 0–55)
AST: 14 U/L (ref 5–34)
Anion gap: 8 (ref 3–11)
BUN: 12 mg/dL (ref 7–26)
CALCIUM: 9.5 mg/dL (ref 8.4–10.4)
CHLORIDE: 107 mmol/L (ref 98–109)
CO2: 26 mmol/L (ref 22–29)
CREATININE: 0.82 mg/dL (ref 0.70–1.30)
GFR, Est AFR Am: >60 mL/min (ref >60)
GFR, Estimated: >60 mL/min (ref >60)
GLUCOSE: 244 mg/dL — AB (ref 70–140)
Potassium: 3.8 mmol/L (ref 3.5–5.1)
SODIUM: 141 mmol/L (ref 136–145)
Total Bilirubin: 0.4 mg/dL (ref 0.2–1.2)
Total Protein: 6.9 g/dL (ref 6.4–8.3)

## 2018-01-08 MED ORDER — SODIUM CHLORIDE 0.9% FLUSH
10.0000 mL | INTRAVENOUS | Status: DC | PRN
  Administered 2018-01-08: 10 mL via INTRAVENOUS
  Filled 2018-01-08: qty 10

## 2018-01-08 MED ORDER — ATROPINE SULFATE 1 MG/ML IJ SOLN
0.5000 mg | Freq: Once | INTRAMUSCULAR | Status: AC | PRN
  Administered 2018-01-08: 0.5 mg via INTRAVENOUS

## 2018-01-08 MED ORDER — FLUOROURACIL CHEMO INJECTION 2.5 GM/50ML
400.0000 mg/m2 | Freq: Once | INTRAVENOUS | Status: AC
  Administered 2018-01-08: 750 mg via INTRAVENOUS
  Filled 2018-01-08: qty 15

## 2018-01-08 MED ORDER — ATROPINE SULFATE 1 MG/ML IJ SOLN
INTRAMUSCULAR | Status: AC
  Filled 2018-01-08: qty 1

## 2018-01-08 MED ORDER — SODIUM CHLORIDE 0.9 % IV SOLN
Freq: Once | INTRAVENOUS | Status: AC
  Administered 2018-01-08: 10:00:00 via INTRAVENOUS
  Filled 2018-01-08: qty 5

## 2018-01-08 MED ORDER — SODIUM CHLORIDE 0.9 % IV SOLN
Freq: Once | INTRAVENOUS | Status: AC
  Administered 2018-01-08: 10:00:00 via INTRAVENOUS

## 2018-01-08 MED ORDER — FLUOROURACIL CHEMO INJECTION 5 GM/100ML
2400.0000 mg/m2 | INTRAVENOUS | Status: DC
  Administered 2018-01-08: 4600 mg via INTRAVENOUS
  Filled 2018-01-08: qty 92

## 2018-01-08 MED ORDER — SODIUM CHLORIDE 0.9 % IV SOLN
180.0000 mg/m2 | Freq: Once | INTRAVENOUS | Status: AC
  Administered 2018-01-08: 340 mg via INTRAVENOUS
  Filled 2018-01-08: qty 17

## 2018-01-08 MED ORDER — PALONOSETRON HCL INJECTION 0.25 MG/5ML
0.2500 mg | Freq: Once | INTRAVENOUS | Status: AC
  Administered 2018-01-08: 0.25 mg via INTRAVENOUS

## 2018-01-08 MED ORDER — PALONOSETRON HCL INJECTION 0.25 MG/5ML
INTRAVENOUS | Status: AC
  Filled 2018-01-08: qty 5

## 2018-01-08 MED ORDER — SODIUM CHLORIDE 0.9 % IV SOLN
400.0000 mg/m2 | Freq: Once | INTRAVENOUS | Status: AC
  Administered 2018-01-08: 764 mg via INTRAVENOUS
  Filled 2018-01-08: qty 38.2

## 2018-01-08 NOTE — Patient Instructions (Signed)
[facility] Discharge Instructions for Patients Receiving Chemotherapy  Today you received the following chemotherapy agents Irinotecan, Leucovorin and Adrucil  To help prevent nausea and vomiting after your treatment, we encourage you to take your nausea medication as directed.    If you develop nausea and vomiting that is not controlled by your nausea medication, call the clinic.   BELOW ARE SYMPTOMS THAT SHOULD BE REPORTED IMMEDIATELY:  *FEVER GREATER THAN 100.5 F  *CHILLS WITH OR WITHOUT FEVER  NAUSEA AND VOMITING THAT IS NOT CONTROLLED WITH YOUR NAUSEA MEDICATION  *UNUSUAL SHORTNESS OF BREATH  *UNUSUAL BRUISING OR BLEEDING  TENDERNESS IN MOUTH AND THROAT WITH OR WITHOUT PRESENCE OF ULCERS  *URINARY PROBLEMS  *BOWEL PROBLEMS  UNUSUAL RASH Items with * indicate a potential emergency and should be followed up as soon as possible.  Feel free to call the clinic should you have any questions or concerns. The clinic phone number is [phone].  Please show the CHEMO ALERT CARD at check-in to the Emergency Department and triage nurse.   

## 2018-01-08 NOTE — Progress Notes (Signed)
[facility] OFFICE PROGRESS NOTE   Diagnosis: Gastric cancer  INTERVAL HISTORY:   Robert Beck returns as scheduled.  He completed another cycle of FOLFIRI 12/18/2017.  He reports less nausea and diarrhea following this cycle of chemotherapy.  No difficulty with swallowing.  His voice remains adequate.  No new complaint.  Objective:  Vital signs in last 24 hours:  Blood pressure (!) 146/90, pulse 97, temperature 98.2 F (36.8 C), temperature source Oral, resp. rate 18, height '5\' 6"'  (1.676 m), weight 166 lb 4.8 oz (75.4 kg), SpO2 98 %.    HEENT: No thrush or ulcers Lymphatics:?  1/2-1 cm firm node in the right lower cervical chain Resp: Lungs clear bilaterally Cardio: Regular rate and rhythm GI: No hepatosplenomegaly Vascular: No leg edema    Portacath/PICC-without erythema  Lab Results:  Lab Results  Component Value Date   WBC 5.8 01/08/2018   HGB 12.0 (L) 11/06/2017   HCT 36.8 (L) 01/08/2018   MCV 81.2 01/08/2018   PLT 258 01/08/2018   NEUTROABS 3.8 01/08/2018    CMP     Component Value Date/Time   NA 141 01/08/2018 0803   NA 140 09/25/2017 0855   K 3.8 01/08/2018 0803   K 3.6 09/25/2017 0855   CL 107 01/08/2018 0803   CO2 26 01/08/2018 0803   CO2 22 09/25/2017 0855   GLUCOSE 244 (H) 01/08/2018 0803   GLUCOSE 211 (H) 09/25/2017 0855   BUN 12 01/08/2018 0803   BUN 10.1 09/25/2017 0855   CREATININE 0.82 01/08/2018 0803   CREATININE 0.9 09/25/2017 0855   CALCIUM 9.5 01/08/2018 0803   CALCIUM 9.8 09/25/2017 0855   PROT 6.9 01/08/2018 0803   PROT 7.2 09/25/2017 0855   ALBUMIN 3.7 01/08/2018 0803   ALBUMIN 4.0 09/25/2017 0855   AST 14 01/08/2018 0803   AST 22 09/25/2017 0855   ALT 15 01/08/2018 0803   ALT 33 09/25/2017 0855   ALKPHOS 98 01/08/2018 0803   ALKPHOS 118 09/25/2017 0855   BILITOT 0.4 01/08/2018 0803   BILITOT 0.39 09/25/2017 0855   GFRNONAA >60 01/08/2018 0803   GFRAA >60 01/08/2018 0803    Lab Results  Component Value  Date   CEA1 3.83 06/27/2016    Lab Results  Component Value Date   INR 1.10 04/20/2016    Imaging:  Ct Chest W Contrast  Result Date: 01/06/2018 CLINICAL DATA:  Gastric cancer, ongoing chemotherapy. EXAM: CT CHEST WITH CONTRAST TECHNIQUE: Multidetector CT imaging of the chest was performed during intravenous contrast administration. CONTRAST:  34m OMNIPAQUE IOHEXOL 300 MG/ML  SOLN COMPARISON:  07/30/2017. FINDINGS: Cardiovascular: Right IJ Port-A-Cath terminates in the SVC. Atherosclerotic calcification of the arterial vasculature, including three-vessel involvement of the coronary arteries. Heart size normal. No pericardial effusion. Mediastinum/Nodes: Low right internal jugular/anterior scalene lymph nodes measure up to at least 1.7 cm, incompletely imaged, similar to minimally enlarged, previously 1.5 cm. Mediastinal adenopathy measures up to 1.8 cm in the low right paratracheal station and again, stable to minimally enlarged. No hilar or axillary adenopathy. Esophagus is grossly unremarkable. Lungs/Pleura: Minimal dependent subpleural volume loss bilaterally. Lungs are otherwise clear. No pleural fluid. Airway is unremarkable. Upper Abdomen: Subcentimeter low-attenuation lesion in the periphery of the left hepatic lobe is unchanged. Liver margin is irregular. Recanalized paraumbilical vein. Slight nodular thickening of both adrenal glands. Visualized portion of the right kidney is unremarkable. Low-attenuation lesion in the left kidney measures 3.6 cm and is likely a cyst. Visualized portions of the spleen,  pancreas, stomach and bowel are grossly unremarkable. Gastrohepatic ligament adenopathy measures up to 2.2 cm, previously 1.6 cm. Periportal and portacaval lymph nodes measure up to 1.2 cm, as before. Musculoskeletal: Degenerative changes in the spine. No worrisome lytic or sclerotic lesions. Midthoracic compression deformity is unchanged. IMPRESSION: 1. Stable to minimally enlarged low right  internal jugular/anterior scalene and mediastinal lymph nodes. 2. Enlarging gastrohepatic ligament lymph nodes. 3. Aortic atherosclerosis (ICD10-170.0). Three-vessel coronary artery calcification. 4. Cirrhosis. Electronically Signed   By: Lorin Picket M.D.   On: 01/06/2018 15:16    Medications: I have reviewed the patient's current medications.   Assessment/Plan: 1. 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 Decreased gastric cardia wall thickening.  Cycle 7 FOLFOX 07/25/2016  Cycle 8 FOLFOX 08/08/2016  Cycle 9 FOLFOX 08/22/2016 (oxaliplatin held due to neuropathy)  Cycle 10 FOLFOX 09/05/2016 (oxaliplatin held)  Cycle 11 FOLFOX 09/26/2016 (oxaliplatin held)  Cycle 12 FOLFOX 10/10/2016 (oxaliplatin held)  CT 10/18/2016-mild enlargement of mediastinal lymph nodes, no other evidence of disease progression, stable thickening at the GE junction and stable gastrohepatic lymphadenopathy  Cycle 13 FOLFOX 10/24/2016 (oxaliplatin held)  Maintenance Xeloda 7 days on/7 days off beginning 11/17/2016  Restaging chest CT 01/22/2017-interval progression of metastatic lymphadenopathy in the mediastinum and gastrohepatic ligament.  Salvage therapy with FOLFIRI initiated 02/20/2017  Cycle 5  FOLFIRI 04/17/2017  Restaging chest CT 04/29/2017-mild improvement in mediastinal adenopathy. No significant change in adenopathy in the gastrohepatic ligament. No evidence of disease progression.  Cycle 6 FOLFIRI 05/01/2017  Cycle 7 FOLFIRI 05/15/2017  Cycle 8 FOLFIRI 05/29/2017  Cycle 9 FOLFIRI 06/19/2017  Cycle 10 FOLFIRI 07/03/2017  Cycle 11 FOLFIRI 07/17/2017  CT chest 07/30/2017-mild decrease in mediastinal and gastrohepatic ligament lymphadenopathy, no evidence of progressive disease  Cycle 12 FOLFIRI 07/31/2017  Cycle 13 FOLFIRI 08/14/2017  Cycle 14 FOLFIRI 09/04/2017  Cycle 15 FOLFIRI 09/25/2017  Cycle 16 FOLFIRI 10/16/2017  Cycle 17 FOLFIRI 11/06/2017  Cycle 18 FOLFIRI 11/27/2017   cycle 19 FOLFIRI 12/18/2017   CT 01/06/2018-stable neck/mediastinal nodes, slight enlargement of gastrohepatic ligament node stable periportal and portacaval nodes  Cycle 20 FOLFIRI 01/08/2018  2. Anemia secondary to #1-improved  3. Diabetes  4. Gout  5. Hypertension  6. Hyperlipidemia  7. History of a T7 compression fracture  8. Pain secondary to the gastric mass and mediastinal lymphadenopathy-resolved  9. Port-A-Cath placement 04/20/2016  10. 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 1 mm obstructing stone distal right ureter/right ureteral vesicle junction on CT 09/13/2016.  13. 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 Hypercalcemia 12/19/2016-asymptomatic  15. Nausea following FOLFIRI-emend added beginning with cycle 3, trial of Reglan beginning12/08/2017, persistent  Disposition: Robert Beck appears unchanged.  I reviewed the 01/06/2018 CT images.  There appears to be slight enlargement of the gastrohepatic nodes, but the other nodes do not appear  significantly changed.  There is no clinical evidence of disease progression and there are no new sites of metastatic disease on the CT.  I recommended continuing FOLFIRI.  He agrees.  He will complete another treatment with FOLFIRI today. Robert Beck will return for an office visit and chemotherapy in 3 weeks.  We will plan for a repeat biopsy to obtain tissue for molecular testing (these including PD1 testing) if he develops progressive disease.  25 minutes were spent with the patient today.  The majority of the time was used for counseling and coordination of care.  Betsy Coder, MD  01/08/2018  9:33 AM

## 2018-01-08 NOTE — Telephone Encounter (Signed)
Scheduled appt per 4/17 los - patient is aware of appt date and time.  

## 2018-01-10 ENCOUNTER — Inpatient Hospital Stay: Payer: BLUE CROSS/BLUE SHIELD

## 2018-01-10 DIAGNOSIS — C160 Malignant neoplasm of cardia: Secondary | ICD-10-CM | POA: Diagnosis not present

## 2018-01-10 DIAGNOSIS — Z95828 Presence of other vascular implants and grafts: Secondary | ICD-10-CM

## 2018-01-10 MED ORDER — HEPARIN SOD (PORK) LOCK FLUSH 100 UNIT/ML IV SOLN
500.0000 [IU] | Freq: Once | INTRAVENOUS | Status: AC | PRN
  Administered 2018-01-10: 500 [IU] via INTRAVENOUS
  Filled 2018-01-10: qty 5

## 2018-01-10 MED ORDER — SODIUM CHLORIDE 0.9% FLUSH
10.0000 mL | INTRAVENOUS | Status: DC | PRN
  Administered 2018-01-10: 10 mL via INTRAVENOUS
  Filled 2018-01-10: qty 10

## 2018-01-10 NOTE — Patient Instructions (Signed)

## 2018-01-13 ENCOUNTER — Telehealth: Payer: Self-pay

## 2018-01-13 NOTE — Telephone Encounter (Signed)
Call from pt reporting "swollen lymph node in neck and now the swelling is extending behind his ear near his jaw". Pt reported that they noticed this after his recent treatment. Also noting worsening of neuropathy, and a sore throat. Pt notes that he is still able to eat "just not as much". Alerted MD. Per Dr. Benay Spice pt to be seen tomorrow. Pt voiced understanding and agreeable with plan.

## 2018-01-14 ENCOUNTER — Encounter: Payer: Self-pay | Admitting: Nurse Practitioner

## 2018-01-14 ENCOUNTER — Inpatient Hospital Stay (HOSPITAL_BASED_OUTPATIENT_CLINIC_OR_DEPARTMENT_OTHER): Payer: BLUE CROSS/BLUE SHIELD | Admitting: Nurse Practitioner

## 2018-01-14 ENCOUNTER — Telehealth: Payer: Self-pay | Admitting: Hematology

## 2018-01-14 VITALS — BP 142/90 | HR 95 | Temp 98.5°F | Resp 18 | Ht 66.0 in | Wt 159.6 lb

## 2018-01-14 DIAGNOSIS — Z794 Long term (current) use of insulin: Secondary | ICD-10-CM | POA: Diagnosis not present

## 2018-01-14 DIAGNOSIS — I10 Essential (primary) hypertension: Secondary | ICD-10-CM | POA: Diagnosis not present

## 2018-01-14 DIAGNOSIS — R221 Localized swelling, mass and lump, neck: Secondary | ICD-10-CM | POA: Diagnosis not present

## 2018-01-14 DIAGNOSIS — E119 Type 2 diabetes mellitus without complications: Secondary | ICD-10-CM | POA: Diagnosis not present

## 2018-01-14 DIAGNOSIS — C160 Malignant neoplasm of cardia: Secondary | ICD-10-CM | POA: Diagnosis not present

## 2018-01-14 DIAGNOSIS — C770 Secondary and unspecified malignant neoplasm of lymph nodes of head, face and neck: Secondary | ICD-10-CM

## 2018-01-14 NOTE — Telephone Encounter (Signed)
No los 4/23 °

## 2018-01-14 NOTE — Progress Notes (Addendum)
[place]  [phone] [phone]  Clinic Follow up Note   Patient Care Team: Robert Baton, MD as PCP - General (Internal Medicine) 01/14/2018  DIAGNOSIS: Gastric cancer   INTERVAL HISTORY: Robert Beck presents for evaluation of right neck swelling. He noticed a sore throat last week 2 days before his chemotherapy appointment on 4/17. On day 3 with pump d/c sore throat was mildly worse and he felt his voice was scratchy. He repots losing his voice in the past for 6 months and grew concerned this may be happening again. Saturday he noticed swelling in the rick neck he felt extended up to his right ear. No fever, chills, cough, nasal congestion, dyspnea, or dysphagia. He took oxycodone twice that helped with throat pain. Over the last 2 days sore throat and swelling have improved.   REVIEW OF SYSTEMS:   Constitutional: Denies fevers, chills or abnormal weight loss (+) limiting po intake recently due to hyperglycemia at home  Eyes: Denies blurriness of vision Ears, nose, mouth, throat, and face: Denies mucositis or dental pain (+) sore throat (+) right neck swelling  Respiratory: Denies cough, dyspnea or wheezes Cardiovascular: Denies palpitation, chest discomfort or lower extremity swelling Gastrointestinal:  Denies vomiting, constipation, diarrhea, heartburn, dysphagia, or change in bowel habits (+) mild nausea  Skin: Denies abnormal skin rashes Lymphatics: Denies new lymphadenopathy or easy bruising Neurological:Denies new weaknesses (+) neuropathy  Behavioral/Psych: Mood is stable, no new changes  All other systems were reviewed with the patient and are negative.  MEDICAL HISTORY:  Past Medical History:  Diagnosis Date  . Anemia   . Asthma   . COPD (chronic obstructive pulmonary disease) ([place])   . Depression   . gastric ca dx'd 01/2016  . Gout   . Hyperlipidemia   . Hypertension   . Iron deficiency anemia   . Ischemia   . Osteoarthritis     . RLS (restless legs syndrome)   . Type 2 diabetes, controlled, with neuropathy ([place])   . Vitamin B12 deficiency     SURGICAL HISTORY: Past Surgical History:  Procedure Laterality Date  . ANKLE FRACTURE SURGERY    . CIRCUMCISION  2008  . COLONOSCOPY    . IR GENERIC HISTORICAL  04/20/2016   IR FLUORO GUIDE CV LINE RIGHT 04/20/2016 [facility]  . IR GENERIC HISTORICAL  04/20/2016   IR US GUIDE BX ASP/DRAIN 04/20/2016 [facility]  . IR GENERIC HISTORICAL  04/20/2016   IR US GUIDE VASC ACCESS RIGHT 04/20/2016 [facility]  . KNEE SURGERY    . NOSE SURGERY    . TONGUE SURGERY  2007   small growth removed from underneath his tongue, not cancerous.   Marland Kitchen UPPER GASTROINTESTINAL ENDOSCOPY      I have reviewed the social history and family history with the patient and they are unchanged from previous note.  ALLERGIES:  has No Known Allergies.  MEDICATIONS:  Current Outpatient Medications  Medication Sig Dispense Refill  . doxycycline (VIBRA-TABS) 100 MG tablet     . doxycycline (VIBRAMYCIN) 100 MG capsule Take 100 mg by mouth daily. For gum disease    . ferrous sulfate 325 (65 FE) MG tablet Take 325 mg by mouth 2 (two) times daily with a meal.     . HYDROcodone-acetaminophen (NORCO/VICODIN) 5-325 MG tablet Take 1 tablet by mouth every 6 (six) hours as needed. 40 tablet 0  . insulin lispro protamine-lispro (HUMALOG 75/25 MIX) (75-25) 100 UNIT/ML SUSP injection Inject 40 Units into the  skin daily.    Marland Kitchen levalbuterol (XOPENEX HFA) 45 MCG/ACT inhaler Inhale 2 puffs into the lungs 3 (three) times daily as needed for wheezing.    . lidocaine-prilocaine (EMLA) cream Apply 1 application topically as needed. Apply 1 hr prior to port access and cover with plastic wrap 30 g 0  . LORazepam (ATIVAN) 0.5 MG tablet Take 1 tablet (0.5 mg total) by mouth every 8 (eight) hours as needed for anxiety or sleep (nausea). 30 tablet 0  . metFORMIN (GLUCOPHAGE) 500 MG tablet Take 500 mg by mouth 2 (two) times  daily.  3  . Multiple Vitamin (MULTIVITAMIN WITH MINERALS) TABS tablet Take 1 tablet by mouth daily.    Marland Kitchen olmesartan-hydrochlorothiazide (BENICAR HCT) 40-12.5 MG tablet Take 0.5 tablets by mouth daily.     . ondansetron (ZOFRAN) 8 MG tablet Take 1 tablet (8 mg total) by mouth every 8 (eight) hours as needed for nausea or vomiting. 30 tablet 1  . oxyCODONE (OXY IR/ROXICODONE) 5 MG immediate release tablet Take 5 mg at bedtime as needed by mouth.  0  . potassium chloride (K-DUR,KLOR-CON) 10 MEQ tablet Take 1 tablet (10 mEq total) by mouth 2 (two) times daily. 60 tablet 1  . rOPINIRole (REQUIP) 1 MG tablet Take 1 mg by mouth at bedtime.     . metoCLOPramide (REGLAN) 10 MG tablet Take 1 tablet (10 mg total) by mouth 3 (three) times daily before meals. 90 tablet 2   No current facility-administered medications for this visit.    Facility-Administered Medications Ordered in Other Visits  Medication Dose Route Frequency Provider Last Rate Last Dose  . sodium chloride flush (NS) 0.9 % injection 10 mL  10 mL Intravenous PRN Ladell Pier, MD   10 mL at 06/12/17 1049  . sodium chloride flush (NS) 0.9 % injection 10 mL  10 mL Intravenous PRN Ladell Pier, MD        PHYSICAL EXAMINATION: ECOG PERFORMANCE STATUS: 1 - Symptomatic but completely ambulatory  Vitals:   01/14/18 1015  BP: (!) 142/90  Pulse: 95  Resp: 18  Temp: 98.5 F (36.9 C)  SpO2: 100%   Filed Weights   01/14/18 1015  Weight: 159 lb 9.6 oz (72.4 kg)    GENERAL:alert, no distress and comfortable SKIN: skin color, texture, turgor are normal, no rashes or significant lesions EYES: normal, Conjunctiva are pink and non-injected, sclera clear OROPHARYNX:no exudate, no erythema and lips, buccal mucosa, and tongue normal  NECK: right lower cervical soft tissue fullness  LYMPH:  0.5 cm right lower cervical/[place] lymph node, mild tenderness  LUNGS: clear to auscultation with normal breathing effort CHEST: no venous engorgement    HEART: regular rate & rhythm and no murmurs. No JVD. ABDOMEN:abdomen soft, non-tender and normal bowel sounds Musculoskeletal:no upper extremity edema  NEURO: alert & oriented x 3 with fluent speech PAC without erythema   LABORATORY DATA:  I have reviewed the data as listed CBC Latest Ref Rng & Units 01/08/2018 12/18/2017 11/27/2017  WBC 4.0 - 10.3 K/uL 5.8 5.6 7.5  Hemoglobin 13.0 - 17.1 g/dL 12.1(L) 12.1(L) 12.9(L)  Hematocrit 38.4 - 49.9 % 36.8(L) 36.4(L) 38.9  Platelets 140 - 400 K/uL 258 266 289     CMP Latest Ref Rng & Units 01/08/2018 12/18/2017 11/27/2017  Glucose 70 - 140 mg/dL 244(H) 180(H) 208(H)  BUN 7 - 26 mg/dL '12 9 11  ' Creatinine 0.70 - 1.30 mg/dL 0.82 0.81 0.84  Sodium 136 - 145 mmol/L 141 141 141  Potassium 3.5 - 5.1 mmol/L 3.8 3.6 4.0  Chloride 98 - 109 mmol/L 107 107 106  CO2 22 - 29 mmol/L '26 24 24  ' Calcium 8.4 - 10.4 mg/dL 9.5 9.8 10.5(H)  Total Protein 6.4 - 8.3 g/dL 6.9 6.8 7.4  Total Bilirubin 0.2 - 1.2 mg/dL 0.4 0.3 0.4  Alkaline Phos 40 - 150 U/L 98 96 108  AST 5 - 34 U/L '14 13 16  ' ALT 0 - 55 U/L '15 16 19      ' RADIOGRAPHIC STUDIES: I have personally reviewed the radiological images as listed and agreed with the findings in the report. No results found.   Assessment/Plan: 1. Gastric cancer, gastric cardia mass extendingto the GE junction confirmed on endoscopy 04/11/2016, biopsy confirmed adenocarcinoma  Staging CTs of the chest, abdomen, and pelvis 04/09/2016-gastric cardia mass, mediastinal, right hilar, and right supraclavicular lymphadenopathy  Biopsy of a right supraclavicular lymph node on 04/20/2016 revealed metastatic adenocarcinoma  FoundationACT 02/20/2017-TP53 alteration identified  Cycle 1 FOLFOX 04/30/2016 (oxaliplatin given 04/30/2016, 5-FU infusion started 05/02/2016)  Cycle 2 FOLFOX 05/14/2016  Cycle 3 FOLFOX 05/30/2016  Cycle 4 FOLFOX 06/13/2016  Cycle 5 FOLFOX 06/27/2016  Cycle 6 FOLFOX 07/11/2016  CTs  07/20/2016-decrease in supraclavicular, mediastinal, and gastrohepatic adenopathy. Decreased gastric cardia wall thickening.  Cycle 7 FOLFOX 07/25/2016  Cycle 8 FOLFOX 08/08/2016  Cycle 9 FOLFOX 08/22/2016 (oxaliplatin held due to neuropathy)  Cycle 10 FOLFOX 09/05/2016 (oxaliplatin held)  Cycle 11 FOLFOX 09/26/2016 (oxaliplatin held)  Cycle 12 FOLFOX 10/10/2016 (oxaliplatin held)  CT 10/18/2016-mild enlargement of mediastinal lymph nodes, no other evidence of disease progression, stable thickening at the GE junction and stable gastrohepatic lymphadenopathy  Cycle 13 FOLFOX 10/24/2016 (oxaliplatin held)  Maintenance Xeloda 7 days on/7 days off beginning 11/17/2016  Restaging chest CT 01/22/2017-interval progression of metastatic lymphadenopathy in the mediastinum and gastrohepatic ligament.  Salvage therapy with FOLFIRI initiated 02/20/2017  Cycle 5 FOLFIRI 04/17/2017  Restaging chest CT 04/29/2017-mild improvement in mediastinal adenopathy. No significant change in adenopathy in the gastrohepatic ligament. No evidence of disease progression.  Cycle 6 FOLFIRI 05/01/2017  Cycle 7 FOLFIRI 05/15/2017  Cycle 8 FOLFIRI 05/29/2017  Cycle 9 FOLFIRI 06/19/2017  Cycle 10 FOLFIRI 07/03/2017  Cycle 11 FOLFIRI 07/17/2017  CT chest 07/30/2017-mild decrease in mediastinal and gastrohepatic ligament lymphadenopathy, no evidence of progressive disease  Cycle 12 FOLFIRI 07/31/2017  Cycle 13 FOLFIRI 08/14/2017  Cycle 14 FOLFIRI 09/04/2017  Cycle 15 FOLFIRI 09/25/2017  Cycle 16 FOLFIRI 10/16/2017  Cycle 17 FOLFIRI 11/06/2017  Cycle 18 FOLFIRI 11/27/2017  cycle 19 FOLFIRI 12/18/2017   CT 01/06/2018-stable neck/mediastinal nodes, slight enlargement of gastrohepatic ligament node stable periportal and portacaval nodes  Cycle 20 FOLFIRI 01/08/2018  2. Anemia secondary to #1-improved  3. Diabetes  4. Gout  5. Hypertension  6. Hyperlipidemia  7. History of a T7  compression fracture  8. Pain secondary to the gastric mass and mediastinal lymphadenopathy-resolved  9. Port-A-Cath placement 04/20/2016  10. 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 1 mm obstructing stone distal right ureter/right ureteral vesicle junction on CT 09/13/2016.  13. 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 Hypercalcemia 12/19/2016-asymptomatic  15. Nausea following FOLFIRI-emend added beginning with cycle 3, trial of Reglan beginning12/08/2017, persistent   Disposition: Robert Beck appears stable. He completed cycle 20 FOLFIRI on 01/08/18, tolerated well. He had sore throat prior to chemotherapy that worsened on days 3 and  4 with associated right neck swelling. While he does have low right internal jugular/anterior scalene lymph node measuring approx 1.7 cm on recent CT, neck swelling may also be related to viral illness. Low clinical suspicion for PAC-related venous thrombus. Reviewed signs and symptoms to monitor, such as increased neck swelling and tenderness, upper extremity edema, and venous engorgement in the chest and/or back; if he develops these will obtain doppler study to rule out DVT. He understands. Will return for f/u with Robert Beck for cycle 21 in 2 weeks as previously scheduled, or sooner if needs arise.   The patient was seen with Robert Beck. All questions were answered. The patient knows to call the clinic with any problems, questions or concerns. No barriers to learning was detected. I spent 20 minutes counseling the patient face to face. The total time spent in the appointment was 25 minutes and more than 50% was on counseling and review of test results     Robert Feeling, NP 01/14/18  This was a shared visit with Robert Beck.  Robert Beck was  interviewed and examined.  There may be a small tender lymph node in the right neck, potentially related to a recent upper respiratory infection.  I have a low clinical suspicion for a thrombus, but this is possible.  He will contact us for increased swelling or pain in the right neck.  Robert Manson, MD

## 2018-01-21 ENCOUNTER — Encounter: Payer: Self-pay | Admitting: Nurse Practitioner

## 2018-01-26 ENCOUNTER — Other Ambulatory Visit: Payer: Self-pay | Admitting: Oncology

## 2018-01-29 ENCOUNTER — Inpatient Hospital Stay (HOSPITAL_BASED_OUTPATIENT_CLINIC_OR_DEPARTMENT_OTHER): Payer: BLUE CROSS/BLUE SHIELD | Admitting: Oncology

## 2018-01-29 ENCOUNTER — Telehealth: Payer: Self-pay | Admitting: Oncology

## 2018-01-29 ENCOUNTER — Inpatient Hospital Stay: Payer: BLUE CROSS/BLUE SHIELD

## 2018-01-29 ENCOUNTER — Inpatient Hospital Stay: Payer: BLUE CROSS/BLUE SHIELD | Attending: Oncology

## 2018-01-29 VITALS — BP 146/98 | HR 85 | Temp 97.6°F | Resp 17 | Ht 66.0 in | Wt 164.9 lb

## 2018-01-29 DIAGNOSIS — C160 Malignant neoplasm of cardia: Secondary | ICD-10-CM

## 2018-01-29 DIAGNOSIS — E119 Type 2 diabetes mellitus without complications: Secondary | ICD-10-CM | POA: Insufficient documentation

## 2018-01-29 DIAGNOSIS — Z5111 Encounter for antineoplastic chemotherapy: Secondary | ICD-10-CM | POA: Insufficient documentation

## 2018-01-29 DIAGNOSIS — I10 Essential (primary) hypertension: Secondary | ICD-10-CM | POA: Diagnosis not present

## 2018-01-29 DIAGNOSIS — C770 Secondary and unspecified malignant neoplasm of lymph nodes of head, face and neck: Secondary | ICD-10-CM

## 2018-01-29 DIAGNOSIS — T451X5A Adverse effect of antineoplastic and immunosuppressive drugs, initial encounter: Secondary | ICD-10-CM | POA: Diagnosis not present

## 2018-01-29 DIAGNOSIS — R221 Localized swelling, mass and lump, neck: Secondary | ICD-10-CM | POA: Insufficient documentation

## 2018-01-29 DIAGNOSIS — G620 Drug-induced polyneuropathy: Secondary | ICD-10-CM | POA: Insufficient documentation

## 2018-01-29 DIAGNOSIS — Z95828 Presence of other vascular implants and grafts: Secondary | ICD-10-CM

## 2018-01-29 LAB — CMP (CANCER CENTER ONLY)
ALBUMIN: 3.9 g/dL (ref 3.5–5.0)
ALK PHOS: 115 U/L (ref 40–150)
ALT: 16 U/L (ref 0–55)
ANION GAP: 9 (ref 3–11)
AST: 12 U/L (ref 5–34)
BUN: 10 mg/dL (ref 7–26)
CALCIUM: 10.1 mg/dL (ref 8.4–10.4)
CO2: 26 mmol/L (ref 22–29)
Chloride: 107 mmol/L (ref 98–109)
Creatinine: 0.9 mg/dL (ref 0.70–1.30)
GFR, Est AFR Am: >60 mL/min (ref >60)
GFR, Estimated: >60 mL/min (ref >60)
GLUCOSE: 255 mg/dL — AB (ref 70–140)
Potassium: 4.1 mmol/L (ref 3.5–5.1)
SODIUM: 142 mmol/L (ref 136–145)
Total Bilirubin: 0.3 mg/dL (ref 0.2–1.2)
Total Protein: 7.3 g/dL (ref 6.4–8.3)

## 2018-01-29 LAB — CBC WITH DIFFERENTIAL (CANCER CENTER ONLY)
BASOS PCT: 1 %
Basophils Absolute: 0 10*3/uL (ref 0.0–0.1)
Eosinophils Absolute: 0.2 10*3/uL (ref 0.0–0.5)
Eosinophils Relative: 3 %
HEMATOCRIT: 36.2 % — AB (ref 38.4–49.9)
HEMOGLOBIN: 12.1 g/dL — AB (ref 13.0–17.1)
Lymphocytes Relative: 21 %
Lymphs Abs: 1.2 10*3/uL (ref 0.9–3.3)
MCH: 26.8 pg — ABNORMAL LOW (ref 27.2–33.4)
MCHC: 33.4 g/dL (ref 32.0–36.0)
MCV: 80.3 fL (ref 79.3–98.0)
MONO ABS: 0.7 10*3/uL (ref 0.1–0.9)
Monocytes Relative: 12 %
NEUTROS ABS: 3.8 10*3/uL (ref 1.5–6.5)
NEUTROS PCT: 63 %
Platelet Count: 285 10*3/uL (ref 140–400)
RBC: 4.5 MIL/uL (ref 4.20–5.82)
RDW: 16.2 % — AB (ref 11.0–14.6)
WBC Count: 5.9 10*3/uL (ref 4.0–10.3)

## 2018-01-29 MED ORDER — SODIUM CHLORIDE 0.9 % IV SOLN
Freq: Once | INTRAVENOUS | Status: AC
  Administered 2018-01-29: 10:00:00 via INTRAVENOUS

## 2018-01-29 MED ORDER — PALONOSETRON HCL INJECTION 0.25 MG/5ML
0.2500 mg | Freq: Once | INTRAVENOUS | Status: AC
  Administered 2018-01-29: 0.25 mg via INTRAVENOUS

## 2018-01-29 MED ORDER — ATROPINE SULFATE 1 MG/ML IJ SOLN
INTRAMUSCULAR | Status: AC
  Filled 2018-01-29: qty 1

## 2018-01-29 MED ORDER — FLUOROURACIL CHEMO INJECTION 5 GM/100ML
2400.0000 mg/m2 | INTRAVENOUS | Status: DC
  Administered 2018-01-29: 4600 mg via INTRAVENOUS
  Filled 2018-01-29: qty 92

## 2018-01-29 MED ORDER — DEXAMETHASONE SODIUM PHOSPHATE 10 MG/ML IJ SOLN
INTRAMUSCULAR | Status: AC
  Filled 2018-01-29: qty 1

## 2018-01-29 MED ORDER — ATROPINE SULFATE 1 MG/ML IJ SOLN
0.5000 mg | Freq: Once | INTRAMUSCULAR | Status: AC | PRN
  Administered 2018-01-29: 0.5 mg via INTRAVENOUS

## 2018-01-29 MED ORDER — SODIUM CHLORIDE 0.9 % IV SOLN
180.0000 mg/m2 | Freq: Once | INTRAVENOUS | Status: AC
  Administered 2018-01-29: 340 mg via INTRAVENOUS
  Filled 2018-01-29: qty 17

## 2018-01-29 MED ORDER — PALONOSETRON HCL INJECTION 0.25 MG/5ML
INTRAVENOUS | Status: AC
Start: 2018-01-29 — End: ?
  Filled 2018-01-29: qty 5

## 2018-01-29 MED ORDER — SODIUM CHLORIDE 0.9% FLUSH
10.0000 mL | INTRAVENOUS | Status: DC | PRN
  Administered 2018-01-29: 10 mL via INTRAVENOUS
  Filled 2018-01-29: qty 10

## 2018-01-29 MED ORDER — SODIUM CHLORIDE 0.9 % IV SOLN
Freq: Once | INTRAVENOUS | Status: AC
  Administered 2018-01-29: 10:00:00 via INTRAVENOUS
  Filled 2018-01-29: qty 5

## 2018-01-29 MED ORDER — FLUOROURACIL CHEMO INJECTION 2.5 GM/50ML
400.0000 mg/m2 | Freq: Once | INTRAVENOUS | Status: AC
  Administered 2018-01-29: 750 mg via INTRAVENOUS
  Filled 2018-01-29: qty 15

## 2018-01-29 MED ORDER — SODIUM CHLORIDE 0.9 % IV SOLN
400.0000 mg/m2 | Freq: Once | INTRAVENOUS | Status: AC
  Administered 2018-01-29: 764 mg via INTRAVENOUS
  Filled 2018-01-29: qty 38.2

## 2018-01-29 NOTE — Progress Notes (Signed)
Pt CBG 255 today, pt states he forgot to take insulin this am,. Will take when gets home. No s/s of hyperglycemia.

## 2018-01-29 NOTE — Patient Instructions (Signed)
[facility] Discharge Instructions for Patients Receiving Chemotherapy  Today you received the following chemotherapy agents:  Irinotecan, Leucovorin, and 5FU.   To help prevent nausea and vomiting after your treatment, we encourage you to take your nausea medication as directed.   If you develop nausea and vomiting that is not controlled by your nausea medication, call the clinic.   BELOW ARE SYMPTOMS THAT SHOULD BE REPORTED IMMEDIATELY:  *FEVER GREATER THAN 100.5 F  *CHILLS WITH OR WITHOUT FEVER  NAUSEA AND VOMITING THAT IS NOT CONTROLLED WITH YOUR NAUSEA MEDICATION  *UNUSUAL SHORTNESS OF BREATH  *UNUSUAL BRUISING OR BLEEDING  TENDERNESS IN MOUTH AND THROAT WITH OR WITHOUT PRESENCE OF ULCERS  *URINARY PROBLEMS  *BOWEL PROBLEMS  UNUSUAL RASH Items with * indicate a potential emergency and should be followed up as soon as possible.  Feel free to call the clinic should you have any questions or concerns. The clinic phone number is [phone].  Please show the CHEMO ALERT CARD at check-in to the Emergency Department and triage nurse.   

## 2018-01-29 NOTE — Telephone Encounter (Signed)
appts already scheduled per 5/8 los - central radiology to contact patient with ct scan .

## 2018-01-29 NOTE — Progress Notes (Signed)
[place] OFFICE PROGRESS NOTE   Diagnosis: Gastric cancer  INTERVAL HISTORY:   Robert Beck completed another cycle of FOLFIRI on 01/08/2018.  He reports less nausea and diarrhea following this cycle of chemotherapy.  He was seen on 01/14/2018 with soreness in the right lower neck.  We had a low clinical suspicion for a Port-A-Cath related thrombosis.  The soreness has improved, but he continues to have a fullness in the right lower neck.  Objective:  Vital signs in last 24 hours:  Blood pressure (!) 146/98, pulse 85, temperature 97.6 F (36.4 C), temperature source Oral, resp. rate 17, height '5\' 6"'  (1.676 m), weight 164 lb 14.4 oz (74.8 kg), SpO2 99 %.    HEENT: No thrush or ulcers.  Firm fullness on deep palpation deep to the right jugular vein.  No palpable cord.  No erythema.  Nontender. Lymphatics: No high cervical or supraclavicular nodes Resp: Lungs clear bilaterally Cardio: Regular rate and rhythm GI: No hepatomegaly Vascular: No leg edema    Portacath/PICC-without erythema  Lab Results:  Lab Results  Component Value Date   WBC 5.9 01/29/2018   HGB 12.1 (L) 01/29/2018   HCT 36.2 (L) 01/29/2018   MCV 80.3 01/29/2018   PLT 285 01/29/2018   NEUTROABS 3.8 01/29/2018    CMP     Component Value Date/Time   NA 142 01/29/2018 0800   NA 140 09/25/2017 0855   K 4.1 01/29/2018 0800   K 3.6 09/25/2017 0855   CL 107 01/29/2018 0800   CO2 26 01/29/2018 0800   CO2 22 09/25/2017 0855   GLUCOSE 255 (H) 01/29/2018 0800   GLUCOSE 211 (H) 09/25/2017 0855   BUN 10 01/29/2018 0800   BUN 10.1 09/25/2017 0855   CREATININE 0.90 01/29/2018 0800   CREATININE 0.9 09/25/2017 0855   CALCIUM 10.1 01/29/2018 0800   CALCIUM 9.8 09/25/2017 0855   PROT 7.3 01/29/2018 0800   PROT 7.2 09/25/2017 0855   ALBUMIN 3.9 01/29/2018 0800   ALBUMIN 4.0 09/25/2017 0855   AST 12 01/29/2018 0800   AST 22 09/25/2017 0855   ALT 16 01/29/2018 0800   ALT 33 09/25/2017 0855   ALKPHOS 115 01/29/2018 0800   ALKPHOS 118 09/25/2017 0855   BILITOT 0.3 01/29/2018 0800   BILITOT 0.39 09/25/2017 0855   GFRNONAA >60 01/29/2018 0800   GFRAA >60 01/29/2018 0800    Lab Results  Component Value Date   CEA1 3.83 06/27/2016     Medications: I have reviewed the patient's current medications.   Assessment/Plan: 1. 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 Decreased gastric cardia wall thickening.  Cycle 7 FOLFOX 07/25/2016  Cycle 8 FOLFOX 08/08/2016  Cycle 9 FOLFOX 08/22/2016 (oxaliplatin held due to neuropathy)  Cycle 10 FOLFOX 09/05/2016 (oxaliplatin held)  Cycle 11 FOLFOX 09/26/2016 (oxaliplatin held)  Cycle 12 FOLFOX 10/10/2016 (oxaliplatin held)  CT 10/18/2016-mild enlargement of mediastinal lymph nodes, no other evidence of disease progression, stable thickening at  the GE junction and stable gastrohepatic lymphadenopathy  Cycle 13 FOLFOX 10/24/2016 (oxaliplatin held)  Maintenance Xeloda 7 days on/7 days off beginning 11/17/2016  Restaging chest CT 01/22/2017-interval progression of metastatic lymphadenopathy in the mediastinum and gastrohepatic ligament.  Salvage therapy with FOLFIRI initiated 02/20/2017  Cycle 5 FOLFIRI 04/17/2017  Restaging chest  CT 04/29/2017-mild improvement in mediastinal adenopathy. No significant change in adenopathy in the gastrohepatic ligament. No evidence of disease progression.  Cycle 6 FOLFIRI 05/01/2017  Cycle 7 FOLFIRI 05/15/2017  Cycle 8 FOLFIRI 05/29/2017  Cycle 9 FOLFIRI 06/19/2017  Cycle 10 FOLFIRI 07/03/2017  Cycle 11 FOLFIRI 07/17/2017  CT chest 07/30/2017-mild decrease in mediastinal and gastrohepatic ligament lymphadenopathy, no evidence of progressive disease  Cycle 12 FOLFIRI 07/31/2017  Cycle 13 FOLFIRI 08/14/2017  Cycle 14 FOLFIRI 09/04/2017  Cycle 15 FOLFIRI 09/25/2017  Cycle 16 FOLFIRI 10/16/2017  Cycle 17 FOLFIRI 11/06/2017  Cycle 18 FOLFIRI 11/27/2017  cycle 19 FOLFIRI 12/18/2017  CT 01/06/2018-stable neck/mediastinal nodes, slight enlargement of gastrohepatic ligament node stable periportal and portacaval nodes  Cycle 20 FOLFIRI 01/08/2018  Cycle 21 FOLFIRI 01/29/2018  2. Anemia secondary to #1-improved  3. Diabetes  4. Gout  5. Hypertension  6. Hyperlipidemia  7. History of a T7 compression fracture  8. Pain secondary to the gastric mass and mediastinal lymphadenopathy-resolved  9. Port-A-Cath placement 04/20/2016  10. 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 1 mm obstructing stone distal right ureter/right ureteral vesicle junction on CT 09/13/2016.  13. 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 Hypercalcemia 12/19/2016-asymptomatic  15. Nausea following FOLFIRI-emend added beginning with cycle 3, trial of Reglan beginning12/08/2017, persistent   Disposition: Robert Beck appears unchanged.  He is tolerating the FOLFIRI well.  He will complete another cycle today.  He has persistent fullness in the right lower neck.  We will schedule a  neck CT prior to the next cycle of chemotherapy to look for evidence of progressive lymphadenopathy or a venous thrombosis.  He will contact us in the interim for progressive swelling in the right neck.  He will return for office visit and the next cycle of chemotherapy in 3 weeks.  Betsy Coder, MD  01/29/2018  7:10 PM

## 2018-01-31 ENCOUNTER — Inpatient Hospital Stay: Payer: BLUE CROSS/BLUE SHIELD

## 2018-01-31 VITALS — BP 140/90 | HR 80 | Temp 98.0°F | Resp 18

## 2018-01-31 DIAGNOSIS — C160 Malignant neoplasm of cardia: Secondary | ICD-10-CM

## 2018-01-31 MED ORDER — HEPARIN SOD (PORK) LOCK FLUSH 100 UNIT/ML IV SOLN
500.0000 [IU] | Freq: Once | INTRAVENOUS | Status: AC | PRN
  Administered 2018-01-31: 500 [IU]
  Filled 2018-01-31: qty 5

## 2018-01-31 MED ORDER — SODIUM CHLORIDE 0.9% FLUSH
10.0000 mL | INTRAVENOUS | Status: DC | PRN
  Administered 2018-01-31: 10 mL
  Filled 2018-01-31: qty 10

## 2018-02-06 ENCOUNTER — Ambulatory Visit (HOSPITAL_COMMUNITY)
Admission: RE | Admit: 2018-02-06 | Discharge: 2018-02-06 | Disposition: A | Discharge status: Home / Self Care | Payer: BLUE CROSS/BLUE SHIELD | Source: Ambulatory Visit | Attending: Oncology | Admitting: Oncology

## 2018-02-06 ENCOUNTER — Encounter (HOSPITAL_COMMUNITY): Payer: Self-pay

## 2018-02-06 DIAGNOSIS — L040 Acute lymphadenitis of face, head and neck: Secondary | ICD-10-CM | POA: Insufficient documentation

## 2018-02-06 DIAGNOSIS — C160 Malignant neoplasm of cardia: Secondary | ICD-10-CM | POA: Diagnosis not present

## 2018-02-06 MED ORDER — IOHEXOL 300 MG/ML  SOLN
75.0000 mL | Freq: Once | INTRAMUSCULAR | Status: AC | PRN
  Administered 2018-02-06: 75 mL via INTRAVENOUS

## 2018-02-07 ENCOUNTER — Telehealth: Payer: Self-pay | Admitting: *Deleted

## 2018-02-07 NOTE — Telephone Encounter (Signed)
-----   Message from Ladell Pier, MD sent at 02/07/2018  4:21 PM EDT ----- Please call patient, CT shows an enlarged right lower neck lymph node that is larger than November 2018, but stable compared to the chest CT in April thousand 19.  No blood clot or abscess is seen.  Follow-up as scheduled, we will consider radiation or changing treatment if this lymph node continues to bother him

## 2018-02-11 ENCOUNTER — Other Ambulatory Visit: Payer: Self-pay | Admitting: Emergency Medicine

## 2018-02-11 DIAGNOSIS — C160 Malignant neoplasm of cardia: Secondary | ICD-10-CM

## 2018-02-11 MED ORDER — ONDANSETRON HCL 8 MG PO TABS: 8.0000 mg | ORAL_TABLET | Freq: Three times a day (TID) | ORAL | 1 refills | Status: DC | PRN

## 2018-02-17 ENCOUNTER — Other Ambulatory Visit: Payer: Self-pay | Admitting: Oncology

## 2018-02-19 ENCOUNTER — Encounter: Payer: Self-pay | Admitting: Nurse Practitioner

## 2018-02-19 ENCOUNTER — Inpatient Hospital Stay: Payer: BLUE CROSS/BLUE SHIELD

## 2018-02-19 ENCOUNTER — Telehealth: Payer: Self-pay | Admitting: Nurse Practitioner

## 2018-02-19 ENCOUNTER — Inpatient Hospital Stay (HOSPITAL_BASED_OUTPATIENT_CLINIC_OR_DEPARTMENT_OTHER): Payer: BLUE CROSS/BLUE SHIELD | Admitting: Nurse Practitioner

## 2018-02-19 VITALS — BP 146/89 | HR 101 | Temp 97.9°F | Resp 20 | Ht 66.0 in | Wt 165.5 lb

## 2018-02-19 DIAGNOSIS — C160 Malignant neoplasm of cardia: Secondary | ICD-10-CM

## 2018-02-19 DIAGNOSIS — R221 Localized swelling, mass and lump, neck: Secondary | ICD-10-CM

## 2018-02-19 DIAGNOSIS — C770 Secondary and unspecified malignant neoplasm of lymph nodes of head, face and neck: Secondary | ICD-10-CM

## 2018-02-19 DIAGNOSIS — Z95828 Presence of other vascular implants and grafts: Secondary | ICD-10-CM

## 2018-02-19 DIAGNOSIS — I10 Essential (primary) hypertension: Secondary | ICD-10-CM | POA: Diagnosis not present

## 2018-02-19 DIAGNOSIS — E119 Type 2 diabetes mellitus without complications: Secondary | ICD-10-CM

## 2018-02-19 DIAGNOSIS — G620 Drug-induced polyneuropathy: Secondary | ICD-10-CM | POA: Diagnosis not present

## 2018-02-19 DIAGNOSIS — T451X5A Adverse effect of antineoplastic and immunosuppressive drugs, initial encounter: Secondary | ICD-10-CM

## 2018-02-19 LAB — CMP (CANCER CENTER ONLY)
ALBUMIN: 4 g/dL (ref 3.5–5.0)
ALT: 15 U/L (ref 0–55)
ANION GAP: 12 — AB (ref 3–11)
AST: 15 U/L (ref 5–34)
Alkaline Phosphatase: 93 U/L (ref 40–150)
BUN: 11 mg/dL (ref 7–26)
CO2: 24 mmol/L (ref 22–29)
CREATININE: 0.84 mg/dL (ref 0.70–1.30)
Calcium: 9.9 mg/dL (ref 8.4–10.4)
Chloride: 104 mmol/L (ref 98–109)
GFR, Estimated: >60 mL/min (ref >60)
Glucose, Bld: 115 mg/dL (ref 70–140)
Potassium: 3.5 mmol/L (ref 3.5–5.1)
SODIUM: 140 mmol/L (ref 136–145)
TOTAL PROTEIN: 7.6 g/dL (ref 6.4–8.3)
Total Bilirubin: 0.5 mg/dL (ref 0.2–1.2)

## 2018-02-19 LAB — CBC WITH DIFFERENTIAL (CANCER CENTER ONLY)
BASOS ABS: 0 10*3/uL (ref 0.0–0.1)
Basophils Relative: 0 %
EOS ABS: 0.1 10*3/uL (ref 0.0–0.5)
EOS PCT: 1 %
HCT: 37.6 % — ABNORMAL LOW (ref 38.4–49.9)
Hemoglobin: 12.3 g/dL — ABNORMAL LOW (ref 13.0–17.1)
Lymphocytes Relative: 15 %
Lymphs Abs: 0.9 10*3/uL (ref 0.9–3.3)
MCH: 26.5 pg — AB (ref 27.2–33.4)
MCHC: 32.7 g/dL (ref 32.0–36.0)
MCV: 80.9 fL (ref 79.3–98.0)
Monocytes Absolute: 1.1 10*3/uL — ABNORMAL HIGH (ref 0.1–0.9)
Monocytes Relative: 18 %
Neutro Abs: 4 10*3/uL (ref 1.5–6.5)
Neutrophils Relative %: 66 %
Platelet Count: 235 10*3/uL (ref 140–400)
RBC: 4.65 MIL/uL (ref 4.20–5.82)
RDW: 15.1 % — AB (ref 11.0–14.6)
WBC: 6.1 10*3/uL (ref 4.0–10.3)

## 2018-02-19 MED ORDER — POTASSIUM CHLORIDE CRYS ER 10 MEQ PO TBCR: 10.0000 meq | EXTENDED_RELEASE_TABLET | Freq: Two times a day (BID) | ORAL | 1 refills | Status: DC

## 2018-02-19 MED ORDER — SODIUM CHLORIDE 0.9% FLUSH
10.0000 mL | INTRAVENOUS | Status: DC | PRN
  Administered 2018-02-19: 10 mL via INTRAVENOUS
  Filled 2018-02-19: qty 10

## 2018-02-19 MED ORDER — DEXAMETHASONE 4 MG PO TABS: ORAL_TABLET | ORAL | 0 refills | Status: DC

## 2018-02-19 MED ORDER — HEPARIN SOD (PORK) LOCK FLUSH 100 UNIT/ML IV SOLN
500.0000 [IU] | Freq: Once | INTRAVENOUS | Status: DC | PRN
  Filled 2018-02-19: qty 5

## 2018-02-19 NOTE — Telephone Encounter (Signed)
Scheduled appt per 5/29 los - unable to schedule appt for 6/5 due to capped day - logged - will contact patient when appt is scheduled.

## 2018-02-19 NOTE — Progress Notes (Addendum)
[facility] OFFICE PROGRESS NOTE   Diagnosis: Gastric cancer  INTERVAL HISTORY:   Robert Beck returns as scheduled.  He completed another cycle of FOLFIRI 01/29/2018.  He had some nausea with a single episode of vomiting.  He had a single mouth sore which has resolved.  He has intermittent diarrhea relieved with Imodium.  He continues to note soreness at the right neck and occasional mild pain with swallowing.  He has persistent neuropathy symptoms in the feet more so than the hands.  Objective:  Vital signs in last 24 hours:  Blood pressure (!) 146/89, pulse (!) 101, temperature 97.9 F (36.6 C), temperature source Oral, resp. rate 20, height '5\' 6"'  (1.676 m), weight 165 lb 8 oz (75.1 kg), SpO2 98 %.    HEENT: No thrush or ulcers. Lymphatics: Palpable right low neck lymph node mass.  No palpable axillary lymph nodes. Resp: Lungs clear bilaterally. Cardio: Regular rate and rhythm. GI: Abdomen soft and nontender.  No hepatomegaly. Vascular: No leg edema. Port-A-Cath without erythema.  Lab Results:  Lab Results  Component Value Date   WBC 6.1 02/19/2018   HGB 12.3 (L) 02/19/2018   HCT 37.6 (L) 02/19/2018   MCV 80.9 02/19/2018   PLT 235 02/19/2018   NEUTROABS 4.0 02/19/2018    Imaging:  No results found.  Medications: I have reviewed the patient's current medications.  Assessment/Plan: 1. 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 Decreased gastric cardia wall thickening.  Cycle 7 FOLFOX 07/25/2016  Cycle 8 FOLFOX 08/08/2016  Cycle 9 FOLFOX 08/22/2016 (oxaliplatin held due to neuropathy)  Cycle 10 FOLFOX 09/05/2016 (oxaliplatin held)  Cycle 11 FOLFOX 09/26/2016 (oxaliplatin held)  Cycle 12 FOLFOX 10/10/2016 (oxaliplatin held)  CT 10/18/2016-mild enlargement of mediastinal lymph nodes, no other evidence of disease progression, stable thickening at the GE junction and stable gastrohepatic lymphadenopathy  Cycle 13 FOLFOX 10/24/2016 (oxaliplatin held)  Maintenance Xeloda 7 days on/7 days off beginning 11/17/2016  Restaging chest CT 01/22/2017-interval progression of metastatic lymphadenopathy in the mediastinum and gastrohepatic ligament.  Salvage therapy with FOLFIRI initiated 02/20/2017  Cycle 5 FOLFIRI 04/17/2017  Restaging chest CT 04/29/2017-mild improvement in mediastinal adenopathy. No significant change in adenopathy in the gastrohepatic ligament. No evidence of disease progression.  Cycle 6 FOLFIRI 05/01/2017  Cycle 7 FOLFIRI 05/15/2017  Cycle 8 FOLFIRI 05/29/2017  Cycle 9 FOLFIRI 06/19/2017  Cycle 10 FOLFIRI 07/03/2017  Cycle 11 FOLFIRI 07/17/2017  CT chest 07/30/2017-mild decrease in mediastinal and gastrohepatic ligament lymphadenopathy, no evidence of progressive disease  Cycle 12 FOLFIRI 07/31/2017  Cycle 13 FOLFIRI 08/14/2017  Cycle 14 FOLFIRI 09/04/2017  Cycle 15 FOLFIRI 09/25/2017  Cycle 16 FOLFIRI 10/16/2017  Cycle 17 FOLFIRI 11/06/2017  Cycle 18 FOLFIRI 11/27/2017  cycle 19 FOLFIRI 12/18/2017  CT 01/06/2018-stable neck/mediastinal nodes, slight enlargement of gastrohepatic ligament node stable periportal and portacaval nodes  Cycle 20 FOLFIRI 01/08/2018  Cycle 21 FOLFIRI 01/29/2018  CT neck  02/06/2018- 4 cm necrotic right level III/IV  lymph node similar to the chest CT 01/06/2018, considerably larger than on 07/30/2017.  Unchanged adjacent right level IV/supraclavicular lymph node and partially visualized mediastinal lymph nodes.  2. Anemia secondary to #1-improved  3. Diabetes  4. Gout  5. Hypertension  6. Hyperlipidemia  7. History of a T7 compression fracture  8. Pain secondary to the gastric mass and mediastinal lymphadenopathy-resolved  9. Port-A-Cath placement 04/20/2016  10. 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 1 mm obstructing stone distal right ureter/right ureteral vesicle junction on CT 09/13/2016.  13. 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 Hypercalcemia 12/19/2016-asymptomatic  15. Nausea following FOLFIRI-emend added beginning with cycle 3, trial of Reglan beginning12/08/2017, persistent    Disposition: Robert Beck appears stable.  He has completed 21 cycles of FOLFIRI.  Recent neck CT shows progression of adenopathy.  Dr. Benay Spice recommends discontinuation of FOLFIRI and initiation of Taxol/ramucirumab.  We reviewed potential toxicities associated with Taxol including allergic reaction, alopecia, neuropathy.  He understands the rationale for the dexamethasone premedication.  We reviewed potential toxicities associate with ramucirumab including hypertension, bleeding, bowel perforation, increased risk of strokes and blood clots, delayed wound healing.  He agrees to proceed.  We are referring him for a biopsy of the right neck adenopathy and will request this be sent for foundation 1 and HER-2 testing.  He will return for lab and Taxol/ramucirumab on 02/26/2018.  We will see him in follow-up prior to Taxol on 03/05/2018.  He will  contact the office in the interim with any problems.  Patient seen with Dr. Benay Spice.  CT images reviewed on the computer with Mr. Civil and his wife.  25 minutes were spent face-to-face at today's visit with the majority of that time involved in counseling/coordination of care.    Ned Card ANP/GNP-BC   02/19/2018  9:22 AM This was a shared visit with Ned Card.  Mr. Hunzeker was interviewed and examined.  We reviewed the restaging CT images with him.  There is clinical and CT evidence of disease progression on FOLFIRI.  We decided to discontinue FOLFIRI.  I discussed treatment options with Mr. Heatwole and his wife.  He agrees to begin Taxol/ramucirumab.  We reviewed the potential toxicities associated with this regimen including the chance for progressive neuropathy.  We will refer him for a repeat biopsy of the right supraclavicular mass in order to obtain tissue for Foundation 1, HER-2, and PD1 testing.  Julieanne Manson, MD

## 2018-02-20 NOTE — Progress Notes (Signed)
DISCONTINUE OFF PATHWAY REGIMEN - Gastroesophageal   [id number]:FOLFIRI (q14d)  **2 cycles per order sheet**:   A cycle is every 14 days:     Irinotecan      Leucovorin      5-Fluorouracil      5-Fluorouracil   **Always confirm dose/schedule in your pharmacy ordering system**    REASON: Disease Progression PRIOR TREATMENT: Off Pathway: FOLFIRI (q14d)  **2 cycles per order sheet** TREATMENT RESPONSE: Progressive Disease (PD)  START OFF PATHWAY REGIMEN - Gastroesophageal   [id number]:Ramucirumab 8 mg/kg Days 1, 15 + Paclitaxel 80 mg/m2 Days 1, 8, 15 q28 Days:   A cycle is every 28 days:     Ramucirumab      Paclitaxel   **Always confirm dose/schedule in your pharmacy ordering system**    Patient Characteristics: Distant Metastases (cM1/pM1) / Locally Recurrent Disease, Adenocarcinoma - Esophageal, GE Junction, and Gastric, Third Line and Beyond, MSS/pMMR or MSI Unknown, PD?L1 Expression Negative/Unknown or Prior PD-1/PD-L1 Inhibitor Histology: Adenocarcinoma Disease Classification: Gastric Therapeutic Status: Distant Metastases (No Additional Staging) Line of Therapy: Third Engineer, civil (consulting) Status: Unknown PD-L1 Expression Status: Awaiting Test Results Prior Immunotherapy Status: No Prior PD-1/PD-L1 Inhibitor Intent of Therapy: Non-Curative / Palliative Intent, Discussed with Patient

## 2018-02-21 ENCOUNTER — Inpatient Hospital Stay: Payer: BLUE CROSS/BLUE SHIELD

## 2018-02-22 ENCOUNTER — Encounter: Payer: Self-pay | Admitting: Nurse Practitioner

## 2018-02-23 ENCOUNTER — Other Ambulatory Visit: Payer: Self-pay | Admitting: Oncology

## 2018-02-24 ENCOUNTER — Encounter: Payer: Self-pay | Admitting: Oncology

## 2018-02-26 ENCOUNTER — Telehealth: Payer: Self-pay | Admitting: Nurse Practitioner

## 2018-02-26 ENCOUNTER — Inpatient Hospital Stay: Payer: BLUE CROSS/BLUE SHIELD | Attending: Oncology

## 2018-02-26 ENCOUNTER — Inpatient Hospital Stay (HOSPITAL_BASED_OUTPATIENT_CLINIC_OR_DEPARTMENT_OTHER): Payer: BLUE CROSS/BLUE SHIELD | Admitting: Nurse Practitioner

## 2018-02-26 ENCOUNTER — Inpatient Hospital Stay: Payer: BLUE CROSS/BLUE SHIELD

## 2018-02-26 VITALS — BP 131/95 | HR 107 | Temp 98.2°F | Resp 18

## 2018-02-26 DIAGNOSIS — R21 Rash and other nonspecific skin eruption: Secondary | ICD-10-CM | POA: Insufficient documentation

## 2018-02-26 DIAGNOSIS — R531 Weakness: Secondary | ICD-10-CM | POA: Diagnosis not present

## 2018-02-26 DIAGNOSIS — R110 Nausea: Secondary | ICD-10-CM | POA: Diagnosis not present

## 2018-02-26 DIAGNOSIS — I10 Essential (primary) hypertension: Secondary | ICD-10-CM | POA: Insufficient documentation

## 2018-02-26 DIAGNOSIS — E1165 Type 2 diabetes mellitus with hyperglycemia: Secondary | ICD-10-CM

## 2018-02-26 DIAGNOSIS — Z5111 Encounter for antineoplastic chemotherapy: Secondary | ICD-10-CM | POA: Insufficient documentation

## 2018-02-26 DIAGNOSIS — C160 Malignant neoplasm of cardia: Secondary | ICD-10-CM | POA: Insufficient documentation

## 2018-02-26 DIAGNOSIS — C770 Secondary and unspecified malignant neoplasm of lymph nodes of head, face and neck: Secondary | ICD-10-CM

## 2018-02-26 LAB — CBC WITH DIFFERENTIAL (CANCER CENTER ONLY)
Basophils Absolute: 0 10*3/uL (ref 0.0–0.1)
Basophils Relative: 0 %
Eosinophils Absolute: 0 10*3/uL (ref 0.0–0.5)
Eosinophils Relative: 0 %
HEMATOCRIT: 36 % — AB (ref 38.4–49.9)
HEMOGLOBIN: 11.9 g/dL — AB (ref 13.0–17.1)
LYMPHS ABS: 0.7 10*3/uL — AB (ref 0.9–3.3)
LYMPHS PCT: 9 %
MCH: 26.5 pg — AB (ref 27.2–33.4)
MCHC: 33.1 g/dL (ref 32.0–36.0)
MCV: 80.2 fL (ref 79.3–98.0)
Monocytes Absolute: 0.1 10*3/uL (ref 0.1–0.9)
Monocytes Relative: 2 %
NEUTROS ABS: 7 10*3/uL — AB (ref 1.5–6.5)
NEUTROS PCT: 89 %
PLATELETS: 259 10*3/uL (ref 140–400)
RBC: 4.49 MIL/uL (ref 4.20–5.82)
RDW: 14.8 % — ABNORMAL HIGH (ref 11.0–14.6)
WBC Count: 7.8 10*3/uL (ref 4.0–10.3)

## 2018-02-26 LAB — CMP (CANCER CENTER ONLY)
ALK PHOS: 120 U/L (ref 40–150)
ALT: 25 U/L (ref 0–55)
AST: 19 U/L (ref 5–34)
Albumin: 3.9 g/dL (ref 3.5–5.0)
Anion gap: 17 — ABNORMAL HIGH (ref 3–11)
BILIRUBIN TOTAL: 1 mg/dL (ref 0.2–1.2)
BUN: 16 mg/dL (ref 7–26)
CALCIUM: 9.9 mg/dL (ref 8.4–10.4)
CHLORIDE: 102 mmol/L (ref 98–109)
CO2: 19 mmol/L — ABNORMAL LOW (ref 22–29)
CREATININE: 1.07 mg/dL (ref 0.70–1.30)
Glucose, Bld: 430 mg/dL — ABNORMAL HIGH (ref 70–140)
Potassium: 4.3 mmol/L (ref 3.5–5.1)
Sodium: 138 mmol/L (ref 136–145)
Total Protein: 8.1 g/dL (ref 6.4–8.3)

## 2018-02-26 MED ORDER — DEXAMETHASONE 4 MG PO TABS: ORAL_TABLET | ORAL | 0 refills | Status: DC

## 2018-02-26 MED ORDER — DOXYCYCLINE HYCLATE 100 MG PO TABS: 100.0000 mg | ORAL_TABLET | Freq: Two times a day (BID) | ORAL | 0 refills | Status: AC

## 2018-02-26 MED ORDER — HEPARIN SOD (PORK) LOCK FLUSH 100 UNIT/ML IV SOLN
500.0000 [IU] | Freq: Once | INTRAVENOUS | Status: AC | PRN
  Administered 2018-02-26: 500 [IU]
  Filled 2018-02-26: qty 5

## 2018-02-26 MED ORDER — SODIUM CHLORIDE 0.9% FLUSH
10.0000 mL | INTRAVENOUS | Status: DC | PRN
  Administered 2018-02-26: 10 mL
  Filled 2018-02-26: qty 10

## 2018-02-26 NOTE — Telephone Encounter (Signed)
No 6/5 los.  

## 2018-02-26 NOTE — Patient Instructions (Signed)
[facility] Discharge Instructions for Patients Receiving Chemotherapy  Today you received the following chemotherapy agents Taxol,  Cyramza.   To help prevent nausea and vomiting after your treatment, we encourage you to take your nausea medication as directed.   If you develop nausea and vomiting that is not controlled by your nausea medication, call the clinic.   BELOW ARE SYMPTOMS THAT SHOULD BE REPORTED IMMEDIATELY:  *FEVER GREATER THAN 100.5 F  *CHILLS WITH OR WITHOUT FEVER  NAUSEA AND VOMITING THAT IS NOT CONTROLLED WITH YOUR NAUSEA MEDICATION  *UNUSUAL SHORTNESS OF BREATH  *UNUSUAL BRUISING OR BLEEDING  TENDERNESS IN MOUTH AND THROAT WITH OR WITHOUT PRESENCE OF ULCERS  *URINARY PROBLEMS  *BOWEL PROBLEMS  UNUSUAL RASH Items with * indicate a potential emergency and should be followed up as soon as possible.  Feel free to call the clinic should you have any questions or concerns. The clinic phone number is [phone].  Please show the [facility] at check-in to the Emergency Department and triage nurse.  Paclitaxel injection (Taxol) What is this medicine? PACLITAXEL (PAK li TAX el) is a chemotherapy drug. It targets fast dividing cells, like cancer cells, and causes these cells to die. This medicine is used to treat ovarian cancer, breast cancer, and other cancers. This medicine may be used for other purposes; ask your health care provider or pharmacist if you have questions. COMMON BRAND NAME(S): Onxol, Taxol What should I tell my health care provider before I take this medicine? They need to know if you have any of these conditions: -blood disorders -irregular heartbeat -infection (especially a virus infection such as chickenpox, cold sores, or herpes) -liver disease -previous or ongoing radiation therapy -an unusual or allergic reaction to paclitaxel, alcohol, polyoxyethylated castor oil, other chemotherapy agents, other medicines, foods,  dyes, or preservatives -pregnant or trying to get pregnant -breast-feeding How should I use this medicine? This drug is given as an infusion into a vein. It is administered in a hospital or clinic by a specially trained health care professional. Talk to your pediatrician regarding the use of this medicine in children. Special care may be needed. Overdosage: If you think you have taken too much of this medicine contact a poison control center or emergency room at once. NOTE: This medicine is only for you. Do not share this medicine with others. What if I miss a dose? It is important not to miss your dose. Call your doctor or health care professional if you are unable to keep an appointment. What may interact with this medicine? Do not take this medicine with any of the following medications: -disulfiram -metronidazole This medicine may also interact with the following medications: -cyclosporine -diazepam -ketoconazole -medicines to increase blood counts like filgrastim, pegfilgrastim, sargramostim -other chemotherapy drugs like cisplatin, doxorubicin, epirubicin, etoposide, teniposide, vincristine -quinidine -testosterone -vaccines -verapamil Talk to your doctor or health care professional before taking any of these medicines: -acetaminophen -aspirin -ibuprofen -ketoprofen -naproxen This list may not describe all possible interactions. Give your health care provider a list of all the medicines, herbs, non-prescription drugs, or dietary supplements you use. Also tell them if you smoke, drink alcohol, or use illegal drugs. Some items may interact with your medicine. What should I watch for while using this medicine? Your condition will be monitored carefully while you are receiving this medicine. You will need important blood work done while you are taking this medicine. This medicine can cause serious allergic reactions. To reduce your risk you  will need to take other medicine(s)  before treatment with this medicine. If you experience allergic reactions like skin rash, itching or hives, swelling of the face, lips, or tongue, tell your doctor or health care professional right away. In some cases, you may be given additional medicines to help with side effects. Follow all directions for their use. This drug may make you feel generally unwell. This is not uncommon, as chemotherapy can affect healthy cells as well as cancer cells. Report any side effects. Continue your course of treatment even though you feel ill unless your doctor tells you to stop. Call your doctor or health care professional for advice if you get a fever, chills or sore throat, or other symptoms of a cold or flu. Do not treat yourself. This drug decreases your body's ability to fight infections. Try to avoid being around people who are sick. This medicine may increase your risk to bruise or bleed. Call your doctor or health care professional if you notice any unusual bleeding. Be careful brushing and flossing your teeth or using a toothpick because you may get an infection or bleed more easily. If you have any dental work done, tell your dentist you are receiving this medicine. Avoid taking products that contain aspirin, acetaminophen, ibuprofen, naproxen, or ketoprofen unless instructed by your doctor. These medicines may hide a fever. Do not become pregnant while taking this medicine. Women should inform their doctor if they wish to become pregnant or think they might be pregnant. There is a potential for serious side effects to an unborn child. Talk to your health care professional or pharmacist for more information. Do not breast-feed an infant while taking this medicine. Men are advised not to father a child while receiving this medicine. This product may contain alcohol. Ask your pharmacist or healthcare provider if this medicine contains alcohol. Be sure to tell all healthcare providers you are taking this  medicine. Certain medicines, like metronidazole and disulfiram, can cause an unpleasant reaction when taken with alcohol. The reaction includes flushing, headache, nausea, vomiting, sweating, and increased thirst. The reaction can last from 30 minutes to several hours. What side effects may I notice from receiving this medicine? Side effects that you should report to your doctor or health care professional as soon as possible: -allergic reactions like skin rash, itching or hives, swelling of the face, lips, or tongue -low blood counts - This drug may decrease the number of white blood cells, red blood cells and platelets. You may be at increased risk for infections and bleeding. -signs of infection - fever or chills, cough, sore throat, pain or difficulty passing urine -signs of decreased platelets or bleeding - bruising, pinpoint red spots on the skin, black, tarry stools, nosebleeds -signs of decreased red blood cells - unusually weak or tired, fainting spells, lightheadedness -breathing problems -chest pain -high or low blood pressure -mouth sores -nausea and vomiting -pain, swelling, redness or irritation at the injection site -pain, tingling, numbness in the hands or feet -slow or irregular heartbeat -swelling of the ankle, feet, hands Side effects that usually do not require medical attention (report to your doctor or health care professional if they continue or are bothersome): -bone pain -complete hair loss including hair on your head, underarms, pubic hair, eyebrows, and eyelashes -changes in the color of fingernails -diarrhea -loosening of the fingernails -loss of appetite -muscle or joint pain -red flush to skin -sweating This list may not describe all possible side effects. Call your doctor for   medical advice about side effects. You may report side effects to FDA at 1-800-FDA-1088. Where should I keep my medicine? This drug is given in a hospital or clinic and will not be  stored at home. NOTE: This sheet is a summary. It may not cover all possible information. If you have questions about this medicine, talk to your doctor, pharmacist, or health care provider.  2018 Elsevier/Gold Standard (2015-07-12 19:58:00)  Ramucirumab injection (Cyramza) What is this medicine? RAMUCIRUMAB (ra mue SIR ue mab) is a monoclonal antibody. It is used to treat stomach cancer, colorectal cancer, or lung cancer. This medicine may be used for other purposes; ask your health care provider or pharmacist if you have questions. COMMON BRAND NAME(S): Cyramza What should I tell my health care provider before I take this medicine? They need to know if you have any of these conditions: -bleeding disorders -blood clots -heart disease, including heart failure, heart attack, or chest pain (angina) -high blood pressure -infection (especially a virus infection such as chickenpox, cold sores, or herpes) -protein in your urine -recent surgery -stroke -an unusual or allergic reaction to ramucirumab, other medicines, foods, dyes, or preservatives -pregnant or trying to get pregnant -breast-feeding How should I use this medicine? This medicine is for infusion into a vein. It is given by a health care professional in a hospital or clinic setting. Talk to your pediatrician regarding the use of this medicine in children. Special care may be needed. Overdosage: If you think you have taken too much of this medicine contact a poison control center or emergency room at once. NOTE: This medicine is only for you. Do not share this medicine with others. What if I miss a dose? It is important not to miss your dose. Call your doctor or health care professional if you are unable to keep an appointment. What may interact with this medicine? Interactions have not been studied. This list may not describe all possible interactions. Give your health care provider a list of all the medicines, herbs,  non-prescription drugs, or dietary supplements you use. Also tell them if you smoke, drink alcohol, or use illegal drugs. Some items may interact with your medicine. What should I watch for while using this medicine? Your condition will be monitored carefully while you are receiving this medicine. You will need to to check your blood pressure and have your blood and urine tested while you are taking this medicine. Your condition will be monitored carefully while you are receiving this medicine. This medicine may increase your risk to bruise or bleed. Call your doctor or health care professional if you notice any unusual bleeding. This medicine may rarely cause 'gastrointestinal perforation' (holes in the stomach, intestines or colon), a serious side effect requiring surgery to repair. This medicine should be started at least 28 days following major surgery and the site of the surgery should be totally healed. Check with your doctor before scheduling dental work or surgery while you are receiving this treatment. Talk to your doctor if you have recently had surgery or if you have a wound that has not healed. Do not become pregnant while taking this medicine or for 3 months after stopping it. Women should inform their doctor if they wish to become pregnant or think they might be pregnant. There is a potential for serious side effects to an unborn child. Talk to your health care professional or pharmacist for more information. What side effects may I notice from receiving this medicine? Side effects that  you should report to your doctor or health care professional as soon as possible: -allergic reactions like skin rash, itching or hives, breathing problems, swelling of the face, lips, or tongue -signs of infection - fever or chills, cough, sore throat -chest pain or chest tightness -confusion -dizziness -feeling faint or lightheaded, falls -severe abdominal pain -severe nausea, vomiting -signs and  symptoms of bleeding such as bloody or black, tarry stools; red or dark-brown urine; spitting up blood or brown material that looks like coffee grounds; red spots on the skin; unusual bruising or bleeding from the eye, gums, or nose -signs and symptoms of a blood clot such as breathing problems; changes in vision; chest pain; severe, sudden headache; pain, swelling, warmth in the leg; trouble speaking; sudden numbness or weakness of the face, arm or leg -symptoms of a stroke: change in mental awareness, inability to talk or move one side of the body -trouble walking, dizziness, loss of balance or coordination Side effects that usually do not require medical attention (report to your doctor or health care professional if they continue or are bothersome): -cold, clammy skin -constipation -diarrhea -headache -nausea, vomiting -stomach pain -unusually slow heartbeat -unusually weak or tired This list may not describe all possible side effects. Call your doctor for medical advice about side effects. You may report side effects to FDA at 1-800-FDA-1088. Where should I keep my medicine? This drug is given in a hospital or clinic and will not be stored at home. NOTE: This sheet is a summary. It may not cover all possible information. If you have questions about this medicine, talk to your doctor, pharmacist, or health care provider.  2018 Elsevier/Gold Standard (2015-10-13 08:20:29)

## 2018-02-26 NOTE — Progress Notes (Signed)
Pt reports fever 02/25/18, however pt did not have thermometer to take temperature. Pt also reports pulling several ticks off, "big ticks, medium ticks, small ticks." Pt has several red places on abdomen where he has pulled them off. Pt reports waking around 0200 02/26/18 drenched in sweat. Melia desk RN will notify Dr. Benay Spice.   Per Dr. Benay Spice, no tx today.

## 2018-02-26 NOTE — Progress Notes (Addendum)
[facility] OFFICE PROGRESS NOTE   Diagnosis: Gastric cancer  INTERVAL HISTORY:   Robert Beck is seen in an unscheduled visit due to several recent tick bites, possible fever.  He is here today to begin cycle 1 Taxol/ramucirumab.  He reports a few recent tick bites.  Last night he had "chills" and felt hot.  He does not have a thermometer.  He felt "flulike".  At 2 AM he woke up very sweaty.  No cough or shortness of breath.  No change in baseline bowel habits.  He continues to have intermittent nausea.  No vomiting.  He reports a progressive rash at the right upper posterior leg.  His wife does not recall removing a tick from this area.  He thinks he may have removed one.  In general he feels weak but better than yesterday.  He took the dexamethasone premedication as prescribed last night.  Objective:  Vital signs in last 24 hours:  Temperature 98.2, heart rate 107, blood pressure 131/95, respirations 18    HEENT: No thrush or ulcers. Resp: Lungs clear bilaterally. Cardio: Regular rate and rhythm. GI: Abdomen soft and nontender.  No hepatomegaly. Vascular: No leg edema. Neuro: Alert and oriented. Skin: Several small areas of erythema at the lower abdomen reported to be tick bites.  Large area of erythema with mild induration at the right upper posterior leg.   Lab Results:  Lab Results  Component Value Date   WBC 7.8 02/26/2018   HGB 11.9 (L) 02/26/2018   HCT 36.0 (L) 02/26/2018   MCV 80.2 02/26/2018   PLT 259 02/26/2018   NEUTROABS 7.0 (H) 02/26/2018    Imaging:  No results found.  Medications: I have reviewed the patient's current medications.  Assessment/Plan: 1. Gastric cancer, gastric cardia mass extendingto the GE junction confirmed on endoscopy 04/11/2016, biopsy confirmed adenocarcinoma  Staging CTs of the chest, abdomen, and pelvis 04/09/2016-gastric cardia mass, mediastinal, right hilar, and right supraclavicular lymphadenopathy  Biopsy of  a right supraclavicular lymph node on 04/20/2016 revealed metastatic adenocarcinoma  FoundationACT 02/20/2017-TP53 alteration identified  Cycle 1 FOLFOX 04/30/2016 (oxaliplatin given 04/30/2016, 5-FU infusion started 05/02/2016)  Cycle 2 FOLFOX 05/14/2016  Cycle 3 FOLFOX 05/30/2016  Cycle 4 FOLFOX 06/13/2016  Cycle 5 FOLFOX 06/27/2016  Cycle 6 FOLFOX 07/11/2016  CTs 07/20/2016-decrease in supraclavicular, mediastinal, and gastrohepatic adenopathy. Decreased gastric cardia wall thickening.  Cycle 7 FOLFOX 07/25/2016  Cycle 8 FOLFOX 08/08/2016  Cycle 9 FOLFOX 08/22/2016 (oxaliplatin held due to neuropathy)  Cycle 10 FOLFOX 09/05/2016 (oxaliplatin held)  Cycle 11 FOLFOX 09/26/2016 (oxaliplatin held)  Cycle 12 FOLFOX 10/10/2016 (oxaliplatin held)  CT 10/18/2016-mild enlargement of mediastinal lymph nodes, no other evidence of disease progression, stable thickening at the GE junction and stable gastrohepatic lymphadenopathy  Cycle 13 FOLFOX 10/24/2016 (oxaliplatin held)  Maintenance Xeloda 7 days on/7 days off beginning 11/17/2016  Restaging chest CT 01/22/2017-interval progression of metastatic lymphadenopathy in the mediastinum and gastrohepatic ligament.  Salvage therapy with FOLFIRI initiated 02/20/2017  Cycle 5 FOLFIRI 04/17/2017  Restaging chest CT 04/29/2017-mild improvement in mediastinal adenopathy. No significant change in adenopathy in the gastrohepatic ligament. No evidence of disease progression.  Cycle 6 FOLFIRI 05/01/2017  Cycle 7 FOLFIRI 05/15/2017  Cycle 8 FOLFIRI 05/29/2017  Cycle 9 FOLFIRI 06/19/2017  Cycle 10 FOLFIRI 07/03/2017  Cycle 11 FOLFIRI 07/17/2017  CT chest 07/30/2017-mild decrease in mediastinal and gastrohepatic ligament lymphadenopathy, no evidence of progressive disease  Cycle 12 FOLFIRI 07/31/2017  Cycle 13 FOLFIRI 08/14/2017  Cycle 14 FOLFIRI 09/04/2017  Cycle 15 FOLFIRI 09/25/2017  Cycle 16 FOLFIRI 10/16/2017  Cycle 17  FOLFIRI 11/06/2017  Cycle 18 FOLFIRI 11/27/2017  cycle 19 FOLFIRI 12/18/2017  CT 01/06/2018-stable neck/mediastinal nodes, slight enlargement of gastrohepatic ligament node stable periportal and portacaval nodes  Cycle 20 FOLFIRI 01/08/2018  Cycle 21 FOLFIRI 01/29/2018  CT neck 02/06/2018- 4 cm necrotic right level III/IV lymph node similar to the chest CT 01/06/2018, considerably larger than on 07/30/2017.  Unchanged adjacent right level IV/supraclavicular lymph node and partially visualized mediastinal lymph nodes.  2. Anemia secondary to #1-improved  3. Diabetes  4. Gout  5. Hypertension  6. Hyperlipidemia  7. History of a T7 compression fracture  8. Pain secondary to the gastric mass and mediastinal lymphadenopathy-resolved  9. Port-A-Cath placement 04/20/2016  10. 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 1 mm obstructing stone distal right ureter/right ureteral vesicle junction on CT 09/13/2016.  13. 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 Hypercalcemia 12/19/2016-asymptomatic  15. Nausea following FOLFIRI-emend added beginning with cycle 3, trial of Reglan beginning12/08/2017, persistent  16.  Erythematous, mildly indurated rash right upper posterior leg in setting of possible recent tick bite.   Disposition: Robert Beck appears stable.  He presents today to begin cycle 1 Taxol/ramucirumab.  Yesterday he developed fever and chills.  He has had several recent tick bites.  On exam today he has an erythematous mildly indurated rash of the right upper posterior leg.  He will begin a 7-day course of doxycycline 100 mg twice daily.  We are holding today's treatment.    We reviewed today's labs.  The glucose level is markedly elevated likely due to the  dexamethasone premedication he took last night.  He will avoid concentrated sweets and continue to monitor closely.  He will return for lab, follow-up and possible Taxol/ramucirumab in 1 week.  He understands to contact the office in the interim with any problems.  We specifically discussed progression of the rash, persistent fever, onset of new symptoms.  Patient seen with Dr. Benay Spice.  25 minutes were spent face-to-face at today's visit with the majority of that time involved in counseling/coordination of care.   Ned Card ANP/GNP-BC   02/26/2018  9:37 AM This was a shared visit with Ned Card.  Robert Beck was interviewed and examined.  He will complete a course of doxycycline for the possible tick related illness.  He will contact us for recurrent fever or new symptoms.  We decided to hold chemotherapy today.  Julieanne Manson, MD

## 2018-02-27 ENCOUNTER — Other Ambulatory Visit: Payer: Self-pay | Admitting: Student

## 2018-02-28 ENCOUNTER — Ambulatory Visit (HOSPITAL_COMMUNITY)
Admission: RE | Admit: 2018-02-28 | Discharge: 2018-02-28 | Disposition: A | Discharge status: Home / Self Care | Payer: BLUE CROSS/BLUE SHIELD | Source: Ambulatory Visit | Attending: Nurse Practitioner | Admitting: Nurse Practitioner

## 2018-02-28 ENCOUNTER — Encounter (HOSPITAL_COMMUNITY): Payer: Self-pay

## 2018-02-28 DIAGNOSIS — I998 Other disorder of circulatory system: Secondary | ICD-10-CM | POA: Insufficient documentation

## 2018-02-28 DIAGNOSIS — C160 Malignant neoplasm of cardia: Secondary | ICD-10-CM | POA: Insufficient documentation

## 2018-02-28 DIAGNOSIS — J449 Chronic obstructive pulmonary disease, unspecified: Secondary | ICD-10-CM | POA: Insufficient documentation

## 2018-02-28 DIAGNOSIS — Z7952 Long term (current) use of systemic steroids: Secondary | ICD-10-CM | POA: Diagnosis not present

## 2018-02-28 DIAGNOSIS — Z7951 Long term (current) use of inhaled steroids: Secondary | ICD-10-CM | POA: Insufficient documentation

## 2018-02-28 DIAGNOSIS — E785 Hyperlipidemia, unspecified: Secondary | ICD-10-CM | POA: Insufficient documentation

## 2018-02-28 DIAGNOSIS — E1140 Type 2 diabetes mellitus with diabetic neuropathy, unspecified: Secondary | ICD-10-CM | POA: Diagnosis not present

## 2018-02-28 DIAGNOSIS — Z794 Long term (current) use of insulin: Secondary | ICD-10-CM | POA: Insufficient documentation

## 2018-02-28 DIAGNOSIS — F329 Major depressive disorder, single episode, unspecified: Secondary | ICD-10-CM | POA: Diagnosis not present

## 2018-02-28 DIAGNOSIS — Z85028 Personal history of other malignant neoplasm of stomach: Secondary | ICD-10-CM | POA: Insufficient documentation

## 2018-02-28 DIAGNOSIS — G2581 Restless legs syndrome: Secondary | ICD-10-CM | POA: Insufficient documentation

## 2018-02-28 DIAGNOSIS — Z79891 Long term (current) use of opiate analgesic: Secondary | ICD-10-CM | POA: Diagnosis not present

## 2018-02-28 DIAGNOSIS — Z792 Long term (current) use of antibiotics: Secondary | ICD-10-CM | POA: Diagnosis not present

## 2018-02-28 DIAGNOSIS — Z9889 Other specified postprocedural states: Secondary | ICD-10-CM | POA: Diagnosis not present

## 2018-02-28 DIAGNOSIS — M1990 Unspecified osteoarthritis, unspecified site: Secondary | ICD-10-CM | POA: Diagnosis not present

## 2018-02-28 DIAGNOSIS — Z79899 Other long term (current) drug therapy: Secondary | ICD-10-CM | POA: Diagnosis not present

## 2018-02-28 DIAGNOSIS — I10 Essential (primary) hypertension: Secondary | ICD-10-CM | POA: Insufficient documentation

## 2018-02-28 HISTORY — DX: Dyspnea, unspecified: R06.00

## 2018-02-28 LAB — CBC
HCT: 39.7 % (ref 39.0–52.0)
HEMOGLOBIN: 12.9 g/dL — AB (ref 13.0–17.0)
MCH: 26.3 pg (ref 26.0–34.0)
MCHC: 32.5 g/dL (ref 30.0–36.0)
MCV: 81 fL (ref 78.0–100.0)
Platelets: 371 10*3/uL (ref 150–400)
RBC: 4.9 MIL/uL (ref 4.22–5.81)
RDW: 14.9 % (ref 11.5–15.5)
WBC: 10.8 10*3/uL — AB (ref 4.0–10.5)

## 2018-02-28 LAB — PROTIME-INR
INR: 0.99
Prothrombin Time: 13 seconds (ref 11.4–15.2)

## 2018-02-28 LAB — APTT: APTT: 33 s (ref 24–36)

## 2018-02-28 LAB — GLUCOSE, CAPILLARY
GLUCOSE-CAPILLARY: 59 mg/dL — AB (ref 65–99)
GLUCOSE-CAPILLARY: 237 mg/dL — AB (ref 65–99)

## 2018-02-28 MED ORDER — FENTANYL CITRATE (PF) 100 MCG/2ML IJ SOLN
INTRAMUSCULAR | Status: AC | PRN
  Administered 2018-02-28 (×2): 50 ug via INTRAVENOUS

## 2018-02-28 MED ORDER — FENTANYL CITRATE (PF) 100 MCG/2ML IJ SOLN
INTRAMUSCULAR | Status: AC
  Filled 2018-02-28: qty 4

## 2018-02-28 MED ORDER — LIDOCAINE-EPINEPHRINE (PF) 2 %-1:200000 IJ SOLN
INTRAMUSCULAR | Status: AC
  Filled 2018-02-28: qty 20

## 2018-02-28 MED ORDER — DEXTROSE 50 % IV SOLN
1.0000 | Freq: Once | INTRAVENOUS | Status: AC
  Administered 2018-02-28: 50 mL via INTRAVENOUS
  Filled 2018-02-28: qty 50

## 2018-02-28 MED ORDER — LIDOCAINE HCL (PF) 1 % IJ SOLN
INTRAMUSCULAR | Status: AC | PRN
  Administered 2018-02-28: 10 mL

## 2018-02-28 MED ORDER — MIDAZOLAM HCL 2 MG/2ML IJ SOLN
INTRAMUSCULAR | Status: AC | PRN
  Administered 2018-02-28 (×2): 1 mg via INTRAVENOUS

## 2018-02-28 MED ORDER — SODIUM CHLORIDE 0.9 % IV SOLN
INTRAVENOUS | Status: DC
  Administered 2018-02-28: 12:00:00 via INTRAVENOUS

## 2018-02-28 MED ORDER — DEXTROSE 5 % IV SOLN
INTRAVENOUS | Status: DC
Start: 2018-02-28 — End: 2018-03-01
  Administered 2018-02-28: 12:00:00 via INTRAVENOUS

## 2018-02-28 MED ORDER — MIDAZOLAM HCL 2 MG/2ML IJ SOLN
INTRAMUSCULAR | Status: AC
  Filled 2018-02-28: qty 4

## 2018-02-28 NOTE — H&P (Addendum)
Referring Physician(s): Sherrill,B  Supervising Physician: Sandi Mariscal  Patient Status:  [facility] OP  Chief Complaint:  "I'm here for a biopsy"  Subjective: Patient familiar to IR service from prior Port-A-Cath placement as well as right supraclavicular lymph node biopsy on 04/20/2016.  He has a history of metastatic gastric cancer, on chemotherapy.  Recent CT scan shows enlarging 4 cm necrotic right level III/IV lymph node and he presents again today for image guided biopsy of this lymph node for Foundation One and Her 2  Immuno.  He currently denies fever, headache, chest pain, cough, back pain, nausea, vomiting or bleeding.  He does have some dyspnea with exertion, occasional epigastric discomfort.  Past Medical History:  Diagnosis Date  . Anemia   . Asthma   . COPD (chronic obstructive pulmonary disease) ([place])   . Depression   . Dyspnea    increased exertion  . gastric ca dx'd 01/2016  . Gout   . Hyperlipidemia   . Hypertension   . Iron deficiency anemia   . Ischemia   . Osteoarthritis   . RLS (restless legs syndrome)   . Type 2 diabetes, controlled, with neuropathy ([place])   . Vitamin B12 deficiency    Past Surgical History:  Procedure Laterality Date  . ANKLE FRACTURE SURGERY    . CIRCUMCISION  2008  . COLONOSCOPY    . IR GENERIC HISTORICAL  04/20/2016   IR FLUORO GUIDE CV LINE RIGHT 04/20/2016 [facility]  . IR GENERIC HISTORICAL  04/20/2016   IR US GUIDE BX ASP/DRAIN 04/20/2016 [facility]  . IR GENERIC HISTORICAL  04/20/2016   IR US GUIDE VASC ACCESS RIGHT 04/20/2016 [facility]  . KNEE SURGERY    . NOSE SURGERY    . TONGUE SURGERY  2007   small growth removed from underneath his tongue, not cancerous.   Marland Kitchen UPPER GASTROINTESTINAL ENDOSCOPY      Allergies: Patient has no known allergies.  Medications: Prior to Admission medications   Medication Sig Start Date End Date Taking? Authorizing Provider  dexamethasone (DECADRON) 4 MG tablet Take 10 mg (2.5 tabs)  at 10 pm the night prior to first chemotherapy 02/26/18  Yes Owens Shark, NP  doxycycline (VIBRA-TABS) 100 MG tablet Take 1 tablet (100 mg total) by mouth 2 (two) times daily. 02/26/18  Yes Owens Shark, NP  ferrous sulfate 325 (65 FE) MG tablet Take 325 mg by mouth 2 (two) times daily with a meal.    Yes [provider]  HYDROcodone-acetaminophen (NORCO/VICODIN) 5-325 MG tablet Take 1 tablet by mouth every 6 (six) hours as needed. 04/17/17  Yes Owens Shark, NP  insulin lispro protamine-lispro (HUMALOG 75/25 MIX) (75-25) 100 UNIT/ML SUSP injection Inject 40 Units into the skin daily.   Yes [provider]  lidocaine-prilocaine (EMLA) cream Apply 1 application topically as needed. Apply 1 hr prior to port access and cover with plastic wrap 04/20/16  Yes Ladell Pier, MD  Loperamide HCl (IMODIUM PO) Take 1 tablet by mouth as needed.   Yes [provider]  metFORMIN (GLUCOPHAGE) 500 MG tablet Take 500 mg by mouth 2 (two) times daily. 08/14/17  Yes [provider]  olmesartan-hydrochlorothiazide (BENICAR HCT) 40-12.5 MG tablet Take 0.5 tablets by mouth daily.    Yes [provider]  ondansetron (ZOFRAN) 8 MG tablet Take 1 tablet (8 mg total) by mouth every 8 (eight) hours as needed for nausea or vomiting. 02/11/18  Yes Owens Shark, NP  oxyCODONE (OXY IR/ROXICODONE) 5 MG immediate release tablet Take 5 mg at bedtime as needed by mouth. 07/04/17  Yes [provider]  potassium chloride (K-DUR,KLOR-CON) 10 MEQ tablet Take 1 tablet (10 mEq total) by mouth 2 (two) times daily. 02/19/18  Yes Owens Shark, NP  rOPINIRole (REQUIP) 1 MG tablet Take 1 mg by mouth at bedtime.    Yes [provider]  levalbuterol (XOPENEX HFA) 45 MCG/ACT inhaler Inhale 2 puffs into the lungs 3 (three) times daily as needed for wheezing.    [provider]  LORazepam (ATIVAN) 0.5 MG tablet Take 1 tablet (0.5 mg total) by mouth every 8 (eight) hours as  needed for anxiety or sleep (nausea). 07/03/17   Ladell Pier, MD  metoCLOPramide (REGLAN) 10 MG tablet Take 1 tablet (10 mg total) by mouth 3 (three) times daily before meals. 09/04/17 12/03/17  Owens Shark, NP  Multiple Vitamin (MULTIVITAMIN WITH MINERALS) TABS tablet Take 1 tablet by mouth daily.    [provider]     Vital Signs: BP 128/90 (BP Location: Right Arm)   Pulse 92   Temp 98.6 F (37 C) (Oral)   Resp 18   SpO2 99%   Physical Exam awake/alert.  Chest clear to auscultation bilaterally.  Clean, intact right chest wall Port-A-Cath.  Heart with regular rate and rhythm.  Abdomen soft, positive bowel sounds, currently nontender.  No pretibial edema bilat; some rt sided neck fullness/tenderness  Imaging: No results found.  Labs:  CBC: Recent Labs    01/29/18 0800 02/19/18 0802 02/26/18 0756 02/28/18 1121  WBC 5.9 6.1 7.8 10.8*  HGB 12.1* 12.3* 11.9* 12.9*  HCT 36.2* 37.6* 36.0* 39.7  PLT 285 235 259 371    COAGS: No results for input(s): INR, APTT in the last 8760 hours.  BMP: Recent Labs    01/08/18 0803 01/29/18 0800 02/19/18 0802 02/26/18 0756  NA 141 142 140 138  K 3.8 4.1 3.5 4.3  CL 107 107 104 102  CO2 26 26 24  19*  GLUCOSE 244* 255* 115 430*  BUN 12 10 11 16   CALCIUM 9.5 10.1 9.9 9.9  CREATININE 0.82 0.90 0.84 1.07  GFRNONAA >60 >60 >60 >60  GFRAA >60 >60 >60 >60    LIVER FUNCTION TESTS: Recent Labs    01/08/18 0803 01/29/18 0800 02/19/18 0802 02/26/18 0756  BILITOT 0.4 0.3 0.5 1.0  AST 14 12 15 19   ALT 15 16 15 25   ALKPHOS 98 115 93 120  PROT 6.9 7.3 7.6 8.1  ALBUMIN 3.7 3.9 4.0 3.9    Assessment and Plan: Pt with history of metastatic gastric cancer, on chemotherapy.  Recent CT scan shows enlarging 4 cm necrotic right level III/IV lymph node and he presents again today for image guided biopsy of this lymph node for Foundation One and Her 2  Immuno.Risks and benefits discussed with the patient including, but not  limited to bleeding, infection, damage to adjacent structures or low yield requiring additional tests.  All of the patient's questions were answered, patient is agreeable to proceed. Consent signed and in chart.     Electronically Signed: D. Rowe Robert, PA-C 02/28/2018, 11:38 AM   I spent a total of 20 minutes at the the patient's bedside AND on the patient's hospital floor or unit, greater than 50% of which was counseling/coordinating care for image guided right cervical lymph node biopsy

## 2018-02-28 NOTE — Sedation Documentation (Signed)
Patient is resting comfortably, snoring, in NAD. 

## 2018-02-28 NOTE — Discharge Instructions (Signed)
Moderate Conscious Sedation, Adult, Care After °These instructions provide you with information about caring for yourself after your procedure. Your health care provider may also give you more specific instructions. Your treatment has been planned according to current medical practices, but problems sometimes occur. Call your health care provider if you have any problems or questions after your procedure. °What can I expect after the procedure? °After your procedure, it is common: °· To feel sleepy for several hours. °· To feel clumsy and have poor balance for several hours. °· To have poor judgment for several hours. °· To vomit if you eat too soon. ° °Follow these instructions at home: °For at least 24 hours after the procedure: ° °· Do not: °? Participate in activities where you could fall or become injured. °? Drive. °? Use heavy machinery. °? Drink alcohol. °? Take sleeping pills or medicines that cause drowsiness. °? Make important decisions or sign legal documents. °? Take care of children on your own. °· Rest. °Eating and drinking °· Follow the diet recommended by your health care provider. °· If you vomit: °? Drink water, juice, or soup when you can drink without vomiting. °? Make sure you have little or no nausea before eating solid foods. °General instructions °· Have a responsible adult stay with you until you are awake and alert. °· Take over-the-counter and prescription medicines only as told by your health care provider. °· If you smoke, do not smoke without supervision. °· Keep all follow-up visits as told by your health care provider. This is important. °Contact a health care provider if: °· You keep feeling nauseous or you keep vomiting. °· You feel light-headed. °· You develop a rash. °· You have a fever. °Get help right away if: °· You have trouble breathing. °This information is not intended to replace advice given to you by your health care provider. Make sure you discuss any questions you have  with your health care provider. °Document Released: 07/01/2013 Document Revised: 02/13/2016 Document Reviewed: 12/31/2015 °Elsevier Interactive Patient Education © 2018 [employer] ° ° °Needle Biopsy, Care After °These instructions give you information about caring for yourself after your procedure. Your doctor may also give you more specific instructions. Call your doctor if you have any problems or questions after your procedure. °Follow these instructions at home: °· Rest as told by your doctor. °· Take medicines only as told by your doctor. °· There are many different ways to close and cover the biopsy site, including stitches (sutures), skin glue, and adhesive strips. Follow instructions from your doctor about: °? How to take care of your biopsy site. °? When and how you should change your bandage (dressing). °? When you should remove your dressing. °? Removing whatever was used to close your biopsy site. °· Check your biopsy site every day for signs of infection. Watch for: °? Redness, swelling, or pain. °? Fluid, blood, or pus. °Contact a doctor if: °· You have a fever. °· You have redness, swelling, or pain at the biopsy site, and it lasts longer than a few days. °· You have fluid, blood, or pus coming from the biopsy site. °· You feel sick to your stomach (nauseous). °· You throw up (vomit). °Get help right away if: °· You are short of breath. °· You have trouble breathing. °· Your chest hurts. °· You feel dizzy or you pass out (faint). °· You have bleeding that does not stop with pressure or a bandage. °· You cough up blood. °·   Your belly (abdomen) hurts. °This information is not intended to replace advice given to you by your health care provider. Make sure you discuss any questions you have with your health care provider. °Document Released: 08/23/2008 Document Revised: 02/16/2016 Document Reviewed: 09/06/2014 °Elsevier Interactive Patient Education © 2018 [employer]. ° °

## 2018-02-28 NOTE — Procedures (Signed)
Pre Procedure Dx: Gastric Cancer Post Procedural Dx: Same  Technically successful US guided biopsy of indeterminate right cervical LN  EBL: None  No immediate complications.   Ronny Bacon, MD Pager #: [phone]

## 2018-03-02 ENCOUNTER — Other Ambulatory Visit: Payer: Self-pay | Admitting: Oncology

## 2018-03-03 ENCOUNTER — Telehealth: Payer: Self-pay | Admitting: *Deleted

## 2018-03-03 ENCOUNTER — Encounter: Payer: Self-pay | Admitting: Pharmacist

## 2018-03-03 ENCOUNTER — Other Ambulatory Visit: Payer: Self-pay | Admitting: Oncology

## 2018-03-05 ENCOUNTER — Inpatient Hospital Stay: Payer: BLUE CROSS/BLUE SHIELD

## 2018-03-05 ENCOUNTER — Telehealth: Payer: Self-pay | Admitting: Oncology

## 2018-03-05 ENCOUNTER — Inpatient Hospital Stay (HOSPITAL_BASED_OUTPATIENT_CLINIC_OR_DEPARTMENT_OTHER): Payer: BLUE CROSS/BLUE SHIELD | Admitting: Oncology

## 2018-03-05 VITALS — BP 141/98 | HR 108 | Temp 98.8°F | Resp 17

## 2018-03-05 VITALS — BP 131/99 | HR 111 | Temp 98.6°F | Resp 18 | Ht 66.0 in | Wt 161.4 lb

## 2018-03-05 DIAGNOSIS — C160 Malignant neoplasm of cardia: Secondary | ICD-10-CM

## 2018-03-05 DIAGNOSIS — L539 Erythematous condition, unspecified: Secondary | ICD-10-CM | POA: Diagnosis not present

## 2018-03-05 DIAGNOSIS — I10 Essential (primary) hypertension: Secondary | ICD-10-CM

## 2018-03-05 DIAGNOSIS — C770 Secondary and unspecified malignant neoplasm of lymph nodes of head, face and neck: Secondary | ICD-10-CM

## 2018-03-05 DIAGNOSIS — Z95828 Presence of other vascular implants and grafts: Secondary | ICD-10-CM

## 2018-03-05 DIAGNOSIS — E119 Type 2 diabetes mellitus without complications: Secondary | ICD-10-CM | POA: Diagnosis not present

## 2018-03-05 LAB — CBC WITH DIFFERENTIAL (CANCER CENTER ONLY)
BASOS ABS: 0 10*3/uL (ref 0.0–0.1)
BASOS PCT: 0 %
EOS PCT: 0 %
Eosinophils Absolute: 0 10*3/uL (ref 0.0–0.5)
HEMATOCRIT: 37.5 % — AB (ref 38.4–49.9)
Hemoglobin: 12.2 g/dL — ABNORMAL LOW (ref 13.0–17.1)
LYMPHS PCT: 9 %
Lymphs Abs: 0.7 10*3/uL — ABNORMAL LOW (ref 0.9–3.3)
MCH: 26.4 pg — ABNORMAL LOW (ref 27.2–33.4)
MCHC: 32.5 g/dL (ref 32.0–36.0)
MCV: 81.2 fL (ref 79.3–98.0)
Monocytes Absolute: 0.2 10*3/uL (ref 0.1–0.9)
Monocytes Relative: 2 %
NEUTROS ABS: 6.6 10*3/uL — AB (ref 1.5–6.5)
Neutrophils Relative %: 89 %
PLATELETS: 355 10*3/uL (ref 140–400)
RBC: 4.62 MIL/uL (ref 4.20–5.82)
RDW: 14.9 % — AB (ref 11.0–14.6)
WBC: 7.4 10*3/uL (ref 4.0–10.3)

## 2018-03-05 LAB — CMP (CANCER CENTER ONLY)
ALBUMIN: 4 g/dL (ref 3.5–5.0)
ALT: 10 U/L (ref 0–55)
AST: 9 U/L (ref 5–34)
Alkaline Phosphatase: 120 U/L (ref 40–150)
Anion gap: 15 — ABNORMAL HIGH (ref 3–11)
BUN: 12 mg/dL (ref 7–26)
CHLORIDE: 104 mmol/L (ref 98–109)
CO2: 23 mmol/L (ref 22–29)
Calcium: 10.7 mg/dL — ABNORMAL HIGH (ref 8.4–10.4)
Creatinine: 0.94 mg/dL (ref 0.70–1.30)
GFR, Est AFR Am: >60 mL/min (ref >60)
GLUCOSE: 308 mg/dL — AB (ref 70–140)
POTASSIUM: 4.1 mmol/L (ref 3.5–5.1)
Sodium: 142 mmol/L (ref 136–145)
Total Bilirubin: 0.3 mg/dL (ref 0.2–1.2)
Total Protein: 8.5 g/dL — ABNORMAL HIGH (ref 6.4–8.3)

## 2018-03-05 LAB — TOTAL PROTEIN, URINE DIPSTICK: PROTEIN: NEGATIVE mg/dL

## 2018-03-05 MED ORDER — ACETAMINOPHEN 325 MG PO TABS
ORAL_TABLET | ORAL | Status: AC
  Filled 2018-03-05: qty 2

## 2018-03-05 MED ORDER — SODIUM CHLORIDE 0.9% FLUSH
10.0000 mL | INTRAVENOUS | Status: DC | PRN
  Administered 2018-03-05: 10 mL via INTRAVENOUS
  Filled 2018-03-05: qty 10

## 2018-03-05 MED ORDER — DIPHENHYDRAMINE HCL 50 MG/ML IJ SOLN
50.0000 mg | Freq: Once | INTRAMUSCULAR | Status: AC
  Administered 2018-03-05: 50 mg via INTRAVENOUS

## 2018-03-05 MED ORDER — HEPARIN SOD (PORK) LOCK FLUSH 100 UNIT/ML IV SOLN
500.0000 [IU] | Freq: Once | INTRAVENOUS | Status: AC | PRN
  Administered 2018-03-05: 500 [IU]
  Filled 2018-03-05: qty 5

## 2018-03-05 MED ORDER — ACETAMINOPHEN 325 MG PO TABS
650.0000 mg | ORAL_TABLET | Freq: Once | ORAL | Status: AC
  Administered 2018-03-05: 650 mg via ORAL

## 2018-03-05 MED ORDER — SODIUM CHLORIDE 0.9% FLUSH
10.0000 mL | INTRAVENOUS | Status: DC | PRN
Start: 2018-03-05 — End: 2018-03-05
  Administered 2018-03-05: 10 mL
  Filled 2018-03-05: qty 10

## 2018-03-05 MED ORDER — FAMOTIDINE IN NACL 20-0.9 MG/50ML-% IV SOLN
20.0000 mg | Freq: Once | INTRAVENOUS | Status: AC
  Administered 2018-03-05: 20 mg via INTRAVENOUS

## 2018-03-05 MED ORDER — DEXAMETHASONE SODIUM PHOSPHATE 10 MG/ML IJ SOLN
10.0000 mg | Freq: Once | INTRAMUSCULAR | Status: AC
Start: 2018-03-05 — End: 2018-03-05
  Administered 2018-03-05: 10 mg via INTRAVENOUS

## 2018-03-05 MED ORDER — DEXAMETHASONE SODIUM PHOSPHATE 10 MG/ML IJ SOLN
INTRAMUSCULAR | Status: AC
  Filled 2018-03-05: qty 1

## 2018-03-05 MED ORDER — SODIUM CHLORIDE 0.9 % IV SOLN
Freq: Once | INTRAVENOUS | Status: AC
  Administered 2018-03-05: 09:00:00 via INTRAVENOUS

## 2018-03-05 MED ORDER — DIPHENHYDRAMINE HCL 50 MG/ML IJ SOLN
INTRAMUSCULAR | Status: AC
  Filled 2018-03-05: qty 1

## 2018-03-05 MED ORDER — SODIUM CHLORIDE 0.9 % IV SOLN
80.0000 mg/m2 | Freq: Once | INTRAVENOUS | Status: AC
  Administered 2018-03-05: 150 mg via INTRAVENOUS
  Filled 2018-03-05: qty 25

## 2018-03-05 MED ORDER — SODIUM CHLORIDE 0.9 % IV SOLN
8.0000 mg/kg | Freq: Once | INTRAVENOUS | Status: AC
  Administered 2018-03-05: 600 mg via INTRAVENOUS
  Filled 2018-03-05: qty 50

## 2018-03-05 MED ORDER — FAMOTIDINE IN NACL 20-0.9 MG/50ML-% IV SOLN
INTRAVENOUS | Status: AC
  Filled 2018-03-05: qty 50

## 2018-03-05 NOTE — Progress Notes (Signed)
Ok to treat with elevated HR and BP per Dr. Benay Spice.

## 2018-03-05 NOTE — Progress Notes (Signed)
Per Dr. Benay Spice, ok to proceed with Cyramza with 03/05/18 vital signs.

## 2018-03-05 NOTE — Progress Notes (Signed)
[facility] OFFICE PROGRESS NOTE   Diagnosis: Gastric cancer  INTERVAL HISTORY:   Robert Beck returns for a scheduled visit.  He underwent ultrasound-guided biopsy of a right cervical lymph node on 02/28/2018.  He reports tolerating the procedure well.  He developed soreness and swelling over the weekend.  His has improved. No recurrent fever.  The erythema and induration at the right upper thigh has improved.  He took home Decadron premedication last night.   Objective:  Vital signs in last 24 hours:  Blood pressure (!) 131/99, pulse (!) 111, temperature 98.6 F (37 C), temperature source Oral, resp. rate 18, height _0  (1.676 m), weight 161 lb 6.4 oz (73.2 kg), SpO2 95 %.    HEENT: No thrush Lymphatics: Firm fullness in the right lower neck Resp: Lungs clear bilaterally Cardio: Regular rate and rhythm, tachycardia GI: No hepatomegaly, nontender Vascular: Leg edema  Skin: Faint area of erythema at the right upper posterior thigh  Portacath/PICC-without erythema  Lab Results:  Lab Results  Component Value Date   WBC 7.4 03/05/2018   HGB 12.2 (L) 03/05/2018   HCT 37.5 (L) 03/05/2018   MCV 81.2 03/05/2018   PLT 355 03/05/2018   NEUTROABS 6.6 (H) 03/05/2018    CMP  Lab Results  Component Value Date   NA 138 02/26/2018   K 4.3 02/26/2018   CL 102 02/26/2018   CO2 19 (L) 02/26/2018   GLUCOSE 430 (H) 02/26/2018   BUN 16 02/26/2018   CREATININE 1.07 02/26/2018   CALCIUM 9.9 02/26/2018   PROT 8.1 02/26/2018   ALBUMIN 3.9 02/26/2018   AST 19 02/26/2018   ALT 25 02/26/2018   ALKPHOS 120 02/26/2018   BILITOT 1.0 02/26/2018   GFRNONAA >60 02/26/2018   GFRAA >60 02/26/2018    Lab Results  Component Value Date   CEA1 3.83 06/27/2016     Medications: I have reviewed the patient's current medications.   Assessment/Plan: 1. Gastric cancer, gastric cardia mass extendingto the GE junction confirmed on endoscopy 04/11/2016, biopsy confirmed  adenocarcinoma  Staging CTs of the chest, abdomen, and pelvis 04/09/2016-gastric cardia mass, mediastinal, right hilar, and right supraclavicular lymphadenopathy  Biopsy of a right supraclavicular lymph node on 04/20/2016 revealed metastatic adenocarcinoma  FoundationACT 02/20/2017-TP53 alteration identified  Cycle 1 FOLFOX 04/30/2016 (oxaliplatin given 04/30/2016, 5-FU infusion started 05/02/2016)  Cycle 2 FOLFOX 05/14/2016  Cycle 3 FOLFOX 05/30/2016  Cycle 4 FOLFOX 06/13/2016  Cycle 5 FOLFOX 06/27/2016  Cycle 6 FOLFOX 07/11/2016  CTs 07/20/2016-decrease in supraclavicular, mediastinal, and gastrohepatic adenopathy. Decreased gastric cardia wall thickening.  Cycle 7 FOLFOX 07/25/2016  Cycle 8 FOLFOX 08/08/2016  Cycle 9 FOLFOX 08/22/2016 (oxaliplatin held due to neuropathy)  Cycle 10 FOLFOX 09/05/2016 (oxaliplatin held)  Cycle 11 FOLFOX 09/26/2016 (oxaliplatin held)  Cycle 12 FOLFOX 10/10/2016 (oxaliplatin held)  CT 10/18/2016-mild enlargement of mediastinal lymph nodes, no other evidence of disease progression, stable thickening at the GE junction and stable gastrohepatic lymphadenopathy  Cycle 13 FOLFOX 10/24/2016 (oxaliplatin held)  Maintenance Xeloda 7 days on/7 days off beginning 11/17/2016  Restaging chest CT 01/22/2017-interval progression of metastatic lymphadenopathy in the mediastinum and gastrohepatic ligament.  Salvage therapy with FOLFIRI initiated 02/20/2017  Cycle 5 FOLFIRI 04/17/2017  Restaging chest CT 04/29/2017-mild improvement in mediastinal adenopathy. No significant change in adenopathy in the gastrohepatic ligament. No evidence of disease progression.  Cycle 6 FOLFIRI 05/01/2017  Cycle 7 FOLFIRI 05/15/2017  Cycle 8 FOLFIRI 05/29/2017  Cycle 9 FOLFIRI 06/19/2017  Cycle 10 FOLFIRI 07/03/2017  Cycle  11 FOLFIRI 07/17/2017  CT chest 07/30/2017-mild decrease in mediastinal and gastrohepatic ligament lymphadenopathy, no evidence of  progressive disease  Cycle 12 FOLFIRI 07/31/2017  Cycle 13 FOLFIRI 08/14/2017  Cycle 14 FOLFIRI 09/04/2017  Cycle 15 FOLFIRI 09/25/2017  Cycle 16 FOLFIRI 10/16/2017  Cycle 17 FOLFIRI 11/06/2017  Cycle 18 FOLFIRI 11/27/2017  cycle 19 FOLFIRI 12/18/2017  CT 01/06/2018-stable neck/mediastinal nodes, slight enlargement of gastrohepatic ligament node stable periportal and portacaval nodes  Cycle 20 FOLFIRI 01/08/2018  Cycle 21 FOLFIRI 01/29/2018  CT neck 02/06/2018- 4 cm necrotic right levelIII/IVlymph node similar to the chest CT 01/06/2018, considerably larger than on 07/30/2017. Unchanged adjacent right level IV/supraclavicular lymph node and partially visualized mediastinal lymph nodes.  Cycle 1 Taxol/ramucirumab 03/05/2018  2. Anemia secondary to #1-improved  3. Diabetes  4. Gout  5. Hypertension  6. Hyperlipidemia  7. History of a T7 compression fracture  8. Pain secondary to the gastric mass and mediastinal lymphadenopathy-resolved  9. Port-A-Cath placement 04/20/2016  10. 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 1 mm obstructing stone distal right ureter/right ureteral vesicle junction on CT 09/13/2016.  13. 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 Hypercalcemia 12/19/2016-asymptomatic  15. Nausea following FOLFIRI-emend added beginning with cycle 3, trial of Reglan beginning12/08/2017, persistent  16.  Erythematous, mildly indurated rash right upper posterior leg in setting of possible recent tick bite status post course of doxycycline   Disposition: [name] appears unchanged.  The erythema and induration of the right upper thigh has improved.  No recurrent fever.  The plan is to proceed with Taxol/ramucirumab today.  We will follow-up  on the pathology from the right neck biopsy and request molecular testing if there is diagnostic tissue.  [name] will return for an office visit and Taxol in 1 week.  15 minutes were spent with the patient today.  The majority of the time was used for counseling and coordination of care.  Betsy Coder, MD  03/05/2018  8:35 AM

## 2018-03-05 NOTE — Telephone Encounter (Signed)
Scheduled appt per 6/12 los - gave patient AVS and calender per los.  

## 2018-03-05 NOTE — Patient Instructions (Signed)
[facility] Discharge Instructions for Patients Receiving Chemotherapy  Today you received the following chemotherapy agents Paclitaxel, Cyramza To help prevent nausea and vomiting after your treatment, we encourage you to take your nausea medication as directed   If you develop nausea and vomiting that is not controlled by your nausea medication, call the clinic.   BELOW ARE SYMPTOMS THAT SHOULD BE REPORTED IMMEDIATELY:  *FEVER GREATER THAN 100.5 F  *CHILLS WITH OR WITHOUT FEVER  NAUSEA AND VOMITING THAT IS NOT CONTROLLED WITH YOUR NAUSEA MEDICATION  *UNUSUAL SHORTNESS OF BREATH  *UNUSUAL BRUISING OR BLEEDING  TENDERNESS IN MOUTH AND THROAT WITH OR WITHOUT PRESENCE OF ULCERS  *URINARY PROBLEMS  *BOWEL PROBLEMS  UNUSUAL RASH Items with * indicate a potential emergency and should be followed up as soon as possible.  Feel free to call the clinic should you have any questions or concerns. The clinic phone number is [phone].  Please show the [facility] at check-in to the Emergency Department and triage nurse.  Paclitaxel injection What is this medicine? PACLITAXEL (PAK li TAX el) is a chemotherapy drug. It targets fast dividing cells, like cancer cells, and causes these cells to die. This medicine is used to treat ovarian cancer, breast cancer, and other cancers. This medicine may be used for other purposes; ask your health care provider or pharmacist if you have questions. COMMON BRAND NAME(S): Onxol, Taxol What should I tell my health care provider before I take this medicine? They need to know if you have any of these conditions: -blood disorders -irregular heartbeat -infection (especially a virus infection such as chickenpox, cold sores, or herpes) -liver disease -previous or ongoing radiation therapy -an unusual or allergic reaction to paclitaxel, alcohol, polyoxyethylated castor oil, other chemotherapy agents, other medicines, foods, dyes, or  preservatives -pregnant or trying to get pregnant -breast-feeding How should I use this medicine? This drug is given as an infusion into a vein. It is administered in a hospital or clinic by a specially trained health care professional. Talk to your pediatrician regarding the use of this medicine in children. Special care may be needed. Overdosage: If you think you have taken too much of this medicine contact a poison control center or emergency room at once. NOTE: This medicine is only for you. Do not share this medicine with others. What if I miss a dose? It is important not to miss your dose. Call your doctor or health care professional if you are unable to keep an appointment. What may interact with this medicine? Do not take this medicine with any of the following medications: -disulfiram -metronidazole This medicine may also interact with the following medications: -cyclosporine -diazepam -ketoconazole -medicines to increase blood counts like filgrastim, pegfilgrastim, sargramostim -other chemotherapy drugs like cisplatin, doxorubicin, epirubicin, etoposide, teniposide, vincristine -quinidine -testosterone -vaccines -verapamil Talk to your doctor or health care professional before taking any of these medicines: -acetaminophen -aspirin -ibuprofen -ketoprofen -naproxen This list may not describe all possible interactions. Give your health care provider a list of all the medicines, herbs, non-prescription drugs, or dietary supplements you use. Also tell them if you smoke, drink alcohol, or use illegal drugs. Some items may interact with your medicine. What should I watch for while using this medicine? Your condition will be monitored carefully while you are receiving this medicine. You will need important blood work done while you are taking this medicine. This medicine can cause serious allergic reactions. To reduce your risk you will need to take  other medicine(s) before  treatment with this medicine. If you experience allergic reactions like skin rash, itching or hives, swelling of the face, lips, or tongue, tell your doctor or health care professional right away. In some cases, you may be given additional medicines to help with side effects. Follow all directions for their use. This drug may make you feel generally unwell. This is not uncommon, as chemotherapy can affect healthy cells as well as cancer cells. Report any side effects. Continue your course of treatment even though you feel ill unless your doctor tells you to stop. Call your doctor or health care professional for advice if you get a fever, chills or sore throat, or other symptoms of a cold or flu. Do not treat yourself. This drug decreases your body's ability to fight infections. Try to avoid being around people who are sick. This medicine may increase your risk to bruise or bleed. Call your doctor or health care professional if you notice any unusual bleeding. Be careful brushing and flossing your teeth or using a toothpick because you may get an infection or bleed more easily. If you have any dental work done, tell your dentist you are receiving this medicine. Avoid taking products that contain aspirin, acetaminophen, ibuprofen, naproxen, or ketoprofen unless instructed by your doctor. These medicines may hide a fever. Do not become pregnant while taking this medicine. Women should inform their doctor if they wish to become pregnant or think they might be pregnant. There is a potential for serious side effects to an unborn child. Talk to your health care professional or pharmacist for more information. Do not breast-feed an infant while taking this medicine. Men are advised not to father a child while receiving this medicine. This product may contain alcohol. Ask your pharmacist or healthcare provider if this medicine contains alcohol. Be sure to tell all healthcare providers you are taking this medicine.  Certain medicines, like metronidazole and disulfiram, can cause an unpleasant reaction when taken with alcohol. The reaction includes flushing, headache, nausea, vomiting, sweating, and increased thirst. The reaction can last from 30 minutes to several hours. What side effects may I notice from receiving this medicine? Side effects that you should report to your doctor or health care professional as soon as possible: -allergic reactions like skin rash, itching or hives, swelling of the face, lips, or tongue -low blood counts - This drug may decrease the number of white blood cells, red blood cells and platelets. You may be at increased risk for infections and bleeding. -signs of infection - fever or chills, cough, sore throat, pain or difficulty passing urine -signs of decreased platelets or bleeding - bruising, pinpoint red spots on the skin, black, tarry stools, nosebleeds -signs of decreased red blood cells - unusually weak or tired, fainting spells, lightheadedness -breathing problems -chest pain -high or low blood pressure -mouth sores -nausea and vomiting -pain, swelling, redness or irritation at the injection site -pain, tingling, numbness in the hands or feet -slow or irregular heartbeat -swelling of the ankle, feet, hands Side effects that usually do not require medical attention (report to your doctor or health care professional if they continue or are bothersome): -bone pain -complete hair loss including hair on your head, underarms, pubic hair, eyebrows, and eyelashes -changes in the color of fingernails -diarrhea -loosening of the fingernails -loss of appetite -muscle or joint pain -red flush to skin -sweating This list may not describe all possible side effects. Call your doctor for medical advice about side   effects. You may report side effects to FDA at 1-800-FDA-1088. Where should I keep my medicine? This drug is given in a hospital or clinic and will not be stored at  home. NOTE: This sheet is a summary. It may not cover all possible information. If you have questions about this medicine, talk to your doctor, pharmacist, or health care provider.  2018 Elsevier/Gold Standard (2015-07-12 19:58:00)  Ramucirumab injection What is this medicine? RAMUCIRUMAB (ra mue SIR ue mab) is a monoclonal antibody. It is used to treat stomach cancer, colorectal cancer, or lung cancer. This medicine may be used for other purposes; ask your health care provider or pharmacist if you have questions. COMMON BRAND NAME(S): Cyramza What should I tell my health care provider before I take this medicine? They need to know if you have any of these conditions: -bleeding disorders -blood clots -heart disease, including heart failure, heart attack, or chest pain (angina) -high blood pressure -infection (especially a virus infection such as chickenpox, cold sores, or herpes) -protein in your urine -recent surgery -stroke -an unusual or allergic reaction to ramucirumab, other medicines, foods, dyes, or preservatives -pregnant or trying to get pregnant -breast-feeding How should I use this medicine? This medicine is for infusion into a vein. It is given by a health care professional in a hospital or clinic setting. Talk to your pediatrician regarding the use of this medicine in children. Special care may be needed. Overdosage: If you think you have taken too much of this medicine contact a poison control center or emergency room at once. NOTE: This medicine is only for you. Do not share this medicine with others. What if I miss a dose? It is important not to miss your dose. Call your doctor or health care professional if you are unable to keep an appointment. What may interact with this medicine? Interactions have not been studied. This list may not describe all possible interactions. Give your health care provider a list of all the medicines, herbs, non-prescription drugs, or  dietary supplements you use. Also tell them if you smoke, drink alcohol, or use illegal drugs. Some items may interact with your medicine. What should I watch for while using this medicine? Your condition will be monitored carefully while you are receiving this medicine. You will need to to check your blood pressure and have your blood and urine tested while you are taking this medicine. Your condition will be monitored carefully while you are receiving this medicine. This medicine may increase your risk to bruise or bleed. Call your doctor or health care professional if you notice any unusual bleeding. This medicine may rarely cause 'gastrointestinal perforation' (holes in the stomach, intestines or colon), a serious side effect requiring surgery to repair. This medicine should be started at least 28 days following major surgery and the site of the surgery should be totally healed. Check with your doctor before scheduling dental work or surgery while you are receiving this treatment. Talk to your doctor if you have recently had surgery or if you have a wound that has not healed. Do not become pregnant while taking this medicine or for 3 months after stopping it. Women should inform their doctor if they wish to become pregnant or think they might be pregnant. There is a potential for serious side effects to an unborn child. Talk to your health care professional or pharmacist for more information. What side effects may I notice from receiving this medicine? Side effects that you should report to your  doctor or health care professional as soon as possible: -allergic reactions like skin rash, itching or hives, breathing problems, swelling of the face, lips, or tongue -signs of infection - fever or chills, cough, sore throat -chest pain or chest tightness -confusion -dizziness -feeling faint or lightheaded, falls -severe abdominal pain -severe nausea, vomiting -signs and symptoms of bleeding such as  bloody or black, tarry stools; red or dark-brown urine; spitting up blood or brown material that looks like coffee grounds; red spots on the skin; unusual bruising or bleeding from the eye, gums, or nose -signs and symptoms of a blood clot such as breathing problems; changes in vision; chest pain; severe, sudden headache; pain, swelling, warmth in the leg; trouble speaking; sudden numbness or weakness of the face, arm or leg -symptoms of a stroke: change in mental awareness, inability to talk or move one side of the body -trouble walking, dizziness, loss of balance or coordination Side effects that usually do not require medical attention (report to your doctor or health care professional if they continue or are bothersome): -cold, clammy skin -constipation -diarrhea -headache -nausea, vomiting -stomach pain -unusually slow heartbeat -unusually weak or tired This list may not describe all possible side effects. Call your doctor for medical advice about side effects. You may report side effects to FDA at 1-800-FDA-1088. Where should I keep my medicine? This drug is given in a hospital or clinic and will not be stored at home. NOTE: This sheet is a summary. It may not cover all possible information. If you have questions about this medicine, talk to your doctor, pharmacist, or health care provider.  2018 Elsevier/Gold Standard (2015-10-13 08:20:29)

## 2018-03-06 ENCOUNTER — Other Ambulatory Visit: Payer: Self-pay | Admitting: Nurse Practitioner

## 2018-03-06 DIAGNOSIS — C160 Malignant neoplasm of cardia: Secondary | ICD-10-CM

## 2018-03-12 ENCOUNTER — Telehealth: Payer: Self-pay | Admitting: Nurse Practitioner

## 2018-03-12 ENCOUNTER — Inpatient Hospital Stay: Payer: BLUE CROSS/BLUE SHIELD

## 2018-03-12 ENCOUNTER — Encounter: Payer: Self-pay | Admitting: Nurse Practitioner

## 2018-03-12 ENCOUNTER — Inpatient Hospital Stay (HOSPITAL_BASED_OUTPATIENT_CLINIC_OR_DEPARTMENT_OTHER): Payer: BLUE CROSS/BLUE SHIELD | Admitting: Nurse Practitioner

## 2018-03-12 VITALS — BP 115/82 | HR 99 | Temp 98.3°F | Resp 18 | Ht 66.0 in | Wt 161.7 lb

## 2018-03-12 DIAGNOSIS — C160 Malignant neoplasm of cardia: Secondary | ICD-10-CM

## 2018-03-12 DIAGNOSIS — T451X5A Adverse effect of antineoplastic and immunosuppressive drugs, initial encounter: Secondary | ICD-10-CM

## 2018-03-12 DIAGNOSIS — G620 Drug-induced polyneuropathy: Secondary | ICD-10-CM

## 2018-03-12 DIAGNOSIS — R5383 Other fatigue: Secondary | ICD-10-CM | POA: Diagnosis not present

## 2018-03-12 DIAGNOSIS — I10 Essential (primary) hypertension: Secondary | ICD-10-CM

## 2018-03-12 DIAGNOSIS — Z95828 Presence of other vascular implants and grafts: Secondary | ICD-10-CM

## 2018-03-12 DIAGNOSIS — C770 Secondary and unspecified malignant neoplasm of lymph nodes of head, face and neck: Secondary | ICD-10-CM

## 2018-03-12 DIAGNOSIS — E119 Type 2 diabetes mellitus without complications: Secondary | ICD-10-CM | POA: Diagnosis not present

## 2018-03-12 LAB — CMP (CANCER CENTER ONLY)
ALBUMIN: 3.7 g/dL (ref 3.5–5.0)
ALT: 13 U/L (ref 0–55)
ANION GAP: 10 (ref 3–11)
AST: 13 U/L (ref 5–34)
Alkaline Phosphatase: 107 U/L (ref 40–150)
BUN: 14 mg/dL (ref 7–26)
CO2: 26 mmol/L (ref 22–29)
Calcium: 10.1 mg/dL (ref 8.4–10.4)
Chloride: 107 mmol/L (ref 98–109)
Creatinine: 0.8 mg/dL (ref 0.70–1.30)
GFR, Est AFR Am: >60 mL/min (ref >60)
GLUCOSE: 103 mg/dL (ref 70–140)
POTASSIUM: 3.8 mmol/L (ref 3.5–5.1)
Sodium: 143 mmol/L (ref 136–145)
Total Bilirubin: 0.2 mg/dL (ref 0.2–1.2)
Total Protein: 7.5 g/dL (ref 6.4–8.3)

## 2018-03-12 LAB — CBC WITH DIFFERENTIAL (CANCER CENTER ONLY)
BASOS PCT: 1 %
Basophils Absolute: 0.1 10*3/uL (ref 0.0–0.1)
Eosinophils Absolute: 0.2 10*3/uL (ref 0.0–0.5)
Eosinophils Relative: 3 %
HCT: 37 % — ABNORMAL LOW (ref 38.4–49.9)
Hemoglobin: 12.2 g/dL — ABNORMAL LOW (ref 13.0–17.1)
Lymphocytes Relative: 27 %
Lymphs Abs: 1.8 10*3/uL (ref 0.9–3.3)
MCH: 26.1 pg — ABNORMAL LOW (ref 27.2–33.4)
MCHC: 33.1 g/dL (ref 32.0–36.0)
MCV: 78.7 fL — ABNORMAL LOW (ref 79.3–98.0)
MONO ABS: 0.3 10*3/uL (ref 0.1–0.9)
MONOS PCT: 5 %
NEUTROS ABS: 4.3 10*3/uL (ref 1.5–6.5)
Neutrophils Relative %: 64 %
Platelet Count: 296 10*3/uL (ref 140–400)
RBC: 4.7 MIL/uL (ref 4.20–5.82)
RDW: 15.7 % — AB (ref 11.0–14.6)
WBC Count: 6.7 10*3/uL (ref 4.0–10.3)

## 2018-03-12 MED ORDER — FAMOTIDINE IN NACL 20-0.9 MG/50ML-% IV SOLN
INTRAVENOUS | Status: AC
  Filled 2018-03-12: qty 50

## 2018-03-12 MED ORDER — DEXAMETHASONE SODIUM PHOSPHATE 10 MG/ML IJ SOLN
10.0000 mg | Freq: Once | INTRAMUSCULAR | Status: AC
  Administered 2018-03-12: 10 mg via INTRAVENOUS

## 2018-03-12 MED ORDER — SODIUM CHLORIDE 0.9 % IV SOLN
80.0000 mg/m2 | Freq: Once | INTRAVENOUS | Status: AC
  Administered 2018-03-12: 150 mg via INTRAVENOUS
  Filled 2018-03-12: qty 25

## 2018-03-12 MED ORDER — FAMOTIDINE IN NACL 20-0.9 MG/50ML-% IV SOLN
20.0000 mg | Freq: Once | INTRAVENOUS | Status: AC
  Administered 2018-03-12: 20 mg via INTRAVENOUS

## 2018-03-12 MED ORDER — DEXAMETHASONE SODIUM PHOSPHATE 10 MG/ML IJ SOLN
INTRAMUSCULAR | Status: AC
  Filled 2018-03-12: qty 1

## 2018-03-12 MED ORDER — SODIUM CHLORIDE 0.9% FLUSH
10.0000 mL | INTRAVENOUS | Status: DC | PRN
  Administered 2018-03-12: 10 mL
  Filled 2018-03-12: qty 10

## 2018-03-12 MED ORDER — SODIUM CHLORIDE 0.9 % IV SOLN
Freq: Once | INTRAVENOUS | Status: AC
  Administered 2018-03-12: 09:00:00 via INTRAVENOUS

## 2018-03-12 MED ORDER — SODIUM CHLORIDE 0.9% FLUSH
10.0000 mL | INTRAVENOUS | Status: DC | PRN
  Administered 2018-03-12: 10 mL via INTRAVENOUS
  Filled 2018-03-12: qty 10

## 2018-03-12 MED ORDER — DIPHENHYDRAMINE HCL 50 MG/ML IJ SOLN
25.0000 mg | Freq: Once | INTRAMUSCULAR | Status: AC
  Administered 2018-03-12: 25 mg via INTRAVENOUS

## 2018-03-12 MED ORDER — DIPHENHYDRAMINE HCL 50 MG/ML IJ SOLN
INTRAMUSCULAR | Status: AC
  Filled 2018-03-12: qty 1

## 2018-03-12 MED ORDER — HEPARIN SOD (PORK) LOCK FLUSH 100 UNIT/ML IV SOLN
500.0000 [IU] | Freq: Once | INTRAVENOUS | Status: AC | PRN
Start: 2018-03-12 — End: 2018-03-12
  Administered 2018-03-12: 500 [IU]
  Filled 2018-03-12: qty 5

## 2018-03-12 NOTE — Telephone Encounter (Signed)
Scheduled appt per 6/19 los - gave patient aVS and calender per los.  

## 2018-03-12 NOTE — Patient Instructions (Signed)
Implanted Port Home Guide An implanted port is a type of central line that is placed under the skin. Central lines are used to provide IV access when treatment or nutrition needs to be given through a person's veins. Implanted ports are used for long-term IV access. An implanted port may be placed because:  You need IV medicine that would be irritating to the small veins in your hands or arms.  You need long-term IV medicines, such as antibiotics.  You need IV nutrition for a long period.  You need frequent blood draws for lab tests.  You need dialysis.  Implanted ports are usually placed in the chest area, but they can also be placed in the upper arm, the abdomen, or the leg. An implanted port has two main parts:  Reservoir. The reservoir is round and will appear as a small, raised area under your skin. The reservoir is the part where a needle is inserted to give medicines or draw blood.  Catheter. The catheter is a thin, flexible tube that extends from the reservoir. The catheter is placed into a large vein. Medicine that is inserted into the reservoir goes into the catheter and then into the vein.  How will I care for my incision site? Do not get the incision site wet. Bathe or shower as directed by your health care provider. How is my port accessed? Special steps must be taken to access the port:  Before the port is accessed, a numbing cream can be placed on the skin. This helps numb the skin over the port site.  Your health care provider uses a sterile technique to access the port. ? Your health care provider must put on a mask and sterile gloves. ? The skin over your port is cleaned carefully with an antiseptic and allowed to dry. ? The port is gently pinched between sterile gloves, and a needle is inserted into the port.  Only "non-coring" port needles should be used to access the port. Once the port is accessed, a blood return should be checked. This helps ensure that the port  is in the vein and is not clogged.  If your port needs to remain accessed for a constant infusion, a clear (transparent) bandage will be placed over the needle site. The bandage and needle will need to be changed every week, or as directed by your health care provider.  Keep the bandage covering the needle clean and dry. Do not get it wet. Follow your health care provider's instructions on how to take a shower or bath while the port is accessed.  If your port does not need to stay accessed, no bandage is needed over the port.  What is flushing? Flushing helps keep the port from getting clogged. Follow your health care provider's instructions on how and when to flush the port. Ports are usually flushed with saline solution or a medicine called heparin. The need for flushing will depend on how the port is used.  If the port is used for intermittent medicines or blood draws, the port will need to be flushed: ? After medicines have been given. ? After blood has been drawn. ? As part of routine maintenance.  If a constant infusion is running, the port may not need to be flushed.  How long will my port stay implanted? The port can stay in for as long as your health care provider thinks it is needed. When it is time for the port to come out, surgery will be   done to remove it. The procedure is similar to the one performed when the port was put in. When should I seek immediate medical care? When you have an implanted port, you should seek immediate medical care if:  You notice a bad smell coming from the incision site.  You have swelling, redness, or drainage at the incision site.  You have more swelling or pain at the port site or the surrounding area.  You have a fever that is not controlled with medicine.  This information is not intended to replace advice given to you by your health care provider. Make sure you discuss any questions you have with your health care provider. Document  Released: 09/10/2005 Document Revised: 02/16/2016 Document Reviewed: 05/18/2013 Elsevier Interactive Patient Education  2017 Elsevier Inc.  

## 2018-03-12 NOTE — Progress Notes (Signed)
[facility] OFFICE PROGRESS NOTE   Diagnosis: Gastric cancer  INTERVAL HISTORY:   Mr. Hereford returns as scheduled.  He completed cycle 1 day 1 Taxol/ramucirumab 03/05/2018.  He noted less nausea and diarrhea as compared to previous chemotherapy regimens.  No mouth sores.  No signs of allergic reaction.  No change in neuropathy symptoms involving the hands.  He thinks feet may be slightly worse but is not sure.  He is more fatigued.  He denies bleeding.  He has mild stable dyspnea on exertion.  No fever.  The rash at the right upper posterior leg has resolved.  Objective:  Vital signs in last 24 hours:  Blood pressure 115/82, pulse 99, temperature 98.3 F (36.8 C), temperature source Oral, resp. rate 18, height _0  (1.676 m), weight 161 lb 11.2 oz (73.3 kg), SpO2 98 %.    HEENT: No thrush or ulcers. Lymphatics: Palpable lymph nodes right low anterior neck. Resp: Lungs clear bilaterally. Cardio: Regular rate and rhythm. GI: Abdomen soft and nontender.  No hepatomegaly. Vascular: No leg edema.  Skin: No rash right upper posterior leg. Port-A-Cath without erythema.   Lab Results:  Lab Results  Component Value Date   WBC 6.7 03/12/2018   HGB 12.2 (L) 03/12/2018   HCT 37.0 (L) 03/12/2018   MCV 78.7 (L) 03/12/2018   PLT 296 03/12/2018   NEUTROABS 4.3 03/12/2018    Imaging:  No results found.  Medications: I have reviewed the patient's current medications.  Assessment/Plan: 1. 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 Decreased gastric cardia wall thickening.  Cycle 7 FOLFOX 07/25/2016  Cycle 8 FOLFOX 08/08/2016  Cycle 9 FOLFOX 08/22/2016 (oxaliplatin held due to neuropathy)  Cycle 10 FOLFOX 09/05/2016 (oxaliplatin held)  Cycle 11 FOLFOX 09/26/2016 (oxaliplatin held)  Cycle 12 FOLFOX 10/10/2016 (oxaliplatin held)  CT 10/18/2016-mild enlargement of mediastinal lymph nodes, no other evidence of disease progression, stable thickening at the GE junction and stable gastrohepatic lymphadenopathy  Cycle 13 FOLFOX 10/24/2016 (oxaliplatin held)  Maintenance Xeloda 7 days on/7 days off beginning 11/17/2016  Restaging chest CT 01/22/2017-interval progression of metastatic lymphadenopathy in the mediastinum and gastrohepatic ligament.  Salvage therapy with FOLFIRI initiated 02/20/2017  Cycle 5 FOLFIRI 04/17/2017  Restaging chest CT 04/29/2017-mild improvement in mediastinal adenopathy. No significant change in adenopathy in the gastrohepatic ligament. No evidence of disease progression.  Cycle 6 FOLFIRI 05/01/2017  Cycle 7 FOLFIRI 05/15/2017  Cycle 8 FOLFIRI 05/29/2017  Cycle 9 FOLFIRI 06/19/2017  Cycle 10 FOLFIRI 07/03/2017  Cycle 11 FOLFIRI 07/17/2017  CT chest 07/30/2017-mild decrease in mediastinal and gastrohepatic ligament lymphadenopathy, no evidence of progressive disease  Cycle 12 FOLFIRI 07/31/2017  Cycle 13 FOLFIRI 08/14/2017  Cycle 14 FOLFIRI 09/04/2017  Cycle 15 FOLFIRI 09/25/2017  Cycle 16 FOLFIRI 10/16/2017  Cycle 17 FOLFIRI 11/06/2017  Cycle 18 FOLFIRI 11/27/2017  cycle 19 FOLFIRI 12/18/2017  CT 01/06/2018-stable neck/mediastinal nodes, slight enlargement of gastrohepatic ligament node stable  periportal and portacaval nodes  Cycle 20 FOLFIRI 01/08/2018  Cycle 21 FOLFIRI 01/29/2018  CT neck 02/06/2018- 4 cm necrotic right levelIII/IVlymph node similar to the chest CT 01/06/2018, considerably larger than on 07/30/2017. Unchanged adjacent right level IV/supraclavicular lymph node and partially visualized mediastinal lymph nodes.  Cycle 1 Taxol/ramucirumab 03/05/2018  2. Anemia secondary to #1-improved  3. Diabetes  4. Gout  5. Hypertension  6. Hyperlipidemia  7. History of a T7 compression fracture  8. Pain secondary to the gastric mass and mediastinal lymphadenopathy-resolved  9. Port-A-Cath placement 04/20/2016  10. 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 1 mm obstructing stone distal right ureter/right ureteral vesicle junction on CT 09/13/2016.  13. 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 Hypercalcemia 12/19/2016-asymptomatic  15. Nausea following FOLFIRI-emend added beginning with cycle 3, trial of Reglan beginning12/08/2017, persistent  16.Erythematous, mildly indurated rash right upper posterior leg in setting of possible recent tick bite status post course of doxycycline.  Resolved.     Disposition: Mr. Umbarger appears stable.  He tolerated cycle 1 day 1 Taxol/ramucirumab well overall.  Plan to proceed with day 8 Taxol today as scheduled.  He will return for Taxol/ramucirumab in 1 week.  He will return for lab, follow-up and cycle 2 day 1 Taxol/ramucirumab 04/02/2018.  He will contact the office in the interim with any problems.  CBC from today reviewed.  Counts adequate for treatment.  Ned Card ANP/GNP-BC   03/12/2018  8:44 AM

## 2018-03-12 NOTE — Patient Instructions (Signed)
[facility] Discharge Instructions for Patients Receiving Chemotherapy  Today you received the following chemotherapy agents taxol  To help prevent nausea and vomiting after your treatment, we encourage you to take your nausea medication as directed   If you develop nausea and vomiting that is not controlled by your nausea medication, call the clinic.   BELOW ARE SYMPTOMS THAT SHOULD BE REPORTED IMMEDIATELY:  *FEVER GREATER THAN 100.5 F  *CHILLS WITH OR WITHOUT FEVER  NAUSEA AND VOMITING THAT IS NOT CONTROLLED WITH YOUR NAUSEA MEDICATION  *UNUSUAL SHORTNESS OF BREATH  *UNUSUAL BRUISING OR BLEEDING  TENDERNESS IN MOUTH AND THROAT WITH OR WITHOUT PRESENCE OF ULCERS  *URINARY PROBLEMS  *BOWEL PROBLEMS  UNUSUAL RASH Items with * indicate a potential emergency and should be followed up as soon as possible.  Feel free to call the clinic you have any questions or concerns. The clinic phone number is [phone].  

## 2018-03-14 ENCOUNTER — Encounter (HOSPITAL_COMMUNITY): Payer: Self-pay | Admitting: Oncology

## 2018-03-16 ENCOUNTER — Other Ambulatory Visit: Payer: Self-pay | Admitting: Oncology

## 2018-03-19 ENCOUNTER — Encounter (HOSPITAL_COMMUNITY): Payer: Self-pay | Admitting: Oncology

## 2018-03-19 ENCOUNTER — Inpatient Hospital Stay: Payer: BLUE CROSS/BLUE SHIELD

## 2018-03-19 ENCOUNTER — Inpatient Hospital Stay (HOSPITAL_BASED_OUTPATIENT_CLINIC_OR_DEPARTMENT_OTHER): Payer: BLUE CROSS/BLUE SHIELD | Admitting: Medical

## 2018-03-19 ENCOUNTER — Other Ambulatory Visit: Payer: Self-pay | Admitting: Medical

## 2018-03-19 VITALS — BP 128/96 | HR 115 | Temp 98.5°F | Resp 18

## 2018-03-19 DIAGNOSIS — C160 Malignant neoplasm of cardia: Secondary | ICD-10-CM | POA: Diagnosis not present

## 2018-03-19 DIAGNOSIS — J302 Other seasonal allergic rhinitis: Secondary | ICD-10-CM

## 2018-03-19 DIAGNOSIS — J029 Acute pharyngitis, unspecified: Secondary | ICD-10-CM | POA: Diagnosis not present

## 2018-03-19 DIAGNOSIS — Z95828 Presence of other vascular implants and grafts: Secondary | ICD-10-CM

## 2018-03-19 LAB — CBC WITH DIFFERENTIAL (CANCER CENTER ONLY)
BASOS ABS: 0 10*3/uL (ref 0.0–0.1)
Basophils Relative: 1 %
Eosinophils Absolute: 0.1 10*3/uL (ref 0.0–0.5)
Eosinophils Relative: 3 %
HEMATOCRIT: 39.1 % (ref 38.4–49.9)
Hemoglobin: 13 g/dL (ref 13.0–17.1)
LYMPHS PCT: 47 %
Lymphs Abs: 2.5 10*3/uL (ref 0.9–3.3)
MCH: 26.5 pg — ABNORMAL LOW (ref 27.2–33.4)
MCHC: 33.3 g/dL (ref 32.0–36.0)
MCV: 79.6 fL (ref 79.3–98.0)
MONO ABS: 0.6 10*3/uL (ref 0.1–0.9)
Monocytes Relative: 12 %
NEUTROS ABS: 1.9 10*3/uL (ref 1.5–6.5)
Neutrophils Relative %: 37 %
Platelet Count: 307 10*3/uL (ref 140–400)
RBC: 4.91 MIL/uL (ref 4.20–5.82)
RDW: 16.8 % — ABNORMAL HIGH (ref 11.0–14.6)
WBC: 5.2 10*3/uL (ref 4.0–10.3)

## 2018-03-19 LAB — CMP (CANCER CENTER ONLY)
ALT: 18 U/L (ref 0–44)
AST: 14 U/L — AB (ref 15–41)
Albumin: 3.8 g/dL (ref 3.5–5.0)
Alkaline Phosphatase: 105 U/L (ref 38–126)
Anion gap: 12 (ref 5–15)
BILIRUBIN TOTAL: 0.3 mg/dL (ref 0.3–1.2)
BUN: 12 mg/dL (ref 6–20)
CO2: 23 mmol/L (ref 22–32)
CREATININE: 0.79 mg/dL (ref 0.61–1.24)
Calcium: 10.7 mg/dL — ABNORMAL HIGH (ref 8.9–10.3)
Chloride: 109 mmol/L (ref 98–111)
GFR, Est AFR Am: >60 mL/min (ref >60)
Glucose, Bld: 92 mg/dL (ref 70–99)
Potassium: 3.6 mmol/L (ref 3.5–5.1)
Sodium: 144 mmol/L (ref 135–145)
TOTAL PROTEIN: 8 g/dL (ref 6.5–8.1)

## 2018-03-19 LAB — TOTAL PROTEIN, URINE DIPSTICK: Protein, ur: 30 mg/dL — AB

## 2018-03-19 MED ORDER — DEXAMETHASONE SODIUM PHOSPHATE 10 MG/ML IJ SOLN
INTRAMUSCULAR | Status: AC
  Filled 2018-03-19: qty 1

## 2018-03-19 MED ORDER — DIPHENHYDRAMINE HCL 50 MG/ML IJ SOLN
25.0000 mg | Freq: Once | INTRAMUSCULAR | Status: AC
  Administered 2018-03-19: 25 mg via INTRAVENOUS

## 2018-03-19 MED ORDER — SODIUM CHLORIDE 0.9% FLUSH
10.0000 mL | INTRAVENOUS | Status: DC | PRN
  Administered 2018-03-19: 10 mL via INTRAVENOUS
  Filled 2018-03-19: qty 10

## 2018-03-19 MED ORDER — PREDNISONE 1 MG PO TABS: ORAL_TABLET | ORAL | 0 refills | Status: DC

## 2018-03-19 MED ORDER — DIPHENHYDRAMINE HCL 50 MG/ML IJ SOLN
INTRAMUSCULAR | Status: AC
  Filled 2018-03-19: qty 1

## 2018-03-19 MED ORDER — SODIUM CHLORIDE 0.9% FLUSH
10.0000 mL | INTRAVENOUS | Status: DC | PRN
  Administered 2018-03-19: 10 mL
  Filled 2018-03-19: qty 10

## 2018-03-19 MED ORDER — RAMUCIRUMAB CHEMO INJECTION 500 MG/50ML
8.0000 mg/kg | Freq: Once | INTRAVENOUS | Status: AC
  Administered 2018-03-19: 600 mg via INTRAVENOUS
  Filled 2018-03-19: qty 10

## 2018-03-19 MED ORDER — ACETAMINOPHEN 325 MG PO TABS
ORAL_TABLET | ORAL | Status: AC
  Filled 2018-03-19: qty 2

## 2018-03-19 MED ORDER — SODIUM CHLORIDE 0.9 % IV SOLN
80.0000 mg/m2 | Freq: Once | INTRAVENOUS | Status: AC
  Administered 2018-03-19: 150 mg via INTRAVENOUS
  Filled 2018-03-19: qty 25

## 2018-03-19 MED ORDER — FAMOTIDINE IN NACL 20-0.9 MG/50ML-% IV SOLN
20.0000 mg | Freq: Once | INTRAVENOUS | Status: AC
  Administered 2018-03-19: 20 mg via INTRAVENOUS

## 2018-03-19 MED ORDER — MAGIC MOUTHWASH: 5.0000 mL | Freq: Four times a day (QID) | ORAL | 0 refills | Status: DC | PRN

## 2018-03-19 MED ORDER — HEPARIN SOD (PORK) LOCK FLUSH 100 UNIT/ML IV SOLN
500.0000 [IU] | Freq: Once | INTRAVENOUS | Status: AC | PRN
  Administered 2018-03-19: 500 [IU]
  Filled 2018-03-19: qty 5

## 2018-03-19 MED ORDER — SODIUM CHLORIDE 0.9 % IV SOLN
Freq: Once | INTRAVENOUS | Status: AC
  Administered 2018-03-19: 09:00:00 via INTRAVENOUS

## 2018-03-19 MED ORDER — FAMOTIDINE IN NACL 20-0.9 MG/50ML-% IV SOLN
INTRAVENOUS | Status: AC
  Filled 2018-03-19: qty 50

## 2018-03-19 MED ORDER — ACETAMINOPHEN 325 MG PO TABS
650.0000 mg | ORAL_TABLET | Freq: Once | ORAL | Status: AC
  Administered 2018-03-19: 650 mg via ORAL

## 2018-03-19 MED ORDER — DEXAMETHASONE SODIUM PHOSPHATE 10 MG/ML IJ SOLN
10.0000 mg | Freq: Once | INTRAMUSCULAR | Status: AC
  Administered 2018-03-19: 10 mg via INTRAVENOUS

## 2018-03-19 NOTE — Progress Notes (Signed)
[facility]pt[facility] Progress Note   Jahaziel Francois [id number] 12/23/1960 57 y.o.  Demarlo Riojas is managed by Dr. Dominica Severin B. 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  Seasonal allergies: The patient was given a low dose 5-day prednisone taper.  He will continue using Claritin as needed.  Adenocarcinoma of the gastric cardia: The patient is receiving cycle 1, day 8 of paclitaxel and Cyramza today.  He is scheduled to see Dr. Benay Spice in follow-up on 04/02/2018.  Please see After Visit Summary for patient specific instructions.  Future Appointments  Date Time Provider [place]  04/02/2018 10:15 AM [facility] LAB 4 [facility] None  04/02/2018 10:30 AM [facility] [place] FLUSH [facility] None  04/02/2018 11:00 AM Ladell Pier, MD [facility] None  04/02/2018 12:00 PM [facility] INFUSION [facility] None  04/09/2018  7:45 AM [facility] LAB 4 [facility] None  04/09/2018  8:00 AM [facility] [place] FLUSH [facility] None  04/09/2018  8:45 AM [facility] INFUSION [facility] None  04/16/2018  7:45 AM [facility] LAB 1 [facility] None  04/16/2018  8:00 AM [facility] [place] FLUSH [facility] None  04/16/2018  8:45 AM [facility] INFUSION [facility] None  04/30/2018  9:15 AM [facility] LAB 1 [facility] None  04/30/2018  9:30 AM [facility] [place] FLUSH [facility] None  04/30/2018 10:00 AM Ladell Pier, MD [facility] None  04/30/2018 11:15 AM [facility] INFUSION [facility] None  05/07/2018  8:00 AM [facility] LAB 1 [facility] None  05/07/2018  8:15 AM [facility] [place] FLUSH [facility]  None  05/07/2018  9:00 AM [facility] INFUSION [facility] None  05/14/2018  8:00 AM [facility] LAB 1 [facility] None  05/14/2018  8:15 AM [facility] [place] FLUSH [facility] None  05/14/2018  9:00 AM [facility] INFUSION [facility] None    No orders of the defined types were placed in this encounter.      Subjective:   Patient ID:  Con Arganbright is a 57 y.o. (DOB Mar 27, 1961) male.  Chief Complaint: No chief complaint on file.   HPI Nayshawn Mesta is a 57 year old male with a history of gastric cancer who is managed by Dr. Dominica Severin B. Sherrill.  The patient was seen in the infusion room today where he was cycle 1, day 8 of paclitaxel and Cyramza with the patient's received paclitaxel only today.  He has had a sore throat since last Friday.  He reports that he cut grass without a mask.  On Saturday he burned yard waste without a mask.  He has a history of seasonal allergies.  He has been taking Claritin.  He denies fevers, chills, sweats, rhinorrhea, sinus pressure, sinus pain, post nasal drainage, or sick contacts   Medications: I have reviewed the patient's current medications.  Allergies: No Known Allergies  Past Medical History:  Diagnosis Date  . Anemia   . Asthma   . COPD (chronic obstructive pulmonary disease) ([place])   . Depression   . Dyspnea    increased exertion  . gastric ca dx'd 01/2016  . Gout   . Hyperlipidemia   . Hypertension   . Iron deficiency anemia   . Ischemia   . Osteoarthritis   . RLS (restless  legs syndrome)   . Type 2 diabetes, controlled, with neuropathy ([place])   . Vitamin B12 deficiency     Past Surgical History:  Procedure Laterality Date  . ANKLE FRACTURE SURGERY    . CIRCUMCISION  2008  . COLONOSCOPY    . IR GENERIC HISTORICAL  04/20/2016   IR FLUORO GUIDE CV LINE RIGHT 04/20/2016 [facility]  . IR GENERIC HISTORICAL  04/20/2016   IR US GUIDE BX ASP/DRAIN 04/20/2016 [facility]  . IR GENERIC HISTORICAL  04/20/2016   IR US GUIDE VASC ACCESS RIGHT  04/20/2016 [facility]  . KNEE SURGERY    . NOSE SURGERY    . TONGUE SURGERY  2007   small growth removed from underneath his tongue, not cancerous.   [facility] UPPER GASTROINTESTINAL ENDOSCOPY      Family History  Problem Relation Age of Onset  . Leukemia Mother   . Breast cancer Mother   . Heart disease Father   . Colon cancer Neg Hx   . Colon polyps Neg Hx   . Esophageal cancer Neg Hx   . Rectal cancer Neg Hx   . Stomach cancer Neg Hx     Social History   Socioeconomic History  . Marital status: Significant Other    Spouse name: Not on file  . Number of children: Not on file  . Years of education: Not on file  . Highest education level: Not on file  Occupational History  . Not on file  Social Needs  . Financial resource strain: Not on file  . Food insecurity:    Worry: Not on file    Inability: Not on file  . Transportation needs:    Medical: Not on file    Non-medical: Not on file  Tobacco Use  . Smoking status: Former Smoker    Last attempt to quit: 09/24/2002    Years since quitting: 15.4  . Smokeless tobacco: Never Used  Substance and Sexual Activity  . Alcohol use: Yes    Alcohol/week: 0.0 oz    Comment: 2 drinks per week  . Drug use: No  . Sexual activity: Not on file  Lifestyle  . Physical activity:    Days per week: Not on file    Minutes per session: Not on file  . Stress: Not on file  Relationships  . Social connections:    Talks on phone: Not on file    Gets together: Not on file    Attends religious service: Not on file    Active member of club or organization: Not on file    Attends meetings of clubs or organizations: Not on file    Relationship status: Not on file  . Intimate partner violence:    Fear of current or ex partner: Not on file    Emotionally abused: Not on file    Physically abused: Not on file    Forced sexual activity: Not on file  Other Topics Concern  . Not on file  Social History Narrative  . Not on file    Past Medical  History, Surgical history, Social history, and Family history were reviewed and updated as appropriate.   Please see review of systems for further details on the patient's review from today.   Review of Systems:  Review of Systems  Constitutional: Negative for chills, diaphoresis and fever.  HENT: Positive for sore throat. Negative for postnasal drip, rhinorrhea, sinus pressure, sinus pain and trouble swallowing.   Respiratory: Negative for cough and  shortness of breath.     Objective:   Physical Exam:  There were no vitals taken for this visit. ECOG: 0  Physical Exam  Constitutional: No distress.  HENT:  Head: Normocephalic and atraumatic.  Mouth/Throat: Oropharynx is clear and moist. No oropharyngeal exudate.  Neck: Normal range of motion. Neck supple.  Cardiovascular: Regular rhythm and normal heart sounds. Tachycardia present. Exam reveals no friction rub.  No murmur heard. Pulmonary/Chest: Effort normal and breath sounds normal. No stridor. No respiratory distress. He has no wheezes. He has no rales.  Lymphadenopathy:    He has no cervical adenopathy.  Neurological: He is alert.  Skin: Skin is warm and dry. No rash noted. He is not diaphoretic. No erythema.    Lab Review:     Component Value Date/Time   NA 144 03/19/2018 0738   NA 140 09/25/2017 0855   K 3.6 03/19/2018 0738   K 3.6 09/25/2017 0855   CL 109 03/19/2018 0738   CO2 23 03/19/2018 0738   CO2 22 09/25/2017 0855   GLUCOSE 92 03/19/2018 0738   GLUCOSE 211 (H) 09/25/2017 0855   BUN 12 03/19/2018 0738   BUN 10.1 09/25/2017 0855   CREATININE 0.79 03/19/2018 0738   CREATININE 0.9 09/25/2017 0855   CALCIUM 10.7 (H) 03/19/2018 0738   CALCIUM 9.8 09/25/2017 0855   PROT 8.0 03/19/2018 0738   PROT 7.2 09/25/2017 0855   ALBUMIN 3.8 03/19/2018 0738   ALBUMIN 4.0 09/25/2017 0855   AST 14 (L) 03/19/2018 0738   AST 22 09/25/2017 0855   ALT 18 03/19/2018 0738   ALT 33 09/25/2017 0855   ALKPHOS 105 03/19/2018  0738   ALKPHOS 118 09/25/2017 0855   BILITOT 0.3 03/19/2018 0738   BILITOT 0.39 09/25/2017 0855   GFRNONAA >60 03/19/2018 0738   GFRAA >60 03/19/2018 0738       Component Value Date/Time   WBC 5.2 03/19/2018 0738   WBC 10.8 (H) 02/28/2018 1121   RBC 4.91 03/19/2018 0738   HGB 13.0 03/19/2018 0738   HGB 13.6 09/25/2017 0855   HCT 39.1 03/19/2018 0738   HCT 39.2 09/25/2017 0855   PLT 307 03/19/2018 0738   PLT 239 09/25/2017 0855   MCV 79.6 03/19/2018 0738   MCV 85.8 09/25/2017 0855   MCH 26.5 (L) 03/19/2018 0738   MCHC 33.3 03/19/2018 0738   RDW 16.8 (H) 03/19/2018 0738   RDW 14.5 09/25/2017 0855   LYMPHSABS 2.5 03/19/2018 0738   LYMPHSABS 1.8 09/25/2017 0855   MONOABS 0.6 03/19/2018 0738   MONOABS 0.8 09/25/2017 0855   EOSABS 0.1 03/19/2018 0738   EOSABS 0.3 09/25/2017 0855   BASOSABS 0.0 03/19/2018 0738   BASOSABS 0.0 09/25/2017 0855   -------------------------------  Imaging from last 24 hours (if applicable):  Radiology interpretation: [place] Core Biopsy (lymph Nodes)  Result Date: 02/28/2018 INDICATION: History of gastric cancer, now with enlarging right cervical lymphadenopathy. Please perform ultrasound-guided right cervical lymph node biopsy for tissue diagnostic purposes. EXAM: ULTRASOUND-GUIDED RIGHT CERVICAL LYMPH NODE BIOPSY COMPARISON:  Neck CT-01/27/2017; chest CT - 12/27/2017 MEDICATIONS: None ANESTHESIA/SEDATION: Moderate (conscious) sedation was employed during this procedure. A total of Versed 2 mg and Fentanyl 100 mcg was administered intravenously. Moderate Sedation Time: 13 minutes. The patient's level of consciousness and vital signs were monitored continuously by radiology nursing throughout the procedure under my direct supervision. COMPLICATIONS: None immediate. TECHNIQUE: Informed written consent was obtained from the patient after a discussion of the risks, benefits and alternatives to treatment. Questions  regarding the procedure were encouraged and  answered. Initial ultrasound scanning demonstrated and enlarged (at least 3.2 x 1.9 cm) right cervical lymph node (image 5), correlating with the dominant nodal conglomeration seen on preceding contrast-enhanced neck CT image 66, series 2. An ultrasound image was saved for documentation purposes. The procedure was planned. A timeout was performed prior to the initiation of the procedure. The operative was prepped and draped in the usual sterile fashion, and a sterile drape was applied covering the operative field. A timeout was performed prior to the initiation of the procedure. Local anesthesia was provided with 1% lidocaine with epinephrine. Under direct ultrasound guidance, an 18 gauge core needle device was utilized to obtain to obtain 6 core needle biopsies of the dominant indeterminate right cervical lymph node. The samples were placed in saline and submitted to pathology. The needle was removed and hemostasis was achieved with manual compression. Post procedure scan was negative for significant hematoma. A dressing was placed. The patient tolerated the procedure well without immediate postprocedural complication. IMPRESSION: Technically successful ultrasound guided biopsy of dominant dominant indeterminate right cervical lymph node. Electronically Signed   By: Sandi Mariscal M.D.   On: 02/28/2018 13:29        This case was discussed with Dr. Benay Spice. He expressed agreement with my management of this patient.

## 2018-03-19 NOTE — Patient Instructions (Signed)
[facility] Discharge Instructions for Patients Receiving Chemotherapy  Today you received the following chemotherapy agents Taxol, Cyramza To help prevent nausea and vomiting after your treatment, we encourage you to take your nausea medication as directed   If you develop nausea and vomiting that is not controlled by your nausea medication, call the clinic.   BELOW ARE SYMPTOMS THAT SHOULD BE REPORTED IMMEDIATELY:  *FEVER GREATER THAN 100.5 F  *CHILLS WITH OR WITHOUT FEVER  NAUSEA AND VOMITING THAT IS NOT CONTROLLED WITH YOUR NAUSEA MEDICATION  *UNUSUAL SHORTNESS OF BREATH  *UNUSUAL BRUISING OR BLEEDING  TENDERNESS IN MOUTH AND THROAT WITH OR WITHOUT PRESENCE OF ULCERS  *URINARY PROBLEMS  *BOWEL PROBLEMS  UNUSUAL RASH Items with * indicate a potential emergency and should be followed up as soon as possible.  Feel free to call the clinic should you have any questions or concerns. The clinic phone number is [phone].  Please show the [facility] at check-in to the Emergency Department and triage nurse.

## 2018-03-19 NOTE — Progress Notes (Signed)
Per Sandi Mealy, PA okay to tx with HR 115.

## 2018-03-20 ENCOUNTER — Other Ambulatory Visit: Payer: Self-pay | Admitting: Medical

## 2018-03-20 MED ORDER — MAGIC MOUTHWASH: 5.0000 mL | Freq: Four times a day (QID) | ORAL | 0 refills | Status: DC | PRN

## 2018-03-20 MED ORDER — PREDNISONE 1 MG PO TABS: ORAL_TABLET | ORAL | 0 refills | Status: DC

## 2018-03-28 ENCOUNTER — Other Ambulatory Visit: Payer: Self-pay | Admitting: Medical

## 2018-03-28 ENCOUNTER — Telehealth: Payer: Self-pay | Admitting: Medical Oncology

## 2018-03-28 MED ORDER — AZITHROMYCIN 250 MG PO TABS: ORAL_TABLET | ORAL | 0 refills | Status: DC

## 2018-03-28 NOTE — Telephone Encounter (Signed)
A prescription for a Z-Pak was sent to his pharmacy. Sandi Mealy

## 2018-03-28 NOTE — Telephone Encounter (Signed)
Sore throat, hurts to swallow.Per Dr Benay Spice pt may take otc robitussin, chloraseptic spray. I also instructed pt to use salt water gargle /rinses and continue MMW. Pt declined appt tomorrow with Dr Benay Spice.

## 2018-03-29 ENCOUNTER — Other Ambulatory Visit: Payer: Self-pay | Admitting: Oncology

## 2018-04-02 ENCOUNTER — Telehealth: Payer: Self-pay | Admitting: Oncology

## 2018-04-02 ENCOUNTER — Inpatient Hospital Stay (HOSPITAL_BASED_OUTPATIENT_CLINIC_OR_DEPARTMENT_OTHER): Payer: Medicaid Other | Admitting: Oncology

## 2018-04-02 ENCOUNTER — Inpatient Hospital Stay: Payer: Medicaid Other

## 2018-04-02 ENCOUNTER — Encounter: Payer: Self-pay | Admitting: Oncology

## 2018-04-02 ENCOUNTER — Inpatient Hospital Stay: Payer: Medicaid Other | Attending: Oncology

## 2018-04-02 VITALS — BP 152/92 | HR 94 | Temp 98.0°F | Resp 18 | Ht 66.0 in | Wt 166.3 lb

## 2018-04-02 DIAGNOSIS — D630 Anemia in neoplastic disease: Secondary | ICD-10-CM | POA: Diagnosis not present

## 2018-04-02 DIAGNOSIS — G620 Drug-induced polyneuropathy: Secondary | ICD-10-CM

## 2018-04-02 DIAGNOSIS — I10 Essential (primary) hypertension: Secondary | ICD-10-CM | POA: Insufficient documentation

## 2018-04-02 DIAGNOSIS — Z95828 Presence of other vascular implants and grafts: Secondary | ICD-10-CM

## 2018-04-02 DIAGNOSIS — C160 Malignant neoplasm of cardia: Secondary | ICD-10-CM

## 2018-04-02 DIAGNOSIS — Z5112 Encounter for antineoplastic immunotherapy: Secondary | ICD-10-CM | POA: Diagnosis present

## 2018-04-02 DIAGNOSIS — Z87311 Personal history of (healed) other pathological fracture: Secondary | ICD-10-CM | POA: Insufficient documentation

## 2018-04-02 DIAGNOSIS — C770 Secondary and unspecified malignant neoplasm of lymph nodes of head, face and neck: Secondary | ICD-10-CM

## 2018-04-02 DIAGNOSIS — E119 Type 2 diabetes mellitus without complications: Secondary | ICD-10-CM

## 2018-04-02 DIAGNOSIS — Z5111 Encounter for antineoplastic chemotherapy: Secondary | ICD-10-CM | POA: Diagnosis not present

## 2018-04-02 LAB — CBC WITH DIFFERENTIAL (CANCER CENTER ONLY)
BASOS ABS: 0.1 10*3/uL (ref 0.0–0.1)
Basophils Relative: 1 %
EOS ABS: 0.1 10*3/uL (ref 0.0–0.5)
EOS PCT: 2 %
HCT: 35.1 % — ABNORMAL LOW (ref 38.4–49.9)
HEMOGLOBIN: 11.4 g/dL — AB (ref 13.0–17.1)
LYMPHS ABS: 1.5 10*3/uL (ref 0.9–3.3)
LYMPHS PCT: 23 %
MCH: 26.5 pg — AB (ref 27.2–33.4)
MCHC: 32.5 g/dL (ref 32.0–36.0)
MCV: 81.6 fL (ref 79.3–98.0)
Monocytes Absolute: 0.6 10*3/uL (ref 0.1–0.9)
Monocytes Relative: 8 %
Neutro Abs: 4.5 10*3/uL (ref 1.5–6.5)
Neutrophils Relative %: 66 %
PLATELETS: 254 10*3/uL (ref 140–400)
RBC: 4.3 MIL/uL (ref 4.20–5.82)
RDW: 15.7 % — ABNORMAL HIGH (ref 11.0–14.6)
WBC Count: 6.8 10*3/uL (ref 4.0–10.3)

## 2018-04-02 LAB — CMP (CANCER CENTER ONLY)
ALBUMIN: 3.7 g/dL (ref 3.5–5.0)
ALK PHOS: 90 U/L (ref 38–126)
ALT: 16 U/L (ref 0–44)
ANION GAP: 9 (ref 5–15)
AST: 13 U/L — ABNORMAL LOW (ref 15–41)
BUN: 12 mg/dL (ref 6–20)
CALCIUM: 9.2 mg/dL (ref 8.9–10.3)
CO2: 24 mmol/L (ref 22–32)
CREATININE: 0.78 mg/dL (ref 0.61–1.24)
Chloride: 109 mmol/L (ref 98–111)
GFR, Est AFR Am: >60 mL/min (ref >60)
GFR, Estimated: >60 mL/min (ref >60)
GLUCOSE: 122 mg/dL — AB (ref 70–99)
Potassium: 3.8 mmol/L (ref 3.5–5.1)
SODIUM: 142 mmol/L (ref 135–145)
Total Bilirubin: 0.2 mg/dL — ABNORMAL LOW (ref 0.3–1.2)
Total Protein: 7 g/dL (ref 6.5–8.1)

## 2018-04-02 LAB — TOTAL PROTEIN, URINE DIPSTICK: Protein, ur: NEGATIVE mg/dL

## 2018-04-02 MED ORDER — DIPHENHYDRAMINE HCL 50 MG/ML IJ SOLN
25.0000 mg | Freq: Once | INTRAMUSCULAR | Status: AC
  Administered 2018-04-02: 25 mg via INTRAVENOUS

## 2018-04-02 MED ORDER — SODIUM CHLORIDE 0.9% FLUSH
10.0000 mL | INTRAVENOUS | Status: DC | PRN
  Administered 2018-04-02: 10 mL via INTRAVENOUS
  Filled 2018-04-02: qty 10

## 2018-04-02 MED ORDER — SODIUM CHLORIDE 0.9 % IV SOLN
Freq: Once | INTRAVENOUS | Status: AC
Start: 2018-04-02 — End: 2018-04-02
  Administered 2018-04-02: 13:00:00 via INTRAVENOUS

## 2018-04-02 MED ORDER — HEPARIN SOD (PORK) LOCK FLUSH 100 UNIT/ML IV SOLN
500.0000 [IU] | Freq: Once | INTRAVENOUS | Status: AC | PRN
  Administered 2018-04-02: 500 [IU]
  Filled 2018-04-02: qty 5

## 2018-04-02 MED ORDER — FAMOTIDINE IN NACL 20-0.9 MG/50ML-% IV SOLN
20.0000 mg | Freq: Once | INTRAVENOUS | Status: AC
  Administered 2018-04-02: 20 mg via INTRAVENOUS

## 2018-04-02 MED ORDER — SODIUM CHLORIDE 0.9% FLUSH
10.0000 mL | INTRAVENOUS | Status: DC | PRN
  Administered 2018-04-02: 10 mL
  Filled 2018-04-02: qty 10

## 2018-04-02 MED ORDER — DIPHENHYDRAMINE HCL 50 MG/ML IJ SOLN
INTRAMUSCULAR | Status: AC
  Filled 2018-04-02: qty 1

## 2018-04-02 MED ORDER — FAMOTIDINE IN NACL 20-0.9 MG/50ML-% IV SOLN
INTRAVENOUS | Status: AC
  Filled 2018-04-02: qty 50

## 2018-04-02 MED ORDER — SODIUM CHLORIDE 0.9 % IV SOLN
8.0000 mg/kg | Freq: Once | INTRAVENOUS | Status: AC
  Administered 2018-04-02: 600 mg via INTRAVENOUS
  Filled 2018-04-02: qty 10

## 2018-04-02 MED ORDER — DEXAMETHASONE SODIUM PHOSPHATE 10 MG/ML IJ SOLN
10.0000 mg | Freq: Once | INTRAMUSCULAR | Status: AC
  Administered 2018-04-02: 10 mg via INTRAVENOUS

## 2018-04-02 MED ORDER — DEXAMETHASONE SODIUM PHOSPHATE 10 MG/ML IJ SOLN
INTRAMUSCULAR | Status: AC
Start: 2018-04-02 — End: ?
  Filled 2018-04-02: qty 1

## 2018-04-02 MED ORDER — SODIUM CHLORIDE 0.9 % IV SOLN
80.0000 mg/m2 | Freq: Once | INTRAVENOUS | Status: AC
  Administered 2018-04-02: 150 mg via INTRAVENOUS
  Filled 2018-04-02: qty 25

## 2018-04-02 MED ORDER — ACETAMINOPHEN 325 MG PO TABS
650.0000 mg | ORAL_TABLET | Freq: Once | ORAL | Status: AC
  Administered 2018-04-02: 650 mg via ORAL

## 2018-04-02 MED ORDER — ACETAMINOPHEN 325 MG PO TABS
ORAL_TABLET | ORAL | Status: AC
  Filled 2018-04-02: qty 2

## 2018-04-02 NOTE — Progress Notes (Signed)
[facility] OFFICE PROGRESS NOTE   Diagnosis: Gastric cancer  INTERVAL HISTORY:   Robert Beck returns as scheduled.  He has completed 1 cycle of Taxol/ramucirumab.  No symptom of an allergic reaction.  He had a minor nosebleed.  Slight worsening of neuropathy symptoms in the feet. He developed a sore throat following week 2 of Taxol.  He also reports ulcer at the left buccal mucosa.  The sore throat worsened after he worked in his yard and burned trash. He was seen in the symptom management clinic on 03/19/2018.  Exam of the oropharynx was unremarkable.  He was prescribed Magic mouthwash.  The sore throat has resolved. He has noted improvement in the pain/tenderness of the right lower neck.  Objective:  Vital signs in last 24 hours:  Blood pressure (!) 152/92, pulse 94, temperature 98 F (36.7 C), temperature source Oral, resp. rate 18, height _0  (1.676 m), weight 166 lb 4.8 oz (75.4 kg), SpO2 100 %.    HEENT: No thrush or ulcers.  Pharynx without erythema or exudate Lymphatics: Slight fullness in the right lower cervical area and medial supraclavicular fossa without a discrete mass Resp: Lungs clear bilaterally Cardio: Regular rate and rhythm GI: No hepatomegaly, nontender Vascular: No leg edema     Portacath/PICC-without erythema  Lab Results:  Lab Results  Component Value Date   WBC 6.8 04/02/2018   HGB 11.4 (L) 04/02/2018   HCT 35.1 (L) 04/02/2018   MCV 81.6 04/02/2018   PLT 254 04/02/2018   NEUTROABS 4.5 04/02/2018    CMP  Lab Results  Component Value Date   NA 144 03/19/2018   K 3.6 03/19/2018   CL 109 03/19/2018   CO2 23 03/19/2018   GLUCOSE 92 03/19/2018   BUN 12 03/19/2018   CREATININE 0.79 03/19/2018   CALCIUM 10.7 (H) 03/19/2018   PROT 8.0 03/19/2018   ALBUMIN 3.8 03/19/2018   AST 14 (L) 03/19/2018   ALT 18 03/19/2018   ALKPHOS 105 03/19/2018   BILITOT 0.3 03/19/2018   GFRNONAA >60 03/19/2018   GFRAA >60 03/19/2018    Lab  Results  Component Value Date   CEA1 3.83 06/27/2016    Lab Results  Component Value Date   INR 0.99 02/28/2018    Imaging:  No results found.  Medications: I have reviewed the patient's current medications.   Assessment/Plan: 1. 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 Decreased gastric cardia wall thickening.  Cycle 7 FOLFOX 07/25/2016  Cycle 8 FOLFOX 08/08/2016  Cycle 9 FOLFOX 08/22/2016 (oxaliplatin held due to neuropathy)  Cycle 10 FOLFOX 09/05/2016 (oxaliplatin held)  Cycle 11 FOLFOX 09/26/2016 (oxaliplatin held)  Cycle 12 FOLFOX 10/10/2016 (oxaliplatin held)  CT 10/18/2016-mild enlargement of mediastinal lymph nodes, no other evidence of disease progression, stable thickening at the GE junction and stable gastrohepatic lymphadenopathy  Cycle 13 FOLFOX 10/24/2016 (oxaliplatin held)  Maintenance Xeloda 7 days on/7 days off beginning 11/17/2016  Restaging chest CT 01/22/2017-interval progression  of metastatic lymphadenopathy in the mediastinum and gastrohepatic ligament.  Salvage therapy with FOLFIRI initiated 02/20/2017  Cycle 5 FOLFIRI 04/17/2017  Restaging chest CT 04/29/2017-mild improvement in mediastinal adenopathy. No significant change in adenopathy in  the gastrohepatic ligament. No evidence of disease progression.  Cycle 6 FOLFIRI 05/01/2017  Cycle 7 FOLFIRI 05/15/2017  Cycle 8 FOLFIRI 05/29/2017  Cycle 9 FOLFIRI 06/19/2017  Cycle 10 FOLFIRI 07/03/2017  Cycle 11 FOLFIRI 07/17/2017  CT chest 07/30/2017-mild decrease in mediastinal and gastrohepatic ligament lymphadenopathy, no evidence of progressive disease  Cycle 12 FOLFIRI 07/31/2017  Cycle 13 FOLFIRI 08/14/2017  Cycle 14 FOLFIRI 09/04/2017  Cycle 15 FOLFIRI 09/25/2017  Cycle 16 FOLFIRI 10/16/2017  Cycle 17 FOLFIRI 11/06/2017  Cycle 18 FOLFIRI 11/27/2017  cycle 19 FOLFIRI 12/18/2017  CT 01/06/2018-stable neck/mediastinal nodes, slight enlargement of gastrohepatic ligament node stable periportal and portacaval nodes  Cycle 20 FOLFIRI 01/08/2018  Cycle 21 FOLFIRI 01/29/2018  CT neck 02/06/2018- 4 cm necrotic right levelIII/IVlymph node similar to the chest CT 01/06/2018, considerably larger than on 07/30/2017. Unchanged adjacent right level IV/supraclavicular lymph node and partially visualized mediastinal lymph nodes.  Cycle 1 Taxol/ramucirumab 03/05/2018  Core biopsy right cervical lymph node 02/28/2018-metastatic adenocarcinoma, HER-2 negative PDL 1 score-60, MSI-stable, tumor mutational burden-10  Cycle 2 Taxol/ramucirumab 17 2019  2. Anemia secondary to #1-improved  3. Diabetes  4. Gout  5. Hypertension  6. Hyperlipidemia  7. History of a T7 compression fracture  8. Pain secondary to the gastric mass and mediastinal lymphadenopathy-resolved  9. Port-A-Cath placement 04/20/2016  10. 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 1 mm obstructing stone distal right ureter/right ureteral vesicle junction on CT 09/13/2016.  13. 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 Hypercalcemia 12/19/2016-asymptomatic  15. Nausea following FOLFIRI-emend added beginning with cycle 3, trial of Reglan beginning12/08/2017, persistent  16.Erythematous, mildly indurated rash right upper posterior leg in setting of possible recent tick bitestatus post course of doxycycline.  Resolved.  17.  Sore throat/left buccal ulcer following cycle 1 Taxol/ramucirumab   Disposition: Robert Beck appears well.  He has completed 1 cycle of Taxol/ramucirumab.  It is unclear whether the sore throat and buccal ulcer were related to treatment.  He will contact us for recurrent symptoms following this cycle.  The right neck mass appears smaller on exam today.  He will complete cycle 2 Taxol/ramucirumab today.  The PDL 1 score returned at 60 on the right neck lymph node biopsy.  He will be a candidate for pembrolizumab in the future.  He will return for an office visit in 1 week.    Betsy Coder, MD  04/02/2018  10:49 AM

## 2018-04-02 NOTE — Telephone Encounter (Signed)
Scheduled appt per 7/10 los - gave patient calender per los.

## 2018-04-06 ENCOUNTER — Other Ambulatory Visit: Payer: Self-pay | Admitting: Oncology

## 2018-04-09 ENCOUNTER — Inpatient Hospital Stay (HOSPITAL_BASED_OUTPATIENT_CLINIC_OR_DEPARTMENT_OTHER): Payer: Medicaid Other | Admitting: Oncology

## 2018-04-09 ENCOUNTER — Inpatient Hospital Stay: Payer: Medicaid Other

## 2018-04-09 ENCOUNTER — Encounter: Payer: Self-pay | Admitting: Oncology

## 2018-04-09 VITALS — BP 125/88 | HR 98 | Resp 20 | Ht 66.0 in | Wt 163.4 lb

## 2018-04-09 DIAGNOSIS — C160 Malignant neoplasm of cardia: Secondary | ICD-10-CM

## 2018-04-09 DIAGNOSIS — C770 Secondary and unspecified malignant neoplasm of lymph nodes of head, face and neck: Secondary | ICD-10-CM

## 2018-04-09 DIAGNOSIS — Z87311 Personal history of (healed) other pathological fracture: Secondary | ICD-10-CM

## 2018-04-09 DIAGNOSIS — E119 Type 2 diabetes mellitus without complications: Secondary | ICD-10-CM

## 2018-04-09 DIAGNOSIS — D630 Anemia in neoplastic disease: Secondary | ICD-10-CM

## 2018-04-09 DIAGNOSIS — I10 Essential (primary) hypertension: Secondary | ICD-10-CM

## 2018-04-09 DIAGNOSIS — Z95828 Presence of other vascular implants and grafts: Secondary | ICD-10-CM

## 2018-04-09 DIAGNOSIS — G620 Drug-induced polyneuropathy: Secondary | ICD-10-CM

## 2018-04-09 LAB — CBC WITH DIFFERENTIAL (CANCER CENTER ONLY)
BASOS ABS: 0.1 10*3/uL (ref 0.0–0.1)
Basophils Relative: 1 %
Eosinophils Absolute: 0.2 10*3/uL (ref 0.0–0.5)
Eosinophils Relative: 2 %
HEMATOCRIT: 37.1 % — AB (ref 38.4–49.9)
HEMOGLOBIN: 12.4 g/dL — AB (ref 13.0–17.1)
LYMPHS PCT: 34 %
Lymphs Abs: 3 10*3/uL (ref 0.9–3.3)
MCH: 26.6 pg — ABNORMAL LOW (ref 27.2–33.4)
MCHC: 33.4 g/dL (ref 32.0–36.0)
MCV: 79.5 fL (ref 79.3–98.0)
Monocytes Absolute: 0.3 10*3/uL (ref 0.1–0.9)
Monocytes Relative: 4 %
NEUTROS ABS: 5.3 10*3/uL (ref 1.5–6.5)
Neutrophils Relative %: 59 %
Platelet Count: 339 10*3/uL (ref 140–400)
RBC: 4.67 MIL/uL (ref 4.20–5.82)
RDW: 17.7 % — ABNORMAL HIGH (ref 11.0–14.6)
WBC Count: 8.9 10*3/uL (ref 4.0–10.3)

## 2018-04-09 MED ORDER — SODIUM CHLORIDE 0.9% FLUSH
10.0000 mL | INTRAVENOUS | Status: DC | PRN
  Administered 2018-04-09: 10 mL via INTRAVENOUS
  Filled 2018-04-09: qty 10

## 2018-04-09 MED ORDER — HEPARIN SOD (PORK) LOCK FLUSH 100 UNIT/ML IV SOLN
500.0000 [IU] | Freq: Once | INTRAVENOUS | Status: DC | PRN
  Filled 2018-04-09: qty 5

## 2018-04-09 MED ORDER — DIPHENHYDRAMINE HCL 50 MG/ML IJ SOLN
25.0000 mg | Freq: Once | INTRAMUSCULAR | Status: AC
  Administered 2018-04-09: 25 mg via INTRAVENOUS

## 2018-04-09 MED ORDER — SODIUM CHLORIDE 0.9% FLUSH
10.0000 mL | INTRAVENOUS | Status: DC | PRN
  Administered 2018-04-09: 10 mL
  Filled 2018-04-09: qty 10

## 2018-04-09 MED ORDER — DEXAMETHASONE SODIUM PHOSPHATE 10 MG/ML IJ SOLN
INTRAMUSCULAR | Status: AC
  Filled 2018-04-09: qty 1

## 2018-04-09 MED ORDER — FAMOTIDINE IN NACL 20-0.9 MG/50ML-% IV SOLN
20.0000 mg | Freq: Once | INTRAVENOUS | Status: AC
  Administered 2018-04-09: 20 mg via INTRAVENOUS

## 2018-04-09 MED ORDER — SODIUM CHLORIDE 0.9 % IV SOLN
80.0000 mg/m2 | Freq: Once | INTRAVENOUS | Status: AC
  Administered 2018-04-09: 150 mg via INTRAVENOUS
  Filled 2018-04-09: qty 25

## 2018-04-09 MED ORDER — FAMOTIDINE IN NACL 20-0.9 MG/50ML-% IV SOLN
INTRAVENOUS | Status: AC
  Filled 2018-04-09: qty 50

## 2018-04-09 MED ORDER — DEXAMETHASONE SODIUM PHOSPHATE 10 MG/ML IJ SOLN
10.0000 mg | Freq: Once | INTRAMUSCULAR | Status: AC
  Administered 2018-04-09: 10 mg via INTRAVENOUS

## 2018-04-09 MED ORDER — DIPHENHYDRAMINE HCL 50 MG/ML IJ SOLN
INTRAMUSCULAR | Status: AC
  Filled 2018-04-09: qty 1

## 2018-04-09 MED ORDER — SODIUM CHLORIDE 0.9 % IV SOLN
Freq: Once | INTRAVENOUS | Status: AC
  Administered 2018-04-09: 09:00:00 via INTRAVENOUS

## 2018-04-09 NOTE — Progress Notes (Signed)
[facility] OFFICE PROGRESS NOTE   Diagnosis: Gastric cancer  INTERVAL HISTORY:   Robert Beck pleaded another treatment with Taxol and ramucirumab on 04/02/2018.  He tolerated the treatment well.  No symptom of allergic reaction.  No sore throat.  No change in baseline neuropathy symptoms.  No dysphasia.  He feels the right neck mass is smaller.  Objective:  Vital signs in last 24 hours:  Blood pressure 125/88, pulse 98, resp. rate 20, height '5\' 6"'  (1.676 m), weight 163 lb 6.4 oz (74.1 kg), SpO2 99 %.    HEENT: Thrush or ulcers Lymphatics: No mass in the right lower neck Resp: Lungs clear bilaterally Cardio: Regular rate and rhythm GI: No hepatomegaly, nontender Vascular: No leg edema  Portacath/PICC-without erythema  Lab Results:  Lab Results  Component Value Date   WBC 8.9 04/09/2018   HGB 12.4 (L) 04/09/2018   HCT 37.1 (L) 04/09/2018   MCV 79.5 04/09/2018   PLT 339 04/09/2018   NEUTROABS 5.3 04/09/2018    CMP  Lab Results  Component Value Date   NA 142 04/02/2018   K 3.8 04/02/2018   CL 109 04/02/2018   CO2 24 04/02/2018   GLUCOSE 122 (H) 04/02/2018   BUN 12 04/02/2018   CREATININE 0.78 04/02/2018   CALCIUM 9.2 04/02/2018   PROT 7.0 04/02/2018   ALBUMIN 3.7 04/02/2018   AST 13 (L) 04/02/2018   ALT 16 04/02/2018   ALKPHOS 90 04/02/2018   BILITOT 0.2 (L) 04/02/2018   GFRNONAA >60 04/02/2018   GFRAA >60 04/02/2018    Lab Results  Component Value Date   CEA1 3.83 06/27/2016     Medications: I have reviewed the patient's current medications.   Assessment/Plan: 1. Gastric cancer, gastric cardia mass extendingto the GE junction confirmed on endoscopy 04/11/2016, biopsy confirmed adenocarcinoma  Staging CTs of the chest, abdomen, and pelvis 04/09/2016-gastric cardia mass, mediastinal, right hilar, and right supraclavicular lymphadenopathy  Biopsy of a right supraclavicular lymph node on 04/20/2016 revealed metastatic  adenocarcinoma  FoundationACT 02/20/2017-TP53 alteration identified  Cycle 1 FOLFOX 04/30/2016 (oxaliplatin given 04/30/2016, 5-FU infusion started 05/02/2016)  Cycle 2 FOLFOX 05/14/2016  Cycle 3 FOLFOX 05/30/2016  Cycle 4 FOLFOX 06/13/2016  Cycle 5 FOLFOX 06/27/2016  Cycle 6 FOLFOX 07/11/2016  CTs 07/20/2016-decrease in supraclavicular, mediastinal, and gastrohepatic adenopathy. Decreased gastric cardia wall thickening.  Cycle 7 FOLFOX 07/25/2016  Cycle 8 FOLFOX 08/08/2016  Cycle 9 FOLFOX 08/22/2016 (oxaliplatin held due to neuropathy)  Cycle 10 FOLFOX 09/05/2016 (oxaliplatin held)  Cycle 11 FOLFOX 09/26/2016 (oxaliplatin held)  Cycle 12 FOLFOX 10/10/2016 (oxaliplatin held)  CT 10/18/2016-mild enlargement of mediastinal lymph nodes, no other evidence of disease progression, stable thickening at the GE junction and stable gastrohepatic lymphadenopathy  Cycle 13 FOLFOX 10/24/2016 (oxaliplatin held)  Maintenance Xeloda 7 days on/7 days off beginning 11/17/2016  Restaging chest CT 01/22/2017-interval progression of metastatic lymphadenopathy in the mediastinum and gastrohepatic ligament.  Salvage therapy with FOLFIRI initiated 02/20/2017  Cycle 5 FOLFIRI 04/17/2017  Restaging chest CT 04/29/2017-mild improvement in mediastinal adenopathy. No significant change in adenopathy in the gastrohepatic ligament. No evidence of disease progression.  Cycle 6 FOLFIRI 05/01/2017  Cycle 7 FOLFIRI 05/15/2017  Cycle 8 FOLFIRI 05/29/2017  Cycle 9 FOLFIRI 06/19/2017  Cycle 10 FOLFIRI 07/03/2017  Cycle 11 FOLFIRI 07/17/2017  CT chest 07/30/2017-mild decrease in mediastinal and gastrohepatic ligament lymphadenopathy, no evidence of progressive disease  Cycle 12 FOLFIRI 07/31/2017  Cycle 13 FOLFIRI 08/14/2017  Cycle 14 FOLFIRI 09/04/2017  Cycle 15 FOLFIRI 09/25/2017  Cycle 16 FOLFIRI 10/16/2017  Cycle 17 FOLFIRI 11/06/2017  Cycle 18 FOLFIRI 11/27/2017  cycle 19 FOLFIRI  12/18/2017  CT 01/06/2018-stable neck/mediastinal nodes, slight enlargement of gastrohepatic ligament node stable periportal and portacaval nodes  Cycle 20 FOLFIRI 01/08/2018  Cycle 21 FOLFIRI 01/29/2018  CT neck 02/06/2018- 4 cm necrotic right levelIII/IVlymph node similar to the chest CT 01/06/2018, considerably larger than on 07/30/2017. Unchanged adjacent right level IV/supraclavicular lymph node and partially visualized mediastinal lymph nodes.  Cycle 1 Taxol/ramucirumab 03/05/2018  Core biopsy right cervical lymph node 02/28/2018-metastatic adenocarcinoma, HER-2 negative PDL 1 score-60, MSI-stable, tumor mutational burden-10  Cycle 2 Taxol/ramucirumab 04/02/2018  2. Anemia secondary to #1-improved  3. Diabetes  4. Gout  5. Hypertension  6. Hyperlipidemia  7. History of a T7 compression fracture  8. Pain secondary to the gastric mass and mediastinal lymphadenopathy-resolved  9. Port-A-Cath placement 04/20/2016  10. 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 1 mm obstructing stone distal right ureter/right ureteral vesicle junction on CT 09/13/2016.  13. 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 Hypercalcemia 12/19/2016-asymptomatic  15. Nausea following FOLFIRI-emend added beginning with cycle 3, trial of Reglan beginning12/08/2017, persistent  16.Erythematous, mildly indurated rash right upper posterior leg in setting of possible recent tick bitestatus post course of doxycycline.Resolved.  17.  Sore throat/left buccal ulcer following cycle 1 Taxol/ramucirumab   Disposition: Robert Beck tolerated day 1 of cycle 2 chemotherapy well.  The right neck mass appears to be responding to the Taxol/ramucirumab.  He will complete day 8 cycle 2 Taxol  today.  Robert Beck will return for an office visit on day 1 of cycle 3.  The plan is to schedule a restaging CT after cycle 3.  15 minutes were spent with the patient today.  The majority of the time was used for counseling and coordination of care.  Betsy Coder, MD  04/09/2018  9:01 AM

## 2018-04-09 NOTE — Progress Notes (Signed)
Ok to treat with CMP from 04/02/18 per Dr. Benay Spice

## 2018-04-09 NOTE — Patient Instructions (Signed)
[facility] Discharge Instructions for Patients Receiving Chemotherapy  Today you received the following chemotherapy agents taxol  To help prevent nausea and vomiting after your treatment, we encourage you to take your nausea medication as directed   If you develop nausea and vomiting that is not controlled by your nausea medication, call the clinic.   BELOW ARE SYMPTOMS THAT SHOULD BE REPORTED IMMEDIATELY:  *FEVER GREATER THAN 100.5 F  *CHILLS WITH OR WITHOUT FEVER  NAUSEA AND VOMITING THAT IS NOT CONTROLLED WITH YOUR NAUSEA MEDICATION  *UNUSUAL SHORTNESS OF BREATH  *UNUSUAL BRUISING OR BLEEDING  TENDERNESS IN MOUTH AND THROAT WITH OR WITHOUT PRESENCE OF ULCERS  *URINARY PROBLEMS  *BOWEL PROBLEMS  UNUSUAL RASH Items with * indicate a potential emergency and should be followed up as soon as possible.  Feel free to call the clinic you have any questions or concerns. The clinic phone number is [phone].  

## 2018-04-16 ENCOUNTER — Inpatient Hospital Stay: Payer: Medicaid Other

## 2018-04-16 VITALS — BP 157/92 | HR 93 | Temp 97.6°F | Resp 18 | Ht 66.0 in | Wt 166.8 lb

## 2018-04-16 DIAGNOSIS — C160 Malignant neoplasm of cardia: Secondary | ICD-10-CM

## 2018-04-16 DIAGNOSIS — Z95828 Presence of other vascular implants and grafts: Secondary | ICD-10-CM

## 2018-04-16 LAB — CBC WITH DIFFERENTIAL (CANCER CENTER ONLY)
BASOS ABS: 0 10*3/uL (ref 0.0–0.1)
BASOS PCT: 1 %
Eosinophils Absolute: 0.2 10*3/uL (ref 0.0–0.5)
Eosinophils Relative: 3 %
HCT: 36.5 % — ABNORMAL LOW (ref 38.4–49.9)
HEMOGLOBIN: 12 g/dL — AB (ref 13.0–17.1)
LYMPHS ABS: 3 10*3/uL (ref 0.9–3.3)
LYMPHS PCT: 49 %
MCH: 27 pg — ABNORMAL LOW (ref 27.2–33.4)
MCHC: 32.9 g/dL (ref 32.0–36.0)
MCV: 82.2 fL (ref 79.3–98.0)
Monocytes Absolute: 0.3 10*3/uL (ref 0.1–0.9)
Monocytes Relative: 5 %
NEUTROS ABS: 2.6 10*3/uL (ref 1.5–6.5)
NEUTROS PCT: 42 %
PLATELETS: 237 10*3/uL (ref 140–400)
RBC: 4.44 MIL/uL (ref 4.20–5.82)
RDW: 16.6 % — ABNORMAL HIGH (ref 11.0–14.6)
WBC: 6.1 10*3/uL (ref 4.0–10.3)

## 2018-04-16 LAB — CMP (CANCER CENTER ONLY)
ALBUMIN: 3.9 g/dL (ref 3.5–5.0)
ALK PHOS: 86 U/L (ref 38–126)
ALT: 14 U/L (ref 0–44)
AST: 13 U/L — ABNORMAL LOW (ref 15–41)
Anion gap: 11 (ref 5–15)
BUN: 8 mg/dL (ref 6–20)
CALCIUM: 9.7 mg/dL (ref 8.9–10.3)
CHLORIDE: 110 mmol/L (ref 98–111)
CO2: 22 mmol/L (ref 22–32)
CREATININE: 0.79 mg/dL (ref 0.61–1.24)
GFR, Est AFR Am: >60 mL/min (ref >60)
GFR, Estimated: >60 mL/min (ref >60)
GLUCOSE: 78 mg/dL (ref 70–99)
Potassium: 3.5 mmol/L (ref 3.5–5.1)
Sodium: 143 mmol/L (ref 135–145)
Total Bilirubin: 0.3 mg/dL (ref 0.3–1.2)
Total Protein: 7.1 g/dL (ref 6.5–8.1)

## 2018-04-16 MED ORDER — FAMOTIDINE IN NACL 20-0.9 MG/50ML-% IV SOLN
INTRAVENOUS | Status: AC
  Filled 2018-04-16: qty 50

## 2018-04-16 MED ORDER — DIPHENHYDRAMINE HCL 50 MG/ML IJ SOLN
INTRAMUSCULAR | Status: AC
  Filled 2018-04-16: qty 1

## 2018-04-16 MED ORDER — ACETAMINOPHEN 325 MG PO TABS
650.0000 mg | ORAL_TABLET | Freq: Once | ORAL | Status: AC
  Administered 2018-04-16: 650 mg via ORAL

## 2018-04-16 MED ORDER — DEXAMETHASONE SODIUM PHOSPHATE 10 MG/ML IJ SOLN
INTRAMUSCULAR | Status: AC
  Filled 2018-04-16: qty 1

## 2018-04-16 MED ORDER — HEPARIN SOD (PORK) LOCK FLUSH 100 UNIT/ML IV SOLN
500.0000 [IU] | Freq: Once | INTRAVENOUS | Status: AC | PRN
  Administered 2018-04-16: 500 [IU]
  Filled 2018-04-16: qty 5

## 2018-04-16 MED ORDER — PACLITAXEL CHEMO INJECTION 300 MG/50ML
80.0000 mg/m2 | Freq: Once | INTRAVENOUS | Status: AC
  Administered 2018-04-16: 150 mg via INTRAVENOUS
  Filled 2018-04-16: qty 25

## 2018-04-16 MED ORDER — SODIUM CHLORIDE 0.9% FLUSH
10.0000 mL | INTRAVENOUS | Status: DC | PRN
  Administered 2018-04-16: 10 mL
  Filled 2018-04-16: qty 10

## 2018-04-16 MED ORDER — ACETAMINOPHEN 325 MG PO TABS
ORAL_TABLET | ORAL | Status: AC
  Filled 2018-04-16: qty 2

## 2018-04-16 MED ORDER — SODIUM CHLORIDE 0.9 % IV SOLN
8.0000 mg/kg | Freq: Once | INTRAVENOUS | Status: AC
  Administered 2018-04-16: 600 mg via INTRAVENOUS
  Filled 2018-04-16: qty 50

## 2018-04-16 MED ORDER — DEXAMETHASONE SODIUM PHOSPHATE 10 MG/ML IJ SOLN
10.0000 mg | Freq: Once | INTRAMUSCULAR | Status: AC
  Administered 2018-04-16: 10 mg via INTRAVENOUS

## 2018-04-16 MED ORDER — SODIUM CHLORIDE 0.9% FLUSH
10.0000 mL | INTRAVENOUS | Status: DC | PRN
  Administered 2018-04-16: 10 mL via INTRAVENOUS
  Filled 2018-04-16: qty 10

## 2018-04-16 MED ORDER — SODIUM CHLORIDE 0.9 % IV SOLN
Freq: Once | INTRAVENOUS | Status: AC
  Administered 2018-04-16: 09:00:00 via INTRAVENOUS

## 2018-04-16 MED ORDER — FAMOTIDINE IN NACL 20-0.9 MG/50ML-% IV SOLN
20.0000 mg | Freq: Once | INTRAVENOUS | Status: AC
  Administered 2018-04-16: 20 mg via INTRAVENOUS

## 2018-04-16 MED ORDER — DIPHENHYDRAMINE HCL 50 MG/ML IJ SOLN
25.0000 mg | Freq: Once | INTRAMUSCULAR | Status: AC
  Administered 2018-04-16: 25 mg via INTRAVENOUS

## 2018-04-16 NOTE — Patient Instructions (Signed)
[facility] Discharge Instructions for Patients Receiving Chemotherapy  Today you received the following chemotherapy agents :  Cyramza,  Taxol.  To help prevent nausea and vomiting after your treatment, we encourage you to take your nausea medication as prescribed.   If you develop nausea and vomiting that is not controlled by your nausea medication, call the clinic.   BELOW ARE SYMPTOMS THAT SHOULD BE REPORTED IMMEDIATELY:  *FEVER GREATER THAN 100.5 F  *CHILLS WITH OR WITHOUT FEVER  NAUSEA AND VOMITING THAT IS NOT CONTROLLED WITH YOUR NAUSEA MEDICATION  *UNUSUAL SHORTNESS OF BREATH  *UNUSUAL BRUISING OR BLEEDING  TENDERNESS IN MOUTH AND THROAT WITH OR WITHOUT PRESENCE OF ULCERS  *URINARY PROBLEMS  *BOWEL PROBLEMS  UNUSUAL RASH Items with * indicate a potential emergency and should be followed up as soon as possible.  Feel free to call the clinic should you have any questions or concerns. The clinic phone number is [phone].  Please show the CHEMO ALERT CARD at check-in to the Emergency Department and triage nurse.   

## 2018-04-16 NOTE — Progress Notes (Signed)
Per Dr. Benay Spice ok to treat today without urine protein or TSH completed today.

## 2018-04-26 ENCOUNTER — Other Ambulatory Visit: Payer: Self-pay | Admitting: Oncology

## 2018-04-30 ENCOUNTER — Inpatient Hospital Stay: Payer: Medicaid Other | Attending: Oncology

## 2018-04-30 ENCOUNTER — Inpatient Hospital Stay: Payer: Medicaid Other

## 2018-04-30 ENCOUNTER — Inpatient Hospital Stay (HOSPITAL_BASED_OUTPATIENT_CLINIC_OR_DEPARTMENT_OTHER): Payer: Medicaid Other | Admitting: Oncology

## 2018-04-30 ENCOUNTER — Telehealth: Payer: Self-pay | Admitting: Oncology

## 2018-04-30 VITALS — BP 139/87 | HR 80

## 2018-04-30 VITALS — BP 157/100 | HR 97 | Temp 97.8°F | Resp 16 | Ht 66.0 in | Wt 165.0 lb

## 2018-04-30 DIAGNOSIS — C770 Secondary and unspecified malignant neoplasm of lymph nodes of head, face and neck: Secondary | ICD-10-CM | POA: Insufficient documentation

## 2018-04-30 DIAGNOSIS — C160 Malignant neoplasm of cardia: Secondary | ICD-10-CM

## 2018-04-30 DIAGNOSIS — G620 Drug-induced polyneuropathy: Secondary | ICD-10-CM | POA: Diagnosis not present

## 2018-04-30 DIAGNOSIS — Z5112 Encounter for antineoplastic immunotherapy: Secondary | ICD-10-CM | POA: Insufficient documentation

## 2018-04-30 DIAGNOSIS — T451X5A Adverse effect of antineoplastic and immunosuppressive drugs, initial encounter: Secondary | ICD-10-CM

## 2018-04-30 DIAGNOSIS — Z95828 Presence of other vascular implants and grafts: Secondary | ICD-10-CM

## 2018-04-30 DIAGNOSIS — E119 Type 2 diabetes mellitus without complications: Secondary | ICD-10-CM

## 2018-04-30 DIAGNOSIS — Z5111 Encounter for antineoplastic chemotherapy: Secondary | ICD-10-CM | POA: Insufficient documentation

## 2018-04-30 DIAGNOSIS — D630 Anemia in neoplastic disease: Secondary | ICD-10-CM | POA: Diagnosis not present

## 2018-04-30 DIAGNOSIS — I10 Essential (primary) hypertension: Secondary | ICD-10-CM

## 2018-04-30 DIAGNOSIS — Z87311 Personal history of (healed) other pathological fracture: Secondary | ICD-10-CM

## 2018-04-30 LAB — CMP (CANCER CENTER ONLY)
ALBUMIN: 4 g/dL (ref 3.5–5.0)
ALT: 17 U/L (ref 0–44)
AST: 15 U/L (ref 15–41)
Alkaline Phosphatase: 77 U/L (ref 38–126)
Anion gap: 12 (ref 5–15)
BUN: 10 mg/dL (ref 6–20)
CALCIUM: 9.8 mg/dL (ref 8.9–10.3)
CO2: 21 mmol/L — AB (ref 22–32)
CREATININE: 0.8 mg/dL (ref 0.61–1.24)
Chloride: 111 mmol/L (ref 98–111)
GFR, Est AFR Am: >60 mL/min (ref >60)
GFR, Estimated: >60 mL/min (ref >60)
GLUCOSE: 79 mg/dL (ref 70–99)
Potassium: 3.5 mmol/L (ref 3.5–5.1)
SODIUM: 144 mmol/L (ref 135–145)
Total Bilirubin: 0.4 mg/dL (ref 0.3–1.2)
Total Protein: 7 g/dL (ref 6.5–8.1)

## 2018-04-30 LAB — CBC WITH DIFFERENTIAL (CANCER CENTER ONLY)
BASOS ABS: 0.1 10*3/uL (ref 0.0–0.1)
BASOS PCT: 1 %
EOS ABS: 0.2 10*3/uL (ref 0.0–0.5)
Eosinophils Relative: 3 %
HCT: 39 % (ref 38.4–49.9)
HEMOGLOBIN: 12.8 g/dL — AB (ref 13.0–17.1)
LYMPHS ABS: 2.7 10*3/uL (ref 0.9–3.3)
Lymphocytes Relative: 36 %
MCH: 27.6 pg (ref 27.2–33.4)
MCHC: 32.8 g/dL (ref 32.0–36.0)
MCV: 84.2 fL (ref 79.3–98.0)
Monocytes Absolute: 1.2 10*3/uL — ABNORMAL HIGH (ref 0.1–0.9)
Monocytes Relative: 16 %
NEUTROS PCT: 44 %
Neutro Abs: 3.4 10*3/uL (ref 1.5–6.5)
Platelet Count: 237 10*3/uL (ref 140–400)
RBC: 4.63 MIL/uL (ref 4.20–5.82)
RDW: 18.1 % — ABNORMAL HIGH (ref 11.0–14.6)
WBC: 7.5 10*3/uL (ref 4.0–10.3)

## 2018-04-30 LAB — TOTAL PROTEIN, URINE DIPSTICK: Protein, ur: <30 mg/dL — AB

## 2018-04-30 MED ORDER — SODIUM CHLORIDE 0.9% FLUSH
10.0000 mL | INTRAVENOUS | Status: DC | PRN
  Administered 2018-04-30: 10 mL
  Filled 2018-04-30: qty 10

## 2018-04-30 MED ORDER — DIPHENHYDRAMINE HCL 50 MG/ML IJ SOLN
INTRAMUSCULAR | Status: AC
  Filled 2018-04-30: qty 1

## 2018-04-30 MED ORDER — ACETAMINOPHEN 325 MG PO TABS
650.0000 mg | ORAL_TABLET | Freq: Once | ORAL | Status: AC
  Administered 2018-04-30: 650 mg via ORAL

## 2018-04-30 MED ORDER — DEXAMETHASONE SODIUM PHOSPHATE 10 MG/ML IJ SOLN
10.0000 mg | Freq: Once | INTRAMUSCULAR | Status: AC
  Administered 2018-04-30: 10 mg via INTRAVENOUS

## 2018-04-30 MED ORDER — HEPARIN SOD (PORK) LOCK FLUSH 100 UNIT/ML IV SOLN
500.0000 [IU] | Freq: Once | INTRAVENOUS | Status: AC | PRN
  Administered 2018-04-30: 500 [IU]
  Filled 2018-04-30: qty 5

## 2018-04-30 MED ORDER — SODIUM CHLORIDE 0.9 % IV SOLN
Freq: Once | INTRAVENOUS | Status: AC
  Administered 2018-04-30: 12:00:00 via INTRAVENOUS
  Filled 2018-04-30: qty 250

## 2018-04-30 MED ORDER — SODIUM CHLORIDE 0.9 % IV SOLN
8.0000 mg/kg | Freq: Once | INTRAVENOUS | Status: AC
  Administered 2018-04-30: 600 mg via INTRAVENOUS
  Filled 2018-04-30: qty 50

## 2018-04-30 MED ORDER — SODIUM CHLORIDE 0.9% FLUSH
10.0000 mL | INTRAVENOUS | Status: DC | PRN
  Administered 2018-04-30: 10 mL via INTRAVENOUS
  Filled 2018-04-30: qty 10

## 2018-04-30 MED ORDER — ACETAMINOPHEN 325 MG PO TABS
ORAL_TABLET | ORAL | Status: AC
  Filled 2018-04-30: qty 2

## 2018-04-30 MED ORDER — FAMOTIDINE IN NACL 20-0.9 MG/50ML-% IV SOLN
20.0000 mg | Freq: Once | INTRAVENOUS | Status: AC
  Administered 2018-04-30: 20 mg via INTRAVENOUS

## 2018-04-30 MED ORDER — DEXAMETHASONE SODIUM PHOSPHATE 10 MG/ML IJ SOLN
INTRAMUSCULAR | Status: AC
  Filled 2018-04-30: qty 1

## 2018-04-30 MED ORDER — DIPHENHYDRAMINE HCL 50 MG/ML IJ SOLN
25.0000 mg | Freq: Once | INTRAMUSCULAR | Status: AC
  Administered 2018-04-30: 25 mg via INTRAVENOUS

## 2018-04-30 MED ORDER — FAMOTIDINE IN NACL 20-0.9 MG/50ML-% IV SOLN
INTRAVENOUS | Status: AC
  Filled 2018-04-30: qty 50

## 2018-04-30 MED ORDER — SODIUM CHLORIDE 0.9 % IV SOLN
80.0000 mg/m2 | Freq: Once | INTRAVENOUS | Status: AC
  Administered 2018-04-30: 150 mg via INTRAVENOUS
  Filled 2018-04-30: qty 25

## 2018-04-30 NOTE — Progress Notes (Signed)
[facility] OFF[facility] PROGRESS NOTE   Diagnosis: Gastric cancer  INTERVAL HISTORY:   Robert [facility] returns as scheduled.  He completed cycle 2 Taxol/ramucirumab on 04/16/2018.  No change in baseline neuropathy symptoms.  No recurrence of the throat soreness.  No sign of an allergic reaction.  No right neck pain.  No dysphasia.  Objective:  Vital signs in last 24 hours:  Blood pressure (!) 157/100, pulse 97, temperature 97.8 F (36.6 C), temperature source Oral, resp. rate 16, height '5\' 6"'  (1.676 m), weight 165 lb (74.8 kg), SpO2 98 %.    HEENT: No thrush or ulcers Lymphatics: No mass in the right neck Resp: Lungs clear bilaterally Cardio: Regular rate and rhythm GI: No hepatosplenomegaly, no mass, nontender Vascular: No leg edema   Portacath/PICC-without erythema  Lab Results:  Lab Results  Component Value Date   WBC 7.5 04/30/2018   HGB 12.8 (L) 04/30/2018   HCT 39.0 04/30/2018   MCV 84.2 04/30/2018   PLT 237 04/30/2018   NEUTROABS 3.4 04/30/2018    CMP  Lab Results  Component Value Date   NA 143 04/16/2018   K 3.5 04/16/2018   CL 110 04/16/2018   CO2 22 04/16/2018   GLUCOSE 78 04/16/2018   BUN 8 04/16/2018   CREATININE 0.79 04/16/2018   CALCIUM 9.7 04/16/2018   PROT 7.1 04/16/2018   ALBUMIN 3.9 04/16/2018   AST 13 (L) 04/16/2018   ALT 14 04/16/2018   ALKPHOS 86 04/16/2018   BILITOT 0.3 04/16/2018   GFRNONAA >60 04/16/2018   GFRAA >60 04/16/2018    Lab Results  Component Value Date   CEA1 3.83 06/27/2016     Medications: I have reviewed the patient's current medications.   Assessment/Plan: 1. Gastric cancer, gastric cardia mass extendingto the GE junction confirmed on endoscopy 04/11/2016, biopsy confirmed adenocarcinoma  Staging CTs of the chest, abdomen, and pelvis 04/09/2016-gastric cardia mass, mediastinal, right hilar, and right supraclavicular lymphadenopathy  Biopsy of a right supraclavicular lymph node on 04/20/2016  revealed metastatic adenocarcinoma  FoundationACT 02/20/2017-TP53 alteration identified  Cycle 1 FOLFOX 04/30/2016 (oxaliplatin given 04/30/2016, 5-FU infusion started 05/02/2016)  Cycle 2 FOLFOX 05/14/2016  Cycle 3 FOLFOX 05/30/2016  Cycle 4 FOLFOX 06/13/2016  Cycle 5 FOLFOX 06/27/2016  Cycle 6 FOLFOX 07/11/2016  CTs 07/20/2016-decrease in supraclavicular, mediastinal, and gastrohepatic adenopathy. Decreased gastric cardia wall thickening.  Cycle 7 FOLFOX 07/25/2016  Cycle 8 FOLFOX 08/08/2016  Cycle 9 FOLFOX 08/22/2016 (oxaliplatin held due to neuropathy)  Cycle 10 FOLFOX 09/05/2016 (oxaliplatin held)  Cycle 11 FOLFOX 09/26/2016 (oxaliplatin held)  Cycle 12 FOLFOX 10/10/2016 (oxaliplatin held)  CT 10/18/2016-mild enlargement of mediastinal lymph nodes, no other evidence of disease progression, stable thickening at the GE junction and stable gastrohepatic lymphadenopathy  Cycle 13 FOLFOX 10/24/2016 (oxaliplatin held)  Maintenance Xeloda 7 days on/7 days off beginning 11/17/2016  Restaging chest CT 01/22/2017-interval progression of metastatic lymphadenopathy in the mediastinum and gastrohepatic ligament.  Salvage therapy with FOLFIRI initiated 02/20/2017  Cycle 5 FOLFIRI 04/17/2017  Restaging chest CT 04/29/2017-mild improvement in mediastinal adenopathy. No significant change in adenopathy in the gastrohepatic ligament. No evidence of disease progression.  Cycle 6 FOLFIRI 05/01/2017  Cycle 7 FOLFIRI 05/15/2017  Cycle 8 FOLFIRI 05/29/2017  Cycle 9 FOLFIRI 06/19/2017  Cycle 10 FOLFIRI 07/03/2017  Cycle 11 FOLFIRI 07/17/2017  CT chest 07/30/2017-mild decrease in mediastinal and gastrohepatic ligament lymphadenopathy, no evidence of progressive disease  Cycle 12 FOLFIRI 07/31/2017  Cycle 13 FOLFIRI 08/14/2017  Cycle 14 FOLFIRI 09/04/2017  Cycle  15 FOLFIRI 09/25/2017  Cycle 16 FOLFIRI 10/16/2017  Cycle 17 FOLFIRI 11/06/2017  Cycle 18 FOLFIRI  11/27/2017  cycle 19 FOLFIRI 12/18/2017  CT 01/06/2018-stable neck/mediastinal nodes, slight enlargement of gastrohepatic ligament node stable periportal and portacaval nodes  Cycle 20 FOLFIRI 01/08/2018  Cycle 21 FOLFIRI 01/29/2018  CT neck 02/06/2018- 4 cm necrotic right levelIII/IVlymph node similar to the chest CT 01/06/2018, considerably larger than on 07/30/2017. Unchanged adjacent right level IV/supraclavicular lymph node and partially visualized mediastinal lymph nodes.  Cycle 1 Taxol/ramucirumab 03/05/2018  Core biopsy right cervical lymph node 02/28/2018-metastatic adenocarcinoma, HER-2 negative PDL 1 score-60, MSI-stable, tumor mutational burden-10  Cycle 2 Taxol/ramucirumab 04/02/2018  Cycle 3 Taxol/ramucirumab 04/30/2018  2. Anemia secondary to #1-improved  3. Diabetes  4. Gout  5. Hypertension  6. Hyperlipidemia  7. History of a T7 compression fracture  8. Pain secondary to the gastric mass and mediastinal lymphadenopathy-resolved  9. Port-A-Cath placement 04/20/2016  10. 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 1 mm obstructing stone distal right ureter/right ureteral vesicle junction on CT 09/13/2016.  13. 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 Hypercalcemia 12/19/2016-asymptomatic  15. Nausea following FOLFIRI-emend added beginning with cycle 3, trial of Reglan beginning12/08/2017, persistent  16.Erythematous, mildly indurated rash right upper posterior leg in setting of possible recent tick bitestatus post course of doxycycline.Resolved.  17.Sore throat/left buccal ulcer following cycle 1 Taxol/ramucirumab   Disposition: Robert Beck appears well.  He has completed 2 cycles of Taxol/ramucirumab.  There has been significant  clinical improvement.  He is tolerating the treatment well.  He will complete cycle 3 beginning today.  He will undergo restaging CTs after cycle 3.  Robert Beck will return for an office visit 05/28/2018.  Betsy Coder, MD  04/30/2018  10:13 AM

## 2018-04-30 NOTE — Patient Instructions (Signed)
[facility] Discharge Instructions for Patients Receiving Chemotherapy  Today you received the following chemotherapy agents Taxol, Cyramza  To help prevent nausea and vomiting after your treatment, we encourage you to take your nausea medication as directed   If you develop nausea and vomiting that is not controlled by your nausea medication, call the clinic.   BELOW ARE SYMPTOMS THAT SHOULD BE REPORTED IMMEDIATELY:  *FEVER GREATER THAN 100.5 F  *CHILLS WITH OR WITHOUT FEVER  NAUSEA AND VOMITING THAT IS NOT CONTROLLED WITH YOUR NAUSEA MEDICATION  *UNUSUAL SHORTNESS OF BREATH  *UNUSUAL BRUISING OR BLEEDING  TENDERNESS IN MOUTH AND THROAT WITH OR WITHOUT PRESENCE OF ULCERS  *URINARY PROBLEMS  *BOWEL PROBLEMS  UNUSUAL RASH Items with * indicate a potential emergency and should be followed up as soon as possible.  Feel free to call the clinic should you have any questions or concerns. The clinic phone number is [phone].  Please show the [place] at check-in to the Emergency Department and triage nurse.

## 2018-04-30 NOTE — Progress Notes (Signed)
Per Dr. Benay Spice: OK to treat with urine protein results

## 2018-04-30 NOTE — Telephone Encounter (Signed)
Scheduled apt per 8/7 los - gave patient AVS and calender per los.

## 2018-05-04 ENCOUNTER — Other Ambulatory Visit: Payer: Self-pay | Admitting: Oncology

## 2018-05-07 ENCOUNTER — Inpatient Hospital Stay: Payer: Medicaid Other

## 2018-05-07 VITALS — BP 150/91 | HR 110 | Temp 98.0°F

## 2018-05-07 DIAGNOSIS — Z5111 Encounter for antineoplastic chemotherapy: Secondary | ICD-10-CM | POA: Diagnosis not present

## 2018-05-07 DIAGNOSIS — C160 Malignant neoplasm of cardia: Secondary | ICD-10-CM

## 2018-05-07 DIAGNOSIS — Z95828 Presence of other vascular implants and grafts: Secondary | ICD-10-CM

## 2018-05-07 LAB — CMP (CANCER CENTER ONLY)
ALT: 21 U/L (ref 0–44)
AST: 20 U/L (ref 15–41)
Albumin: 3.9 g/dL (ref 3.5–5.0)
Alkaline Phosphatase: 78 U/L (ref 38–126)
Anion gap: 13 (ref 5–15)
BUN: 11 mg/dL (ref 6–20)
CHLORIDE: 110 mmol/L (ref 98–111)
CO2: 21 mmol/L — AB (ref 22–32)
Calcium: 9.7 mg/dL (ref 8.9–10.3)
Creatinine: 0.81 mg/dL (ref 0.61–1.24)
GFR, Est AFR Am: >60 mL/min (ref >60)
Glucose, Bld: 134 mg/dL — ABNORMAL HIGH (ref 70–99)
POTASSIUM: 4 mmol/L (ref 3.5–5.1)
SODIUM: 144 mmol/L (ref 135–145)
Total Bilirubin: 0.5 mg/dL (ref 0.3–1.2)
Total Protein: 7.2 g/dL (ref 6.5–8.1)

## 2018-05-07 LAB — CBC WITH DIFFERENTIAL (CANCER CENTER ONLY)
BASOS PCT: 1 %
Basophils Absolute: 0 10*3/uL (ref 0.0–0.1)
EOS ABS: 0.2 10*3/uL (ref 0.0–0.5)
EOS PCT: 4 %
HCT: 37.7 % — ABNORMAL LOW (ref 38.4–49.9)
Hemoglobin: 12.3 g/dL — ABNORMAL LOW (ref 13.0–17.1)
LYMPHS PCT: 26 %
Lymphs Abs: 1.5 10*3/uL (ref 0.9–3.3)
MCH: 27.5 pg (ref 27.2–33.4)
MCHC: 32.6 g/dL (ref 32.0–36.0)
MCV: 84.3 fL (ref 79.3–98.0)
MONO ABS: 0.3 10*3/uL (ref 0.1–0.9)
Monocytes Relative: 6 %
Neutro Abs: 3.8 10*3/uL (ref 1.5–6.5)
Neutrophils Relative %: 63 %
PLATELETS: 241 10*3/uL (ref 140–400)
RBC: 4.47 MIL/uL (ref 4.20–5.82)
RDW: 17.5 % — AB (ref 11.0–14.6)
WBC: 5.9 10*3/uL (ref 4.0–10.3)

## 2018-05-07 MED ORDER — SODIUM CHLORIDE 0.9% FLUSH
10.0000 mL | INTRAVENOUS | Status: DC | PRN
  Administered 2018-05-07: 10 mL
  Filled 2018-05-07: qty 10

## 2018-05-07 MED ORDER — HEPARIN SOD (PORK) LOCK FLUSH 100 UNIT/ML IV SOLN
500.0000 [IU] | Freq: Once | INTRAVENOUS | Status: AC | PRN
  Administered 2018-05-07: 500 [IU]
  Filled 2018-05-07: qty 5

## 2018-05-07 MED ORDER — FAMOTIDINE IN NACL 20-0.9 MG/50ML-% IV SOLN
INTRAVENOUS | Status: AC
  Filled 2018-05-07: qty 50

## 2018-05-07 MED ORDER — PACLITAXEL CHEMO INJECTION 300 MG/50ML
80.0000 mg/m2 | Freq: Once | INTRAVENOUS | Status: AC
  Administered 2018-05-07: 150 mg via INTRAVENOUS
  Filled 2018-05-07: qty 25

## 2018-05-07 MED ORDER — DIPHENHYDRAMINE HCL 50 MG/ML IJ SOLN
INTRAMUSCULAR | Status: AC
  Filled 2018-05-07: qty 1

## 2018-05-07 MED ORDER — SODIUM CHLORIDE 0.9 % IV SOLN
Freq: Once | INTRAVENOUS | Status: AC
  Administered 2018-05-07: 10:00:00 via INTRAVENOUS
  Filled 2018-05-07: qty 250

## 2018-05-07 MED ORDER — DEXAMETHASONE SODIUM PHOSPHATE 10 MG/ML IJ SOLN
INTRAMUSCULAR | Status: AC
  Filled 2018-05-07: qty 1

## 2018-05-07 MED ORDER — FAMOTIDINE IN NACL 20-0.9 MG/50ML-% IV SOLN
20.0000 mg | Freq: Once | INTRAVENOUS | Status: AC
  Administered 2018-05-07: 20 mg via INTRAVENOUS

## 2018-05-07 MED ORDER — DIPHENHYDRAMINE HCL 50 MG/ML IJ SOLN
25.0000 mg | Freq: Once | INTRAMUSCULAR | Status: AC
Start: 2018-05-07 — End: 2018-05-07
  Administered 2018-05-07: 25 mg via INTRAVENOUS

## 2018-05-07 MED ORDER — DEXAMETHASONE SODIUM PHOSPHATE 10 MG/ML IJ SOLN
10.0000 mg | Freq: Once | INTRAMUSCULAR | Status: AC
  Administered 2018-05-07: 10 mg via INTRAVENOUS

## 2018-05-07 NOTE — Patient Instructions (Signed)
[facility] Discharge Instructions for Patients Receiving Chemotherapy  Today you received the following chemotherapy agents :  Taxol.  To help prevent nausea and vomiting after your treatment, we encourage you to take your nausea medication as prescribed.   If you develop nausea and vomiting that is not controlled by your nausea medication, call the clinic.   BELOW ARE SYMPTOMS THAT SHOULD BE REPORTED IMMEDIATELY:  *FEVER GREATER THAN 100.5 F  *CHILLS WITH OR WITHOUT FEVER  NAUSEA AND VOMITING THAT IS NOT CONTROLLED WITH YOUR NAUSEA MEDICATION  *UNUSUAL SHORTNESS OF BREATH  *UNUSUAL BRUISING OR BLEEDING  TENDERNESS IN MOUTH AND THROAT WITH OR WITHOUT PRESENCE OF ULCERS  *URINARY PROBLEMS  *BOWEL PROBLEMS  UNUSUAL RASH Items with * indicate a potential emergency and should be followed up as soon as possible.  Feel free to call the clinic should you have any questions or concerns. The clinic phone number is [phone].  Please show the CHEMO ALERT CARD at check-in to the Emergency Department and triage nurse.   

## 2018-05-07 NOTE — Patient Instructions (Signed)
Implanted Port Home Guide An implanted port is a type of central line that is placed under the skin. Central lines are used to provide IV access when treatment or nutrition needs to be given through a person's veins. Implanted ports are used for long-term IV access. An implanted port may be placed because:  You need IV medicine that would be irritating to the small veins in your hands or arms.  You need long-term IV medicines, such as antibiotics.  You need IV nutrition for a long period.  You need frequent blood draws for lab tests.  You need dialysis.  Implanted ports are usually placed in the chest area, but they can also be placed in the upper arm, the abdomen, or the leg. An implanted port has two main parts:  Reservoir. The reservoir is round and will appear as a small, raised area under your skin. The reservoir is the part where a needle is inserted to give medicines or draw blood.  Catheter. The catheter is a thin, flexible tube that extends from the reservoir. The catheter is placed into a large vein. Medicine that is inserted into the reservoir goes into the catheter and then into the vein.  How will I care for my incision site? Do not get the incision site wet. Bathe or shower as directed by your health care provider. How is my port accessed? Special steps must be taken to access the port:  Before the port is accessed, a numbing cream can be placed on the skin. This helps numb the skin over the port site.  Your health care provider uses a sterile technique to access the port. ? Your health care provider must put on a mask and sterile gloves. ? The skin over your port is cleaned carefully with an antiseptic and allowed to dry. ? The port is gently pinched between sterile gloves, and a needle is inserted into the port.  Only "non-coring" port needles should be used to access the port. Once the port is accessed, a blood return should be checked. This helps ensure that the port  is in the vein and is not clogged.  If your port needs to remain accessed for a constant infusion, a clear (transparent) bandage will be placed over the needle site. The bandage and needle will need to be changed every week, or as directed by your health care provider.  Keep the bandage covering the needle clean and dry. Do not get it wet. Follow your health care provider's instructions on how to take a shower or bath while the port is accessed.  If your port does not need to stay accessed, no bandage is needed over the port.  What is flushing? Flushing helps keep the port from getting clogged. Follow your health care provider's instructions on how and when to flush the port. Ports are usually flushed with saline solution or a medicine called heparin. The need for flushing will depend on how the port is used.  If the port is used for intermittent medicines or blood draws, the port will need to be flushed: ? After medicines have been given. ? After blood has been drawn. ? As part of routine maintenance.  If a constant infusion is running, the port may not need to be flushed.  How long will my port stay implanted? The port can stay in for as long as your health care provider thinks it is needed. When it is time for the port to come out, surgery will be   done to remove it. The procedure is similar to the one performed when the port was put in. When should I seek immediate medical care? When you have an implanted port, you should seek immediate medical care if:  You notice a bad smell coming from the incision site.  You have swelling, redness, or drainage at the incision site.  You have more swelling or pain at the port site or the surrounding area.  You have a fever that is not controlled with medicine.  This information is not intended to replace advice given to you by your health care provider. Make sure you discuss any questions you have with your health care provider. Document  Released: 09/10/2005 Document Revised: 02/16/2016 Document Reviewed: 05/18/2013 Elsevier Interactive Patient Education  2017 Elsevier Inc.  

## 2018-05-09 ENCOUNTER — Other Ambulatory Visit: Payer: Self-pay

## 2018-05-09 DIAGNOSIS — C160 Malignant neoplasm of cardia: Secondary | ICD-10-CM

## 2018-05-09 MED ORDER — POTASSIUM CHLORIDE CRYS ER 10 MEQ PO TBCR: 10.0000 meq | EXTENDED_RELEASE_TABLET | Freq: Two times a day (BID) | ORAL | 1 refills | Status: DC

## 2018-05-11 ENCOUNTER — Other Ambulatory Visit: Payer: Self-pay | Admitting: Oncology

## 2018-05-14 ENCOUNTER — Other Ambulatory Visit: Payer: Self-pay

## 2018-05-14 ENCOUNTER — Inpatient Hospital Stay: Payer: Medicaid Other

## 2018-05-14 VITALS — BP 120/88 | HR 98 | Temp 98.1°F | Resp 17

## 2018-05-14 DIAGNOSIS — C160 Malignant neoplasm of cardia: Secondary | ICD-10-CM

## 2018-05-14 DIAGNOSIS — Z5111 Encounter for antineoplastic chemotherapy: Secondary | ICD-10-CM | POA: Diagnosis not present

## 2018-05-14 LAB — CBC WITH DIFFERENTIAL (CANCER CENTER ONLY)
Basophils Absolute: 0.1 10*3/uL (ref 0.0–0.1)
Basophils Relative: 1 %
Eosinophils Absolute: 0.3 10*3/uL (ref 0.0–0.5)
Eosinophils Relative: 7 %
HCT: 36.9 % — ABNORMAL LOW (ref 38.4–49.9)
Hemoglobin: 12.5 g/dL — ABNORMAL LOW (ref 13.0–17.1)
Lymphocytes Relative: 40 %
Lymphs Abs: 1.9 10*3/uL (ref 0.9–3.3)
MCH: 27.9 pg (ref 27.2–33.4)
MCHC: 33.8 g/dL (ref 32.0–36.0)
MCV: 82.5 fL (ref 79.3–98.0)
Monocytes Absolute: 0.3 10*3/uL (ref 0.1–0.9)
Monocytes Relative: 6 %
Neutro Abs: 2.2 10*3/uL (ref 1.5–6.5)
Neutrophils Relative %: 46 %
Platelet Count: 264 10*3/uL (ref 140–400)
RBC: 4.48 MIL/uL (ref 4.20–5.82)
RDW: 19.6 % — ABNORMAL HIGH (ref 11.0–14.6)
WBC Count: 4.7 10*3/uL (ref 4.0–10.3)

## 2018-05-14 LAB — CMP (CANCER CENTER ONLY)
ALK PHOS: 75 U/L (ref 38–126)
ALT: 21 U/L (ref 0–44)
AST: 19 U/L (ref 15–41)
Albumin: 4 g/dL (ref 3.5–5.0)
Anion gap: 12 (ref 5–15)
BILIRUBIN TOTAL: 0.3 mg/dL (ref 0.3–1.2)
BUN: 10 mg/dL (ref 6–20)
CALCIUM: 10.2 mg/dL (ref 8.9–10.3)
CHLORIDE: 109 mmol/L (ref 98–111)
CO2: 25 mmol/L (ref 22–32)
CREATININE: 0.8 mg/dL (ref 0.61–1.24)
Glucose, Bld: 104 mg/dL — ABNORMAL HIGH (ref 70–99)
Potassium: 3.6 mmol/L (ref 3.5–5.1)
Sodium: 146 mmol/L — ABNORMAL HIGH (ref 135–145)
TOTAL PROTEIN: 7.2 g/dL (ref 6.5–8.1)

## 2018-05-14 MED ORDER — SODIUM CHLORIDE 0.9% FLUSH
10.0000 mL | INTRAVENOUS | Status: DC | PRN
  Administered 2018-05-14: 10 mL
  Filled 2018-05-14: qty 10

## 2018-05-14 MED ORDER — SODIUM CHLORIDE 0.9 % IV SOLN
Freq: Once | INTRAVENOUS | Status: AC
  Administered 2018-05-14: 09:00:00 via INTRAVENOUS
  Filled 2018-05-14: qty 250

## 2018-05-14 MED ORDER — FAMOTIDINE IN NACL 20-0.9 MG/50ML-% IV SOLN
20.0000 mg | Freq: Once | INTRAVENOUS | Status: AC
  Administered 2018-05-14: 20 mg via INTRAVENOUS

## 2018-05-14 MED ORDER — DEXAMETHASONE SODIUM PHOSPHATE 10 MG/ML IJ SOLN
10.0000 mg | Freq: Once | INTRAMUSCULAR | Status: AC
  Administered 2018-05-14: 10 mg via INTRAVENOUS

## 2018-05-14 MED ORDER — SODIUM CHLORIDE 0.9 % IV SOLN
80.0000 mg/m2 | Freq: Once | INTRAVENOUS | Status: AC
  Administered 2018-05-14: 150 mg via INTRAVENOUS
  Filled 2018-05-14: qty 25

## 2018-05-14 MED ORDER — ACETAMINOPHEN 325 MG PO TABS
ORAL_TABLET | ORAL | Status: AC
  Filled 2018-05-14: qty 2

## 2018-05-14 MED ORDER — DEXAMETHASONE SODIUM PHOSPHATE 10 MG/ML IJ SOLN
INTRAMUSCULAR | Status: AC
  Filled 2018-05-14: qty 1

## 2018-05-14 MED ORDER — DIPHENHYDRAMINE HCL 50 MG/ML IJ SOLN
INTRAMUSCULAR | Status: AC
  Filled 2018-05-14: qty 1

## 2018-05-14 MED ORDER — FAMOTIDINE IN NACL 20-0.9 MG/50ML-% IV SOLN
INTRAVENOUS | Status: AC
  Filled 2018-05-14: qty 50

## 2018-05-14 MED ORDER — POTASSIUM CHLORIDE ER 10 MEQ PO CPCR: 10.0000 meq | ORAL_CAPSULE | Freq: Two times a day (BID) | ORAL | 0 refills | Status: DC

## 2018-05-14 MED ORDER — SODIUM CHLORIDE 0.9 % IV SOLN
8.0000 mg/kg | Freq: Once | INTRAVENOUS | Status: AC
  Administered 2018-05-14: 600 mg via INTRAVENOUS
  Filled 2018-05-14: qty 50

## 2018-05-14 MED ORDER — ACETAMINOPHEN 325 MG PO TABS
650.0000 mg | ORAL_TABLET | Freq: Once | ORAL | Status: AC
  Administered 2018-05-14: 650 mg via ORAL

## 2018-05-14 MED ORDER — DIPHENHYDRAMINE HCL 50 MG/ML IJ SOLN
25.0000 mg | Freq: Once | INTRAMUSCULAR | Status: AC
  Administered 2018-05-14: 25 mg via INTRAVENOUS

## 2018-05-14 MED ORDER — HEPARIN SOD (PORK) LOCK FLUSH 100 UNIT/ML IV SOLN
500.0000 [IU] | Freq: Once | INTRAVENOUS | Status: AC | PRN
  Administered 2018-05-14: 500 [IU]
  Filled 2018-05-14: qty 5

## 2018-05-14 NOTE — Progress Notes (Signed)
Err

## 2018-05-14 NOTE — Patient Instructions (Signed)
[facility] for [facility] Receiving Chemotherapy  Today you received the following chemotherapy agents :  Cyramza,  Taxol.  To help prevent nausea and vomiting after your treatment, we encourage you to take your nausea medication as prescribed.   If you develop nausea and vomiting that is not controlled by your nausea medication, call the clinic.   BELOW ARE SYMPTOMS THAT SHOULD BE REPORTED IMMEDIATELY:  *FEVER GREATER THAN 100.5 F  *CHILLS WITH OR WITHOUT FEVER  NAUSEA AND VOMITING THAT IS NOT CONTROLLED WITH YOUR NAUSEA MEDICATION  *UNUSUAL SHORTNESS OF BREATH  *UNUSUAL BRUISING OR BLEEDING  TENDERNESS IN MOUTH AND THROAT WITH OR WITHOUT PRESENCE OF ULCERS  *URINARY PROBLEMS  *BOWEL PROBLEMS  UNUSUAL RASH Items with * indicate a potential emergency and should be followed up as soon as possible.  Feel free to call the clinic should you have any questions or concerns. The clinic phone number is [phone].  Please show the CHEMO ALERT CARD at check-in to the Emergency Department and triage nurse.   

## 2018-05-23 ENCOUNTER — Ambulatory Visit (HOSPITAL_COMMUNITY)
Admission: RE | Admit: 2018-05-23 | Discharge: 2018-05-23 | Disposition: A | Discharge status: Home / Self Care | Payer: Medicaid Other | Source: Ambulatory Visit | Attending: Oncology | Admitting: Oncology

## 2018-05-23 DIAGNOSIS — C160 Malignant neoplasm of cardia: Secondary | ICD-10-CM | POA: Insufficient documentation

## 2018-05-23 DIAGNOSIS — R590 Localized enlarged lymph nodes: Secondary | ICD-10-CM | POA: Insufficient documentation

## 2018-05-23 MED ORDER — IOHEXOL 300 MG/ML  SOLN
75.0000 mL | Freq: Once | INTRAMUSCULAR | Status: AC | PRN
  Administered 2018-05-23: 75 mL via INTRAVENOUS

## 2018-05-25 ENCOUNTER — Encounter: Payer: Self-pay | Admitting: Oncology

## 2018-05-26 ENCOUNTER — Other Ambulatory Visit: Payer: Self-pay | Admitting: Oncology

## 2018-05-27 ENCOUNTER — Other Ambulatory Visit: Payer: Self-pay | Admitting: *Deleted

## 2018-05-27 DIAGNOSIS — Z95828 Presence of other vascular implants and grafts: Secondary | ICD-10-CM

## 2018-05-28 ENCOUNTER — Inpatient Hospital Stay: Payer: Medicaid Other

## 2018-05-28 ENCOUNTER — Telehealth: Payer: Self-pay | Admitting: Oncology

## 2018-05-28 ENCOUNTER — Inpatient Hospital Stay (HOSPITAL_BASED_OUTPATIENT_CLINIC_OR_DEPARTMENT_OTHER): Payer: Medicaid Other | Admitting: Oncology

## 2018-05-28 ENCOUNTER — Inpatient Hospital Stay: Payer: Medicaid Other | Attending: Oncology

## 2018-05-28 ENCOUNTER — Encounter: Payer: Self-pay | Admitting: Oncology

## 2018-05-28 VITALS — BP 160/96 | HR 84 | Temp 98.3°F | Resp 19 | Ht 66.0 in | Wt 165.8 lb

## 2018-05-28 DIAGNOSIS — C770 Secondary and unspecified malignant neoplasm of lymph nodes of head, face and neck: Secondary | ICD-10-CM | POA: Insufficient documentation

## 2018-05-28 DIAGNOSIS — Z95828 Presence of other vascular implants and grafts: Secondary | ICD-10-CM

## 2018-05-28 DIAGNOSIS — Z5111 Encounter for antineoplastic chemotherapy: Secondary | ICD-10-CM | POA: Insufficient documentation

## 2018-05-28 DIAGNOSIS — Z5112 Encounter for antineoplastic immunotherapy: Secondary | ICD-10-CM | POA: Diagnosis present

## 2018-05-28 DIAGNOSIS — E119 Type 2 diabetes mellitus without complications: Secondary | ICD-10-CM | POA: Diagnosis not present

## 2018-05-28 DIAGNOSIS — C160 Malignant neoplasm of cardia: Secondary | ICD-10-CM | POA: Insufficient documentation

## 2018-05-28 DIAGNOSIS — D630 Anemia in neoplastic disease: Secondary | ICD-10-CM | POA: Diagnosis not present

## 2018-05-28 DIAGNOSIS — I10 Essential (primary) hypertension: Secondary | ICD-10-CM

## 2018-05-28 LAB — CBC WITH DIFFERENTIAL (CANCER CENTER ONLY)
BASOS ABS: 0 10*3/uL (ref 0.0–0.1)
BASOS PCT: 1 %
Eosinophils Absolute: 0.1 10*3/uL (ref 0.0–0.5)
Eosinophils Relative: 3 %
HCT: 36.1 % — ABNORMAL LOW (ref 38.4–49.9)
HEMOGLOBIN: 11.7 g/dL — AB (ref 13.0–17.1)
LYMPHS PCT: 24 %
Lymphs Abs: 1.3 10*3/uL (ref 0.9–3.3)
MCH: 28 pg (ref 27.2–33.4)
MCHC: 32.4 g/dL (ref 32.0–36.0)
MCV: 86.4 fL (ref 79.3–98.0)
MONO ABS: 0.6 10*3/uL (ref 0.1–0.9)
MONOS PCT: 12 %
NEUTROS ABS: 3.3 10*3/uL (ref 1.5–6.5)
NEUTROS PCT: 60 %
Platelet Count: 230 10*3/uL (ref 140–400)
RBC: 4.18 MIL/uL — ABNORMAL LOW (ref 4.20–5.82)
RDW: 18.3 % — AB (ref 11.0–14.6)
WBC Count: 5.3 10*3/uL (ref 4.0–10.3)

## 2018-05-28 LAB — CMP (CANCER CENTER ONLY)
ALBUMIN: 3.7 g/dL (ref 3.5–5.0)
ALT: 16 U/L (ref 0–44)
ANION GAP: 9 (ref 5–15)
AST: 17 U/L (ref 15–41)
Alkaline Phosphatase: 63 U/L (ref 38–126)
BILIRUBIN TOTAL: 0.4 mg/dL (ref 0.3–1.2)
BUN: 8 mg/dL (ref 6–20)
CO2: 23 mmol/L (ref 22–32)
Calcium: 9.3 mg/dL (ref 8.9–10.3)
Chloride: 111 mmol/L (ref 98–111)
Creatinine: 0.81 mg/dL (ref 0.61–1.24)
GFR, Est AFR Am: >60 mL/min (ref >60)
Glucose, Bld: 183 mg/dL — ABNORMAL HIGH (ref 70–99)
POTASSIUM: 3.9 mmol/L (ref 3.5–5.1)
Sodium: 143 mmol/L (ref 135–145)
TOTAL PROTEIN: 6.5 g/dL (ref 6.5–8.1)

## 2018-05-28 LAB — TOTAL PROTEIN, URINE DIPSTICK: PROTEIN: NEGATIVE mg/dL

## 2018-05-28 MED ORDER — ACETAMINOPHEN 325 MG PO TABS
ORAL_TABLET | ORAL | Status: AC
  Filled 2018-05-28: qty 2

## 2018-05-28 MED ORDER — SODIUM CHLORIDE 0.9 % IV SOLN
80.0000 mg/m2 | Freq: Once | INTRAVENOUS | Status: AC
  Administered 2018-05-28: 150 mg via INTRAVENOUS
  Filled 2018-05-28: qty 25

## 2018-05-28 MED ORDER — FAMOTIDINE IN NACL 20-0.9 MG/50ML-% IV SOLN
INTRAVENOUS | Status: AC
  Filled 2018-05-28: qty 50

## 2018-05-28 MED ORDER — SODIUM CHLORIDE 0.9 % IJ SOLN
10.0000 mL | Freq: Once | INTRAMUSCULAR | Status: AC
  Administered 2018-05-28: 10 mL
  Filled 2018-05-28: qty 10

## 2018-05-28 MED ORDER — SODIUM CHLORIDE 0.9% FLUSH
10.0000 mL | INTRAVENOUS | Status: DC | PRN
  Administered 2018-05-28: 10 mL
  Filled 2018-05-28: qty 10

## 2018-05-28 MED ORDER — DIPHENHYDRAMINE HCL 50 MG/ML IJ SOLN
25.0000 mg | Freq: Once | INTRAMUSCULAR | Status: AC
  Administered 2018-05-28: 25 mg via INTRAVENOUS

## 2018-05-28 MED ORDER — DEXAMETHASONE SODIUM PHOSPHATE 10 MG/ML IJ SOLN
INTRAMUSCULAR | Status: AC
  Filled 2018-05-28: qty 1

## 2018-05-28 MED ORDER — FAMOTIDINE IN NACL 20-0.9 MG/50ML-% IV SOLN
20.0000 mg | Freq: Once | INTRAVENOUS | Status: AC
  Administered 2018-05-28: 20 mg via INTRAVENOUS

## 2018-05-28 MED ORDER — DIPHENHYDRAMINE HCL 50 MG/ML IJ SOLN
INTRAMUSCULAR | Status: AC
  Filled 2018-05-28: qty 1

## 2018-05-28 MED ORDER — ACETAMINOPHEN 325 MG PO TABS
650.0000 mg | ORAL_TABLET | Freq: Once | ORAL | Status: AC
  Administered 2018-05-28: 650 mg via ORAL

## 2018-05-28 MED ORDER — SODIUM CHLORIDE 0.9 % IV SOLN
8.0000 mg/kg | Freq: Once | INTRAVENOUS | Status: AC
  Administered 2018-05-28: 600 mg via INTRAVENOUS
  Filled 2018-05-28: qty 50

## 2018-05-28 MED ORDER — DEXAMETHASONE SODIUM PHOSPHATE 10 MG/ML IJ SOLN
10.0000 mg | Freq: Once | INTRAMUSCULAR | Status: AC
  Administered 2018-05-28: 10 mg via INTRAVENOUS

## 2018-05-28 MED ORDER — HEPARIN SOD (PORK) LOCK FLUSH 100 UNIT/ML IV SOLN
500.0000 [IU] | Freq: Once | INTRAVENOUS | Status: AC | PRN
  Administered 2018-05-28: 500 [IU]
  Filled 2018-05-28: qty 5

## 2018-05-28 MED ORDER — SODIUM CHLORIDE 0.9 % IV SOLN
Freq: Once | INTRAVENOUS | Status: AC
  Administered 2018-05-28: 12:00:00 via INTRAVENOUS
  Filled 2018-05-28: qty 250

## 2018-05-28 NOTE — Telephone Encounter (Signed)
Scheduled appt per 9/4 los - gave patient AVS and calender.

## 2018-05-28 NOTE — Progress Notes (Signed)
[place] OFFICE PROGRESS NOTE   Diagnosis: Gastric cancer  INTERVAL HISTORY:   Robert Beck returns for a scheduled visit.  He was last treated with Taxol/ramucirumab on 05/14/2018.  No symptoms of an allergic reaction.  No bleeding or thrombosis.  He has stable neuropathy symptoms that he relates to oxaliplatin. He reports a few episodes of waking up in the night with acute respiratory distress that goes away after a few minutes.  No dysphasia.  He and his wife currently have a "cold".  The upper respiratory symptoms are improving.  Objective:  Vital signs in last 24 hours:  Blood pressure (!) 160/96, pulse 84, temperature 98.3 F (36.8 C), temperature source Oral, resp. rate 19, height '5\' 6"'  (1.676 m), weight 165 lb 12.8 oz (75.2 kg), SpO2 97 %.    HEENT: No thrush or ulcers, neck without mass Lymphatics: No cervical or supraclavicular nodes Resp: Lungs clear bilaterally Cardio: Regular rate and rhythm GI: No hepatomegaly, no mass Vascular: Leg edema   Lab Results:  Lab Results  Component Value Date   WBC 5.3 05/28/2018   HGB 11.7 (L) 05/28/2018   HCT 36.1 (L) 05/28/2018   MCV 86.4 05/28/2018   PLT 230 05/28/2018   NEUTROABS 3.3 05/28/2018    CMP  Lab Results  Component Value Date   NA 143 05/28/2018   K 3.9 05/28/2018   CL 111 05/28/2018   CO2 23 05/28/2018   GLUCOSE 183 (H) 05/28/2018   BUN 8 05/28/2018   CREATININE 0.81 05/28/2018   CALCIUM 9.3 05/28/2018   PROT 6.5 05/28/2018   ALBUMIN 3.7 05/28/2018   AST 17 05/28/2018   ALT 16 05/28/2018   ALKPHOS 63 05/28/2018   BILITOT 0.4 05/28/2018   GFRNONAA >60 05/28/2018   GFRAA >60 05/28/2018    Lab Results  Component Value Date   CEA1 3.83 06/27/2016     Medications: I have reviewed the patient's current medications.   Assessment/Plan: 1. Gastric cancer, gastric cardia mass extendingto the GE junction confirmed on endoscopy 04/11/2016, biopsy confirmed adenocarcinoma  Staging  CTs of the chest, abdomen, and pelvis 04/09/2016-gastric cardia mass, mediastinal, right hilar, and right supraclavicular lymphadenopathy  Biopsy of a right supraclavicular lymph node on 04/20/2016 revealed metastatic adenocarcinoma  FoundationACT 02/20/2017-TP53 alteration identified  Cycle 1 FOLFOX 04/30/2016 (oxaliplatin given 04/30/2016, 5-FU infusion started 05/02/2016)  Cycle 2 FOLFOX 05/14/2016  Cycle 3 FOLFOX 05/30/2016  Cycle 4 FOLFOX 06/13/2016  Cycle 5 FOLFOX 06/27/2016  Cycle 6 FOLFOX 07/11/2016  CTs 07/20/2016-decrease in supraclavicular, mediastinal, and gastrohepatic adenopathy. Decreased gastric cardia wall thickening.  Cycle 7 FOLFOX 07/25/2016  Cycle 8 FOLFOX 08/08/2016  Cycle 9 FOLFOX 08/22/2016 (oxaliplatin held due to neuropathy)  Cycle 10 FOLFOX 09/05/2016 (oxaliplatin held)  Cycle 11 FOLFOX 09/26/2016 (oxaliplatin held)  Cycle 12 FOLFOX 10/10/2016 (oxaliplatin held)  CT 10/18/2016-mild enlargement of mediastinal lymph nodes, no other evidence of disease progression, stable thickening at the GE junction and stable gastrohepatic lymphadenopathy  Cycle 13 FOLFOX 10/24/2016 (oxaliplatin held)  Maintenance Xeloda 7 days on/7 days off beginning 11/17/2016  Restaging chest CT 01/22/2017-interval progression of metastatic lymphadenopathy in the mediastinum and gastrohepatic ligament.  Salvage therapy with FOLFIRI initiated 02/20/2017  Cycle 5 FOLFIRI 04/17/2017  Restaging chest CT 04/29/2017-mild improvement in mediastinal adenopathy. No significant change in adenopathy in the gastrohepatic ligament. No evidence of disease progression.  Cycle 6 FOLFIRI 05/01/2017  Cycle 7 FOLFIRI 05/15/2017  Cycle 8 FOLFIRI 05/29/2017  Cycle 9 FOLFIRI 06/19/2017  Cycle 10 FOLFIRI 07/03/2017  Cycle 11 FOLFIRI 07/17/2017  CT chest 07/30/2017-mild decrease in mediastinal and gastrohepatic ligament lymphadenopathy, no evidence of progressive disease  Cycle 12  FOLFIRI 07/31/2017  Cycle 13 FOLFIRI 08/14/2017  Cycle 14 FOLFIRI 09/04/2017  Cycle 15 FOLFIRI 09/25/2017  Cycle 16 FOLFIRI 10/16/2017  Cycle 17 FOLFIRI 11/06/2017  Cycle 18 FOLFIRI 11/27/2017  cycle 19 FOLFIRI 12/18/2017  CT 01/06/2018-stable neck/mediastinal nodes, slight enlargement of gastrohepatic ligament node stable periportal and portacaval nodes  Cycle 20 FOLFIRI 01/08/2018  Cycle 21 FOLFIRI 01/29/2018  CT neck 02/06/2018- 4 cm necrotic right levelIII/IVlymph node similar to the chest CT 01/06/2018, considerably larger than on 07/30/2017. Unchanged adjacent right level IV/supraclavicular lymph node and partially visualized mediastinal lymph nodes.  Cycle 1 Taxol/ramucirumab 03/05/2018  Core biopsy right cervical lymph node 02/28/2018-metastatic adenocarcinoma, HER-2 negative PDL 1 score-60, MSI-stable, tumor mutational burden-10  Cycle 2 Taxol/ramucirumab7/06/2018  Cycle 3 Taxol/ramucirumab 04/30/2018  CTs 05/23/2018- decrease in mediastinal and gastrohepatic ligament adenopathy, decreased right sided level 3/4 node  Cycle 4 Taxol/ramucirumab 05/28/2018   2. Anemia secondary to #1-improved  3. Diabetes  4. Gout  5. Hypertension  6. Hyperlipidemia  7. History of a T7 compression fracture  8. Pain secondary to the gastric mass and mediastinal lymphadenopathy-resolved  9. Port-A-Cath placement 04/20/2016  10. 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 1 mm obstructing stone distal right ureter/right ureteral vesicle junction on CT 09/13/2016.  13. 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 Hypercalcemia 12/19/2016-asymptomatic  15. Nausea following FOLFIRI-emend added beginning with cycle 3, trial of Reglan beginning12/08/2017,  persistent  16.Erythematous, mildly indurated rash right upper posterior leg in setting of possible recent tick bitestatus post course of doxycycline.Resolved.  17.Sore throat/left buccal ulcer following cycle 1 Taxol/ramucirumab   Disposition: Robert Beck appears unchanged.  He is tolerating the Taxol/ramucirumab well.  The restaging CTs confirm a response to therapy.  The plan is to proceed with cycle 4 Taxol/ramucirumab today.  He will be scheduled for an office visit in 4 weeks.  I reviewed the CT images with Robert Beck today.  He will contact us for recurrent episodes of dyspnea.  25 minutes were spent with the patient today.  The majority of the time was used for counseling and coordination of care.  Betsy Coder, MD  05/28/2018  11:54 AM

## 2018-05-28 NOTE — Patient Instructions (Signed)
[facility] Discharge Instructions for Patients Receiving Chemotherapy  Today you received the following chemotherapy agents :  Cyramza,  Taxol.  To help prevent nausea and vomiting after your treatment, we encourage you to take your nausea medication as prescribed.   If you develop nausea and vomiting that is not controlled by your nausea medication, call the clinic.   BELOW ARE SYMPTOMS THAT SHOULD BE REPORTED IMMEDIATELY:  *FEVER GREATER THAN 100.5 F  *CHILLS WITH OR WITHOUT FEVER  NAUSEA AND VOMITING THAT IS NOT CONTROLLED WITH YOUR NAUSEA MEDICATION  *UNUSUAL SHORTNESS OF BREATH  *UNUSUAL BRUISING OR BLEEDING  TENDERNESS IN MOUTH AND THROAT WITH OR WITHOUT PRESENCE OF ULCERS  *URINARY PROBLEMS  *BOWEL PROBLEMS  UNUSUAL RASH Items with * indicate a potential emergency and should be followed up as soon as possible.  Feel free to call the clinic should you have any questions or concerns. The clinic phone number is [phone].  Please show the CHEMO ALERT CARD at check-in to the Emergency Department and triage nurse.   

## 2018-06-01 ENCOUNTER — Other Ambulatory Visit: Payer: Self-pay | Admitting: Oncology

## 2018-06-04 ENCOUNTER — Inpatient Hospital Stay: Payer: Medicaid Other

## 2018-06-04 VITALS — BP 143/95 | HR 96 | Temp 98.2°F | Resp 18

## 2018-06-04 DIAGNOSIS — C160 Malignant neoplasm of cardia: Secondary | ICD-10-CM

## 2018-06-04 DIAGNOSIS — Z95828 Presence of other vascular implants and grafts: Secondary | ICD-10-CM

## 2018-06-04 DIAGNOSIS — Z5111 Encounter for antineoplastic chemotherapy: Secondary | ICD-10-CM | POA: Diagnosis not present

## 2018-06-04 LAB — CMP (CANCER CENTER ONLY)
ALBUMIN: 4 g/dL (ref 3.5–5.0)
ALK PHOS: 66 U/L (ref 38–126)
ALT: 17 U/L (ref 0–44)
AST: 17 U/L (ref 15–41)
Anion gap: 12 (ref 5–15)
BILIRUBIN TOTAL: 0.6 mg/dL (ref 0.3–1.2)
BUN: 14 mg/dL (ref 6–20)
CALCIUM: 9.8 mg/dL (ref 8.9–10.3)
CO2: 22 mmol/L (ref 22–32)
Chloride: 111 mmol/L (ref 98–111)
Creatinine: 0.83 mg/dL (ref 0.61–1.24)
GFR, Est AFR Am: >60 mL/min (ref >60)
GFR, Estimated: >60 mL/min (ref >60)
GLUCOSE: 156 mg/dL — AB (ref 70–99)
POTASSIUM: 3.7 mmol/L (ref 3.5–5.1)
Sodium: 145 mmol/L (ref 135–145)
TOTAL PROTEIN: 7 g/dL (ref 6.5–8.1)

## 2018-06-04 LAB — CBC WITH DIFFERENTIAL (CANCER CENTER ONLY)
BASOS ABS: 0.1 10*3/uL (ref 0.0–0.1)
BASOS PCT: 1 %
Eosinophils Absolute: 0.2 10*3/uL (ref 0.0–0.5)
Eosinophils Relative: 3 %
HEMATOCRIT: 37.8 % — AB (ref 38.4–49.9)
HEMOGLOBIN: 12.3 g/dL — AB (ref 13.0–17.1)
LYMPHS PCT: 30 %
Lymphs Abs: 2 10*3/uL (ref 0.9–3.3)
MCH: 28.5 pg (ref 27.2–33.4)
MCHC: 32.5 g/dL (ref 32.0–36.0)
MCV: 87.7 fL (ref 79.3–98.0)
Monocytes Absolute: 0.2 10*3/uL (ref 0.1–0.9)
Monocytes Relative: 3 %
NEUTROS ABS: 4.3 10*3/uL (ref 1.5–6.5)
NEUTROS PCT: 63 %
Platelet Count: 268 10*3/uL (ref 140–400)
RBC: 4.31 MIL/uL (ref 4.20–5.82)
RDW: 17.7 % — ABNORMAL HIGH (ref 11.0–14.6)
WBC Count: 6.8 10*3/uL (ref 4.0–10.3)

## 2018-06-04 MED ORDER — HEPARIN SOD (PORK) LOCK FLUSH 100 UNIT/ML IV SOLN
500.0000 [IU] | Freq: Once | INTRAVENOUS | Status: AC | PRN
  Administered 2018-06-04: 500 [IU]
  Filled 2018-06-04: qty 5

## 2018-06-04 MED ORDER — FAMOTIDINE IN NACL 20-0.9 MG/50ML-% IV SOLN
20.0000 mg | Freq: Once | INTRAVENOUS | Status: AC
  Administered 2018-06-04: 20 mg via INTRAVENOUS

## 2018-06-04 MED ORDER — FAMOTIDINE IN NACL 20-0.9 MG/50ML-% IV SOLN
INTRAVENOUS | Status: AC
  Filled 2018-06-04: qty 50

## 2018-06-04 MED ORDER — DIPHENHYDRAMINE HCL 50 MG/ML IJ SOLN
25.0000 mg | Freq: Once | INTRAMUSCULAR | Status: AC
  Administered 2018-06-04: 25 mg via INTRAVENOUS

## 2018-06-04 MED ORDER — SODIUM CHLORIDE 0.9% FLUSH
10.0000 mL | INTRAVENOUS | Status: DC | PRN
  Administered 2018-06-04: 10 mL via INTRAVENOUS
  Filled 2018-06-04: qty 10

## 2018-06-04 MED ORDER — DEXAMETHASONE SODIUM PHOSPHATE 10 MG/ML IJ SOLN
INTRAMUSCULAR | Status: AC
  Filled 2018-06-04: qty 1

## 2018-06-04 MED ORDER — SODIUM CHLORIDE 0.9 % IV SOLN
Freq: Once | INTRAVENOUS | Status: AC
  Administered 2018-06-04: 09:00:00 via INTRAVENOUS
  Filled 2018-06-04: qty 250

## 2018-06-04 MED ORDER — SODIUM CHLORIDE 0.9% FLUSH
10.0000 mL | INTRAVENOUS | Status: DC | PRN
  Administered 2018-06-04: 10 mL
  Filled 2018-06-04: qty 10

## 2018-06-04 MED ORDER — DEXAMETHASONE SODIUM PHOSPHATE 10 MG/ML IJ SOLN
10.0000 mg | Freq: Once | INTRAMUSCULAR | Status: AC
  Administered 2018-06-04: 10 mg via INTRAVENOUS

## 2018-06-04 MED ORDER — SODIUM CHLORIDE 0.9 % IV SOLN
80.0000 mg/m2 | Freq: Once | INTRAVENOUS | Status: AC
  Administered 2018-06-04: 150 mg via INTRAVENOUS
  Filled 2018-06-04: qty 25

## 2018-06-04 MED ORDER — DIPHENHYDRAMINE HCL 50 MG/ML IJ SOLN
INTRAMUSCULAR | Status: AC
  Filled 2018-06-04: qty 1

## 2018-06-11 ENCOUNTER — Other Ambulatory Visit: Payer: Self-pay | Admitting: *Deleted

## 2018-06-11 ENCOUNTER — Inpatient Hospital Stay: Payer: Medicaid Other

## 2018-06-11 VITALS — BP 135/90 | HR 83 | Temp 97.7°F | Resp 18

## 2018-06-11 DIAGNOSIS — Z95828 Presence of other vascular implants and grafts: Secondary | ICD-10-CM

## 2018-06-11 DIAGNOSIS — C160 Malignant neoplasm of cardia: Secondary | ICD-10-CM

## 2018-06-11 DIAGNOSIS — Z5111 Encounter for antineoplastic chemotherapy: Secondary | ICD-10-CM | POA: Diagnosis not present

## 2018-06-11 LAB — CMP (CANCER CENTER ONLY)
ALK PHOS: 57 U/L (ref 38–126)
ALT: 17 U/L (ref 0–44)
AST: 16 U/L (ref 15–41)
Albumin: 4.1 g/dL (ref 3.5–5.0)
Anion gap: 11 (ref 5–15)
BILIRUBIN TOTAL: 0.5 mg/dL (ref 0.3–1.2)
BUN: 12 mg/dL (ref 6–20)
CALCIUM: 9.9 mg/dL (ref 8.9–10.3)
CHLORIDE: 113 mmol/L — AB (ref 98–111)
CO2: 23 mmol/L (ref 22–32)
CREATININE: 0.75 mg/dL (ref 0.61–1.24)
Glucose, Bld: 78 mg/dL (ref 70–99)
Potassium: 3.8 mmol/L (ref 3.5–5.1)
Sodium: 147 mmol/L — ABNORMAL HIGH (ref 135–145)
Total Protein: 7 g/dL (ref 6.5–8.1)

## 2018-06-11 LAB — CBC WITH DIFFERENTIAL (CANCER CENTER ONLY)
Basophils Absolute: 0.1 10*3/uL (ref 0.0–0.1)
Basophils Relative: 3 %
EOS PCT: 4 %
Eosinophils Absolute: 0.2 10*3/uL (ref 0.0–0.5)
HEMATOCRIT: 36.5 % — AB (ref 38.4–49.9)
HEMOGLOBIN: 11.9 g/dL — AB (ref 13.0–17.1)
LYMPHS ABS: 1.6 10*3/uL (ref 0.9–3.3)
LYMPHS PCT: 40 %
MCH: 28.7 pg (ref 27.2–33.4)
MCHC: 32.6 g/dL (ref 32.0–36.0)
MCV: 88.2 fL (ref 79.3–98.0)
Monocytes Absolute: 0.2 10*3/uL (ref 0.1–0.9)
Monocytes Relative: 5 %
NEUTROS ABS: 1.9 10*3/uL (ref 1.5–6.5)
NEUTROS PCT: 48 %
PLATELETS: 202 10*3/uL (ref 140–400)
RBC: 4.14 MIL/uL — AB (ref 4.20–5.82)
RDW: 17.6 % — ABNORMAL HIGH (ref 11.0–14.6)
WBC: 3.9 10*3/uL — AB (ref 4.0–10.3)

## 2018-06-11 MED ORDER — SODIUM CHLORIDE 0.9 % IV SOLN
8.0000 mg/kg | Freq: Once | INTRAVENOUS | Status: AC
  Administered 2018-06-11: 600 mg via INTRAVENOUS
  Filled 2018-06-11: qty 10

## 2018-06-11 MED ORDER — ONDANSETRON HCL 8 MG PO TABS: 8.0000 mg | ORAL_TABLET | Freq: Three times a day (TID) | ORAL | 3 refills | Status: DC | PRN

## 2018-06-11 MED ORDER — ACETAMINOPHEN 325 MG PO TABS
650.0000 mg | ORAL_TABLET | Freq: Once | ORAL | Status: AC
  Administered 2018-06-11: 650 mg via ORAL

## 2018-06-11 MED ORDER — FAMOTIDINE IN NACL 20-0.9 MG/50ML-% IV SOLN
20.0000 mg | Freq: Once | INTRAVENOUS | Status: AC
  Administered 2018-06-11: 20 mg via INTRAVENOUS

## 2018-06-11 MED ORDER — SODIUM CHLORIDE 0.9% FLUSH
10.0000 mL | INTRAVENOUS | Status: DC | PRN
  Administered 2018-06-11: 10 mL
  Filled 2018-06-11: qty 10

## 2018-06-11 MED ORDER — HEPARIN SOD (PORK) LOCK FLUSH 100 UNIT/ML IV SOLN
500.0000 [IU] | Freq: Once | INTRAVENOUS | Status: AC | PRN
  Administered 2018-06-11: 500 [IU]
  Filled 2018-06-11: qty 5

## 2018-06-11 MED ORDER — SODIUM CHLORIDE 0.9 % IV SOLN
80.0000 mg/m2 | Freq: Once | INTRAVENOUS | Status: AC
  Administered 2018-06-11: 150 mg via INTRAVENOUS
  Filled 2018-06-11: qty 25

## 2018-06-11 MED ORDER — DEXAMETHASONE SODIUM PHOSPHATE 10 MG/ML IJ SOLN
10.0000 mg | Freq: Once | INTRAMUSCULAR | Status: AC
  Administered 2018-06-11: 10 mg via INTRAVENOUS

## 2018-06-11 MED ORDER — FAMOTIDINE IN NACL 20-0.9 MG/50ML-% IV SOLN
INTRAVENOUS | Status: AC
  Filled 2018-06-11: qty 50

## 2018-06-11 MED ORDER — ONDANSETRON HCL 8 MG PO TABS
8.0000 mg | ORAL_TABLET | Freq: Once | ORAL | Status: AC
  Administered 2018-06-11: 8 mg via ORAL

## 2018-06-11 MED ORDER — ACETAMINOPHEN 325 MG PO TABS
ORAL_TABLET | ORAL | Status: AC
  Filled 2018-06-11: qty 2

## 2018-06-11 MED ORDER — SODIUM CHLORIDE 0.9% FLUSH
10.0000 mL | INTRAVENOUS | Status: DC | PRN
  Administered 2018-06-11: 10 mL via INTRAVENOUS
  Filled 2018-06-11: qty 10

## 2018-06-11 MED ORDER — DEXAMETHASONE SODIUM PHOSPHATE 10 MG/ML IJ SOLN
INTRAMUSCULAR | Status: AC
  Filled 2018-06-11: qty 1

## 2018-06-11 MED ORDER — DIPHENHYDRAMINE HCL 50 MG/ML IJ SOLN
INTRAMUSCULAR | Status: AC
  Filled 2018-06-11: qty 1

## 2018-06-11 MED ORDER — SODIUM CHLORIDE 0.9 % IV SOLN
Freq: Once | INTRAVENOUS | Status: AC
  Administered 2018-06-11: 09:00:00 via INTRAVENOUS
  Filled 2018-06-11: qty 250

## 2018-06-11 MED ORDER — ONDANSETRON HCL 8 MG PO TABS
ORAL_TABLET | ORAL | Status: AC
  Filled 2018-06-11: qty 1

## 2018-06-11 MED ORDER — DIPHENHYDRAMINE HCL 50 MG/ML IJ SOLN
25.0000 mg | Freq: Once | INTRAMUSCULAR | Status: AC
  Administered 2018-06-11: 25 mg via INTRAVENOUS

## 2018-06-11 NOTE — Patient Instructions (Signed)
[facility] Discharge Instructions for Patients Receiving Chemotherapy  Today you received the following chemotherapy agents :  Cyramza,  Taxol.  To help prevent nausea and vomiting after your treatment, we encourage you to take your nausea medication as prescribed.   If you develop nausea and vomiting that is not controlled by your nausea medication, call the clinic.   BELOW ARE SYMPTOMS THAT SHOULD BE REPORTED IMMEDIATELY:  *FEVER GREATER THAN 100.5 F  *CHILLS WITH OR WITHOUT FEVER  NAUSEA AND VOMITING THAT IS NOT CONTROLLED WITH YOUR NAUSEA MEDICATION  *UNUSUAL SHORTNESS OF BREATH  *UNUSUAL BRUISING OR BLEEDING  TENDERNESS IN MOUTH AND THROAT WITH OR WITHOUT PRESENCE OF ULCERS  *URINARY PROBLEMS  *BOWEL PROBLEMS  UNUSUAL RASH Items with * indicate a potential emergency and should be followed up as soon as possible.  Feel free to call the clinic should you have any questions or concerns. The clinic phone number is [phone].  Please show the CHEMO ALERT CARD at check-in to the Emergency Department and triage nurse.   

## 2018-06-22 ENCOUNTER — Other Ambulatory Visit: Payer: Self-pay | Admitting: Oncology

## 2018-06-23 ENCOUNTER — Telehealth: Payer: Self-pay | Admitting: Oncology

## 2018-06-23 NOTE — Telephone Encounter (Signed)
Spoke with relative re delay in lab draw 10/1 and asked that patient arrive 7:45 - 8 am.

## 2018-06-24 ENCOUNTER — Inpatient Hospital Stay: Payer: Medicaid Other

## 2018-06-24 ENCOUNTER — Inpatient Hospital Stay: Payer: Medicaid Other | Attending: Oncology

## 2018-06-24 ENCOUNTER — Inpatient Hospital Stay (HOSPITAL_BASED_OUTPATIENT_CLINIC_OR_DEPARTMENT_OTHER): Payer: Medicaid Other | Admitting: Oncology

## 2018-06-24 ENCOUNTER — Encounter: Payer: Self-pay | Admitting: Oncology

## 2018-06-24 ENCOUNTER — Telehealth: Payer: Self-pay | Admitting: Oncology

## 2018-06-24 VITALS — BP 129/81 | HR 77 | Temp 97.7°F | Resp 18 | Ht 66.0 in | Wt 167.6 lb

## 2018-06-24 DIAGNOSIS — Z5111 Encounter for antineoplastic chemotherapy: Secondary | ICD-10-CM | POA: Insufficient documentation

## 2018-06-24 DIAGNOSIS — H547 Unspecified visual loss: Secondary | ICD-10-CM

## 2018-06-24 DIAGNOSIS — Z23 Encounter for immunization: Secondary | ICD-10-CM | POA: Diagnosis not present

## 2018-06-24 DIAGNOSIS — C160 Malignant neoplasm of cardia: Secondary | ICD-10-CM

## 2018-06-24 DIAGNOSIS — G620 Drug-induced polyneuropathy: Secondary | ICD-10-CM

## 2018-06-24 DIAGNOSIS — T451X5A Adverse effect of antineoplastic and immunosuppressive drugs, initial encounter: Secondary | ICD-10-CM

## 2018-06-24 DIAGNOSIS — E119 Type 2 diabetes mellitus without complications: Secondary | ICD-10-CM

## 2018-06-24 DIAGNOSIS — Z5112 Encounter for antineoplastic immunotherapy: Secondary | ICD-10-CM | POA: Diagnosis present

## 2018-06-24 DIAGNOSIS — Z95828 Presence of other vascular implants and grafts: Secondary | ICD-10-CM

## 2018-06-24 DIAGNOSIS — C770 Secondary and unspecified malignant neoplasm of lymph nodes of head, face and neck: Secondary | ICD-10-CM

## 2018-06-24 DIAGNOSIS — I10 Essential (primary) hypertension: Secondary | ICD-10-CM

## 2018-06-24 LAB — CMP (CANCER CENTER ONLY)
ALBUMIN: 3.8 g/dL (ref 3.5–5.0)
ALT: 16 U/L (ref 0–44)
ANION GAP: 10 (ref 5–15)
AST: 18 U/L (ref 15–41)
Alkaline Phosphatase: 56 U/L (ref 38–126)
BILIRUBIN TOTAL: 0.3 mg/dL (ref 0.3–1.2)
BUN: 7 mg/dL (ref 6–20)
CO2: 25 mmol/L (ref 22–32)
Calcium: 9.7 mg/dL (ref 8.9–10.3)
Chloride: 110 mmol/L (ref 98–111)
Creatinine: 0.79 mg/dL (ref 0.61–1.24)
GFR, Est AFR Am: >60 mL/min (ref >60)
GFR, Estimated: >60 mL/min (ref >60)
Glucose, Bld: 73 mg/dL (ref 70–99)
POTASSIUM: 3.3 mmol/L — AB (ref 3.5–5.1)
Sodium: 145 mmol/L (ref 135–145)
TOTAL PROTEIN: 6.7 g/dL (ref 6.5–8.1)

## 2018-06-24 LAB — CBC WITH DIFFERENTIAL (CANCER CENTER ONLY)
BASOS ABS: 0.1 10*3/uL (ref 0.0–0.1)
Basophils Relative: 1 %
Eosinophils Absolute: 0.1 10*3/uL (ref 0.0–0.5)
Eosinophils Relative: 3 %
HEMATOCRIT: 35.6 % — AB (ref 38.4–49.9)
HEMOGLOBIN: 12 g/dL — AB (ref 13.0–17.1)
LYMPHS PCT: 28 %
Lymphs Abs: 1.3 10*3/uL (ref 0.9–3.3)
MCH: 29.5 pg (ref 27.2–33.4)
MCHC: 33.8 g/dL (ref 32.0–36.0)
MCV: 87.3 fL (ref 79.3–98.0)
Monocytes Absolute: 0.6 10*3/uL (ref 0.1–0.9)
Monocytes Relative: 13 %
NEUTROS ABS: 2.6 10*3/uL (ref 1.5–6.5)
Neutrophils Relative %: 55 %
Platelet Count: 231 10*3/uL (ref 140–400)
RBC: 4.08 MIL/uL — AB (ref 4.20–5.82)
RDW: 19.2 % — ABNORMAL HIGH (ref 11.0–14.6)
WBC: 4.7 10*3/uL (ref 4.0–10.3)

## 2018-06-24 LAB — TOTAL PROTEIN, URINE DIPSTICK: Protein, ur: NEGATIVE mg/dL

## 2018-06-24 MED ORDER — INFLUENZA VAC SPLIT QUAD 0.5 ML IM SUSY
PREFILLED_SYRINGE | INTRAMUSCULAR | Status: AC
  Filled 2018-06-24: qty 0.5

## 2018-06-24 MED ORDER — HEPARIN SOD (PORK) LOCK FLUSH 100 UNIT/ML IV SOLN
500.0000 [IU] | Freq: Once | INTRAVENOUS | Status: AC | PRN
  Administered 2018-06-24: 500 [IU]
  Filled 2018-06-24: qty 5

## 2018-06-24 MED ORDER — SODIUM CHLORIDE 0.9% FLUSH
10.0000 mL | INTRAVENOUS | Status: DC | PRN
  Administered 2018-06-24: 10 mL via INTRAVENOUS
  Filled 2018-06-24: qty 10

## 2018-06-24 MED ORDER — SODIUM CHLORIDE 0.9 % IV SOLN: 8.0000 mg/kg | Freq: Once | INTRAVENOUS | Status: DC

## 2018-06-24 MED ORDER — DIPHENHYDRAMINE HCL 50 MG/ML IJ SOLN
INTRAMUSCULAR | Status: AC
  Filled 2018-06-24: qty 1

## 2018-06-24 MED ORDER — DIPHENHYDRAMINE HCL 50 MG/ML IJ SOLN
25.0000 mg | Freq: Once | INTRAMUSCULAR | Status: AC
  Administered 2018-06-24: 25 mg via INTRAVENOUS

## 2018-06-24 MED ORDER — SODIUM CHLORIDE 0.9 % IV SOLN
80.0000 mg/m2 | Freq: Once | INTRAVENOUS | Status: AC
  Administered 2018-06-24: 150 mg via INTRAVENOUS
  Filled 2018-06-24: qty 25

## 2018-06-24 MED ORDER — SODIUM CHLORIDE 0.9 % IV SOLN
Freq: Once | INTRAVENOUS | Status: AC
  Administered 2018-06-24: 09:00:00 via INTRAVENOUS
  Filled 2018-06-24: qty 250

## 2018-06-24 MED ORDER — DEXAMETHASONE SODIUM PHOSPHATE 10 MG/ML IJ SOLN
10.0000 mg | Freq: Once | INTRAMUSCULAR | Status: AC
  Administered 2018-06-24: 10 mg via INTRAVENOUS

## 2018-06-24 MED ORDER — FAMOTIDINE IN NACL 20-0.9 MG/50ML-% IV SOLN
20.0000 mg | Freq: Once | INTRAVENOUS | Status: AC
  Administered 2018-06-24: 20 mg via INTRAVENOUS

## 2018-06-24 MED ORDER — DEXAMETHASONE SODIUM PHOSPHATE 10 MG/ML IJ SOLN
INTRAMUSCULAR | Status: AC
  Filled 2018-06-24: qty 1

## 2018-06-24 MED ORDER — SODIUM CHLORIDE 0.9% FLUSH
10.0000 mL | INTRAVENOUS | Status: DC | PRN
  Administered 2018-06-24: 10 mL
  Filled 2018-06-24: qty 10

## 2018-06-24 MED ORDER — FAMOTIDINE IN NACL 20-0.9 MG/50ML-% IV SOLN
INTRAVENOUS | Status: AC
  Filled 2018-06-24: qty 50

## 2018-06-24 MED ORDER — INFLUENZA VAC SPLIT QUAD 0.5 ML IM SUSY
0.5000 mL | PREFILLED_SYRINGE | Freq: Once | INTRAMUSCULAR | Status: AC
  Administered 2018-06-24: 0.5 mL via INTRAMUSCULAR

## 2018-06-24 NOTE — Telephone Encounter (Signed)
Scheduled appt per 10/1 los - pt to get an updated schedule next visit.   

## 2018-06-24 NOTE — Progress Notes (Signed)
Appointment made for patient at [facility] with Dr. Kathlen Mody for Wednesday 10/2 at 8:05am. Patient advised in infusion and confirms appt date/time.

## 2018-06-24 NOTE — Progress Notes (Signed)
[place] OFFICE PROGRESS NOTE   Diagnosis: Gastric cancer  INTERVAL HISTORY:   Robert Beck returns for a scheduled visit.  He completed another cycle of Taxol/ramucirumab with day 15 Taxol given on 06/11/2018. He denies dysphasia.  He has noted mild worsening of neuropathy symptoms in the feet. He has developed "blurred "vision in the right eye for the past 3 weeks.  No visual field loss.  No diplopia.  He initially felt the blurred vision was related to hyperglycemia.  He reports this usually improves when he changes his diet, but has not with this event.  Objective:  Vital signs in last 24 hours:  Blood pressure 129/81, pulse 77, temperature 97.7 F (36.5 C), temperature source Oral, resp. rate 18, height '5\' 6"'  (1.676 m), weight 167 lb 9.6 oz (76 kg), SpO2 100 %.    HEENT: No thrush or ulcers, no conjunctival erythema or exudate.  There is darkening of the lower part of the right retina versus lens. Resp: Lungs clear bilaterally Cardio: Regular rate and rhythm GI: No hepatomegaly, nontender Vascular: No leg edema     Portacath/PICC-without erythema  Lab Results:  Lab Results  Component Value Date   WBC 4.7 06/24/2018   HGB 12.0 (L) 06/24/2018   HCT 35.6 (L) 06/24/2018   MCV 87.3 06/24/2018   PLT 231 06/24/2018   NEUTROABS 2.6 06/24/2018    CMP  Lab Results  Component Value Date   NA 147 (H) 06/11/2018   K 3.8 06/11/2018   CL 113 (H) 06/11/2018   CO2 23 06/11/2018   GLUCOSE 78 06/11/2018   BUN 12 06/11/2018   CREATININE 0.75 06/11/2018   CALCIUM 9.9 06/11/2018   PROT 7.0 06/11/2018   ALBUMIN 4.1 06/11/2018   AST 16 06/11/2018   ALT 17 06/11/2018   ALKPHOS 57 06/11/2018   BILITOT 0.5 06/11/2018   GFRNONAA >60 06/11/2018   GFRAA >60 06/11/2018    Lab Results  Component Value Date   CEA1 3.83 06/27/2016     Medications: I have reviewed the patient's current medications.   Assessment/Plan: 1. Gastric cancer, gastric cardia mass  extendingto the GE junction confirmed on endoscopy 04/11/2016, biopsy confirmed adenocarcinoma  Staging CTs of the chest, abdomen, and pelvis 04/09/2016-gastric cardia mass, mediastinal, right hilar, and right supraclavicular lymphadenopathy  Biopsy of a right supraclavicular lymph node on 04/20/2016 revealed metastatic adenocarcinoma  FoundationACT 02/20/2017-TP53 alteration identified  Cycle 1 FOLFOX 04/30/2016 (oxaliplatin given 04/30/2016, 5-FU infusion started 05/02/2016)  Cycle 2 FOLFOX 05/14/2016  Cycle 3 FOLFOX 05/30/2016  Cycle 4 FOLFOX 06/13/2016  Cycle 5 FOLFOX 06/27/2016  Cycle 6 FOLFOX 07/11/2016  CTs 07/20/2016-decrease in supraclavicular, mediastinal, and gastrohepatic adenopathy. Decreased gastric cardia wall thickening.  Cycle 7 FOLFOX 07/25/2016  Cycle 8 FOLFOX 08/08/2016  Cycle 9 FOLFOX 08/22/2016 (oxaliplatin held due to neuropathy)  Cycle 10 FOLFOX 09/05/2016 (oxaliplatin held)  Cycle 11 FOLFOX 09/26/2016 (oxaliplatin held)  Cycle 12 FOLFOX 10/10/2016 (oxaliplatin held)  CT 10/18/2016-mild enlargement of mediastinal lymph nodes, no other evidence of disease progression, stable thickening at the GE junction and stable gastrohepatic lymphadenopathy  Cycle 13 FOLFOX 10/24/2016 (oxaliplatin held)  Maintenance Xeloda 7 days on/7 days off beginning 11/17/2016  Restaging chest CT 01/22/2017-interval progression of metastatic lymphadenopathy in the mediastinum and gastrohepatic ligament.  Salvage therapy with FOLFIRI initiated 02/20/2017  Cycle 5 FOLFIRI 04/17/2017  Restaging chest CT 04/29/2017-mild improvement in mediastinal adenopathy. No significant change in adenopathy in the gastrohepatic ligament. No evidence of disease progression.  Cycle 6 FOLFIRI  05/01/2017  Cycle 7 FOLFIRI 05/15/2017  Cycle 8 FOLFIRI 05/29/2017  Cycle 9 FOLFIRI 06/19/2017  Cycle 10 FOLFIRI 07/03/2017  Cycle 11 FOLFIRI 07/17/2017  CT chest 07/30/2017-mild decrease in  mediastinal and gastrohepatic ligament lymphadenopathy, no evidence of progressive disease  Cycle 12 FOLFIRI 07/31/2017  Cycle 13 FOLFIRI 08/14/2017  Cycle 14 FOLFIRI 09/04/2017  Cycle 15 FOLFIRI 09/25/2017  Cycle 16 FOLFIRI 10/16/2017  Cycle 17 FOLFIRI 11/06/2017  Cycle 18 FOLFIRI 11/27/2017  cycle 19 FOLFIRI 12/18/2017  CT 01/06/2018-stable neck/mediastinal nodes, slight enlargement of gastrohepatic ligament node stable periportal and portacaval nodes  Cycle 20 FOLFIRI 01/08/2018  Cycle 21 FOLFIRI 01/29/2018  CT neck 02/06/2018- 4 cm necrotic right levelIII/IVlymph node similar to the chest CT 01/06/2018, considerably larger than on 07/30/2017. Unchanged adjacent right level IV/supraclavicular lymph node and partially visualized mediastinal lymph nodes.  Cycle 1 Taxol/ramucirumab 03/05/2018  Core biopsy right cervical lymph node 02/28/2018-metastatic adenocarcinoma, HER-2 negative PDL 1 score-60, MSI-stable, tumor mutational burden-10  Cycle 2 Taxol/ramucirumab7/06/2018  Cycle 3 Taxol/ramucirumab 04/30/2018  CTs 05/23/2018- decrease in mediastinal and gastrohepatic ligament adenopathy, decreased right sided level 3/4 node  Cycle 4 Taxol/ramucirumab 05/28/2018   Cycle 5 Taxol/ramucirumab 06/24/2018 (day 1 ramucirumab held secondary to right eye vision loss)  2. Anemia secondary to #1-improved  3. Diabetes  4. Gout  5. Hypertension  6. Hyperlipidemia  7. History of a T7 compression fracture  8. Pain secondary to the gastric mass and mediastinal lymphadenopathy-resolved  9. Port-A-Cath placement 04/20/2016  10. 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 1 mm obstructing stone distal right ureter/right ureteral vesicle junction on CT 09/13/2016.  13. Hoarseness 12/19/2016-potentially related to mediastinal adenopathy-left vocal cord paralysis confirmed on ENT  exam 12/27/2016-improved  14. Hypercalcemia 12/19/2016-asymptomatic  15. Nausea following FOLFIRI-emend added beginning with cycle 3, trial of Reglan beginning12/08/2017, persistent  16.Erythematous, mildly indurated rash right upper posterior leg in setting of possible recent tick bitestatus post course of doxycycline.Resolved.  17.Sore throat/left buccal ulcer following cycle 1 Taxol/ramucirumab  18.  Right eye vision loss- etiology unclear, potentially related to diabetes     Disposition: His overall status appears unchanged.  The plan is to proceed with another cycle of chemotherapy today.  He has developed partial vision loss in the right eye.  This may be related to diabetes, a vascular event, or another etiology.  We will hold ramucirumab today secondary to the possibility of a vascular event.  We will make an urgent ophthalmology referral.  Robert Beck will receive an influenza vaccine today.  He will return for day 8 Taxol in 1 week.  He will be scheduled for an office visit and cycle 6 Taxol/ramucirumab on 07/23/2018.  25 minutes were spent with the patient today.  The majority of the time was used for counseling and coordination of care.  Betsy Coder, MD  06/24/2018  8:48 AM

## 2018-06-30 ENCOUNTER — Other Ambulatory Visit: Payer: Self-pay | Admitting: *Deleted

## 2018-06-30 DIAGNOSIS — C160 Malignant neoplasm of cardia: Secondary | ICD-10-CM

## 2018-06-30 MED ORDER — LORAZEPAM 0.5 MG PO TABS: 0.5000 mg | ORAL_TABLET | Freq: Three times a day (TID) | ORAL | 0 refills | Status: AC | PRN

## 2018-07-02 ENCOUNTER — Inpatient Hospital Stay: Payer: Medicaid Other

## 2018-07-02 VITALS — BP 154/91 | HR 90 | Temp 98.1°F | Resp 18

## 2018-07-02 DIAGNOSIS — C160 Malignant neoplasm of cardia: Secondary | ICD-10-CM

## 2018-07-02 DIAGNOSIS — Z95828 Presence of other vascular implants and grafts: Secondary | ICD-10-CM

## 2018-07-02 DIAGNOSIS — Z5112 Encounter for antineoplastic immunotherapy: Secondary | ICD-10-CM | POA: Diagnosis not present

## 2018-07-02 LAB — CBC WITH DIFFERENTIAL (CANCER CENTER ONLY)
ABS IMMATURE GRANULOCYTES: 0.06 10*3/uL (ref 0.00–0.07)
Basophils Absolute: 0.1 10*3/uL (ref 0.0–0.1)
Basophils Relative: 1 %
EOS PCT: 4 %
Eosinophils Absolute: 0.3 10*3/uL (ref 0.0–0.5)
HEMATOCRIT: 36.5 % — AB (ref 39.0–52.0)
HEMOGLOBIN: 12.1 g/dL — AB (ref 13.0–17.0)
Immature Granulocytes: 1 %
LYMPHS PCT: 30 %
Lymphs Abs: 2.1 10*3/uL (ref 0.7–4.0)
MCH: 29.2 pg (ref 26.0–34.0)
MCHC: 33.2 g/dL (ref 30.0–36.0)
MCV: 88.2 fL (ref 80.0–100.0)
MONO ABS: 0.4 10*3/uL (ref 0.1–1.0)
MONOS PCT: 5 %
NEUTROS ABS: 4.2 10*3/uL (ref 1.7–7.7)
Neutrophils Relative %: 59 %
Platelet Count: 262 10*3/uL (ref 150–400)
RBC: 4.14 MIL/uL — AB (ref 4.22–5.81)
RDW: 16.1 % — ABNORMAL HIGH (ref 11.5–15.5)
WBC: 7.1 10*3/uL (ref 4.0–10.5)
nRBC: 0.6 % — ABNORMAL HIGH (ref 0.0–0.2)

## 2018-07-02 LAB — CMP (CANCER CENTER ONLY)
ALBUMIN: 4 g/dL (ref 3.5–5.0)
ALK PHOS: 63 U/L (ref 38–126)
ALT: 21 U/L (ref 0–44)
AST: 20 U/L (ref 15–41)
Anion gap: 9 (ref 5–15)
BILIRUBIN TOTAL: 0.4 mg/dL (ref 0.3–1.2)
BUN: 7 mg/dL (ref 6–20)
CHLORIDE: 108 mmol/L (ref 98–111)
CO2: 24 mmol/L (ref 22–32)
CREATININE: 0.77 mg/dL (ref 0.61–1.24)
Calcium: 9.8 mg/dL (ref 8.9–10.3)
GFR, Est AFR Am: >60 mL/min (ref >60)
Glucose, Bld: 100 mg/dL — ABNORMAL HIGH (ref 70–99)
POTASSIUM: 3.8 mmol/L (ref 3.5–5.1)
Sodium: 141 mmol/L (ref 135–145)
Total Protein: 6.9 g/dL (ref 6.5–8.1)

## 2018-07-02 MED ORDER — SODIUM CHLORIDE 0.9 % IV SOLN
Freq: Once | INTRAVENOUS | Status: AC
  Administered 2018-07-02: 10:00:00 via INTRAVENOUS
  Filled 2018-07-02: qty 250

## 2018-07-02 MED ORDER — SODIUM CHLORIDE 0.9 % IV SOLN
80.0000 mg/m2 | Freq: Once | INTRAVENOUS | Status: AC
  Administered 2018-07-02: 150 mg via INTRAVENOUS
  Filled 2018-07-02: qty 25

## 2018-07-02 MED ORDER — DIPHENHYDRAMINE HCL 50 MG/ML IJ SOLN
INTRAMUSCULAR | Status: AC
  Filled 2018-07-02: qty 1

## 2018-07-02 MED ORDER — SODIUM CHLORIDE 0.9% FLUSH
10.0000 mL | INTRAVENOUS | Status: DC | PRN
  Administered 2018-07-02: 10 mL via INTRAVENOUS
  Filled 2018-07-02: qty 10

## 2018-07-02 MED ORDER — DEXAMETHASONE SODIUM PHOSPHATE 10 MG/ML IJ SOLN
10.0000 mg | Freq: Once | INTRAMUSCULAR | Status: AC
  Administered 2018-07-02: 10 mg via INTRAVENOUS

## 2018-07-02 MED ORDER — FAMOTIDINE IN NACL 20-0.9 MG/50ML-% IV SOLN
20.0000 mg | Freq: Once | INTRAVENOUS | Status: AC
  Administered 2018-07-02: 20 mg via INTRAVENOUS

## 2018-07-02 MED ORDER — SODIUM CHLORIDE 0.9% FLUSH
10.0000 mL | INTRAVENOUS | Status: DC | PRN
  Administered 2018-07-02: 10 mL
  Filled 2018-07-02: qty 10

## 2018-07-02 MED ORDER — HEPARIN SOD (PORK) LOCK FLUSH 100 UNIT/ML IV SOLN
500.0000 [IU] | Freq: Once | INTRAVENOUS | Status: AC | PRN
  Administered 2018-07-02: 500 [IU]
  Filled 2018-07-02: qty 5

## 2018-07-02 MED ORDER — DEXAMETHASONE SODIUM PHOSPHATE 10 MG/ML IJ SOLN
INTRAMUSCULAR | Status: AC
  Filled 2018-07-02: qty 1

## 2018-07-02 MED ORDER — FAMOTIDINE IN NACL 20-0.9 MG/50ML-% IV SOLN
INTRAVENOUS | Status: AC
  Filled 2018-07-02: qty 50

## 2018-07-02 MED ORDER — DIPHENHYDRAMINE HCL 50 MG/ML IJ SOLN
25.0000 mg | Freq: Once | INTRAMUSCULAR | Status: AC
  Administered 2018-07-02: 25 mg via INTRAVENOUS

## 2018-07-02 NOTE — Patient Instructions (Signed)
[facility] Discharge Instructions for Patients Receiving Chemotherapy  Today you received the following chemotherapy agents :  Taxol.  To help prevent nausea and vomiting after your treatment, we encourage you to take your nausea medication as prescribed.   If you develop nausea and vomiting that is not controlled by your nausea medication, call the clinic.   BELOW ARE SYMPTOMS THAT SHOULD BE REPORTED IMMEDIATELY:  *FEVER GREATER THAN 100.5 F  *CHILLS WITH OR WITHOUT FEVER  NAUSEA AND VOMITING THAT IS NOT CONTROLLED WITH YOUR NAUSEA MEDICATION  *UNUSUAL SHORTNESS OF BREATH  *UNUSUAL BRUISING OR BLEEDING  TENDERNESS IN MOUTH AND THROAT WITH OR WITHOUT PRESENCE OF ULCERS  *URINARY PROBLEMS  *BOWEL PROBLEMS  UNUSUAL RASH Items with * indicate a potential emergency and should be followed up as soon as possible.  Feel free to call the clinic should you have any questions or concerns. The clinic phone number is [phone].  Please show the CHEMO ALERT CARD at check-in to the Emergency Department and triage nurse.   

## 2018-07-09 ENCOUNTER — Inpatient Hospital Stay: Payer: Medicaid Other

## 2018-07-09 VITALS — BP 143/91 | HR 79 | Temp 98.0°F | Resp 16

## 2018-07-09 DIAGNOSIS — C160 Malignant neoplasm of cardia: Secondary | ICD-10-CM

## 2018-07-09 DIAGNOSIS — Z95828 Presence of other vascular implants and grafts: Secondary | ICD-10-CM

## 2018-07-09 DIAGNOSIS — Z5112 Encounter for antineoplastic immunotherapy: Secondary | ICD-10-CM | POA: Diagnosis not present

## 2018-07-09 LAB — CMP (CANCER CENTER ONLY)
ALBUMIN: 3.8 g/dL (ref 3.5–5.0)
ALT: 18 U/L (ref 0–44)
ANION GAP: 9 (ref 5–15)
AST: 17 U/L (ref 15–41)
Alkaline Phosphatase: 64 U/L (ref 38–126)
BILIRUBIN TOTAL: 0.4 mg/dL (ref 0.3–1.2)
BUN: 8 mg/dL (ref 6–20)
CO2: 23 mmol/L (ref 22–32)
Calcium: 9.3 mg/dL (ref 8.9–10.3)
Chloride: 112 mmol/L — ABNORMAL HIGH (ref 98–111)
Creatinine: 0.77 mg/dL (ref 0.61–1.24)
GFR, Est AFR Am: >60 mL/min (ref >60)
GFR, Estimated: >60 mL/min (ref >60)
GLUCOSE: 53 mg/dL — AB (ref 70–99)
POTASSIUM: 3.7 mmol/L (ref 3.5–5.1)
SODIUM: 144 mmol/L (ref 135–145)
TOTAL PROTEIN: 6.7 g/dL (ref 6.5–8.1)

## 2018-07-09 LAB — CBC WITH DIFFERENTIAL (CANCER CENTER ONLY)
Abs Immature Granulocytes: 0.03 10*3/uL (ref 0.00–0.07)
BASOS ABS: 0.1 10*3/uL (ref 0.0–0.1)
Basophils Relative: 1 %
EOS PCT: 5 %
Eosinophils Absolute: 0.3 10*3/uL (ref 0.0–0.5)
HCT: 36.8 % — ABNORMAL LOW (ref 39.0–52.0)
HEMOGLOBIN: 12.2 g/dL — AB (ref 13.0–17.0)
Immature Granulocytes: 1 %
LYMPHS PCT: 44 %
Lymphs Abs: 2.7 10*3/uL (ref 0.7–4.0)
MCH: 29.6 pg (ref 26.0–34.0)
MCHC: 33.2 g/dL (ref 30.0–36.0)
MCV: 89.3 fL (ref 80.0–100.0)
Monocytes Absolute: 0.3 10*3/uL (ref 0.1–1.0)
Monocytes Relative: 5 %
NRBC: 0 % (ref 0.0–0.2)
Neutro Abs: 2.8 10*3/uL (ref 1.7–7.7)
Neutrophils Relative %: 44 %
PLATELETS: 253 10*3/uL (ref 150–400)
RBC: 4.12 MIL/uL — AB (ref 4.22–5.81)
RDW: 16 % — AB (ref 11.5–15.5)
WBC: 6.2 10*3/uL (ref 4.0–10.5)

## 2018-07-09 MED ORDER — DEXAMETHASONE SODIUM PHOSPHATE 10 MG/ML IJ SOLN
INTRAMUSCULAR | Status: AC
  Filled 2018-07-09: qty 1

## 2018-07-09 MED ORDER — DEXAMETHASONE SODIUM PHOSPHATE 10 MG/ML IJ SOLN
10.0000 mg | Freq: Once | INTRAMUSCULAR | Status: AC
  Administered 2018-07-09: 10 mg via INTRAVENOUS

## 2018-07-09 MED ORDER — SODIUM CHLORIDE 0.9% FLUSH
10.0000 mL | INTRAVENOUS | Status: DC | PRN
  Administered 2018-07-09: 10 mL via INTRAVENOUS
  Filled 2018-07-09: qty 10

## 2018-07-09 MED ORDER — ACETAMINOPHEN 325 MG PO TABS
650.0000 mg | ORAL_TABLET | Freq: Once | ORAL | Status: AC
  Administered 2018-07-09: 650 mg via ORAL

## 2018-07-09 MED ORDER — HEPARIN SOD (PORK) LOCK FLUSH 100 UNIT/ML IV SOLN
500.0000 [IU] | Freq: Once | INTRAVENOUS | Status: AC | PRN
  Administered 2018-07-09: 500 [IU]
  Filled 2018-07-09: qty 5

## 2018-07-09 MED ORDER — SODIUM CHLORIDE 0.9 % IV SOLN
Freq: Once | INTRAVENOUS | Status: AC
  Administered 2018-07-09: 09:00:00 via INTRAVENOUS
  Filled 2018-07-09: qty 250

## 2018-07-09 MED ORDER — ACETAMINOPHEN 325 MG PO TABS
ORAL_TABLET | ORAL | Status: AC
  Filled 2018-07-09: qty 2

## 2018-07-09 MED ORDER — SODIUM CHLORIDE 0.9% FLUSH
10.0000 mL | INTRAVENOUS | Status: DC | PRN
  Administered 2018-07-09: 10 mL
  Filled 2018-07-09: qty 10

## 2018-07-09 MED ORDER — DIPHENHYDRAMINE HCL 50 MG/ML IJ SOLN
INTRAMUSCULAR | Status: AC
  Filled 2018-07-09: qty 1

## 2018-07-09 MED ORDER — DIPHENHYDRAMINE HCL 50 MG/ML IJ SOLN
25.0000 mg | Freq: Once | INTRAMUSCULAR | Status: AC
  Administered 2018-07-09: 25 mg via INTRAVENOUS

## 2018-07-09 MED ORDER — SODIUM CHLORIDE 0.9 % IV SOLN
80.0000 mg/m2 | Freq: Once | INTRAVENOUS | Status: AC
  Administered 2018-07-09: 150 mg via INTRAVENOUS
  Filled 2018-07-09: qty 25

## 2018-07-09 MED ORDER — FAMOTIDINE IN NACL 20-0.9 MG/50ML-% IV SOLN
20.0000 mg | Freq: Once | INTRAVENOUS | Status: AC
  Administered 2018-07-09: 20 mg via INTRAVENOUS

## 2018-07-09 MED ORDER — SODIUM CHLORIDE 0.9 % IV SOLN
8.0000 mg/kg | Freq: Once | INTRAVENOUS | Status: AC
  Administered 2018-07-09: 600 mg via INTRAVENOUS
  Filled 2018-07-09: qty 10

## 2018-07-09 MED ORDER — FAMOTIDINE IN NACL 20-0.9 MG/50ML-% IV SOLN
INTRAVENOUS | Status: AC
  Filled 2018-07-09: qty 50

## 2018-07-09 NOTE — Progress Notes (Signed)
Spoke w/ Dr. Benay Spice, pt has seen an ophthalmologist and his eye issues were unrelated to his paclitaxel/ramucirumab treatment. Okay for treatment with both today.  Robert Beck, PharmD, [facility] Pharmac[facility]acy Phone: [phone] 07/09/2018

## 2018-07-09 NOTE — Progress Notes (Signed)
Patient was provided with crackers and peanut butter once in infusion since blood glucose was 53.  Patient was asymptomatic.

## 2018-07-09 NOTE — Patient Instructions (Signed)
[facility] Discharge Instructions for Patients Receiving Chemotherapy  Today you received the following chemotherapy agents :  Cyramza,  Taxol.  To help prevent nausea and vomiting after your treatment, we encourage you to take your nausea medication as prescribed.   If you develop nausea and vomiting that is not controlled by your nausea medication, call the clinic.   BELOW ARE SYMPTOMS THAT SHOULD BE REPORTED IMMEDIATELY:  *FEVER GREATER THAN 100.5 F  *CHILLS WITH OR WITHOUT FEVER  NAUSEA AND VOMITING THAT IS NOT CONTROLLED WITH YOUR NAUSEA MEDICATION  *UNUSUAL SHORTNESS OF BREATH  *UNUSUAL BRUISING OR BLEEDING  TENDERNESS IN MOUTH AND THROAT WITH OR WITHOUT PRESENCE OF ULCERS  *URINARY PROBLEMS  *BOWEL PROBLEMS  UNUSUAL RASH Items with * indicate a potential emergency and should be followed up as soon as possible.  Feel free to call the clinic should you have any questions or concerns. The clinic phone number is [phone].  Please show the CHEMO ALERT CARD at check-in to the Emergency Department and triage nurse.   
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
## 2018-07-14 ENCOUNTER — Encounter (INDEPENDENT_AMBULATORY_CARE_PROVIDER_SITE_OTHER): Payer: Self-pay | Admitting: Ophthalmology

## 2018-07-14 ENCOUNTER — Ambulatory Visit (INDEPENDENT_AMBULATORY_CARE_PROVIDER_SITE_OTHER): Payer: Medicaid Other | Admitting: Ophthalmology

## 2018-07-14 DIAGNOSIS — H3581 Retinal edema: Secondary | ICD-10-CM

## 2018-07-14 DIAGNOSIS — E119 Type 2 diabetes mellitus without complications: Secondary | ICD-10-CM | POA: Diagnosis not present

## 2018-07-14 DIAGNOSIS — H35033 Hypertensive retinopathy, bilateral: Secondary | ICD-10-CM | POA: Diagnosis not present

## 2018-07-14 DIAGNOSIS — H33309 Unspecified retinal break, unspecified eye: Secondary | ICD-10-CM | POA: Diagnosis not present

## 2018-07-14 DIAGNOSIS — H25813 Combined forms of age-related cataract, bilateral: Secondary | ICD-10-CM

## 2018-07-14 DIAGNOSIS — I10 Essential (primary) hypertension: Secondary | ICD-10-CM

## 2018-07-19 ENCOUNTER — Other Ambulatory Visit: Payer: Self-pay | Admitting: Oncology

## 2018-07-23 ENCOUNTER — Inpatient Hospital Stay: Payer: Medicaid Other

## 2018-07-23 ENCOUNTER — Inpatient Hospital Stay (HOSPITAL_BASED_OUTPATIENT_CLINIC_OR_DEPARTMENT_OTHER): Payer: Medicaid Other | Admitting: Oncology

## 2018-07-23 ENCOUNTER — Telehealth: Payer: Self-pay | Admitting: Oncology

## 2018-07-23 VITALS — BP 156/91 | HR 77 | Temp 97.5°F | Resp 19 | Ht 66.0 in | Wt 162.5 lb

## 2018-07-23 DIAGNOSIS — C160 Malignant neoplasm of cardia: Secondary | ICD-10-CM

## 2018-07-23 DIAGNOSIS — E119 Type 2 diabetes mellitus without complications: Secondary | ICD-10-CM | POA: Diagnosis not present

## 2018-07-23 DIAGNOSIS — C770 Secondary and unspecified malignant neoplasm of lymph nodes of head, face and neck: Secondary | ICD-10-CM

## 2018-07-23 DIAGNOSIS — Z5112 Encounter for antineoplastic immunotherapy: Secondary | ICD-10-CM | POA: Diagnosis not present

## 2018-07-23 DIAGNOSIS — I10 Essential (primary) hypertension: Secondary | ICD-10-CM

## 2018-07-23 DIAGNOSIS — Z95828 Presence of other vascular implants and grafts: Secondary | ICD-10-CM

## 2018-07-23 DIAGNOSIS — H547 Unspecified visual loss: Secondary | ICD-10-CM | POA: Diagnosis not present

## 2018-07-23 LAB — CMP (CANCER CENTER ONLY)
ALBUMIN: 3.9 g/dL (ref 3.5–5.0)
ALT: 23 U/L (ref 0–44)
ANION GAP: 10 (ref 5–15)
AST: 20 U/L (ref 15–41)
Alkaline Phosphatase: 66 U/L (ref 38–126)
BILIRUBIN TOTAL: 0.5 mg/dL (ref 0.3–1.2)
BUN: 10 mg/dL (ref 6–20)
CHLORIDE: 108 mmol/L (ref 98–111)
CO2: 25 mmol/L (ref 22–32)
Calcium: 9.5 mg/dL (ref 8.9–10.3)
Creatinine: 0.87 mg/dL (ref 0.61–1.24)
GFR, Est AFR Am: >60 mL/min (ref >60)
GFR, Estimated: >60 mL/min (ref >60)
GLUCOSE: 201 mg/dL — AB (ref 70–99)
POTASSIUM: 3.9 mmol/L (ref 3.5–5.1)
SODIUM: 143 mmol/L (ref 135–145)
Total Protein: 6.9 g/dL (ref 6.5–8.1)

## 2018-07-23 LAB — CBC WITH DIFFERENTIAL (CANCER CENTER ONLY)
ABS IMMATURE GRANULOCYTES: 0.01 10*3/uL (ref 0.00–0.07)
BASOS ABS: 0.1 10*3/uL (ref 0.0–0.1)
Basophils Relative: 1 %
Eosinophils Absolute: 0.2 10*3/uL (ref 0.0–0.5)
Eosinophils Relative: 3 %
HEMATOCRIT: 37.4 % — AB (ref 39.0–52.0)
HEMOGLOBIN: 12.3 g/dL — AB (ref 13.0–17.0)
IMMATURE GRANULOCYTES: 0 %
LYMPHS PCT: 30 %
Lymphs Abs: 1.6 10*3/uL (ref 0.7–4.0)
MCH: 29.1 pg (ref 26.0–34.0)
MCHC: 32.9 g/dL (ref 30.0–36.0)
MCV: 88.4 fL (ref 80.0–100.0)
MONOS PCT: 13 %
Monocytes Absolute: 0.7 10*3/uL (ref 0.1–1.0)
NEUTROS ABS: 2.8 10*3/uL (ref 1.7–7.7)
NEUTROS PCT: 53 %
NRBC: 0 % (ref 0.0–0.2)
Platelet Count: 227 10*3/uL (ref 150–400)
RBC: 4.23 MIL/uL (ref 4.22–5.81)
RDW: 16.3 % — AB (ref 11.5–15.5)
WBC Count: 5.3 10*3/uL (ref 4.0–10.5)

## 2018-07-23 MED ORDER — SODIUM CHLORIDE 0.9 % IV SOLN
Freq: Once | INTRAVENOUS | Status: AC
  Administered 2018-07-23: 12:00:00 via INTRAVENOUS
  Filled 2018-07-23: qty 250

## 2018-07-23 MED ORDER — DIPHENHYDRAMINE HCL 50 MG/ML IJ SOLN
25.0000 mg | Freq: Once | INTRAMUSCULAR | Status: AC
  Administered 2018-07-23: 25 mg via INTRAVENOUS

## 2018-07-23 MED ORDER — SODIUM CHLORIDE 0.9% FLUSH
10.0000 mL | INTRAVENOUS | Status: DC | PRN
  Administered 2018-07-23: 10 mL via INTRAVENOUS
  Filled 2018-07-23: qty 10

## 2018-07-23 MED ORDER — SODIUM CHLORIDE 0.9% FLUSH
10.0000 mL | INTRAVENOUS | Status: DC | PRN
  Administered 2018-07-23: 10 mL
  Filled 2018-07-23: qty 10

## 2018-07-23 MED ORDER — DIPHENHYDRAMINE HCL 50 MG/ML IJ SOLN
INTRAMUSCULAR | Status: AC
  Filled 2018-07-23: qty 1

## 2018-07-23 MED ORDER — SODIUM CHLORIDE 0.9 % IV SOLN
80.0000 mg/m2 | Freq: Once | INTRAVENOUS | Status: AC
  Administered 2018-07-23: 150 mg via INTRAVENOUS
  Filled 2018-07-23: qty 25

## 2018-07-23 MED ORDER — ACETAMINOPHEN 325 MG PO TABS
ORAL_TABLET | ORAL | Status: AC
  Filled 2018-07-23: qty 2

## 2018-07-23 MED ORDER — HEPARIN SOD (PORK) LOCK FLUSH 100 UNIT/ML IV SOLN
500.0000 [IU] | Freq: Once | INTRAVENOUS | Status: AC | PRN
  Administered 2018-07-23: 500 [IU]
  Filled 2018-07-23: qty 5

## 2018-07-23 MED ORDER — SODIUM CHLORIDE 0.9 % IV SOLN
8.0000 mg/kg | Freq: Once | INTRAVENOUS | Status: AC
  Administered 2018-07-23: 600 mg via INTRAVENOUS
  Filled 2018-07-23: qty 50

## 2018-07-23 MED ORDER — DEXAMETHASONE SODIUM PHOSPHATE 10 MG/ML IJ SOLN
INTRAMUSCULAR | Status: AC
  Filled 2018-07-23: qty 1

## 2018-07-23 MED ORDER — ACETAMINOPHEN 325 MG PO TABS
650.0000 mg | ORAL_TABLET | Freq: Once | ORAL | Status: AC
  Administered 2018-07-23: 650 mg via ORAL

## 2018-07-23 MED ORDER — FAMOTIDINE IN NACL 20-0.9 MG/50ML-% IV SOLN
INTRAVENOUS | Status: AC
  Filled 2018-07-23: qty 50

## 2018-07-23 MED ORDER — DEXAMETHASONE SODIUM PHOSPHATE 10 MG/ML IJ SOLN
10.0000 mg | Freq: Once | INTRAMUSCULAR | Status: AC
  Administered 2018-07-23: 10 mg via INTRAVENOUS

## 2018-07-23 MED ORDER — FAMOTIDINE IN NACL 20-0.9 MG/50ML-% IV SOLN
20.0000 mg | Freq: Once | INTRAVENOUS | Status: AC
  Administered 2018-07-23: 20 mg via INTRAVENOUS

## 2018-07-23 NOTE — Patient Instructions (Signed)
[facility] Discharge Instructions for Patients Receiving Chemotherapy  Today you received the following chemotherapy agents :  Cyramza,  Taxol.  To help prevent nausea and vomiting after your treatment, we encourage you to take your nausea medication as prescribed.   If you develop nausea and vomiting that is not controlled by your nausea medication, call the clinic.   BELOW ARE SYMPTOMS THAT SHOULD BE REPORTED IMMEDIATELY:  *FEVER GREATER THAN 100.5 F  *CHILLS WITH OR WITHOUT FEVER  NAUSEA AND VOMITING THAT IS NOT CONTROLLED WITH YOUR NAUSEA MEDICATION  *UNUSUAL SHORTNESS OF BREATH  *UNUSUAL BRUISING OR BLEEDING  TENDERNESS IN MOUTH AND THROAT WITH OR WITHOUT PRESENCE OF ULCERS  *URINARY PROBLEMS  *BOWEL PROBLEMS  UNUSUAL RASH Items with * indicate a potential emergency and should be followed up as soon as possible.  Feel free to call the clinic should you have any questions or concerns. The clinic phone number is [phone].  Please show the CHEMO ALERT CARD at check-in to the Emergency Department and triage nurse.   

## 2018-07-23 NOTE — Progress Notes (Signed)
Ok to treat today without a urine protein per Dr. Benay Spice

## 2018-07-23 NOTE — Telephone Encounter (Signed)
Appts scheduled avs declined / calendar printed per 10/30 los °

## 2018-07-23 NOTE — Progress Notes (Signed)
[facility] OFFICE PROGRESS NOTE   Diagnosis: Gastric cancer  INTERVAL HISTORY:   Robert Beck returns as scheduled.  He completed another cycle of Taxol/ramucirumab on 07/09/2018.  He reports tolerating the treatment well.  He has mild neuropathy symptoms.  No dysphasia.  No bleeding or symptom of thrombosis.  Objective:  Vital signs in last 24 hours:  Blood pressure (!) 156/91, pulse 77, temperature (!) 97.5 F (36.4 C), temperature source Oral, resp. rate 19, height 5' 6" (1.676 m), weight 162 lb 8 oz (73.7 kg), SpO2 100 %.    HEENT: No thrush, neck without mass Resp: Lungs clear bilaterally Cardio: Regular rate and rhythm GI: No hepatomegaly, nontender, no mass Vascular: No leg edema    Portacath/PICC-without erythema  Lab Results:  Lab Results  Component Value Date   WBC 5.3 07/23/2018   HGB 12.3 (L) 07/23/2018   HCT 37.4 (L) 07/23/2018   MCV 88.4 07/23/2018   PLT 227 07/23/2018   NEUTROABS 2.8 07/23/2018    CMP  Lab Results  Component Value Date   NA 144 07/09/2018   K 3.7 07/09/2018   CL 112 (H) 07/09/2018   CO2 23 07/09/2018   GLUCOSE 53 (L) 07/09/2018   BUN 8 07/09/2018   CREATININE 0.77 07/09/2018   CALCIUM 9.3 07/09/2018   PROT 6.7 07/09/2018   ALBUMIN 3.8 07/09/2018   AST 17 07/09/2018   ALT 18 07/09/2018   ALKPHOS 64 07/09/2018   BILITOT 0.4 07/09/2018   GFRNONAA >60 07/09/2018   GFRAA >60 07/09/2018    Lab Results  Component Value Date   CEA1 3.83 06/27/2016     Medications: I have reviewed the patient's current medications.   Assessment/Plan: 1. Gastric cancer, gastric cardia mass extendingto the GE junction confirmed on endoscopy 04/11/2016, biopsy confirmed adenocarcinoma  Staging CTs of the chest, abdomen, and pelvis 04/09/2016-gastric cardia mass, mediastinal, right hilar, and right supraclavicular lymphadenopathy  Biopsy of a right supraclavicular lymph node on 04/20/2016 revealed metastatic  adenocarcinoma  FoundationACT 02/20/2017-TP53 alteration identified  Cycle 1 FOLFOX 04/30/2016 (oxaliplatin given 04/30/2016, 5-FU infusion started 05/02/2016)  Cycle 2 FOLFOX 05/14/2016  Cycle 3 FOLFOX 05/30/2016  Cycle 4 FOLFOX 06/13/2016  Cycle 5 FOLFOX 06/27/2016  Cycle 6 FOLFOX 07/11/2016  CTs 07/20/2016-decrease in supraclavicular, mediastinal, and gastrohepatic adenopathy. Decreased gastric cardia wall thickening.  Cycle 7 FOLFOX 07/25/2016  Cycle 8 FOLFOX 08/08/2016  Cycle 9 FOLFOX 08/22/2016 (oxaliplatin held due to neuropathy)  Cycle 10 FOLFOX 09/05/2016 (oxaliplatin held)  Cycle 11 FOLFOX 09/26/2016 (oxaliplatin held)  Cycle 12 FOLFOX 10/10/2016 (oxaliplatin held)  CT 10/18/2016-mild enlargement of mediastinal lymph nodes, no other evidence of disease progression, stable thickening at the GE junction and stable gastrohepatic lymphadenopathy  Cycle 13 FOLFOX 10/24/2016 (oxaliplatin held)  Maintenance Xeloda 7 days on/7 days off beginning 11/17/2016  Restaging chest CT 01/22/2017-interval progression of metastatic lymphadenopathy in the mediastinum and gastrohepatic ligament.  Salvage therapy with FOLFIRI initiated 02/20/2017  Cycle 5 FOLFIRI 04/17/2017  Restaging chest CT 04/29/2017-mild improvement in mediastinal adenopathy. No significant change in adenopathy in the gastrohepatic ligament. No evidence of disease progression.  Cycle 6 FOLFIRI 05/01/2017  Cycle 7 FOLFIRI 05/15/2017  Cycle 8 FOLFIRI 05/29/2017  Cycle 9 FOLFIRI 06/19/2017  Cycle 10 FOLFIRI 07/03/2017  Cycle 11 FOLFIRI 07/17/2017  CT chest 07/30/2017-mild decrease in mediastinal and gastrohepatic ligament lymphadenopathy, no evidence of progressive disease  Cycle 12 FOLFIRI 07/31/2017  Cycle 13 FOLFIRI 08/14/2017  Cycle 14 FOLFIRI 09/04/2017  Cycle 15 FOLFIRI 09/25/2017  Cycle  16 FOLFIRI 10/16/2017  Cycle 17 FOLFIRI 11/06/2017  Cycle 18 FOLFIRI 11/27/2017  cycle 19 FOLFIRI  12/18/2017  CT 01/06/2018-stable neck/mediastinal nodes, slight enlargement of gastrohepatic ligament node stable periportal and portacaval nodes  Cycle 20 FOLFIRI 01/08/2018  Cycle 21 FOLFIRI 01/29/2018  CT neck 02/06/2018- 4 cm necrotic right levelIII/IVlymph node similar to the chest CT 01/06/2018, considerably larger than on 07/30/2017. Unchanged adjacent right level IV/supraclavicular lymph node and partially visualized mediastinal lymph nodes.  Cycle 1 Taxol/ramucirumab 03/05/2018  Core biopsy right cervical lymph node 02/28/2018-metastatic adenocarcinoma, HER-2 negative PDL 1 score-60, MSI-stable, tumor mutational burden-10  Cycle 2 Taxol/ramucirumab7/06/2018  Cycle 3 Taxol/ramucirumab 04/30/2018  CTs 05/23/2018- decrease in mediastinal and gastrohepatic ligament adenopathy, decreased right sided level 3/4 node  Cycle 4 Taxol/ramucirumab 05/28/2018  Cycle 5 Taxol/ramucirumab 06/24/2018 (day 1 ramucirumab held secondary to right eye vision loss)  Cycle 6 Taxol/ramucirumab 07/23/2018  2. Anemia secondary to #1-improved  3. Diabetes  4. Gout  5. Hypertension  6. Hyperlipidemia  7. History of a T7 compression fracture  8. Pain secondary to the gastric mass and mediastinal lymphadenopathy-resolved  9. Port-A-Cath placement 04/20/2016  10. 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 1 mm obstructing stone distal right ureter/right ureteral vesicle junction on CT 09/13/2016.  13. 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 Hypercalcemia 12/19/2016-asymptomatic  15. Nausea following FOLFIRI-emend added beginning with cycle 3, trial of Reglan beginning12/08/2017, persistent  16.Erythematous, mildly indurated rash right upper posterior leg in setting of possible  recent tick bitestatus post course of doxycycline.Resolved.  17.Sore throat/left buccal ulcer following cycle 1 Taxol/ramucirumab  18.  Right eye vision loss- etiology unclear, potentially related to diabetes-evaluated by ophthalmology   Disposition: Robert Beck has completed 5 cycles of Taxol/ramucirumab.  There is no evidence of disease progression.  He will complete cycle 6 beginning today.  We will plan for a restaging CT in approximately 2 months.  15 minutes were spent with the patient today.  The majority of the time was used for counseling and coordination of care.  Betsy Coder, MD  07/23/2018  11:00 AM

## 2018-07-24 ENCOUNTER — Other Ambulatory Visit: Payer: Self-pay | Admitting: Internal Medicine

## 2018-07-27 ENCOUNTER — Other Ambulatory Visit: Payer: Self-pay | Admitting: Oncology

## 2018-07-30 ENCOUNTER — Inpatient Hospital Stay: Payer: Medicaid - Out of State | Attending: Oncology

## 2018-07-30 ENCOUNTER — Inpatient Hospital Stay: Payer: Medicaid - Out of State

## 2018-07-30 VITALS — BP 135/83 | HR 83 | Temp 98.2°F | Resp 18

## 2018-07-30 DIAGNOSIS — Z5112 Encounter for antineoplastic immunotherapy: Secondary | ICD-10-CM | POA: Insufficient documentation

## 2018-07-30 DIAGNOSIS — C160 Malignant neoplasm of cardia: Secondary | ICD-10-CM | POA: Insufficient documentation

## 2018-07-30 DIAGNOSIS — Z5111 Encounter for antineoplastic chemotherapy: Secondary | ICD-10-CM | POA: Insufficient documentation

## 2018-07-30 DIAGNOSIS — C770 Secondary and unspecified malignant neoplasm of lymph nodes of head, face and neck: Secondary | ICD-10-CM | POA: Diagnosis not present

## 2018-07-30 DIAGNOSIS — Z95828 Presence of other vascular implants and grafts: Secondary | ICD-10-CM

## 2018-07-30 LAB — CBC WITH DIFFERENTIAL (CANCER CENTER ONLY)
ABS IMMATURE GRANULOCYTES: 0.06 10*3/uL (ref 0.00–0.07)
Basophils Absolute: 0.1 10*3/uL (ref 0.0–0.1)
Basophils Relative: 1 %
Eosinophils Absolute: 0.2 10*3/uL (ref 0.0–0.5)
Eosinophils Relative: 3 %
HCT: 38.5 % — ABNORMAL LOW (ref 39.0–52.0)
HEMOGLOBIN: 12.6 g/dL — AB (ref 13.0–17.0)
IMMATURE GRANULOCYTES: 1 %
LYMPHS PCT: 25 %
Lymphs Abs: 1.6 10*3/uL (ref 0.7–4.0)
MCH: 29 pg (ref 26.0–34.0)
MCHC: 32.7 g/dL (ref 30.0–36.0)
MCV: 88.5 fL (ref 80.0–100.0)
MONOS PCT: 4 %
Monocytes Absolute: 0.3 10*3/uL (ref 0.1–1.0)
NEUTROS ABS: 4.2 10*3/uL (ref 1.7–7.7)
NEUTROS PCT: 66 %
Platelet Count: 256 10*3/uL (ref 150–400)
RBC: 4.35 MIL/uL (ref 4.22–5.81)
RDW: 15.3 % (ref 11.5–15.5)
WBC Count: 6.4 10*3/uL (ref 4.0–10.5)
nRBC: 0 % (ref 0.0–0.2)

## 2018-07-30 LAB — CMP (CANCER CENTER ONLY)
ALT: 19 U/L (ref 0–44)
AST: 17 U/L (ref 15–41)
Albumin: 4 g/dL (ref 3.5–5.0)
Alkaline Phosphatase: 63 U/L (ref 38–126)
Anion gap: 11 (ref 5–15)
BUN: 15 mg/dL (ref 6–20)
CHLORIDE: 108 mmol/L (ref 98–111)
CO2: 25 mmol/L (ref 22–32)
CREATININE: 0.93 mg/dL (ref 0.61–1.24)
Calcium: 9.6 mg/dL (ref 8.9–10.3)
GFR, Est AFR Am: >60 mL/min (ref >60)
GFR, Estimated: >60 mL/min (ref >60)
GLUCOSE: 109 mg/dL — AB (ref 70–99)
Potassium: 3.5 mmol/L (ref 3.5–5.1)
SODIUM: 144 mmol/L (ref 135–145)
Total Bilirubin: 0.5 mg/dL (ref 0.3–1.2)
Total Protein: 7.1 g/dL (ref 6.5–8.1)

## 2018-07-30 LAB — TOTAL PROTEIN, URINE DIPSTICK: PROTEIN: NEGATIVE mg/dL

## 2018-07-30 MED ORDER — SODIUM CHLORIDE 0.9 % IV SOLN
80.0000 mg/m2 | Freq: Once | INTRAVENOUS | Status: AC
  Administered 2018-07-30: 150 mg via INTRAVENOUS
  Filled 2018-07-30: qty 25

## 2018-07-30 MED ORDER — DIPHENHYDRAMINE HCL 50 MG/ML IJ SOLN
25.0000 mg | Freq: Once | INTRAMUSCULAR | Status: AC
  Administered 2018-07-30: 25 mg via INTRAVENOUS

## 2018-07-30 MED ORDER — HEPARIN SOD (PORK) LOCK FLUSH 100 UNIT/ML IV SOLN
500.0000 [IU] | Freq: Once | INTRAVENOUS | Status: AC | PRN
  Administered 2018-07-30: 500 [IU]
  Filled 2018-07-30: qty 5

## 2018-07-30 MED ORDER — SODIUM CHLORIDE 0.9 % IV SOLN
20.0000 mg | Freq: Once | INTRAVENOUS | Status: AC
  Administered 2018-07-30: 20 mg via INTRAVENOUS
  Filled 2018-07-30: qty 2

## 2018-07-30 MED ORDER — DEXAMETHASONE SODIUM PHOSPHATE 10 MG/ML IJ SOLN
10.0000 mg | Freq: Once | INTRAMUSCULAR | Status: AC
  Administered 2018-07-30: 10 mg via INTRAVENOUS

## 2018-07-30 MED ORDER — SODIUM CHLORIDE 0.9 % IV SOLN
Freq: Once | INTRAVENOUS | Status: AC
  Administered 2018-07-30: 09:00:00 via INTRAVENOUS
  Filled 2018-07-30: qty 250

## 2018-07-30 MED ORDER — FAMOTIDINE IN NACL 20-0.9 MG/50ML-% IV SOLN: 20.0000 mg | Freq: Once | INTRAVENOUS | Status: DC

## 2018-07-30 MED ORDER — DEXAMETHASONE SODIUM PHOSPHATE 10 MG/ML IJ SOLN
INTRAMUSCULAR | Status: AC
  Filled 2018-07-30: qty 1

## 2018-07-30 MED ORDER — SODIUM CHLORIDE 0.9% FLUSH
10.0000 mL | INTRAVENOUS | Status: DC | PRN
  Administered 2018-07-30: 10 mL via INTRAVENOUS
  Filled 2018-07-30: qty 10

## 2018-07-30 MED ORDER — SODIUM CHLORIDE 0.9% FLUSH
10.0000 mL | INTRAVENOUS | Status: DC | PRN
  Administered 2018-07-30: 10 mL
  Filled 2018-07-30: qty 10

## 2018-07-30 MED ORDER — DIPHENHYDRAMINE HCL 50 MG/ML IJ SOLN
INTRAMUSCULAR | Status: AC
  Filled 2018-07-30: qty 1

## 2018-07-30 NOTE — Addendum Note (Signed)
Addended by: Carlene Coria L on: 07/30/2018 08:11 AM   Modules accepted: Orders

## 2018-07-30 NOTE — Patient Instructions (Signed)
[facility] Discharge Instructions for Patients Receiving Chemotherapy  Today you received the following chemotherapy agents Paclitaxel  To help prevent nausea and vomiting after your treatment, we encourage you to take your nausea medication as directed   If you develop nausea and vomiting that is not controlled by your nausea medication, call the clinic.   BELOW ARE SYMPTOMS THAT SHOULD BE REPORTED IMMEDIATELY:  *FEVER GREATER THAN 100.5 F  *CHILLS WITH OR WITHOUT FEVER  NAUSEA AND VOMITING THAT IS NOT CONTROLLED WITH YOUR NAUSEA MEDICATION  *UNUSUAL SHORTNESS OF BREATH  *UNUSUAL BRUISING OR BLEEDING  TENDERNESS IN MOUTH AND THROAT WITH OR WITHOUT PRESENCE OF ULCERS  *URINARY PROBLEMS  *BOWEL PROBLEMS  UNUSUAL RASH Items with * indicate a potential emergency and should be followed up as soon as possible.  Feel free to call the clinic should you have any questions or concerns. The clinic phone number is [phone].  Please show the CHEMO ALERT CARD at check-in to the Emergency Department and triage nurse.   

## 2018-08-01 ENCOUNTER — Other Ambulatory Visit: Payer: Self-pay | Admitting: *Deleted

## 2018-08-01 MED ORDER — POTASSIUM CHLORIDE ER 10 MEQ PO CPCR: 10.0000 meq | ORAL_CAPSULE | Freq: Every day | ORAL | 0 refills | Status: DC

## 2018-08-02 ENCOUNTER — Other Ambulatory Visit: Payer: Self-pay | Admitting: Oncology

## 2018-08-06 ENCOUNTER — Inpatient Hospital Stay: Payer: Medicaid - Out of State

## 2018-08-06 VITALS — BP 157/91 | HR 88 | Temp 98.3°F | Resp 18

## 2018-08-06 DIAGNOSIS — Z5111 Encounter for antineoplastic chemotherapy: Secondary | ICD-10-CM | POA: Diagnosis not present

## 2018-08-06 DIAGNOSIS — C160 Malignant neoplasm of cardia: Secondary | ICD-10-CM

## 2018-08-06 LAB — CBC WITH DIFFERENTIAL (CANCER CENTER ONLY)
ABS IMMATURE GRANULOCYTES: 0.02 10*3/uL (ref 0.00–0.07)
BASOS ABS: 0 10*3/uL (ref 0.0–0.1)
Basophils Relative: 1 %
Eosinophils Absolute: 0.2 10*3/uL (ref 0.0–0.5)
Eosinophils Relative: 4 %
HCT: 38.2 % — ABNORMAL LOW (ref 39.0–52.0)
Hemoglobin: 12.4 g/dL — ABNORMAL LOW (ref 13.0–17.0)
IMMATURE GRANULOCYTES: 1 %
LYMPHS ABS: 1.3 10*3/uL (ref 0.7–4.0)
LYMPHS PCT: 36 %
MCH: 28.7 pg (ref 26.0–34.0)
MCHC: 32.5 g/dL (ref 30.0–36.0)
MCV: 88.4 fL (ref 80.0–100.0)
Monocytes Absolute: 0.2 10*3/uL (ref 0.1–1.0)
Monocytes Relative: 5 %
NEUTROS ABS: 1.9 10*3/uL (ref 1.7–7.7)
NRBC: 0 % (ref 0.0–0.2)
Neutrophils Relative %: 53 %
PLATELETS: 209 10*3/uL (ref 150–400)
RBC: 4.32 MIL/uL (ref 4.22–5.81)
RDW: 15.8 % — ABNORMAL HIGH (ref 11.5–15.5)
WBC: 3.6 10*3/uL — AB (ref 4.0–10.5)

## 2018-08-06 MED ORDER — SODIUM CHLORIDE 0.9 % IV SOLN
80.0000 mg/m2 | Freq: Once | INTRAVENOUS | Status: AC
  Administered 2018-08-06: 150 mg via INTRAVENOUS
  Filled 2018-08-06: qty 25

## 2018-08-06 MED ORDER — SODIUM CHLORIDE 0.9% FLUSH
10.0000 mL | INTRAVENOUS | Status: DC | PRN
  Administered 2018-08-06: 10 mL
  Filled 2018-08-06: qty 10

## 2018-08-06 MED ORDER — DEXAMETHASONE SODIUM PHOSPHATE 10 MG/ML IJ SOLN
INTRAMUSCULAR | Status: AC
  Filled 2018-08-06: qty 1

## 2018-08-06 MED ORDER — DEXAMETHASONE SODIUM PHOSPHATE 10 MG/ML IJ SOLN
10.0000 mg | Freq: Once | INTRAMUSCULAR | Status: AC
  Administered 2018-08-06: 10 mg via INTRAVENOUS

## 2018-08-06 MED ORDER — SODIUM CHLORIDE 0.9 % IV SOLN
20.0000 mg | Freq: Once | INTRAVENOUS | Status: AC
  Administered 2018-08-06: 20 mg via INTRAVENOUS
  Filled 2018-08-06: qty 2

## 2018-08-06 MED ORDER — FAMOTIDINE IN NACL 20-0.9 MG/50ML-% IV SOLN: 20.0000 mg | Freq: Once | INTRAVENOUS | Status: DC

## 2018-08-06 MED ORDER — ACETAMINOPHEN 325 MG PO TABS
650.0000 mg | ORAL_TABLET | Freq: Once | ORAL | Status: AC
  Administered 2018-08-06: 650 mg via ORAL

## 2018-08-06 MED ORDER — SODIUM CHLORIDE 0.9 % IV SOLN
8.0000 mg/kg | Freq: Once | INTRAVENOUS | Status: AC
  Administered 2018-08-06: 600 mg via INTRAVENOUS
  Filled 2018-08-06: qty 10

## 2018-08-06 MED ORDER — ACETAMINOPHEN 325 MG PO TABS
ORAL_TABLET | ORAL | Status: AC
  Filled 2018-08-06: qty 2

## 2018-08-06 MED ORDER — DIPHENHYDRAMINE HCL 50 MG/ML IJ SOLN
25.0000 mg | Freq: Once | INTRAMUSCULAR | Status: AC
  Administered 2018-08-06: 25 mg via INTRAVENOUS

## 2018-08-06 MED ORDER — SODIUM CHLORIDE 0.9 % IV SOLN
Freq: Once | INTRAVENOUS | Status: AC
  Administered 2018-08-06: 09:00:00 via INTRAVENOUS
  Filled 2018-08-06: qty 250

## 2018-08-06 MED ORDER — HEPARIN SOD (PORK) LOCK FLUSH 100 UNIT/ML IV SOLN
500.0000 [IU] | Freq: Once | INTRAVENOUS | Status: AC | PRN
  Administered 2018-08-06: 500 [IU]
  Filled 2018-08-06: qty 5

## 2018-08-06 MED ORDER — DIPHENHYDRAMINE HCL 50 MG/ML IJ SOLN
INTRAMUSCULAR | Status: AC
  Filled 2018-08-06: qty 1

## 2018-08-06 NOTE — Progress Notes (Signed)
Ok to treat with CBC only today per MD Benay Spice

## 2018-08-06 NOTE — Patient Instructions (Signed)
[facility] Discharge Instructions for Patients Receiving Chemotherapy  Today you received the following chemotherapy agents Cyramza and Taxol  To help prevent nausea and vomiting after your treatment, we encourage you to take your nausea medication as directed.    If you develop nausea and vomiting that is not controlled by your nausea medication, call the clinic.   BELOW ARE SYMPTOMS THAT SHOULD BE REPORTED IMMEDIATELY:  *FEVER GREATER THAN 100.5 F  *CHILLS WITH OR WITHOUT FEVER  NAUSEA AND VOMITING THAT IS NOT CONTROLLED WITH YOUR NAUSEA MEDICATION  *UNUSUAL SHORTNESS OF BREATH  *UNUSUAL BRUISING OR BLEEDING  TENDERNESS IN MOUTH AND THROAT WITH OR WITHOUT PRESENCE OF ULCERS  *URINARY PROBLEMS  *BOWEL PROBLEMS  UNUSUAL RASH Items with * indicate a potential emergency and should be followed up as soon as possible.  Feel free to call the clinic should you have any questions or concerns. The clinic phone number is [phone].  Please show the CHEMO ALERT CARD at check-in to the Emergency Department and triage nurse.   

## 2018-08-06 NOTE — Patient Instructions (Signed)
Implanted Port Home Guide An implanted port is a type of central line that is placed under the skin. Central lines are used to provide IV access when treatment or nutrition needs to be given through a person's veins. Implanted ports are used for long-term IV access. An implanted port may be placed because:  You need IV medicine that would be irritating to the small veins in your hands or arms.  You need long-term IV medicines, such as antibiotics.  You need IV nutrition for a long period.  You need frequent blood draws for lab tests.  You need dialysis.  Implanted ports are usually placed in the chest area, but they can also be placed in the upper arm, the abdomen, or the leg. An implanted port has two main parts:  Reservoir. The reservoir is round and will appear as a small, raised area under your skin. The reservoir is the part where a needle is inserted to give medicines or draw blood.  Catheter. The catheter is a thin, flexible tube that extends from the reservoir. The catheter is placed into a large vein. Medicine that is inserted into the reservoir goes into the catheter and then into the vein.  How will I care for my incision site? Do not get the incision site wet. Bathe or shower as directed by your health care provider. How is my port accessed? Special steps must be taken to access the port:  Before the port is accessed, a numbing cream can be placed on the skin. This helps numb the skin over the port site.  Your health care provider uses a sterile technique to access the port. ? Your health care provider must put on a mask and sterile gloves. ? The skin over your port is cleaned carefully with an antiseptic and allowed to dry. ? The port is gently pinched between sterile gloves, and a needle is inserted into the port.  Only "non-coring" port needles should be used to access the port. Once the port is accessed, a blood return should be checked. This helps ensure that the port  is in the vein and is not clogged.  If your port needs to remain accessed for a constant infusion, a clear (transparent) bandage will be placed over the needle site. The bandage and needle will need to be changed every week, or as directed by your health care provider.  Keep the bandage covering the needle clean and dry. Do not get it wet. Follow your health care provider's instructions on how to take a shower or bath while the port is accessed.  If your port does not need to stay accessed, no bandage is needed over the port.  What is flushing? Flushing helps keep the port from getting clogged. Follow your health care provider's instructions on how and when to flush the port. Ports are usually flushed with saline solution or a medicine called heparin. The need for flushing will depend on how the port is used.  If the port is used for intermittent medicines or blood draws, the port will need to be flushed: ? After medicines have been given. ? After blood has been drawn. ? As part of routine maintenance.  If a constant infusion is running, the port may not need to be flushed.  How long will my port stay implanted? The port can stay in for as long as your health care provider thinks it is needed. When it is time for the port to come out, surgery will be   done to remove it. The procedure is similar to the one performed when the port was put in. When should I seek immediate medical care? When you have an implanted port, you should seek immediate medical care if:  You notice a bad smell coming from the incision site.  You have swelling, redness, or drainage at the incision site.  You have more swelling or pain at the port site or the surrounding area.  You have a fever that is not controlled with medicine.  This information is not intended to replace advice given to you by your health care provider. Make sure you discuss any questions you have with your health care provider. Document  Released: 09/10/2005 Document Revised: 02/16/2016 Document Reviewed: 05/18/2013 Elsevier Interactive Patient Education  2017 Elsevier Inc.  

## 2018-08-11 ENCOUNTER — Encounter (INDEPENDENT_AMBULATORY_CARE_PROVIDER_SITE_OTHER): Payer: Medicaid Other | Admitting: Ophthalmology

## 2018-08-14 NOTE — Progress Notes (Signed)
o

## 2018-08-17 ENCOUNTER — Other Ambulatory Visit: Payer: Self-pay | Admitting: Oncology

## 2018-08-19 ENCOUNTER — Telehealth: Payer: Self-pay

## 2018-08-19 ENCOUNTER — Inpatient Hospital Stay: Payer: Medicaid - Out of State

## 2018-08-19 ENCOUNTER — Inpatient Hospital Stay (HOSPITAL_BASED_OUTPATIENT_CLINIC_OR_DEPARTMENT_OTHER): Payer: Medicaid - Out of State | Admitting: Oncology

## 2018-08-19 VITALS — BP 140/80 | HR 95 | Resp 18

## 2018-08-19 VITALS — BP 156/108 | HR 88 | Temp 97.7°F | Resp 17 | Ht 66.0 in | Wt 163.2 lb

## 2018-08-19 DIAGNOSIS — E119 Type 2 diabetes mellitus without complications: Secondary | ICD-10-CM

## 2018-08-19 DIAGNOSIS — G620 Drug-induced polyneuropathy: Secondary | ICD-10-CM

## 2018-08-19 DIAGNOSIS — C160 Malignant neoplasm of cardia: Secondary | ICD-10-CM

## 2018-08-19 DIAGNOSIS — H5461 Unqualified visual loss, right eye, normal vision left eye: Secondary | ICD-10-CM | POA: Diagnosis not present

## 2018-08-19 DIAGNOSIS — Z95828 Presence of other vascular implants and grafts: Secondary | ICD-10-CM

## 2018-08-19 DIAGNOSIS — Z5111 Encounter for antineoplastic chemotherapy: Secondary | ICD-10-CM | POA: Diagnosis not present

## 2018-08-19 DIAGNOSIS — C770 Secondary and unspecified malignant neoplasm of lymph nodes of head, face and neck: Secondary | ICD-10-CM

## 2018-08-19 DIAGNOSIS — R5381 Other malaise: Secondary | ICD-10-CM

## 2018-08-19 DIAGNOSIS — I10 Essential (primary) hypertension: Secondary | ICD-10-CM

## 2018-08-19 DIAGNOSIS — T451X5A Adverse effect of antineoplastic and immunosuppressive drugs, initial encounter: Secondary | ICD-10-CM

## 2018-08-19 LAB — CMP (CANCER CENTER ONLY)
ALK PHOS: 74 U/L (ref 38–126)
ALT: 18 U/L (ref 0–44)
AST: 23 U/L (ref 15–41)
Albumin: 4.2 g/dL (ref 3.5–5.0)
Anion gap: 11 (ref 5–15)
BILIRUBIN TOTAL: 0.6 mg/dL (ref 0.3–1.2)
BUN: 9 mg/dL (ref 6–20)
CALCIUM: 9.9 mg/dL (ref 8.9–10.3)
CO2: 23 mmol/L (ref 22–32)
Chloride: 110 mmol/L (ref 98–111)
Creatinine: 0.85 mg/dL (ref 0.61–1.24)
GFR, Est AFR Am: >60 mL/min (ref >60)
Glucose, Bld: 67 mg/dL — ABNORMAL LOW (ref 70–99)
Potassium: 3.6 mmol/L (ref 3.5–5.1)
Sodium: 144 mmol/L (ref 135–145)
TOTAL PROTEIN: 7.6 g/dL (ref 6.5–8.1)

## 2018-08-19 LAB — CBC WITH DIFFERENTIAL (CANCER CENTER ONLY)
ABS IMMATURE GRANULOCYTES: 0.02 10*3/uL (ref 0.00–0.07)
BASOS PCT: 1 %
Basophils Absolute: 0.1 10*3/uL (ref 0.0–0.1)
EOS ABS: 0.2 10*3/uL (ref 0.0–0.5)
Eosinophils Relative: 2 %
HCT: 40.6 % (ref 39.0–52.0)
Hemoglobin: 13.2 g/dL (ref 13.0–17.0)
IMMATURE GRANULOCYTES: 0 %
Lymphocytes Relative: 33 %
Lymphs Abs: 2.3 10*3/uL (ref 0.7–4.0)
MCH: 28.8 pg (ref 26.0–34.0)
MCHC: 32.5 g/dL (ref 30.0–36.0)
MCV: 88.6 fL (ref 80.0–100.0)
MONO ABS: 1.1 10*3/uL — AB (ref 0.1–1.0)
MONOS PCT: 15 %
NEUTROS PCT: 49 %
Neutro Abs: 3.4 10*3/uL (ref 1.7–7.7)
PLATELETS: 331 10*3/uL (ref 150–400)
RBC: 4.58 MIL/uL (ref 4.22–5.81)
RDW: 17 % — AB (ref 11.5–15.5)
WBC: 7 10*3/uL (ref 4.0–10.5)
nRBC: 0 % (ref 0.0–0.2)

## 2018-08-19 LAB — TOTAL PROTEIN, URINE DIPSTICK: Protein, ur: NEGATIVE mg/dL

## 2018-08-19 MED ORDER — DEXAMETHASONE SODIUM PHOSPHATE 10 MG/ML IJ SOLN
10.0000 mg | Freq: Once | INTRAMUSCULAR | Status: AC
  Administered 2018-08-19: 10 mg via INTRAVENOUS

## 2018-08-19 MED ORDER — HEPARIN SOD (PORK) LOCK FLUSH 100 UNIT/ML IV SOLN
500.0000 [IU] | Freq: Once | INTRAVENOUS | Status: AC | PRN
  Administered 2018-08-19: 500 [IU]
  Filled 2018-08-19: qty 5

## 2018-08-19 MED ORDER — ACETAMINOPHEN 325 MG PO TABS
ORAL_TABLET | ORAL | Status: AC
  Filled 2018-08-19: qty 1

## 2018-08-19 MED ORDER — FAMOTIDINE IN NACL 20-0.9 MG/50ML-% IV SOLN
20.0000 mg | Freq: Once | INTRAVENOUS | Status: AC
  Administered 2018-08-19: 20 mg via INTRAVENOUS

## 2018-08-19 MED ORDER — SODIUM CHLORIDE 0.9 % IV SOLN
8.0000 mg/kg | Freq: Once | INTRAVENOUS | Status: AC
  Administered 2018-08-19: 600 mg via INTRAVENOUS
  Filled 2018-08-19: qty 50

## 2018-08-19 MED ORDER — SODIUM CHLORIDE 0.9 % IV SOLN
80.0000 mg/m2 | Freq: Once | INTRAVENOUS | Status: AC
  Administered 2018-08-19: 150 mg via INTRAVENOUS
  Filled 2018-08-19: qty 25

## 2018-08-19 MED ORDER — SODIUM CHLORIDE 0.9% FLUSH
10.0000 mL | INTRAVENOUS | Status: DC | PRN
  Administered 2018-08-19: 10 mL via INTRAVENOUS
  Filled 2018-08-19: qty 10

## 2018-08-19 MED ORDER — DIPHENHYDRAMINE HCL 50 MG/ML IJ SOLN
25.0000 mg | Freq: Once | INTRAMUSCULAR | Status: AC
  Administered 2018-08-19: 25 mg via INTRAVENOUS

## 2018-08-19 MED ORDER — ACETAMINOPHEN 325 MG PO TABS
ORAL_TABLET | ORAL | Status: AC
  Filled 2018-08-19: qty 2

## 2018-08-19 MED ORDER — SODIUM CHLORIDE 0.9% FLUSH
10.0000 mL | INTRAVENOUS | Status: DC | PRN
  Administered 2018-08-19: 10 mL
  Filled 2018-08-19: qty 10

## 2018-08-19 MED ORDER — ACETAMINOPHEN 325 MG PO TABS
650.0000 mg | ORAL_TABLET | Freq: Once | ORAL | Status: AC
  Administered 2018-08-19: 650 mg via ORAL

## 2018-08-19 MED ORDER — FAMOTIDINE IN NACL 20-0.9 MG/50ML-% IV SOLN
INTRAVENOUS | Status: AC
  Filled 2018-08-19: qty 50

## 2018-08-19 MED ORDER — SODIUM CHLORIDE 0.9 % IV SOLN
Freq: Once | INTRAVENOUS | Status: AC
  Administered 2018-08-19: 10:00:00 via INTRAVENOUS
  Filled 2018-08-19: qty 250

## 2018-08-19 MED ORDER — DIPHENHYDRAMINE HCL 50 MG/ML IJ SOLN
INTRAMUSCULAR | Status: AC
  Filled 2018-08-19: qty 1

## 2018-08-19 MED ORDER — DEXAMETHASONE SODIUM PHOSPHATE 10 MG/ML IJ SOLN
INTRAMUSCULAR | Status: AC
  Filled 2018-08-19: qty 1

## 2018-08-19 NOTE — Progress Notes (Signed)
[place] OFFICE PROGRESS NOTE   Diagnosis: Gastric cancer  INTERVAL HISTORY:   [name] completed another cycle of Taxol/ramucirumab on 08/06/2018.  No change in neuropathy symptoms.  He had soreness in the mouth following chemotherapy.  This has resolved.  He reports increased malaise.  No other complaint.  Objective:  Vital signs in last 24 hours:  Blood pressure (!) 156/108, pulse 88, temperature 97.7 F (36.5 C), temperature source Oral, resp. rate 17, height '5\' 6"'  (1.676 m), weight 163 lb 3.2 oz (74 kg), SpO2 99 %.    HEENT: No thrush or ulcers Lymphatics: No cervical or supraclavicular nodes Resp: Lungs clear bilaterally Cardio: Regular rate and rhythm GI: No hepatomegaly, nontender Vascular: No leg edema  Portacath/PICC-without erythema  Lab Results:  Lab Results  Component Value Date   WBC 7.0 08/19/2018   HGB 13.2 08/19/2018   HCT 40.6 08/19/2018   MCV 88.6 08/19/2018   PLT 331 08/19/2018   NEUTROABS 3.4 08/19/2018    CMP  Lab Results  Component Value Date   NA 144 07/30/2018   K 3.5 07/30/2018   CL 108 07/30/2018   CO2 25 07/30/2018   GLUCOSE 109 (H) 07/30/2018   BUN 15 07/30/2018   CREATININE 0.93 07/30/2018   CALCIUM 9.6 07/30/2018   PROT 7.1 07/30/2018   ALBUMIN 4.0 07/30/2018   AST 17 07/30/2018   ALT 19 07/30/2018   ALKPHOS 63 07/30/2018   BILITOT 0.5 07/30/2018   GFRNONAA >60 07/30/2018   GFRAA >60 07/30/2018    Lab Results  Component Value Date   CEA1 3.83 06/27/2016     Medications: I have reviewed the patient's current medications.   Assessment/Plan: 1. Gastric cancer, gastric cardia mass extendingto the GE junction confirmed on endoscopy 04/11/2016, biopsy confirmed adenocarcinoma  Staging CTs of the chest, abdomen, and pelvis 04/09/2016-gastric cardia mass, mediastinal, right hilar, and right supraclavicular lymphadenopathy  Biopsy of a right supraclavicular lymph node on 04/20/2016 revealed  metastatic adenocarcinoma  FoundationACT 02/20/2017-TP53 alteration identified  Cycle 1 FOLFOX 04/30/2016 (oxaliplatin given 04/30/2016, 5-FU infusion started 05/02/2016)  Cycle 2 FOLFOX 05/14/2016  Cycle 3 FOLFOX 05/30/2016  Cycle 4 FOLFOX 06/13/2016  Cycle 5 FOLFOX 06/27/2016  Cycle 6 FOLFOX 07/11/2016  CTs 07/20/2016-decrease in supraclavicular, mediastinal, and gastrohepatic adenopathy. Decreased gastric cardia wall thickening.  Cycle 7 FOLFOX 07/25/2016  Cycle 8 FOLFOX 08/08/2016  Cycle 9 FOLFOX 08/22/2016 (oxaliplatin held due to neuropathy)  Cycle 10 FOLFOX 09/05/2016 (oxaliplatin held)  Cycle 11 FOLFOX 09/26/2016 (oxaliplatin held)  Cycle 12 FOLFOX 10/10/2016 (oxaliplatin held)  CT 10/18/2016-mild enlargement of mediastinal lymph nodes, no other evidence of disease progression, stable thickening at the GE junction and stable gastrohepatic lymphadenopathy  Cycle 13 FOLFOX 10/24/2016 (oxaliplatin held)  Maintenance Xeloda 7 days on/7 days off beginning 11/17/2016  Restaging chest CT 01/22/2017-interval progression of metastatic lymphadenopathy in the mediastinum and gastrohepatic ligament.  Salvage therapy with FOLFIRI initiated 02/20/2017  Cycle 5 FOLFIRI 04/17/2017  Restaging chest CT 04/29/2017-mild improvement in mediastinal adenopathy. No significant change in adenopathy in the gastrohepatic ligament. No evidence of disease progression.  Cycle 6 FOLFIRI 05/01/2017  Cycle 7 FOLFIRI 05/15/2017  Cycle 8 FOLFIRI 05/29/2017  Cycle 9 FOLFIRI 06/19/2017  Cycle 10 FOLFIRI 07/03/2017  Cycle 11 FOLFIRI 07/17/2017  CT chest 07/30/2017-mild decrease in mediastinal and gastrohepatic ligament lymphadenopathy, no evidence of progressive disease  Cycle 12 FOLFIRI 07/31/2017  Cycle 13 FOLFIRI 08/14/2017  Cycle 14 FOLFIRI 09/04/2017  Cycle 15 FOLFIRI 09/25/2017  Cycle 16 FOLFIRI 10/16/2017  Cycle 17 FOLFIRI 11/06/2017  Cycle 18 FOLFIRI 11/27/2017  cycle 19  FOLFIRI 12/18/2017  CT 01/06/2018-stable neck/mediastinal nodes, slight enlargement of gastrohepatic ligament node stable periportal and portacaval nodes  Cycle 20 FOLFIRI 01/08/2018  Cycle 21 FOLFIRI 01/29/2018  CT neck 02/06/2018- 4 cm necrotic right levelIII/IVlymph node similar to the chest CT 01/06/2018, considerably larger than on 07/30/2017. Unchanged adjacent right level IV/supraclavicular lymph node and partially visualized mediastinal lymph nodes.  Cycle 1 Taxol/ramucirumab 03/05/2018  Core biopsy right cervical lymph node 02/28/2018-metastatic adenocarcinoma, HER-2 negative PDL 1 score-60, MSI-stable, tumor mutational burden-10  Cycle 2 Taxol/ramucirumab7/06/2018  Cycle 3 Taxol/ramucirumab 04/30/2018  CTs 05/23/2018- decrease in mediastinal and gastrohepatic ligament adenopathy, decreased right sided level 3/4 node  Cycle 4 Taxol/ramucirumab 05/28/2018  Cycle 5 Taxol/ramucirumab 06/24/2018 (day 1 ramucirumab held secondary to right eye vision loss)  Cycle 6 Taxol/ramucirumab 07/23/2018  Cycle 7 Taxol/ramucirumab 08/19/2018  2. Anemia secondary to #1-improved  3. Diabetes  4. Gout  5. Hypertension  6. Hyperlipidemia  7. History of a T7 compression fracture  8. Pain secondary to the gastric mass and mediastinal lymphadenopathy-resolved  9. Port-A-Cath placement 04/20/2016  10. 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 1 mm obstructing stone distal right ureter/right ureteral vesicle junction on CT 09/13/2016.  13. 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 Hypercalcemia 12/19/2016-asymptomatic  15. Nausea following FOLFIRI-emend added beginning with cycle 3, trial of Reglan beginning12/08/2017, persistent  16.Erythematous, mildly indurated rash  right upper posterior leg in setting of possible recent tick bitestatus post course of doxycycline.Resolved.  17.Sore throat/left buccal ulcer following cycle 1 Taxol/ramucirumab  18.Right eye vision loss- etiology unclear, potentially related to diabetes-evaluated by ophthalmology    Disposition: Mr. Claunch are stable.  He will get another cycle of Taxol/ramucirumab today.  He will return for an office visit prior to the next cycle of chemotherapy 09/16/2018.  We will plan for a restaging CT evaluation in approximately 2 months.  We will repeat his blood pressure in the chemotherapy room today.  Betsy Coder, MD  08/19/2018  8:47 AM

## 2018-08-19 NOTE — Telephone Encounter (Signed)
Printed avs and calender of upcoming appointrment. Per 11/26 los

## 2018-08-19 NOTE — Patient Instructions (Signed)
Adult nurse for any assistance needed on your Advanced Directive Monitor you BP at home

## 2018-08-19 NOTE — Patient Instructions (Signed)
[place] Discharge Instructions for Patients Receiving Chemotherapy  Today you received the following chemotherapy agents ramucirumab (Cyramza) and paclitaxel (Taxol)   To help prevent nausea and vomiting after your treatment, we encourage you to take your nausea medication as directed.    If you develop nausea and vomiting that is not controlled by your nausea medication, call the clinic.   BELOW ARE SYMPTOMS THAT SHOULD BE REPORTED IMMEDIATELY:  *FEVER GREATER THAN 100.5 F  *CHILLS WITH OR WITHOUT FEVER  NAUSEA AND VOMITING THAT IS NOT CONTROLLED WITH YOUR NAUSEA MEDICATION  *UNUSUAL SHORTNESS OF BREATH  *UNUSUAL BRUISING OR BLEEDING  TENDERNESS IN MOUTH AND THROAT WITH OR WITHOUT PRESENCE OF ULCERS  *URINARY PROBLEMS  *BOWEL PROBLEMS  UNUSUAL RASH Items with * indicate a potential emergency and should be followed up as soon as possible.  Feel free to call the clinic should you have any questions or concerns. The clinic phone number is [phone].  Please show the [place] at check-in to the Emergency Department and triage nurse.

## 2018-08-19 NOTE — Patient Instructions (Signed)
Implanted Port Home Guide An implanted port is a type of central line that is placed under the skin. Central lines are used to provide IV access when treatment or nutrition needs to be given through a person's veins. Implanted ports are used for long-term IV access. An implanted port may be placed because:  You need IV medicine that would be irritating to the small veins in your hands or arms.  You need long-term IV medicines, such as antibiotics.  You need IV nutrition for a long period.  You need frequent blood draws for lab tests.  You need dialysis.  Implanted ports are usually placed in the chest area, but they can also be placed in the upper arm, the abdomen, or the leg. An implanted port has two main parts:  Reservoir. The reservoir is round and will appear as a small, raised area under your skin. The reservoir is the part where a needle is inserted to give medicines or draw blood.  Catheter. The catheter is a thin, flexible tube that extends from the reservoir. The catheter is placed into a large vein. Medicine that is inserted into the reservoir goes into the catheter and then into the vein.  How will I care for my incision site? Do not get the incision site wet. Bathe or shower as directed by your health care provider. How is my port accessed? Special steps must be taken to access the port:  Before the port is accessed, a numbing cream can be placed on the skin. This helps numb the skin over the port site.  Your health care provider uses a sterile technique to access the port. ? Your health care provider must put on a mask and sterile gloves. ? The skin over your port is cleaned carefully with an antiseptic and allowed to dry. ? The port is gently pinched between sterile gloves, and a needle is inserted into the port.  Only "non-coring" port needles should be used to access the port. Once the port is accessed, a blood return should be checked. This helps ensure that the port  is in the vein and is not clogged.  If your port needs to remain accessed for a constant infusion, a clear (transparent) bandage will be placed over the needle site. The bandage and needle will need to be changed every week, or as directed by your health care provider.  Keep the bandage covering the needle clean and dry. Do not get it wet. Follow your health care provider's instructions on how to take a shower or bath while the port is accessed.  If your port does not need to stay accessed, no bandage is needed over the port.  What is flushing? Flushing helps keep the port from getting clogged. Follow your health care provider's instructions on how and when to flush the port. Ports are usually flushed with saline solution or a medicine called heparin. The need for flushing will depend on how the port is used.  If the port is used for intermittent medicines or blood draws, the port will need to be flushed: ? After medicines have been given. ? After blood has been drawn. ? As part of routine maintenance.  If a constant infusion is running, the port may not need to be flushed.  How long will my port stay implanted? The port can stay in for as long as your health care provider thinks it is needed. When it is time for the port to come out, surgery will be   done to remove it. The procedure is similar to the one performed when the port was put in. When should I seek immediate medical care? When you have an implanted port, you should seek immediate medical care if:  You notice a bad smell coming from the incision site.  You have swelling, redness, or drainage at the incision site.  You have more swelling or pain at the port site or the surrounding area.  You have a fever that is not controlled with medicine.  This information is not intended to replace advice given to you by your health care provider. Make sure you discuss any questions you have with your health care provider. Document  Released: 09/10/2005 Document Revised: 02/16/2016 Document Reviewed: 05/18/2013 Elsevier Interactive Patient Education  2017 Elsevier Inc.  

## 2018-08-27 ENCOUNTER — Inpatient Hospital Stay: Payer: Medicaid Other

## 2018-08-27 ENCOUNTER — Inpatient Hospital Stay: Payer: Medicaid Other | Attending: Oncology

## 2018-08-27 VITALS — BP 156/90 | HR 88 | Temp 98.9°F | Resp 16

## 2018-08-27 DIAGNOSIS — C770 Secondary and unspecified malignant neoplasm of lymph nodes of head, face and neck: Secondary | ICD-10-CM | POA: Insufficient documentation

## 2018-08-27 DIAGNOSIS — Z5111 Encounter for antineoplastic chemotherapy: Secondary | ICD-10-CM | POA: Diagnosis present

## 2018-08-27 DIAGNOSIS — C160 Malignant neoplasm of cardia: Secondary | ICD-10-CM | POA: Insufficient documentation

## 2018-08-27 DIAGNOSIS — Z5112 Encounter for antineoplastic immunotherapy: Secondary | ICD-10-CM | POA: Diagnosis present

## 2018-08-27 LAB — CBC WITH DIFFERENTIAL (CANCER CENTER ONLY)
Abs Immature Granulocytes: 0.11 10*3/uL — ABNORMAL HIGH (ref 0.00–0.07)
BASOS ABS: 0.1 10*3/uL (ref 0.0–0.1)
BASOS PCT: 1 %
EOS ABS: 0.2 10*3/uL (ref 0.0–0.5)
Eosinophils Relative: 4 %
HCT: 36.1 % — ABNORMAL LOW (ref 39.0–52.0)
Hemoglobin: 12 g/dL — ABNORMAL LOW (ref 13.0–17.0)
IMMATURE GRANULOCYTES: 2 %
Lymphocytes Relative: 26 %
Lymphs Abs: 1.7 10*3/uL (ref 0.7–4.0)
MCH: 29.1 pg (ref 26.0–34.0)
MCHC: 33.2 g/dL (ref 30.0–36.0)
MCV: 87.6 fL (ref 80.0–100.0)
Monocytes Absolute: 0.3 10*3/uL (ref 0.1–1.0)
Monocytes Relative: 4 %
NEUTROS PCT: 63 %
NRBC: 1.1 % — AB (ref 0.0–0.2)
Neutro Abs: 4.2 10*3/uL (ref 1.7–7.7)
PLATELETS: 240 10*3/uL (ref 150–400)
RBC: 4.12 MIL/uL — AB (ref 4.22–5.81)
RDW: 16.2 % — AB (ref 11.5–15.5)
WBC: 6.6 10*3/uL (ref 4.0–10.5)

## 2018-08-27 LAB — COMPREHENSIVE METABOLIC PANEL
ALBUMIN: 3.9 g/dL (ref 3.5–5.0)
ALT: 18 U/L (ref 0–44)
ANION GAP: 10 (ref 5–15)
AST: 23 U/L (ref 15–41)
Alkaline Phosphatase: 67 U/L (ref 38–126)
BUN: 9 mg/dL (ref 6–20)
CHLORIDE: 111 mmol/L (ref 98–111)
CO2: 22 mmol/L (ref 22–32)
Calcium: 9.6 mg/dL (ref 8.9–10.3)
Creatinine, Ser: 0.84 mg/dL (ref 0.61–1.24)
GFR calc Af Amer: >60 mL/min (ref >60)
GFR calc non Af Amer: >60 mL/min (ref >60)
GLUCOSE: 105 mg/dL — AB (ref 70–99)
POTASSIUM: 4.1 mmol/L (ref 3.5–5.1)
SODIUM: 143 mmol/L (ref 135–145)
Total Bilirubin: 0.4 mg/dL (ref 0.3–1.2)
Total Protein: 7.2 g/dL (ref 6.5–8.1)

## 2018-08-27 MED ORDER — DIPHENHYDRAMINE HCL 50 MG/ML IJ SOLN
25.0000 mg | Freq: Once | INTRAMUSCULAR | Status: AC
  Administered 2018-08-27: 25 mg via INTRAVENOUS

## 2018-08-27 MED ORDER — DIPHENHYDRAMINE HCL 50 MG/ML IJ SOLN
INTRAMUSCULAR | Status: AC
  Filled 2018-08-27: qty 1

## 2018-08-27 MED ORDER — DEXAMETHASONE SODIUM PHOSPHATE 10 MG/ML IJ SOLN
INTRAMUSCULAR | Status: AC
  Filled 2018-08-27: qty 1

## 2018-08-27 MED ORDER — HEPARIN SOD (PORK) LOCK FLUSH 100 UNIT/ML IV SOLN
500.0000 [IU] | Freq: Once | INTRAVENOUS | Status: AC | PRN
  Administered 2018-08-27: 500 [IU]
  Filled 2018-08-27: qty 5

## 2018-08-27 MED ORDER — FAMOTIDINE IN NACL 20-0.9 MG/50ML-% IV SOLN
20.0000 mg | Freq: Once | INTRAVENOUS | Status: AC
  Administered 2018-08-27: 20 mg via INTRAVENOUS

## 2018-08-27 MED ORDER — SODIUM CHLORIDE 0.9 % IV SOLN
80.0000 mg/m2 | Freq: Once | INTRAVENOUS | Status: AC
  Administered 2018-08-27: 150 mg via INTRAVENOUS
  Filled 2018-08-27: qty 25

## 2018-08-27 MED ORDER — SODIUM CHLORIDE 0.9 % IV SOLN
Freq: Once | INTRAVENOUS | Status: AC
  Administered 2018-08-27: 09:00:00 via INTRAVENOUS
  Filled 2018-08-27: qty 250

## 2018-08-27 MED ORDER — SODIUM CHLORIDE 0.9% FLUSH
10.0000 mL | INTRAVENOUS | Status: DC | PRN
  Administered 2018-08-27: 10 mL
  Filled 2018-08-27: qty 10

## 2018-08-27 MED ORDER — FAMOTIDINE IN NACL 20-0.9 MG/50ML-% IV SOLN
INTRAVENOUS | Status: AC
  Filled 2018-08-27: qty 50

## 2018-08-27 MED ORDER — DEXAMETHASONE SODIUM PHOSPHATE 10 MG/ML IJ SOLN
10.0000 mg | Freq: Once | INTRAMUSCULAR | Status: AC
  Administered 2018-08-27: 10 mg via INTRAVENOUS

## 2018-08-27 NOTE — Patient Instructions (Signed)
[facility] Discharge Instructions for Patients Receiving Chemotherapy  Today you received the following chemotherapy agents taxol  To help prevent nausea and vomiting after your treatment, we encourage you to take your nausea medication as directed   If you develop nausea and vomiting that is not controlled by your nausea medication, call the clinic.   BELOW ARE SYMPTOMS THAT SHOULD BE REPORTED IMMEDIATELY:  *FEVER GREATER THAN 100.5 F  *CHILLS WITH OR WITHOUT FEVER  NAUSEA AND VOMITING THAT IS NOT CONTROLLED WITH YOUR NAUSEA MEDICATION  *UNUSUAL SHORTNESS OF BREATH  *UNUSUAL BRUISING OR BLEEDING  TENDERNESS IN MOUTH AND THROAT WITH OR WITHOUT PRESENCE OF ULCERS  *URINARY PROBLEMS  *BOWEL PROBLEMS  UNUSUAL RASH Items with * indicate a potential emergency and should be followed up as soon as possible.  Feel free to call the clinic you have any questions or concerns. The clinic phone number is [phone].  

## 2018-08-29 ENCOUNTER — Other Ambulatory Visit: Payer: Self-pay | Admitting: Oncology

## 2018-08-31 ENCOUNTER — Other Ambulatory Visit: Payer: Self-pay | Admitting: Oncology

## 2018-09-03 ENCOUNTER — Other Ambulatory Visit: Payer: Self-pay | Admitting: Nurse Practitioner

## 2018-09-03 ENCOUNTER — Inpatient Hospital Stay: Payer: Medicaid Other

## 2018-09-03 VITALS — BP 148/91 | HR 94 | Temp 98.1°F | Resp 16

## 2018-09-03 DIAGNOSIS — J029 Acute pharyngitis, unspecified: Secondary | ICD-10-CM

## 2018-09-03 DIAGNOSIS — Z5111 Encounter for antineoplastic chemotherapy: Secondary | ICD-10-CM | POA: Diagnosis not present

## 2018-09-03 DIAGNOSIS — Z95828 Presence of other vascular implants and grafts: Secondary | ICD-10-CM

## 2018-09-03 DIAGNOSIS — C160 Malignant neoplasm of cardia: Secondary | ICD-10-CM

## 2018-09-03 LAB — CBC WITH DIFFERENTIAL (CANCER CENTER ONLY)
Abs Immature Granulocytes: 0.02 10*3/uL (ref 0.00–0.07)
BASOS ABS: 0 10*3/uL (ref 0.0–0.1)
Basophils Relative: 1 %
EOS PCT: 5 %
Eosinophils Absolute: 0.2 10*3/uL (ref 0.0–0.5)
HCT: 36.2 % — ABNORMAL LOW (ref 39.0–52.0)
Hemoglobin: 12 g/dL — ABNORMAL LOW (ref 13.0–17.0)
Immature Granulocytes: 1 %
Lymphocytes Relative: 42 %
Lymphs Abs: 1.8 10*3/uL (ref 0.7–4.0)
MCH: 29.3 pg (ref 26.0–34.0)
MCHC: 33.1 g/dL (ref 30.0–36.0)
MCV: 88.3 fL (ref 80.0–100.0)
Monocytes Absolute: 0.2 10*3/uL (ref 0.1–1.0)
Monocytes Relative: 5 %
NEUTROS PCT: 46 %
Neutro Abs: 2 10*3/uL (ref 1.7–7.7)
Platelet Count: 203 10*3/uL (ref 150–400)
RBC: 4.1 MIL/uL — ABNORMAL LOW (ref 4.22–5.81)
RDW: 16.7 % — ABNORMAL HIGH (ref 11.5–15.5)
WBC Count: 4.3 10*3/uL (ref 4.0–10.5)
nRBC: 0 % (ref 0.0–0.2)

## 2018-09-03 LAB — CMP (CANCER CENTER ONLY)
ALT: 17 U/L (ref 0–44)
AST: 16 U/L (ref 15–41)
Albumin: 3.9 g/dL (ref 3.5–5.0)
Alkaline Phosphatase: 66 U/L (ref 38–126)
Anion gap: 12 (ref 5–15)
BILIRUBIN TOTAL: 0.5 mg/dL (ref 0.3–1.2)
BUN: 10 mg/dL (ref 6–20)
CHLORIDE: 109 mmol/L (ref 98–111)
CO2: 23 mmol/L (ref 22–32)
Calcium: 9.9 mg/dL (ref 8.9–10.3)
Creatinine: 0.81 mg/dL (ref 0.61–1.24)
GFR, Est AFR Am: >60 mL/min (ref >60)
GFR, Estimated: >60 mL/min (ref >60)
Glucose, Bld: 68 mg/dL — ABNORMAL LOW (ref 70–99)
Potassium: 3.7 mmol/L (ref 3.5–5.1)
Sodium: 144 mmol/L (ref 135–145)
Total Protein: 7.1 g/dL (ref 6.5–8.1)

## 2018-09-03 MED ORDER — ACETAMINOPHEN 325 MG PO TABS
ORAL_TABLET | ORAL | Status: AC
  Filled 2018-09-03: qty 2

## 2018-09-03 MED ORDER — SODIUM CHLORIDE 0.9% FLUSH
10.0000 mL | INTRAVENOUS | Status: DC | PRN
  Administered 2018-09-03: 10 mL via INTRAVENOUS
  Filled 2018-09-03: qty 10

## 2018-09-03 MED ORDER — DIPHENHYDRAMINE HCL 50 MG/ML IJ SOLN
25.0000 mg | Freq: Once | INTRAMUSCULAR | Status: AC
  Administered 2018-09-03: 25 mg via INTRAVENOUS

## 2018-09-03 MED ORDER — HEPARIN SOD (PORK) LOCK FLUSH 100 UNIT/ML IV SOLN
500.0000 [IU] | Freq: Once | INTRAVENOUS | Status: AC | PRN
  Administered 2018-09-03: 500 [IU]
  Filled 2018-09-03: qty 5

## 2018-09-03 MED ORDER — DEXAMETHASONE SODIUM PHOSPHATE 10 MG/ML IJ SOLN
10.0000 mg | Freq: Once | INTRAMUSCULAR | Status: AC
  Administered 2018-09-03: 10 mg via INTRAVENOUS

## 2018-09-03 MED ORDER — DEXAMETHASONE SODIUM PHOSPHATE 10 MG/ML IJ SOLN
INTRAMUSCULAR | Status: AC
  Filled 2018-09-03: qty 1

## 2018-09-03 MED ORDER — ACETAMINOPHEN 325 MG PO TABS
650.0000 mg | ORAL_TABLET | Freq: Once | ORAL | Status: AC
  Administered 2018-09-03: 650 mg via ORAL

## 2018-09-03 MED ORDER — FLUCONAZOLE 100 MG PO TABS: 100.0000 mg | ORAL_TABLET | Freq: Every day | ORAL | 0 refills | Status: DC

## 2018-09-03 MED ORDER — SODIUM CHLORIDE 0.9 % IV SOLN
Freq: Once | INTRAVENOUS | Status: AC
  Administered 2018-09-03: 09:00:00 via INTRAVENOUS
  Filled 2018-09-03: qty 250

## 2018-09-03 MED ORDER — SODIUM CHLORIDE 0.9% FLUSH
10.0000 mL | INTRAVENOUS | Status: DC | PRN
  Administered 2018-09-03: 10 mL
  Filled 2018-09-03: qty 10

## 2018-09-03 MED ORDER — SODIUM CHLORIDE 0.9 % IV SOLN
8.0000 mg/kg | Freq: Once | INTRAVENOUS | Status: AC
  Administered 2018-09-03: 600 mg via INTRAVENOUS
  Filled 2018-09-03: qty 10

## 2018-09-03 MED ORDER — MAGIC MOUTHWASH: 5.0000 mL | Freq: Four times a day (QID) | ORAL | 0 refills | Status: DC | PRN

## 2018-09-03 MED ORDER — FAMOTIDINE IN NACL 20-0.9 MG/50ML-% IV SOLN
20.0000 mg | Freq: Once | INTRAVENOUS | Status: AC
  Administered 2018-09-03: 20 mg via INTRAVENOUS

## 2018-09-03 MED ORDER — FAMOTIDINE IN NACL 20-0.9 MG/50ML-% IV SOLN
INTRAVENOUS | Status: AC
  Filled 2018-09-03: qty 50

## 2018-09-03 MED ORDER — DIPHENHYDRAMINE HCL 50 MG/ML IJ SOLN
INTRAMUSCULAR | Status: AC
  Filled 2018-09-03: qty 1

## 2018-09-03 MED ORDER — SODIUM CHLORIDE 0.9 % IV SOLN
80.0000 mg/m2 | Freq: Once | INTRAVENOUS | Status: AC
  Administered 2018-09-03: 150 mg via INTRAVENOUS
  Filled 2018-09-03: qty 25

## 2018-09-03 NOTE — Patient Instructions (Signed)
[place] Discharge Instructions for Patients Receiving Chemotherapy  Today you received the following chemotherapy agents ramucirumab (Cyramza) and paclitaxel (Taxol)   To help prevent nausea and vomiting after your treatment, we encourage you to take your nausea medication as directed.    If you develop nausea and vomiting that is not controlled by your nausea medication, call the clinic.   BELOW ARE SYMPTOMS THAT SHOULD BE REPORTED IMMEDIATELY:  *FEVER GREATER THAN 100.5 F  *CHILLS WITH OR WITHOUT FEVER  NAUSEA AND VOMITING THAT IS NOT CONTROLLED WITH YOUR NAUSEA MEDICATION  *UNUSUAL SHORTNESS OF BREATH  *UNUSUAL BRUISING OR BLEEDING  TENDERNESS IN MOUTH AND THROAT WITH OR WITHOUT PRESENCE OF ULCERS  *URINARY PROBLEMS  *BOWEL PROBLEMS  UNUSUAL RASH Items with * indicate a potential emergency and should be followed up as soon as possible.  Feel free to call the clinic should you have any questions or concerns. The clinic phone number is [phone].  Please show the [place] at check-in to the Emergency Department and triage nurse.

## 2018-09-03 NOTE — Progress Notes (Deleted)
  [place] OFFICE PROGRESS NOTE   Diagnosis:    INTERVAL HISTORY:   ***  Objective:  Vital signs in last 24 hours:  There were no vitals taken for this visit.    HEENT: *** Lymphatics: *** Resp: *** Cardio: *** GI: *** Vascular: *** Neuro: ***  Skin: ***    Lab Results:  Lab Results  Component Value Date   WBC 4.3 09/03/2018   HGB 12.0 (L) 09/03/2018   HCT 36.2 (L) 09/03/2018   MCV 88.3 09/03/2018   PLT 203 09/03/2018   NEUTROABS 2.0 09/03/2018    Imaging:  No results found.  Medications: I have reviewed the patient's current medications.  Assessment/Plan:   Disposition:    Ned Card ANP/GNP-BC   09/03/2018  9:21 AM

## 2018-09-03 NOTE — Progress Notes (Signed)
Pt reports mouth sore on side of tongue which started the day before yesterday.  Able to swallow water without pain, but was "hard to eat a biscuit this morning" Using Biotine mouthwash.  Spoke with Dr. Benay Spice, ok to tx today. He will be over to assess pt while in infusion.

## 2018-09-03 NOTE — Patient Instructions (Signed)
Implanted Port Home Guide An implanted port is a type of central line that is placed under the skin. Central lines are used to provide IV access when treatment or nutrition needs to be given through a person's veins. Implanted ports are used for long-term IV access. An implanted port may be placed because:  You need IV medicine that would be irritating to the small veins in your hands or arms.  You need long-term IV medicines, such as antibiotics.  You need IV nutrition for a long period.  You need frequent blood draws for lab tests.  You need dialysis.  Implanted ports are usually placed in the chest area, but they can also be placed in the upper arm, the abdomen, or the leg. An implanted port has two main parts:  Reservoir. The reservoir is round and will appear as a small, raised area under your skin. The reservoir is the part where a needle is inserted to give medicines or draw blood.  Catheter. The catheter is a thin, flexible tube that extends from the reservoir. The catheter is placed into a large vein. Medicine that is inserted into the reservoir goes into the catheter and then into the vein.  How will I care for my incision site? Do not get the incision site wet. Bathe or shower as directed by your health care provider. How is my port accessed? Special steps must be taken to access the port:  Before the port is accessed, a numbing cream can be placed on the skin. This helps numb the skin over the port site.  Your health care provider uses a sterile technique to access the port. ? Your health care provider must put on a mask and sterile gloves. ? The skin over your port is cleaned carefully with an antiseptic and allowed to dry. ? The port is gently pinched between sterile gloves, and a needle is inserted into the port.  Only "non-coring" port needles should be used to access the port. Once the port is accessed, a blood return should be checked. This helps ensure that the port  is in the vein and is not clogged.  If your port needs to remain accessed for a constant infusion, a clear (transparent) bandage will be placed over the needle site. The bandage and needle will need to be changed every week, or as directed by your health care provider.  Keep the bandage covering the needle clean and dry. Do not get it wet. Follow your health care provider's instructions on how to take a shower or bath while the port is accessed.  If your port does not need to stay accessed, no bandage is needed over the port.  What is flushing? Flushing helps keep the port from getting clogged. Follow your health care provider's instructions on how and when to flush the port. Ports are usually flushed with saline solution or a medicine called heparin. The need for flushing will depend on how the port is used.  If the port is used for intermittent medicines or blood draws, the port will need to be flushed: ? After medicines have been given. ? After blood has been drawn. ? As part of routine maintenance.  If a constant infusion is running, the port may not need to be flushed.  How long will my port stay implanted? The port can stay in for as long as your health care provider thinks it is needed. When it is time for the port to come out, surgery will be   done to remove it. The procedure is similar to the one performed when the port was put in. When should I seek immediate medical care? When you have an implanted port, you should seek immediate medical care if:  You notice a bad smell coming from the incision site.  You have swelling, redness, or drainage at the incision site.  You have more swelling or pain at the port site or the surrounding area.  You have a fever that is not controlled with medicine.  This information is not intended to replace advice given to you by your health care provider. Make sure you discuss any questions you have with your health care provider. Document  Released: 09/10/2005 Document Revised: 02/16/2016 Document Reviewed: 05/18/2013 Elsevier Interactive Patient Education  2017 Elsevier Inc.  

## 2018-09-09 ENCOUNTER — Telehealth: Payer: Self-pay | Admitting: *Deleted

## 2018-09-09 NOTE — Telephone Encounter (Signed)
Patient has developed painful sores on his tongue and tongue is coated white. Does not see any sores in oral mucosa, however patient reports his throat hurts and it is difficult to swallow. Using MMW, Biotene, salt water gargles/rinses without any relief. Completed the 4 day course of fluconazole that was ordered on 09/03/18, day of last treatment. No fever or obvious s/s of infection. Does not want to come to office. Asking about anything medically to help. Asking if taking Lysine would be of benefit?

## 2018-09-09 NOTE — Telephone Encounter (Signed)
Spoke with patient. Per dr Benay Spice, patient is to use salt water rinse, mmw and oragel for ulcers on tongue.. may cancel next chemo treatment.  office visit already scheduled on day of next chemo to assess patient.

## 2018-09-14 ENCOUNTER — Other Ambulatory Visit: Payer: Self-pay | Admitting: Oncology

## 2018-09-15 ENCOUNTER — Inpatient Hospital Stay: Payer: Medicaid Other

## 2018-09-15 ENCOUNTER — Telehealth: Payer: Self-pay | Admitting: Oncology

## 2018-09-15 ENCOUNTER — Inpatient Hospital Stay (HOSPITAL_BASED_OUTPATIENT_CLINIC_OR_DEPARTMENT_OTHER): Payer: Medicaid Other | Admitting: Oncology

## 2018-09-15 VITALS — BP 130/90 | HR 89 | Temp 98.4°F | Resp 17 | Ht 66.0 in | Wt 163.4 lb

## 2018-09-15 DIAGNOSIS — K140 Glossitis: Secondary | ICD-10-CM

## 2018-09-15 DIAGNOSIS — C160 Malignant neoplasm of cardia: Secondary | ICD-10-CM | POA: Diagnosis not present

## 2018-09-15 DIAGNOSIS — E119 Type 2 diabetes mellitus without complications: Secondary | ICD-10-CM

## 2018-09-15 DIAGNOSIS — R5381 Other malaise: Secondary | ICD-10-CM

## 2018-09-15 DIAGNOSIS — Z5111 Encounter for antineoplastic chemotherapy: Secondary | ICD-10-CM | POA: Diagnosis not present

## 2018-09-15 DIAGNOSIS — H5461 Unqualified visual loss, right eye, normal vision left eye: Secondary | ICD-10-CM

## 2018-09-15 DIAGNOSIS — C770 Secondary and unspecified malignant neoplasm of lymph nodes of head, face and neck: Secondary | ICD-10-CM

## 2018-09-15 DIAGNOSIS — I10 Essential (primary) hypertension: Secondary | ICD-10-CM

## 2018-09-15 DIAGNOSIS — Z95828 Presence of other vascular implants and grafts: Secondary | ICD-10-CM

## 2018-09-15 LAB — CBC WITH DIFFERENTIAL (CANCER CENTER ONLY)
Abs Immature Granulocytes: 0.01 10*3/uL (ref 0.00–0.07)
BASOS ABS: 0 10*3/uL (ref 0.0–0.1)
Basophils Relative: 1 %
Eosinophils Absolute: 0.1 10*3/uL (ref 0.0–0.5)
Eosinophils Relative: 2 %
HCT: 36.4 % — ABNORMAL LOW (ref 39.0–52.0)
Hemoglobin: 11.9 g/dL — ABNORMAL LOW (ref 13.0–17.0)
Immature Granulocytes: 0 %
Lymphocytes Relative: 29 %
Lymphs Abs: 1.1 10*3/uL (ref 0.7–4.0)
MCH: 29.1 pg (ref 26.0–34.0)
MCHC: 32.7 g/dL (ref 30.0–36.0)
MCV: 89 fL (ref 80.0–100.0)
Monocytes Absolute: 0.5 10*3/uL (ref 0.1–1.0)
Monocytes Relative: 13 %
NRBC: 0 % (ref 0.0–0.2)
Neutro Abs: 2.2 10*3/uL (ref 1.7–7.7)
Neutrophils Relative %: 55 %
PLATELETS: 229 10*3/uL (ref 150–400)
RBC: 4.09 MIL/uL — ABNORMAL LOW (ref 4.22–5.81)
RDW: 17.1 % — ABNORMAL HIGH (ref 11.5–15.5)
WBC: 4 10*3/uL (ref 4.0–10.5)

## 2018-09-15 LAB — CMP (CANCER CENTER ONLY)
ALT: 15 U/L (ref 0–44)
ANION GAP: 9 (ref 5–15)
AST: 15 U/L (ref 15–41)
Albumin: 3.8 g/dL (ref 3.5–5.0)
Alkaline Phosphatase: 64 U/L (ref 38–126)
BUN: 8 mg/dL (ref 6–20)
CO2: 25 mmol/L (ref 22–32)
Calcium: 9.4 mg/dL (ref 8.9–10.3)
Chloride: 109 mmol/L (ref 98–111)
Creatinine: 0.86 mg/dL (ref 0.61–1.24)
GFR, Est AFR Am: >60 mL/min (ref >60)
GFR, Estimated: >60 mL/min (ref >60)
Glucose, Bld: 133 mg/dL — ABNORMAL HIGH (ref 70–99)
Potassium: 3.8 mmol/L (ref 3.5–5.1)
Sodium: 143 mmol/L (ref 135–145)
Total Bilirubin: 0.4 mg/dL (ref 0.3–1.2)
Total Protein: 6.8 g/dL (ref 6.5–8.1)

## 2018-09-15 LAB — TOTAL PROTEIN, URINE DIPSTICK: Protein, ur: NEGATIVE mg/dL

## 2018-09-15 MED ORDER — DIPHENHYDRAMINE HCL 50 MG/ML IJ SOLN
INTRAMUSCULAR | Status: AC
  Filled 2018-09-15: qty 1

## 2018-09-15 MED ORDER — ACETAMINOPHEN 325 MG PO TABS
ORAL_TABLET | ORAL | Status: AC
  Filled 2018-09-15: qty 2

## 2018-09-15 MED ORDER — DEXAMETHASONE SODIUM PHOSPHATE 10 MG/ML IJ SOLN
INTRAMUSCULAR | Status: AC
  Filled 2018-09-15: qty 1

## 2018-09-15 MED ORDER — DEXAMETHASONE SODIUM PHOSPHATE 10 MG/ML IJ SOLN
10.0000 mg | Freq: Once | INTRAMUSCULAR | Status: AC
  Administered 2018-09-15: 10 mg via INTRAVENOUS

## 2018-09-15 MED ORDER — DIPHENHYDRAMINE HCL 50 MG/ML IJ SOLN
25.0000 mg | Freq: Once | INTRAMUSCULAR | Status: AC
  Administered 2018-09-15: 25 mg via INTRAVENOUS

## 2018-09-15 MED ORDER — FAMOTIDINE IN NACL 20-0.9 MG/50ML-% IV SOLN
INTRAVENOUS | Status: AC
  Filled 2018-09-15: qty 50

## 2018-09-15 MED ORDER — SODIUM CHLORIDE 0.9% FLUSH
10.0000 mL | INTRAVENOUS | Status: DC | PRN
  Administered 2018-09-15: 10 mL
  Filled 2018-09-15: qty 10

## 2018-09-15 MED ORDER — ACETAMINOPHEN 325 MG PO TABS
650.0000 mg | ORAL_TABLET | Freq: Once | ORAL | Status: AC
  Administered 2018-09-15: 650 mg via ORAL

## 2018-09-15 MED ORDER — SODIUM CHLORIDE 0.9 % IV SOLN
Freq: Once | INTRAVENOUS | Status: AC
  Administered 2018-09-15: 11:00:00 via INTRAVENOUS
  Filled 2018-09-15: qty 250

## 2018-09-15 MED ORDER — SODIUM CHLORIDE 0.9 % IV SOLN
80.0000 mg/m2 | Freq: Once | INTRAVENOUS | Status: AC
  Administered 2018-09-15: 150 mg via INTRAVENOUS
  Filled 2018-09-15: qty 25

## 2018-09-15 MED ORDER — SODIUM CHLORIDE 0.9% FLUSH
10.0000 mL | INTRAVENOUS | Status: DC | PRN
  Administered 2018-09-15: 10 mL via INTRAVENOUS
  Filled 2018-09-15: qty 10

## 2018-09-15 MED ORDER — FAMOTIDINE IN NACL 20-0.9 MG/50ML-% IV SOLN
20.0000 mg | Freq: Once | INTRAVENOUS | Status: AC
  Administered 2018-09-15: 20 mg via INTRAVENOUS

## 2018-09-15 MED ORDER — HEPARIN SOD (PORK) LOCK FLUSH 100 UNIT/ML IV SOLN
500.0000 [IU] | Freq: Once | INTRAVENOUS | Status: AC | PRN
  Administered 2018-09-15: 500 [IU]
  Filled 2018-09-15: qty 5

## 2018-09-15 MED ORDER — SODIUM CHLORIDE 0.9 % IV SOLN
8.0000 mg/kg | Freq: Once | INTRAVENOUS | Status: AC
  Administered 2018-09-15: 600 mg via INTRAVENOUS
  Filled 2018-09-15: qty 50

## 2018-09-15 NOTE — Telephone Encounter (Signed)
Printed calendar and avs. °

## 2018-09-15 NOTE — Progress Notes (Signed)
[place] OFFICE PROGRESS NOTE   Diagnosis: Gastric cancer  INTERVAL HISTORY:   RobertYost turns as scheduled.  He began another cycle of Taxol/ramucirumab on 08/19/2018.  He developed an ulcer at the left side of the tongue approximately 2 weeks ago.  The ulcer was painful and limited his ability to eat.  He used mouthwash and Orajel.  The ulcer has improved.  He has mild discomfort in the left side of the neck.  He wonders whether this is related to the tongue ulcer.  He has malaise.  He has noted an increase in peripheral numbness.  Objective:  Vital signs in last 24 hours:  Blood pressure 130/90, pulse 89, temperature 98.4 F (36.9 C), temperature source Oral, resp. rate 17, height _0  (1.676 m), weight 163 lb 6.4 oz (74.1 kg), SpO2 98 %.    HEENT: Ulcer at the left side of the tongue has almost completely healed.  No other ulcers.  No thrush. Lymphatics: No cervical or supraclavicular nodes Resp: Lungs clear bilaterally Cardio: Regular rate and rhythm GI: No hepatomegaly, nontender Vascular: No leg edema Neuro: Mild loss of vibratory sense at the fingertips bilaterally  Portacath/PICC-without erythema  Lab Results:  Lab Results  Component Value Date   WBC 4.0 09/15/2018   HGB 11.9 (L) 09/15/2018   HCT 36.4 (L) 09/15/2018   MCV 89.0 09/15/2018   PLT 229 09/15/2018   NEUTROABS 2.2 09/15/2018    CMP  Lab Results  Component Value Date   NA 143 09/15/2018   K 3.8 09/15/2018   CL 109 09/15/2018   CO2 25 09/15/2018   GLUCOSE 133 (H) 09/15/2018   BUN 8 09/15/2018   CREATININE 0.86 09/15/2018   CALCIUM 9.4 09/15/2018   PROT 6.8 09/15/2018   ALBUMIN 3.8 09/15/2018   AST 15 09/15/2018   ALT 15 09/15/2018   ALKPHOS 64 09/15/2018   BILITOT 0.4 09/15/2018   GFRNONAA >60 09/15/2018   GFRAA >60 09/15/2018    Lab Results  Component Value Date   CEA1 3.83 06/27/2016     Medications: I have reviewed the patient's current  medications.   Assessment/Plan:  1. 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 Decreased gastric cardia wall thickening.  Cycle 7 FOLFOX 07/25/2016  Cycle 8 FOLFOX 08/08/2016  Cycle 9 FOLFOX 08/22/2016 (oxaliplatin held due to neuropathy)  Cycle 10 FOLFOX 09/05/2016 (oxaliplatin held)  Cycle 11 FOLFOX 09/26/2016 (oxaliplatin held)  Cycle 12 FOLFOX 10/10/2016 (oxaliplatin held)  CT 10/18/2016-mild enlargement of mediastinal lymph nodes, no other evidence of disease progression, stable thickening at the GE junction and stable gastrohepatic lymphadenopathy  Cycle 13 FOLFOX 10/24/2016 (oxaliplatin held)  Maintenance Xeloda 7 days on/7 days off beginning 11/17/2016  Restaging chest CT 01/22/2017-interval progression of metastatic lymphadenopathy in the mediastinum and gastrohepatic ligament.  Salvage therapy with FOLFIRI initiated 02/20/2017  Cycle 5 FOLFIRI 04/17/2017  Restaging chest CT 04/29/2017-mild improvement in mediastinal adenopathy. No significant change in adenopathy in the gastrohepatic  ligament. No evidence of disease progression.  Cycle 6 FOLFIRI 05/01/2017  Cycle 7 FOLFIRI 05/15/2017  Cycle 8 FOLFIRI 05/29/2017  Cycle 9 FOLFIRI 06/19/2017  Cycle 10 FOLFIRI  07/03/2017  Cycle 11 FOLFIRI 07/17/2017  CT chest 07/30/2017-mild decrease in mediastinal and gastrohepatic ligament lymphadenopathy, no evidence of progressive disease  Cycle 12 FOLFIRI 07/31/2017  Cycle 13 FOLFIRI 08/14/2017  Cycle 14 FOLFIRI 09/04/2017  Cycle 15 FOLFIRI 09/25/2017  Cycle 16 FOLFIRI 10/16/2017  Cycle 17 FOLFIRI 11/06/2017  Cycle 18 FOLFIRI 11/27/2017  cycle 19 FOLFIRI 12/18/2017  CT 01/06/2018-stable neck/mediastinal nodes, slight enlargement of gastrohepatic ligament node stable periportal and portacaval nodes  Cycle 20 FOLFIRI 01/08/2018  Cycle 21 FOLFIRI 01/29/2018  CT neck 02/06/2018- 4 cm necrotic right levelIII/IVlymph node similar to the chest CT 01/06/2018, considerably larger than on 07/30/2017. Unchanged adjacent right level IV/supraclavicular lymph node and partially visualized mediastinal lymph nodes.  Cycle 1 Taxol/ramucirumab 03/05/2018  Core biopsy right cervical lymph node 02/28/2018-metastatic adenocarcinoma, HER-2 negative PDL 1 score-60, MSI-stable, tumor mutational burden-10  Cycle 2 Taxol/ramucirumab7/06/2018  Cycle 3 Taxol/ramucirumab 04/30/2018  CTs 05/23/2018- decrease in mediastinal and gastrohepatic ligament adenopathy, decreased right sided level 3/4 node  Cycle 4 Taxol/ramucirumab 05/28/2018  Cycle 5 Taxol/ramucirumab 06/24/2018 (day 1 ramucirumab held secondary to right eye vision loss)  Cycle 6 Taxol/ramucirumab 07/23/2018  Cycle 7 Taxol/ramucirumab 08/19/2018  [place] Taxol/ramucirumab 09/15/2018  2. Anemia secondary to #1-improved  3. Diabetes  4. Gout  5. Hypertension  6. Hyperlipidemia  7. History of a T7 compression fracture  8. Pain secondary to the gastric mass and mediastinal lymphadenopathy-resolved  9. Port-A-Cath placement 04/20/2016  10. 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 1 mm  obstructing stone distal right ureter/right ureteral vesicle junction on CT 09/13/2016.  13. 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 Hypercalcemia 12/19/2016-asymptomatic  15. Nausea following FOLFIRI-emend added beginning with cycle 3, trial of Reglan beginning12/08/2017, persistent  16.Erythematous, mildly indurated rash right upper posterior leg in setting of possible recent tick bitestatus post course of doxycycline.Resolved.  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  Left tongue ulcer during cycle 7 Taxol/ramucirumab    Disposition: Robert Beck appears stable.  He will complete another cycle of Taxol/ramucirumab beginning today.  The tongue ulcer may be related to the Taxol, ramucirumab, or an "aphthous "ulcer.  He will contact us for a recurrent ulcer during this cycle of chemotherapy.  He will undergo a restaging CT evaluation after this cycle of chemotherapy.  He will return for an office visit 10/15/2018.   Betsy Coder, MD  09/15/2018  10:07 AM

## 2018-09-15 NOTE — Patient Instructions (Signed)
[facility] Discharge Instructions for Patients Receiving Chemotherapy  Today you received the following chemotherapy agents Cyramza and Taxol  To help prevent nausea and vomiting after your treatment, we encourage you to take your nausea medication as directed.    If you develop nausea and vomiting that is not controlled by your nausea medication, call the clinic.   BELOW ARE SYMPTOMS THAT SHOULD BE REPORTED IMMEDIATELY:  *FEVER GREATER THAN 100.5 F  *CHILLS WITH OR WITHOUT FEVER  NAUSEA AND VOMITING THAT IS NOT CONTROLLED WITH YOUR NAUSEA MEDICATION  *UNUSUAL SHORTNESS OF BREATH  *UNUSUAL BRUISING OR BLEEDING  TENDERNESS IN MOUTH AND THROAT WITH OR WITHOUT PRESENCE OF ULCERS  *URINARY PROBLEMS  *BOWEL PROBLEMS  UNUSUAL RASH Items with * indicate a potential emergency and should be followed up as soon as possible.  Feel free to call the clinic should you have any questions or concerns. The clinic phone number is [phone].  Please show the CHEMO ALERT CARD at check-in to the Emergency Department and triage nurse.   

## 2018-09-16 ENCOUNTER — Other Ambulatory Visit: Payer: Self-pay | Admitting: *Deleted

## 2018-09-16 DIAGNOSIS — C160 Malignant neoplasm of cardia: Secondary | ICD-10-CM

## 2018-09-16 MED ORDER — MAGIC MOUTHWASH: 5.0000 mL | Freq: Four times a day (QID) | ORAL | 0 refills | Status: DC | PRN

## 2018-09-16 NOTE — Progress Notes (Signed)
Significant other, [name] called to ask for refill on MMW. Script called to pharmacy.

## 2018-09-21 ENCOUNTER — Other Ambulatory Visit: Payer: Self-pay | Admitting: Oncology

## 2018-09-23 ENCOUNTER — Inpatient Hospital Stay: Payer: Medicaid Other

## 2018-09-23 VITALS — BP 146/97 | HR 78 | Temp 98.2°F | Resp 20

## 2018-09-23 DIAGNOSIS — Z5111 Encounter for antineoplastic chemotherapy: Secondary | ICD-10-CM | POA: Diagnosis not present

## 2018-09-23 DIAGNOSIS — C160 Malignant neoplasm of cardia: Secondary | ICD-10-CM

## 2018-09-23 DIAGNOSIS — Z95828 Presence of other vascular implants and grafts: Secondary | ICD-10-CM

## 2018-09-23 LAB — CBC WITH DIFFERENTIAL (CANCER CENTER ONLY)
Abs Immature Granulocytes: 0.09 10*3/uL — ABNORMAL HIGH (ref 0.00–0.07)
Basophils Absolute: 0.1 10*3/uL (ref 0.0–0.1)
Basophils Relative: 1 %
Eosinophils Absolute: 0.1 10*3/uL (ref 0.0–0.5)
Eosinophils Relative: 2 %
HCT: 36.3 % — ABNORMAL LOW (ref 39.0–52.0)
Hemoglobin: 11.6 g/dL — ABNORMAL LOW (ref 13.0–17.0)
IMMATURE GRANULOCYTES: 2 %
Lymphocytes Relative: 30 %
Lymphs Abs: 1.7 10*3/uL (ref 0.7–4.0)
MCH: 28.5 pg (ref 26.0–34.0)
MCHC: 32 g/dL (ref 30.0–36.0)
MCV: 89.2 fL (ref 80.0–100.0)
Monocytes Absolute: 0.3 10*3/uL (ref 0.1–1.0)
Monocytes Relative: 5 %
NEUTROS PCT: 60 %
NRBC: 0.5 % — AB (ref 0.0–0.2)
Neutro Abs: 3.5 10*3/uL (ref 1.7–7.7)
PLATELETS: 252 10*3/uL (ref 150–400)
RBC: 4.07 MIL/uL — ABNORMAL LOW (ref 4.22–5.81)
RDW: 16.6 % — ABNORMAL HIGH (ref 11.5–15.5)
WBC: 5.7 10*3/uL (ref 4.0–10.5)

## 2018-09-23 LAB — CMP (CANCER CENTER ONLY)
ALT: 19 U/L (ref 0–44)
ANION GAP: 10 (ref 5–15)
AST: 20 U/L (ref 15–41)
Albumin: 3.8 g/dL (ref 3.5–5.0)
Alkaline Phosphatase: 68 U/L (ref 38–126)
BUN: 11 mg/dL (ref 6–20)
CO2: 25 mmol/L (ref 22–32)
Calcium: 9.7 mg/dL (ref 8.9–10.3)
Chloride: 111 mmol/L (ref 98–111)
Creatinine: 0.81 mg/dL (ref 0.61–1.24)
GFR, Est AFR Am: >60 mL/min (ref >60)
Glucose, Bld: 79 mg/dL (ref 70–99)
Potassium: 3.9 mmol/L (ref 3.5–5.1)
Sodium: 146 mmol/L — ABNORMAL HIGH (ref 135–145)
Total Bilirubin: 0.5 mg/dL (ref 0.3–1.2)
Total Protein: 6.9 g/dL (ref 6.5–8.1)

## 2018-09-23 MED ORDER — FAMOTIDINE IN NACL 20-0.9 MG/50ML-% IV SOLN
INTRAVENOUS | Status: AC
  Filled 2018-09-23: qty 50

## 2018-09-23 MED ORDER — SODIUM CHLORIDE 0.9 % IV SOLN
Freq: Once | INTRAVENOUS | Status: AC
  Administered 2018-09-23: 12:00:00 via INTRAVENOUS
  Filled 2018-09-23: qty 250

## 2018-09-23 MED ORDER — SODIUM CHLORIDE 0.9 % IV SOLN
80.0000 mg/m2 | Freq: Once | INTRAVENOUS | Status: AC
  Administered 2018-09-23: 150 mg via INTRAVENOUS
  Filled 2018-09-23: qty 25

## 2018-09-23 MED ORDER — DIPHENHYDRAMINE HCL 50 MG/ML IJ SOLN
INTRAMUSCULAR | Status: AC
  Filled 2018-09-23: qty 1

## 2018-09-23 MED ORDER — DIPHENHYDRAMINE HCL 50 MG/ML IJ SOLN
25.0000 mg | Freq: Once | INTRAMUSCULAR | Status: AC
  Administered 2018-09-23: 25 mg via INTRAVENOUS

## 2018-09-23 MED ORDER — SODIUM CHLORIDE 0.9% FLUSH
10.0000 mL | INTRAVENOUS | Status: DC | PRN
  Administered 2018-09-23: 10 mL
  Filled 2018-09-23: qty 10

## 2018-09-23 MED ORDER — HEPARIN SOD (PORK) LOCK FLUSH 100 UNIT/ML IV SOLN
500.0000 [IU] | Freq: Once | INTRAVENOUS | Status: AC | PRN
  Administered 2018-09-23: 500 [IU]
  Filled 2018-09-23: qty 5

## 2018-09-23 MED ORDER — SODIUM CHLORIDE 0.9% FLUSH
10.0000 mL | INTRAVENOUS | Status: DC | PRN
  Administered 2018-09-23: 10 mL via INTRAVENOUS
  Filled 2018-09-23: qty 10

## 2018-09-23 MED ORDER — DEXAMETHASONE SODIUM PHOSPHATE 10 MG/ML IJ SOLN
10.0000 mg | Freq: Once | INTRAMUSCULAR | Status: AC
  Administered 2018-09-23: 10 mg via INTRAVENOUS

## 2018-09-23 MED ORDER — DEXAMETHASONE SODIUM PHOSPHATE 10 MG/ML IJ SOLN
INTRAMUSCULAR | Status: AC
  Filled 2018-09-23: qty 1

## 2018-09-23 MED ORDER — FAMOTIDINE IN NACL 20-0.9 MG/50ML-% IV SOLN
20.0000 mg | Freq: Once | INTRAVENOUS | Status: AC
  Administered 2018-09-23: 20 mg via INTRAVENOUS

## 2018-09-23 NOTE — Patient Instructions (Signed)
[facility] Discharge Instructions for Patients Receiving Chemotherapy  Today you received the following chemotherapy agent: Taxol.  To help prevent nausea and vomiting after your treatment, we encourage you to take your nausea medication as directed.  If you develop nausea and vomiting that is not controlled by your nausea medication, call the clinic.   BELOW ARE SYMPTOMS THAT SHOULD BE REPORTED IMMEDIATELY:  *FEVER GREATER THAN 100.5 F  *CHILLS WITH OR WITHOUT FEVER  NAUSEA AND VOMITING THAT IS NOT CONTROLLED WITH YOUR NAUSEA MEDICATION  *UNUSUAL SHORTNESS OF BREATH  *UNUSUAL BRUISING OR BLEEDING  TENDERNESS IN MOUTH AND THROAT WITH OR WITHOUT PRESENCE OF ULCERS  *URINARY PROBLEMS  *BOWEL PROBLEMS  UNUSUAL RASH Items with * indicate a potential emergency and should be followed up as soon as possible.  Feel free to call the clinic should you have any questions or concerns. The clinic phone number is [phone].  Please show the CHEMO ALERT CARD at check-in to the Emergency Department and triage nurse.   

## 2018-09-27 ENCOUNTER — Other Ambulatory Visit: Payer: Self-pay | Admitting: Oncology

## 2018-09-30 ENCOUNTER — Other Ambulatory Visit: Payer: Self-pay | Admitting: Oncology

## 2018-10-01 ENCOUNTER — Inpatient Hospital Stay: Payer: Medicaid Other

## 2018-10-01 ENCOUNTER — Inpatient Hospital Stay: Payer: Medicaid Other | Attending: Oncology

## 2018-10-01 VITALS — BP 157/93 | HR 86 | Temp 97.5°F

## 2018-10-01 DIAGNOSIS — Z5112 Encounter for antineoplastic immunotherapy: Secondary | ICD-10-CM | POA: Insufficient documentation

## 2018-10-01 DIAGNOSIS — C160 Malignant neoplasm of cardia: Secondary | ICD-10-CM

## 2018-10-01 DIAGNOSIS — C770 Secondary and unspecified malignant neoplasm of lymph nodes of head, face and neck: Secondary | ICD-10-CM | POA: Insufficient documentation

## 2018-10-01 DIAGNOSIS — Z5111 Encounter for antineoplastic chemotherapy: Secondary | ICD-10-CM | POA: Insufficient documentation

## 2018-10-01 DIAGNOSIS — Z95828 Presence of other vascular implants and grafts: Secondary | ICD-10-CM

## 2018-10-01 LAB — CMP (CANCER CENTER ONLY)
ALT: 28 U/L (ref 0–44)
AST: 23 U/L (ref 15–41)
Albumin: 3.8 g/dL (ref 3.5–5.0)
Alkaline Phosphatase: 74 U/L (ref 38–126)
Anion gap: 9 (ref 5–15)
BUN: 11 mg/dL (ref 6–20)
CO2: 22 mmol/L (ref 22–32)
Calcium: 9.6 mg/dL (ref 8.9–10.3)
Chloride: 112 mmol/L — ABNORMAL HIGH (ref 98–111)
Creatinine: 0.8 mg/dL (ref 0.61–1.24)
GFR, Est AFR Am: >60 mL/min (ref >60)
GFR, Estimated: >60 mL/min (ref >60)
GLUCOSE: 101 mg/dL — AB (ref 70–99)
Potassium: 3.9 mmol/L (ref 3.5–5.1)
Sodium: 143 mmol/L (ref 135–145)
Total Bilirubin: 0.4 mg/dL (ref 0.3–1.2)
Total Protein: 6.9 g/dL (ref 6.5–8.1)

## 2018-10-01 LAB — CBC WITH DIFFERENTIAL (CANCER CENTER ONLY)
Abs Immature Granulocytes: 0.04 10*3/uL (ref 0.00–0.07)
Basophils Absolute: 0.1 10*3/uL (ref 0.0–0.1)
Basophils Relative: 1 %
Eosinophils Absolute: 0.2 10*3/uL (ref 0.0–0.5)
Eosinophils Relative: 5 %
HCT: 35 % — ABNORMAL LOW (ref 39.0–52.0)
HEMOGLOBIN: 11.4 g/dL — AB (ref 13.0–17.0)
Immature Granulocytes: 1 %
LYMPHS PCT: 41 %
Lymphs Abs: 1.6 10*3/uL (ref 0.7–4.0)
MCH: 29.2 pg (ref 26.0–34.0)
MCHC: 32.6 g/dL (ref 30.0–36.0)
MCV: 89.7 fL (ref 80.0–100.0)
Monocytes Absolute: 0.3 10*3/uL (ref 0.1–1.0)
Monocytes Relative: 8 %
Neutro Abs: 1.7 10*3/uL (ref 1.7–7.7)
Neutrophils Relative %: 44 %
PLATELETS: 208 10*3/uL (ref 150–400)
RBC: 3.9 MIL/uL — ABNORMAL LOW (ref 4.22–5.81)
RDW: 17.5 % — AB (ref 11.5–15.5)
WBC Count: 3.9 10*3/uL — ABNORMAL LOW (ref 4.0–10.5)
nRBC: 1.3 % — ABNORMAL HIGH (ref 0.0–0.2)

## 2018-10-01 MED ORDER — SODIUM CHLORIDE 0.9 % IV SOLN
8.0000 mg/kg | Freq: Once | INTRAVENOUS | Status: AC
  Administered 2018-10-01: 600 mg via INTRAVENOUS
  Filled 2018-10-01: qty 50

## 2018-10-01 MED ORDER — HEPARIN SOD (PORK) LOCK FLUSH 100 UNIT/ML IV SOLN
500.0000 [IU] | Freq: Once | INTRAVENOUS | Status: AC | PRN
  Administered 2018-10-01: 500 [IU]
  Filled 2018-10-01: qty 5

## 2018-10-01 MED ORDER — ACETAMINOPHEN 325 MG PO TABS
650.0000 mg | ORAL_TABLET | Freq: Once | ORAL | Status: AC
  Administered 2018-10-01: 650 mg via ORAL

## 2018-10-01 MED ORDER — DEXAMETHASONE SODIUM PHOSPHATE 10 MG/ML IJ SOLN
INTRAMUSCULAR | Status: AC
  Filled 2018-10-01: qty 1

## 2018-10-01 MED ORDER — SODIUM CHLORIDE 0.9 % IV SOLN
80.0000 mg/m2 | Freq: Once | INTRAVENOUS | Status: AC
  Administered 2018-10-01: 150 mg via INTRAVENOUS
  Filled 2018-10-01: qty 25

## 2018-10-01 MED ORDER — DEXAMETHASONE SODIUM PHOSPHATE 10 MG/ML IJ SOLN
10.0000 mg | Freq: Once | INTRAMUSCULAR | Status: AC
  Administered 2018-10-01: 10 mg via INTRAVENOUS

## 2018-10-01 MED ORDER — SODIUM CHLORIDE 0.9% FLUSH
10.0000 mL | INTRAVENOUS | Status: DC | PRN
  Administered 2018-10-01: 10 mL via INTRAVENOUS
  Filled 2018-10-01: qty 10

## 2018-10-01 MED ORDER — FAMOTIDINE IN NACL 20-0.9 MG/50ML-% IV SOLN
20.0000 mg | Freq: Once | INTRAVENOUS | Status: AC
  Administered 2018-10-01: 20 mg via INTRAVENOUS

## 2018-10-01 MED ORDER — DIPHENHYDRAMINE HCL 50 MG/ML IJ SOLN
INTRAMUSCULAR | Status: AC
  Filled 2018-10-01: qty 1

## 2018-10-01 MED ORDER — SODIUM CHLORIDE 0.9% FLUSH
10.0000 mL | INTRAVENOUS | Status: DC | PRN
  Administered 2018-10-01: 10 mL
  Filled 2018-10-01: qty 10

## 2018-10-01 MED ORDER — DIPHENHYDRAMINE HCL 50 MG/ML IJ SOLN
25.0000 mg | Freq: Once | INTRAMUSCULAR | Status: AC
  Administered 2018-10-01: 25 mg via INTRAVENOUS

## 2018-10-01 MED ORDER — ACETAMINOPHEN 325 MG PO TABS
ORAL_TABLET | ORAL | Status: AC
  Filled 2018-10-01: qty 2

## 2018-10-01 MED ORDER — FAMOTIDINE IN NACL 20-0.9 MG/50ML-% IV SOLN
INTRAVENOUS | Status: AC
  Filled 2018-10-01: qty 50

## 2018-10-01 MED ORDER — SODIUM CHLORIDE 0.9 % IV SOLN
Freq: Once | INTRAVENOUS | Status: AC
  Administered 2018-10-01: 10:00:00 via INTRAVENOUS
  Filled 2018-10-01: qty 250

## 2018-10-01 NOTE — Progress Notes (Signed)
Noted elevated BP's today. Ned Card, NP aware and advises ok to treat. Egbert Garibaldi, RN notified patient that he needs to f/u with PCP regarding elevated BP's. Patient verbalized understanding.

## 2018-10-01 NOTE — Patient Instructions (Signed)
[facility] Discharge Instructions for Patients Receiving Chemotherapy  Today you received the following chemotherapy agents: Paclitaxel (Taxol) and Ramucirumab (Cyramza)  To help prevent nausea and vomiting after your treatment, we encourage you to take your nausea medication as directed.    If you develop nausea and vomiting that is not controlled by your nausea medication, call the clinic.   BELOW ARE SYMPTOMS THAT SHOULD BE REPORTED IMMEDIATELY:  *FEVER GREATER THAN 100.5 F  *CHILLS WITH OR WITHOUT FEVER  NAUSEA AND VOMITING THAT IS NOT CONTROLLED WITH YOUR NAUSEA MEDICATION  *UNUSUAL SHORTNESS OF BREATH  *UNUSUAL BRUISING OR BLEEDING  TENDERNESS IN MOUTH AND THROAT WITH OR WITHOUT PRESENCE OF ULCERS  *URINARY PROBLEMS  *BOWEL PROBLEMS  UNUSUAL RASH Items with * indicate a potential emergency and should be followed up as soon as possible.  Feel free to call the clinic should you have any questions or concerns. The clinic phone number is [phone].  Please show the [facility] at check-in to the Emergency Department and triage nurse.

## 2018-10-11 ENCOUNTER — Other Ambulatory Visit: Payer: Self-pay | Admitting: Oncology

## 2018-10-14 ENCOUNTER — Inpatient Hospital Stay: Payer: Medicaid Other

## 2018-10-14 ENCOUNTER — Encounter (HOSPITAL_COMMUNITY): Payer: Self-pay

## 2018-10-14 ENCOUNTER — Other Ambulatory Visit: Payer: Medicaid Other

## 2018-10-14 ENCOUNTER — Ambulatory Visit (HOSPITAL_COMMUNITY)
Admission: RE | Admit: 2018-10-14 | Discharge: 2018-10-14 | Disposition: A | Discharge status: Home / Self Care | Payer: Medicaid Other | Source: Ambulatory Visit | Attending: Oncology | Admitting: Oncology

## 2018-10-14 DIAGNOSIS — Z5111 Encounter for antineoplastic chemotherapy: Secondary | ICD-10-CM | POA: Diagnosis not present

## 2018-10-14 DIAGNOSIS — C160 Malignant neoplasm of cardia: Secondary | ICD-10-CM | POA: Diagnosis not present

## 2018-10-14 DIAGNOSIS — Z95828 Presence of other vascular implants and grafts: Secondary | ICD-10-CM

## 2018-10-14 LAB — CBC WITH DIFFERENTIAL (CANCER CENTER ONLY)
Abs Immature Granulocytes: 0.02 10*3/uL (ref 0.00–0.07)
Basophils Absolute: 0.1 10*3/uL (ref 0.0–0.1)
Basophils Relative: 1 %
Eosinophils Absolute: 0.2 10*3/uL (ref 0.0–0.5)
Eosinophils Relative: 3 %
HCT: 37.1 % — ABNORMAL LOW (ref 39.0–52.0)
HEMOGLOBIN: 12 g/dL — AB (ref 13.0–17.0)
IMMATURE GRANULOCYTES: 0 %
Lymphocytes Relative: 33 %
Lymphs Abs: 1.6 10*3/uL (ref 0.7–4.0)
MCH: 28.9 pg (ref 26.0–34.0)
MCHC: 32.3 g/dL (ref 30.0–36.0)
MCV: 89.4 fL (ref 80.0–100.0)
Monocytes Absolute: 0.7 10*3/uL (ref 0.1–1.0)
Monocytes Relative: 14 %
NEUTROS PCT: 49 %
Neutro Abs: 2.3 10*3/uL (ref 1.7–7.7)
Platelet Count: 228 10*3/uL (ref 150–400)
RBC: 4.15 MIL/uL — ABNORMAL LOW (ref 4.22–5.81)
RDW: 17.2 % — ABNORMAL HIGH (ref 11.5–15.5)
WBC Count: 4.9 10*3/uL (ref 4.0–10.5)
nRBC: 0 % (ref 0.0–0.2)

## 2018-10-14 LAB — CMP (CANCER CENTER ONLY)
ALT: 25 U/L (ref 0–44)
AST: 19 U/L (ref 15–41)
Albumin: 3.9 g/dL (ref 3.5–5.0)
Alkaline Phosphatase: 78 U/L (ref 38–126)
Anion gap: 10 (ref 5–15)
BUN: 10 mg/dL (ref 6–20)
CALCIUM: 9.9 mg/dL (ref 8.9–10.3)
CO2: 24 mmol/L (ref 22–32)
CREATININE: 0.77 mg/dL (ref 0.61–1.24)
Chloride: 109 mmol/L (ref 98–111)
GFR, Est AFR Am: >60 mL/min (ref >60)
Glucose, Bld: 102 mg/dL — ABNORMAL HIGH (ref 70–99)
Potassium: 4 mmol/L (ref 3.5–5.1)
Sodium: 143 mmol/L (ref 135–145)
Total Bilirubin: 0.4 mg/dL (ref 0.3–1.2)
Total Protein: 7.1 g/dL (ref 6.5–8.1)

## 2018-10-14 LAB — TOTAL PROTEIN, URINE DIPSTICK: Protein, ur: NEGATIVE mg/dL

## 2018-10-14 MED ORDER — SODIUM CHLORIDE (PF) 0.9 % IJ SOLN
INTRAMUSCULAR | Status: AC
  Filled 2018-10-14: qty 50

## 2018-10-14 MED ORDER — SODIUM CHLORIDE 0.9% FLUSH
10.0000 mL | INTRAVENOUS | Status: DC | PRN
  Administered 2018-10-14: 10 mL via INTRAVENOUS
  Filled 2018-10-14: qty 10

## 2018-10-14 MED ORDER — HEPARIN SOD (PORK) LOCK FLUSH 100 UNIT/ML IV SOLN
500.0000 [IU] | Freq: Once | INTRAVENOUS | Status: AC
  Administered 2018-10-14: 500 [IU] via INTRAVENOUS

## 2018-10-14 MED ORDER — HEPARIN SOD (PORK) LOCK FLUSH 100 UNIT/ML IV SOLN
INTRAVENOUS | Status: AC
  Filled 2018-10-14: qty 5

## 2018-10-14 MED ORDER — IOHEXOL 300 MG/ML  SOLN
75.0000 mL | Freq: Once | INTRAMUSCULAR | Status: AC | PRN
  Administered 2018-10-14: 75 mL via INTRAVENOUS

## 2018-10-15 ENCOUNTER — Encounter: Payer: Self-pay | Admitting: Nurse Practitioner

## 2018-10-15 ENCOUNTER — Telehealth: Payer: Self-pay | Admitting: Oncology

## 2018-10-15 ENCOUNTER — Inpatient Hospital Stay: Payer: Medicaid Other

## 2018-10-15 ENCOUNTER — Inpatient Hospital Stay (HOSPITAL_BASED_OUTPATIENT_CLINIC_OR_DEPARTMENT_OTHER): Payer: Medicaid Other | Admitting: Nurse Practitioner

## 2018-10-15 VITALS — BP 140/92 | HR 88 | Temp 97.7°F | Resp 18 | Ht 66.0 in | Wt 164.9 lb

## 2018-10-15 DIAGNOSIS — R197 Diarrhea, unspecified: Secondary | ICD-10-CM

## 2018-10-15 DIAGNOSIS — C770 Secondary and unspecified malignant neoplasm of lymph nodes of head, face and neck: Secondary | ICD-10-CM | POA: Diagnosis not present

## 2018-10-15 DIAGNOSIS — Z5111 Encounter for antineoplastic chemotherapy: Secondary | ICD-10-CM | POA: Diagnosis not present

## 2018-10-15 DIAGNOSIS — R110 Nausea: Secondary | ICD-10-CM

## 2018-10-15 DIAGNOSIS — C160 Malignant neoplasm of cardia: Secondary | ICD-10-CM

## 2018-10-15 DIAGNOSIS — I10 Essential (primary) hypertension: Secondary | ICD-10-CM

## 2018-10-15 DIAGNOSIS — E119 Type 2 diabetes mellitus without complications: Secondary | ICD-10-CM

## 2018-10-15 DIAGNOSIS — H5461 Unqualified visual loss, right eye, normal vision left eye: Secondary | ICD-10-CM

## 2018-10-15 MED ORDER — ACETAMINOPHEN 325 MG PO TABS
650.0000 mg | ORAL_TABLET | Freq: Once | ORAL | Status: AC
  Administered 2018-10-15: 650 mg via ORAL

## 2018-10-15 MED ORDER — DEXAMETHASONE SODIUM PHOSPHATE 10 MG/ML IJ SOLN
10.0000 mg | Freq: Once | INTRAMUSCULAR | Status: AC
  Administered 2018-10-15: 10 mg via INTRAVENOUS

## 2018-10-15 MED ORDER — SODIUM CHLORIDE 0.9 % IV SOLN
80.0000 mg/m2 | Freq: Once | INTRAVENOUS | Status: AC
  Administered 2018-10-15: 150 mg via INTRAVENOUS
  Filled 2018-10-15: qty 25

## 2018-10-15 MED ORDER — FAMOTIDINE IN NACL 20-0.9 MG/50ML-% IV SOLN
20.0000 mg | Freq: Once | INTRAVENOUS | Status: AC
  Administered 2018-10-15: 20 mg via INTRAVENOUS

## 2018-10-15 MED ORDER — LIDOCAINE-PRILOCAINE 2.5-2.5 % EX CREA: 1.0000 "application " | TOPICAL_CREAM | CUTANEOUS | 0 refills | Status: AC | PRN

## 2018-10-15 MED ORDER — DIPHENHYDRAMINE HCL 50 MG/ML IJ SOLN
INTRAMUSCULAR | Status: AC
  Filled 2018-10-15: qty 1

## 2018-10-15 MED ORDER — DEXAMETHASONE SODIUM PHOSPHATE 10 MG/ML IJ SOLN
INTRAMUSCULAR | Status: AC
  Filled 2018-10-15: qty 1

## 2018-10-15 MED ORDER — ONDANSETRON HCL 8 MG PO TABS: 8.0000 mg | ORAL_TABLET | Freq: Three times a day (TID) | ORAL | 3 refills | Status: DC | PRN

## 2018-10-15 MED ORDER — DIPHENHYDRAMINE HCL 50 MG/ML IJ SOLN
25.0000 mg | Freq: Once | INTRAMUSCULAR | Status: AC
  Administered 2018-10-15: 25 mg via INTRAVENOUS

## 2018-10-15 MED ORDER — ACETAMINOPHEN 325 MG PO TABS
ORAL_TABLET | ORAL | Status: AC
  Filled 2018-10-15: qty 2

## 2018-10-15 MED ORDER — SODIUM CHLORIDE 0.9 % IV SOLN
8.0000 mg/kg | Freq: Once | INTRAVENOUS | Status: AC
  Administered 2018-10-15: 600 mg via INTRAVENOUS
  Filled 2018-10-15: qty 50

## 2018-10-15 MED ORDER — SODIUM CHLORIDE 0.9 % IV SOLN
Freq: Once | INTRAVENOUS | Status: AC
  Administered 2018-10-15: 10:00:00 via INTRAVENOUS
  Filled 2018-10-15: qty 250

## 2018-10-15 MED ORDER — FAMOTIDINE IN NACL 20-0.9 MG/50ML-% IV SOLN
INTRAVENOUS | Status: AC
  Filled 2018-10-15: qty 50

## 2018-10-15 MED ORDER — HEPARIN SOD (PORK) LOCK FLUSH 100 UNIT/ML IV SOLN
500.0000 [IU] | Freq: Once | INTRAVENOUS | Status: AC | PRN
  Administered 2018-10-15: 500 [IU]
  Filled 2018-10-15: qty 5

## 2018-10-15 MED ORDER — SODIUM CHLORIDE 0.9% FLUSH
10.0000 mL | INTRAVENOUS | Status: DC | PRN
  Administered 2018-10-15: 10 mL
  Filled 2018-10-15: qty 10

## 2018-10-15 NOTE — Progress Notes (Addendum)
[facility] OFFICE PROGRESS NOTE   Diagnosis: Gastric cancer  INTERVAL HISTORY:   Robert Beck returns as scheduled.  He completed another cycle of Taxol/ramucirumab 10/01/2018.  He has mild intermittent nausea relieved with Zofran.  He has periodic diarrhea and takes Imodium as needed.  He notes no significant change in these issues following chemotherapy.  No mouth sores.  No rash.  He reports a good appetite.  Stable blurry vision in the right eye.  He notes mild progression of the numbness in his hands and feet.  The numbness does not interfere with activity.  Objective:  Vital signs in last 24 hours:  Blood pressure (!) 140/92, pulse 88, temperature 97.7 F (36.5 C), temperature source Oral, resp. rate 18, height '5\' 6"'  (1.676 m), weight 164 lb 14.4 oz (74.8 kg), SpO2 98 %.    HEENT: No thrush or ulcers. Lymphatics: No palpable cervical or supraclavicular lymph nodes. Resp: Lungs clear bilaterally. Cardio: Regular rate and rhythm. GI: Abdomen soft and nontender.  No hepatomegaly. Vascular: No leg edema. Neuro: Mild decrease in vibratory sense over the fingertips bilaterally per tuning fork exam. Skin: No rash. Port-A-Cath without erythema.   Lab Results:  Lab Results  Component Value Date   WBC 4.9 10/14/2018   HGB 12.0 (L) 10/14/2018   HCT 37.1 (L) 10/14/2018   MCV 89.4 10/14/2018   PLT 228 10/14/2018   NEUTROABS 2.3 10/14/2018    Imaging:  Ct Soft Tissue Neck W Contrast  Result Date: 10/14/2018 CLINICAL DATA:  Gastric cancer undergoing chemotherapy. EXAM: CT NECK WITH CONTRAST TECHNIQUE: Multidetector CT imaging of the neck was performed using the standard protocol following the bolus administration of intravenous contrast. CONTRAST:  87m OMNIPAQUE IOHEXOL 300 MG/ML  SOLN COMPARISON:  05/23/2018 FINDINGS: Pharynx and larynx: No evident mass or swelling. Motion at the level of the oral cavity and upper pharynx. Salivary glands: Motion artifact affects  the parotids. No salivary abnormality is noted. Thyroid: Negative Lymph nodes: Heterogeneous lymph node in the low right jugular chain with indistinct adjacent fat, unchanged in maximal diameter of 19 mm on axial slices. There is a lobulation along the inferior border that is more apparent than before, but craniocaudal dimensions are also essentially stable at 27 mm. A right subclavian lymph node measures 13 mm short axis, essentially unchanged when allowing for differences in imaging angle. Vascular: No newly enlarged lymph node. Limited intracranial: Negative Visualized orbits: Negative Mastoids and visualized paranasal sinuses: Mild mucosal thickening in the paranasal sinuses. Skeleton: No acute or aggressive finding. Upper chest: Reported separately IMPRESSION: 1. Malignant adenopathy in the low right neck. There has been some evolution of nodal shape but no significant change in size. Overall stable disease in the neck. 2. Chest reported separately. Electronically Signed   By: JMonte FantasiaM.D.   On: 10/14/2018 11:32   Ct Chest W Contrast  Result Date: 10/14/2018 CLINICAL DATA:  Gastric carcinoma diagnosed 2017. Chemotherapy in progress. EXAM: CT CHEST WITH CONTRAST TECHNIQUE: Multidetector CT imaging of the chest was performed during intravenous contrast administration. CONTRAST:  748mOMNIPAQUE IOHEXOL 300 MG/ML  SOLN COMPARISON:  CT 05/23/2018 FINDINGS: Cardiovascular: Port in the anterior chest wall with tip in distal SVC. Coronary artery calcification and aortic atherosclerotic calcification. Mediastinum/Nodes: No axillary adenopathy. RIGHT supraclavicular lymph node measuring 16 mm (image number 1/4) is not imaged on prior. More inferior supraclavicular node measuring 10 mm is unchanged. RIGHT lower paratracheal lymph node measuring 16 mm (image 24/4) is unchanged. Prevascular lymph  node measuring 13 mm is unchanged. No hilar adenopathy. Lungs/Pleura: No suspicious pulmonary nodules. Upper Abdomen:  Limited view of the liver, kidneys, pancreas are unremarkable. Normal adrenal glands. Musculoskeletal: No aggressive osseous lesion. Chronic compression deformity in the midthoracic spine. IMPRESSION: 1. Stable mediastinal adenopathy. 2. Large RIGHT supraclavicular node not imaged on comparison exam. 3. No pulmonary metastasis. Electronically Signed   By: Suzy Bouchard M.D.   On: 10/14/2018 12:43    Medications: I have reviewed the patient's current medications.  Assessment/Plan: 1. 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 Decreased gastric cardia wall thickening.  Cycle 7 FOLFOX 07/25/2016  Cycle 8 FOLFOX 08/08/2016  Cycle 9 FOLFOX 08/22/2016 (oxaliplatin held due to neuropathy)  Cycle 10 FOLFOX 09/05/2016 (oxaliplatin held)  Cycle 11 FOLFOX 09/26/2016 (oxaliplatin held)  Cycle 12 FOLFOX 10/10/2016 (oxaliplatin held)  CT 10/18/2016-mild enlargement of mediastinal lymph nodes, no other evidence of disease progression, stable thickening at the GE junction and stable gastrohepatic lymphadenopathy  Cycle 13 FOLFOX 10/24/2016 (oxaliplatin held)  Maintenance Xeloda 7 days on/7 days off beginning 11/17/2016  Restaging chest CT 01/22/2017-interval progression of metastatic  lymphadenopathy in the mediastinum and gastrohepatic ligament.  Salvage therapy with FOLFIRI initiated 02/20/2017  Cycle 5 FOLFIRI 04/17/2017  Restaging chest CT 04/29/2017-mild improvement in mediastinal adenopathy. No significant change in adenopathy in the gastrohepatic ligament. No evidence of disease progression.  Cycle 6 FOLFIRI 05/01/2017  Cycle 7 FOLFIRI 05/15/2017  Cycle 8 FOLFIRI 05/29/2017  Cycle 9 FOLFIRI 06/19/2017  Cycle 10 FOLFIRI 07/03/2017  Cycle 11 FOLFIRI 07/17/2017  CT chest 07/30/2017-mild decrease in mediastinal and gastrohepatic ligament lymphadenopathy, no evidence of progressive disease  Cycle 12 FOLFIRI 07/31/2017  Cycle 13 FOLFIRI 08/14/2017  Cycle 14 FOLFIRI 09/04/2017  Cycle 15 FOLFIRI 09/25/2017  Cycle 16 FOLFIRI 10/16/2017  Cycle 17 FOLFIRI 11/06/2017  Cycle 18 FOLFIRI 11/27/2017  cycle 19 FOLFIRI 12/18/2017  CT 01/06/2018-stable neck/mediastinal nodes, slight enlargement of gastrohepatic ligament node stable periportal and portacaval nodes  Cycle 20 FOLFIRI 01/08/2018  Cycle 21 FOLFIRI 01/29/2018  CT neck 02/06/2018- 4 cm necrotic right levelIII/IVlymph node similar to the chest CT 01/06/2018, considerably larger than on 07/30/2017. Unchanged adjacent right level IV/supraclavicular lymph node and partially visualized mediastinal lymph nodes.  Cycle 1 Taxol/ramucirumab 03/05/2018  Core biopsy right cervical lymph node 02/28/2018-metastatic adenocarcinoma, HER-2 negative PDL 1 score-60, MSI-stable, tumor mutational burden-10  Cycle 2 Taxol/ramucirumab7/06/2018  Cycle 3 Taxol/ramucirumab 04/30/2018  CTs 05/23/2018- decrease in mediastinal and gastrohepatic ligament adenopathy, decreased right sided level 3/4 node  Cycle 4 Taxol/ramucirumab 05/28/2018  Cycle 5 Taxol/ramucirumab 06/24/2018 (day 1 ramucirumab held secondary to right eye vision loss)  Cycle 6 Taxol/ramucirumab 07/23/2018  Cycle 7 Taxol/ramucirumab 08/19/2018  Cycle 8  Taxol/ramucirumab 09/15/2018  Restaging CTs 10/14/2018-stable mediastinal adenopathy, stable disease in the neck.  Cycle 9 Taxol/ramucirumab 10/15/2018  2. Anemia secondary to #1-improved  3. Diabetes  4. Gout  5. Hypertension  6. Hyperlipidemia  7. History of a T7 compression fracture  8. Pain  secondary to the gastric mass and mediastinal lymphadenopathy-resolved  9. Port-A-Cath placement 04/20/2016  10. 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 1 mm obstructing stone distal right ureter/right ureteral vesicle junction on CT 09/13/2016.  13. 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 Hypercalcemia 12/19/2016-asymptomatic  15. Nausea following FOLFIRI-emend added beginning with cycle 3, trial of Reglan beginning12/08/2017, persistent  16.Erythematous, mildly indurated rash right upper posterior leg in setting of possible recent tick bitestatus post course of doxycycline.Resolved.  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  Left tongue ulcer during cycle 7 Taxol/ramucirumab    Disposition: Mr. Payette appears unchanged.  He has completed 8 cycles of Taxol/ramucirumab.  The restaging CTs from yesterday show stable disease.  Plan to continue Taxol/ramucirumab, cycle 9 beginning today.  He has mild progression of neuropathy symptoms.  Symptoms do not interfere with activity.  Plan to continue Taxol for now and monitor closely.  He will return for lab, follow-up and the next cycle of Taxol/ramucirumab 11/12/2018.  He will contact the office in the interim with any problems.  Patient seen with Dr. Benay Spice.  25  minutes were spent face-to-face at today's visit with the majority of that time involved in counseling/coordination of care.    Ned Card ANP/GNP-BC   10/15/2018  9:17 AM  This was a shared visit with Ned Card.  Mr. Helt continues treatment with Taxol/ramucirumab.  The restaging CTs reveal stable disease.  We discussed treatment options with Mr. Roulhac and his wife.  The plan is to continue the current treatment and hold Taxol if he develops progressive neuropathy.  We discussed additional treatment options including continuation of ramucirumab or switching to a PD1 inhibitor.  Julieanne Manson, MD

## 2018-10-15 NOTE — Patient Instructions (Signed)
[facility] Discharge Instructions for Patients Receiving Chemotherapy  Today you received the following chemotherapy agents Taxol and Cyramza  To help prevent nausea and vomiting after your treatment, we encourage you to take your nausea medication as directed.    If you develop nausea and vomiting that is not controlled by your nausea medication, call the clinic.   BELOW ARE SYMPTOMS THAT SHOULD BE REPORTED IMMEDIATELY:  *FEVER GREATER THAN 100.5 F  *CHILLS WITH OR WITHOUT FEVER  NAUSEA AND VOMITING THAT IS NOT CONTROLLED WITH YOUR NAUSEA MEDICATION  *UNUSUAL SHORTNESS OF BREATH  *UNUSUAL BRUISING OR BLEEDING  TENDERNESS IN MOUTH AND THROAT WITH OR WITHOUT PRESENCE OF ULCERS  *URINARY PROBLEMS  *BOWEL PROBLEMS  UNUSUAL RASH Items with * indicate a potential emergency and should be followed up as soon as possible.  Feel free to call the clinic should you have any questions or concerns. The clinic phone number is [phone].  Please show the [facility] at check-in to the Emergency Department and triage nurse.

## 2018-10-15 NOTE — Telephone Encounter (Signed)
Scheduled appt per 01/22 los.  Added treatment for 01/29 to the book for approval.  Printed calendar and avs.

## 2018-10-16 ENCOUNTER — Telehealth: Payer: Self-pay | Admitting: Oncology

## 2018-10-16 NOTE — Telephone Encounter (Signed)
Called patient to let the patient know the treatment appt has been added.  Patient is aware of appt.

## 2018-10-19 ENCOUNTER — Other Ambulatory Visit: Payer: Self-pay | Admitting: Oncology

## 2018-10-22 ENCOUNTER — Inpatient Hospital Stay: Payer: Medicaid Other

## 2018-10-22 ENCOUNTER — Other Ambulatory Visit: Payer: Medicaid Other

## 2018-10-22 VITALS — BP 150/95 | HR 90 | Temp 97.9°F | Resp 16

## 2018-10-22 DIAGNOSIS — C160 Malignant neoplasm of cardia: Secondary | ICD-10-CM

## 2018-10-22 DIAGNOSIS — Z5111 Encounter for antineoplastic chemotherapy: Secondary | ICD-10-CM | POA: Diagnosis not present

## 2018-10-22 DIAGNOSIS — Z95828 Presence of other vascular implants and grafts: Secondary | ICD-10-CM

## 2018-10-22 LAB — CMP (CANCER CENTER ONLY)
ALT: 23 U/L (ref 0–44)
AST: 18 U/L (ref 15–41)
Albumin: 4 g/dL (ref 3.5–5.0)
Alkaline Phosphatase: 69 U/L (ref 38–126)
Anion gap: 8 (ref 5–15)
BILIRUBIN TOTAL: 0.6 mg/dL (ref 0.3–1.2)
BUN: 11 mg/dL (ref 6–20)
CO2: 24 mmol/L (ref 22–32)
Calcium: 9.6 mg/dL (ref 8.9–10.3)
Chloride: 108 mmol/L (ref 98–111)
Creatinine: 0.81 mg/dL (ref 0.61–1.24)
GFR, Est AFR Am: >60 mL/min (ref >60)
GFR, Estimated: >60 mL/min (ref >60)
Glucose, Bld: 169 mg/dL — ABNORMAL HIGH (ref 70–99)
Potassium: 4.1 mmol/L (ref 3.5–5.1)
Sodium: 140 mmol/L (ref 135–145)
TOTAL PROTEIN: 7.3 g/dL (ref 6.5–8.1)

## 2018-10-22 LAB — CBC WITH DIFFERENTIAL (CANCER CENTER ONLY)
Abs Immature Granulocytes: 0.08 10*3/uL — ABNORMAL HIGH (ref 0.00–0.07)
Basophils Absolute: 0.1 10*3/uL (ref 0.0–0.1)
Basophils Relative: 1 %
EOS ABS: 0.2 10*3/uL (ref 0.0–0.5)
Eosinophils Relative: 3 %
HCT: 36.9 % — ABNORMAL LOW (ref 39.0–52.0)
Hemoglobin: 12.2 g/dL — ABNORMAL LOW (ref 13.0–17.0)
Immature Granulocytes: 1 %
Lymphocytes Relative: 27 %
Lymphs Abs: 1.9 10*3/uL (ref 0.7–4.0)
MCH: 28.8 pg (ref 26.0–34.0)
MCHC: 33.1 g/dL (ref 30.0–36.0)
MCV: 87.2 fL (ref 80.0–100.0)
Monocytes Absolute: 0.3 10*3/uL (ref 0.1–1.0)
Monocytes Relative: 4 %
Neutro Abs: 4.5 10*3/uL (ref 1.7–7.7)
Neutrophils Relative %: 64 %
Platelet Count: 250 10*3/uL (ref 150–400)
RBC: 4.23 MIL/uL (ref 4.22–5.81)
RDW: 16.5 % — ABNORMAL HIGH (ref 11.5–15.5)
WBC Count: 7.1 10*3/uL (ref 4.0–10.5)
nRBC: 0.3 % — ABNORMAL HIGH (ref 0.0–0.2)

## 2018-10-22 MED ORDER — SODIUM CHLORIDE 0.9% FLUSH
10.0000 mL | INTRAVENOUS | Status: DC | PRN
  Administered 2018-10-22: 10 mL
  Filled 2018-10-22: qty 10

## 2018-10-22 MED ORDER — HEPARIN SOD (PORK) LOCK FLUSH 100 UNIT/ML IV SOLN
500.0000 [IU] | Freq: Once | INTRAVENOUS | Status: AC | PRN
  Administered 2018-10-22: 500 [IU]
  Filled 2018-10-22: qty 5

## 2018-10-22 MED ORDER — SODIUM CHLORIDE 0.9 % IV SOLN
80.0000 mg/m2 | Freq: Once | INTRAVENOUS | Status: AC
  Administered 2018-10-22: 150 mg via INTRAVENOUS
  Filled 2018-10-22: qty 25

## 2018-10-22 MED ORDER — DIPHENHYDRAMINE HCL 50 MG/ML IJ SOLN
25.0000 mg | Freq: Once | INTRAMUSCULAR | Status: AC
  Administered 2018-10-22: 25 mg via INTRAVENOUS

## 2018-10-22 MED ORDER — DEXAMETHASONE SODIUM PHOSPHATE 10 MG/ML IJ SOLN
INTRAMUSCULAR | Status: AC
  Filled 2018-10-22: qty 1

## 2018-10-22 MED ORDER — DIPHENHYDRAMINE HCL 50 MG/ML IJ SOLN
INTRAMUSCULAR | Status: AC
  Filled 2018-10-22: qty 1

## 2018-10-22 MED ORDER — DEXAMETHASONE SODIUM PHOSPHATE 10 MG/ML IJ SOLN
10.0000 mg | Freq: Once | INTRAMUSCULAR | Status: AC
  Administered 2018-10-22: 10 mg via INTRAVENOUS

## 2018-10-22 MED ORDER — FAMOTIDINE IN NACL 20-0.9 MG/50ML-% IV SOLN
INTRAVENOUS | Status: AC
  Filled 2018-10-22: qty 50

## 2018-10-22 MED ORDER — FAMOTIDINE IN NACL 20-0.9 MG/50ML-% IV SOLN
20.0000 mg | Freq: Once | INTRAVENOUS | Status: AC
  Administered 2018-10-22: 20 mg via INTRAVENOUS

## 2018-10-22 MED ORDER — SODIUM CHLORIDE 0.9 % IV SOLN
Freq: Once | INTRAVENOUS | Status: AC
  Administered 2018-10-22: 09:00:00 via INTRAVENOUS
  Filled 2018-10-22: qty 250

## 2018-10-22 MED ORDER — SODIUM CHLORIDE 0.9% FLUSH
10.0000 mL | INTRAVENOUS | Status: DC | PRN
  Administered 2018-10-22: 10 mL via INTRAVENOUS
  Filled 2018-10-22: qty 10

## 2018-10-22 NOTE — Patient Instructions (Signed)
[facility] for [facility] Receiving Chemotherapy  Today you received the following chemotherapy agents :  Taxol.  To help prevent nausea and vomiting after your treatment, we encourage you to take your nausea medication as prescribed.   If you develop nausea and vomiting that is not controlled by your nausea medication, call the clinic.   BELOW ARE SYMPTOMS THAT SHOULD BE REPORTED IMMEDIATELY:  *FEVER GREATER THAN 100.5 F  *CHILLS WITH OR WITHOUT FEVER  NAUSEA AND VOMITING THAT IS NOT CONTROLLED WITH YOUR NAUSEA MEDICATION  *UNUSUAL SHORTNESS OF BREATH  *UNUSUAL BRUISING OR BLEEDING  TENDERNESS IN MOUTH AND THROAT WITH OR WITHOUT PRESENCE OF ULCERS  *URINARY PROBLEMS  *BOWEL PROBLEMS  UNUSUAL RASH Items with * indicate a potential emergency and should be followed up as soon as possible.  Feel free to call the clinic should you have any questions or concerns. The clinic phone number is [phone].  Please show the CHEMO ALERT CARD at check-in to the Emergency Department and triage nurse.   

## 2018-10-22 NOTE — Progress Notes (Signed)
Ok to premed without CMET results per Dr. Benay Spice

## 2018-10-29 ENCOUNTER — Inpatient Hospital Stay: Payer: Medicaid Other | Attending: Oncology

## 2018-10-29 ENCOUNTER — Inpatient Hospital Stay: Payer: Medicaid Other

## 2018-10-29 VITALS — BP 147/90 | HR 91 | Temp 98.6°F | Resp 18

## 2018-10-29 DIAGNOSIS — Z79899 Other long term (current) drug therapy: Secondary | ICD-10-CM | POA: Diagnosis not present

## 2018-10-29 DIAGNOSIS — Z5111 Encounter for antineoplastic chemotherapy: Secondary | ICD-10-CM | POA: Insufficient documentation

## 2018-10-29 DIAGNOSIS — C771 Secondary and unspecified malignant neoplasm of intrathoracic lymph nodes: Secondary | ICD-10-CM | POA: Insufficient documentation

## 2018-10-29 DIAGNOSIS — C160 Malignant neoplasm of cardia: Secondary | ICD-10-CM | POA: Diagnosis present

## 2018-10-29 DIAGNOSIS — M109 Gout, unspecified: Secondary | ICD-10-CM | POA: Insufficient documentation

## 2018-10-29 DIAGNOSIS — G893 Neoplasm related pain (acute) (chronic): Secondary | ICD-10-CM | POA: Diagnosis not present

## 2018-10-29 DIAGNOSIS — E119 Type 2 diabetes mellitus without complications: Secondary | ICD-10-CM | POA: Diagnosis not present

## 2018-10-29 DIAGNOSIS — I10 Essential (primary) hypertension: Secondary | ICD-10-CM | POA: Insufficient documentation

## 2018-10-29 DIAGNOSIS — Z95828 Presence of other vascular implants and grafts: Secondary | ICD-10-CM

## 2018-10-29 DIAGNOSIS — Z5112 Encounter for antineoplastic immunotherapy: Secondary | ICD-10-CM | POA: Diagnosis present

## 2018-10-29 LAB — CBC WITH DIFFERENTIAL (CANCER CENTER ONLY)
Abs Immature Granulocytes: 0.02 10*3/uL (ref 0.00–0.07)
BASOS PCT: 1 %
Basophils Absolute: 0 10*3/uL (ref 0.0–0.1)
Eosinophils Absolute: 0.2 10*3/uL (ref 0.0–0.5)
Eosinophils Relative: 5 %
HCT: 37.7 % — ABNORMAL LOW (ref 39.0–52.0)
Hemoglobin: 12.3 g/dL — ABNORMAL LOW (ref 13.0–17.0)
Immature Granulocytes: 1 %
Lymphocytes Relative: 43 %
Lymphs Abs: 1.6 10*3/uL (ref 0.7–4.0)
MCH: 28.7 pg (ref 26.0–34.0)
MCHC: 32.6 g/dL (ref 30.0–36.0)
MCV: 88.1 fL (ref 80.0–100.0)
MONOS PCT: 6 %
Monocytes Absolute: 0.2 10*3/uL (ref 0.1–1.0)
Neutro Abs: 1.5 10*3/uL — ABNORMAL LOW (ref 1.7–7.7)
Neutrophils Relative %: 44 %
Platelet Count: 244 10*3/uL (ref 150–400)
RBC: 4.28 MIL/uL (ref 4.22–5.81)
RDW: 16.9 % — ABNORMAL HIGH (ref 11.5–15.5)
WBC Count: 3.5 10*3/uL — ABNORMAL LOW (ref 4.0–10.5)
nRBC: 0 % (ref 0.0–0.2)

## 2018-10-29 LAB — CMP (CANCER CENTER ONLY)
ALBUMIN: 4 g/dL (ref 3.5–5.0)
ALT: 28 U/L (ref 0–44)
AST: 23 U/L (ref 15–41)
Alkaline Phosphatase: 76 U/L (ref 38–126)
Anion gap: 9 (ref 5–15)
BUN: 11 mg/dL (ref 6–20)
CO2: 25 mmol/L (ref 22–32)
Calcium: 10 mg/dL (ref 8.9–10.3)
Chloride: 109 mmol/L (ref 98–111)
Creatinine: 0.85 mg/dL (ref 0.61–1.24)
GFR, Est AFR Am: >60 mL/min (ref >60)
GFR, Estimated: >60 mL/min (ref >60)
Glucose, Bld: 141 mg/dL — ABNORMAL HIGH (ref 70–99)
Potassium: 3.9 mmol/L (ref 3.5–5.1)
Sodium: 143 mmol/L (ref 135–145)
TOTAL PROTEIN: 7.4 g/dL (ref 6.5–8.1)
Total Bilirubin: 0.6 mg/dL (ref 0.3–1.2)

## 2018-10-29 LAB — TOTAL PROTEIN, URINE DIPSTICK: PROTEIN: 30 mg/dL — AB

## 2018-10-29 MED ORDER — FAMOTIDINE IN NACL 20-0.9 MG/50ML-% IV SOLN
20.0000 mg | Freq: Once | INTRAVENOUS | Status: AC
  Administered 2018-10-29: 20 mg via INTRAVENOUS

## 2018-10-29 MED ORDER — ACETAMINOPHEN 325 MG PO TABS
650.0000 mg | ORAL_TABLET | Freq: Once | ORAL | Status: AC
  Administered 2018-10-29: 650 mg via ORAL

## 2018-10-29 MED ORDER — HEPARIN SOD (PORK) LOCK FLUSH 100 UNIT/ML IV SOLN
500.0000 [IU] | Freq: Once | INTRAVENOUS | Status: AC | PRN
  Administered 2018-10-29: 500 [IU]
  Filled 2018-10-29: qty 5

## 2018-10-29 MED ORDER — SODIUM CHLORIDE 0.9% FLUSH
10.0000 mL | INTRAVENOUS | Status: DC | PRN
  Administered 2018-10-29: 10 mL via INTRAVENOUS
  Filled 2018-10-29: qty 10

## 2018-10-29 MED ORDER — ACETAMINOPHEN 325 MG PO TABS
ORAL_TABLET | ORAL | Status: AC
  Filled 2018-10-29: qty 2

## 2018-10-29 MED ORDER — DIPHENHYDRAMINE HCL 50 MG/ML IJ SOLN
INTRAMUSCULAR | Status: AC
  Filled 2018-10-29: qty 1

## 2018-10-29 MED ORDER — SODIUM CHLORIDE 0.9 % IV SOLN
Freq: Once | INTRAVENOUS | Status: AC
  Administered 2018-10-29: 09:00:00 via INTRAVENOUS
  Filled 2018-10-29: qty 250

## 2018-10-29 MED ORDER — SODIUM CHLORIDE 0.9% FLUSH
10.0000 mL | INTRAVENOUS | Status: DC | PRN
  Administered 2018-10-29: 10 mL
  Filled 2018-10-29: qty 10

## 2018-10-29 MED ORDER — DEXAMETHASONE SODIUM PHOSPHATE 10 MG/ML IJ SOLN
10.0000 mg | Freq: Once | INTRAMUSCULAR | Status: AC
  Administered 2018-10-29: 10 mg via INTRAVENOUS

## 2018-10-29 MED ORDER — DIPHENHYDRAMINE HCL 50 MG/ML IJ SOLN
25.0000 mg | Freq: Once | INTRAMUSCULAR | Status: AC
  Administered 2018-10-29: 25 mg via INTRAVENOUS

## 2018-10-29 MED ORDER — SODIUM CHLORIDE 0.9 % IV SOLN
8.0000 mg/kg | Freq: Once | INTRAVENOUS | Status: AC
  Administered 2018-10-29: 600 mg via INTRAVENOUS
  Filled 2018-10-29: qty 50

## 2018-10-29 MED ORDER — SODIUM CHLORIDE 0.9 % IV SOLN
80.0000 mg/m2 | Freq: Once | INTRAVENOUS | Status: AC
  Administered 2018-10-29: 150 mg via INTRAVENOUS
  Filled 2018-10-29: qty 25

## 2018-10-29 MED ORDER — DEXAMETHASONE SODIUM PHOSPHATE 10 MG/ML IJ SOLN
INTRAMUSCULAR | Status: AC
  Filled 2018-10-29: qty 1

## 2018-10-29 MED ORDER — FAMOTIDINE IN NACL 20-0.9 MG/50ML-% IV SOLN
INTRAVENOUS | Status: AC
  Filled 2018-10-29: qty 50

## 2018-10-29 NOTE — Patient Instructions (Signed)
[facility] Discharge Instructions for Patients Receiving Chemotherapy  Today you received the following chemotherapy agents Cyramza and Taxol  To help prevent nausea and vomiting after your treatment, we encourage you to take your nausea medication as directed.    If you develop nausea and vomiting that is not controlled by your nausea medication, call the clinic.   BELOW ARE SYMPTOMS THAT SHOULD BE REPORTED IMMEDIATELY:  *FEVER GREATER THAN 100.5 F  *CHILLS WITH OR WITHOUT FEVER  NAUSEA AND VOMITING THAT IS NOT CONTROLLED WITH YOUR NAUSEA MEDICATION  *UNUSUAL SHORTNESS OF BREATH  *UNUSUAL BRUISING OR BLEEDING  TENDERNESS IN MOUTH AND THROAT WITH OR WITHOUT PRESENCE OF ULCERS  *URINARY PROBLEMS  *BOWEL PROBLEMS  UNUSUAL RASH Items with * indicate a potential emergency and should be followed up as soon as possible.  Feel free to call the clinic should you have any questions or concerns. The clinic phone number is [phone].  Please show the CHEMO ALERT CARD at check-in to the Emergency Department and triage nurse.   

## 2018-11-04 LAB — TOTAL PROTEIN, URINE DIPSTICK: Protein, ur: NEGATIVE mg/dL

## 2018-11-12 ENCOUNTER — Inpatient Hospital Stay (HOSPITAL_BASED_OUTPATIENT_CLINIC_OR_DEPARTMENT_OTHER): Payer: Medicaid Other | Admitting: Nurse Practitioner

## 2018-11-12 ENCOUNTER — Inpatient Hospital Stay: Payer: Medicaid Other

## 2018-11-12 ENCOUNTER — Other Ambulatory Visit: Payer: Self-pay | Admitting: Hematology

## 2018-11-12 ENCOUNTER — Encounter: Payer: Self-pay | Admitting: Nurse Practitioner

## 2018-11-12 VITALS — BP 146/84 | HR 81 | Temp 97.6°F | Resp 18 | Ht 66.0 in | Wt 164.1 lb

## 2018-11-12 DIAGNOSIS — Z79899 Other long term (current) drug therapy: Secondary | ICD-10-CM

## 2018-11-12 DIAGNOSIS — Z5112 Encounter for antineoplastic immunotherapy: Secondary | ICD-10-CM | POA: Diagnosis not present

## 2018-11-12 DIAGNOSIS — C771 Secondary and unspecified malignant neoplasm of intrathoracic lymph nodes: Secondary | ICD-10-CM

## 2018-11-12 DIAGNOSIS — G893 Neoplasm related pain (acute) (chronic): Secondary | ICD-10-CM | POA: Diagnosis not present

## 2018-11-12 DIAGNOSIS — I10 Essential (primary) hypertension: Secondary | ICD-10-CM

## 2018-11-12 DIAGNOSIS — E119 Type 2 diabetes mellitus without complications: Secondary | ICD-10-CM

## 2018-11-12 DIAGNOSIS — C160 Malignant neoplasm of cardia: Secondary | ICD-10-CM

## 2018-11-12 DIAGNOSIS — M109 Gout, unspecified: Secondary | ICD-10-CM

## 2018-11-12 LAB — CBC WITH DIFFERENTIAL (CANCER CENTER ONLY)
Abs Immature Granulocytes: 0.02 10*3/uL (ref 0.00–0.07)
Basophils Absolute: 0.1 10*3/uL (ref 0.0–0.1)
Basophils Relative: 1 %
Eosinophils Absolute: 0.1 10*3/uL (ref 0.0–0.5)
Eosinophils Relative: 1 %
HCT: 37 % — ABNORMAL LOW (ref 39.0–52.0)
Hemoglobin: 11.8 g/dL — ABNORMAL LOW (ref 13.0–17.0)
Immature Granulocytes: 0 %
Lymphocytes Relative: 28 %
Lymphs Abs: 1.6 10*3/uL (ref 0.7–4.0)
MCH: 28.7 pg (ref 26.0–34.0)
MCHC: 31.9 g/dL (ref 30.0–36.0)
MCV: 90 fL (ref 80.0–100.0)
Monocytes Absolute: 0.8 10*3/uL (ref 0.1–1.0)
Monocytes Relative: 14 %
NRBC: 0 % (ref 0.0–0.2)
Neutro Abs: 3.2 10*3/uL (ref 1.7–7.7)
Neutrophils Relative %: 56 %
Platelet Count: 231 10*3/uL (ref 150–400)
RBC: 4.11 MIL/uL — ABNORMAL LOW (ref 4.22–5.81)
RDW: 17.5 % — AB (ref 11.5–15.5)
WBC Count: 5.8 10*3/uL (ref 4.0–10.5)

## 2018-11-12 LAB — CMP (CANCER CENTER ONLY)
ALT: 21 U/L (ref 0–44)
AST: 18 U/L (ref 15–41)
Albumin: 3.9 g/dL (ref 3.5–5.0)
Alkaline Phosphatase: 80 U/L (ref 38–126)
Anion gap: 10 (ref 5–15)
BUN: 11 mg/dL (ref 6–20)
CALCIUM: 9.8 mg/dL (ref 8.9–10.3)
CO2: 24 mmol/L (ref 22–32)
Chloride: 110 mmol/L (ref 98–111)
Creatinine: 0.82 mg/dL (ref 0.61–1.24)
Glucose, Bld: 131 mg/dL — ABNORMAL HIGH (ref 70–99)
Potassium: 4.1 mmol/L (ref 3.5–5.1)
Sodium: 144 mmol/L (ref 135–145)
Total Bilirubin: 0.5 mg/dL (ref 0.3–1.2)
Total Protein: 7.2 g/dL (ref 6.5–8.1)

## 2018-11-12 MED ORDER — SODIUM CHLORIDE 0.9% FLUSH
10.0000 mL | INTRAVENOUS | Status: DC | PRN
  Administered 2018-11-12: 10 mL
  Filled 2018-11-12: qty 10

## 2018-11-12 MED ORDER — ACETAMINOPHEN 325 MG PO TABS
650.0000 mg | ORAL_TABLET | Freq: Once | ORAL | Status: AC
  Administered 2018-11-12: 650 mg via ORAL

## 2018-11-12 MED ORDER — SODIUM CHLORIDE 0.9 % IV SOLN
8.0000 mg/kg | Freq: Once | INTRAVENOUS | Status: AC
  Administered 2018-11-12: 600 mg via INTRAVENOUS
  Filled 2018-11-12: qty 50

## 2018-11-12 MED ORDER — SODIUM CHLORIDE 0.9 % IV SOLN
Freq: Once | INTRAVENOUS | Status: AC
  Administered 2018-11-12: 10:00:00 via INTRAVENOUS
  Filled 2018-11-12: qty 250

## 2018-11-12 MED ORDER — ACETAMINOPHEN 325 MG PO TABS
ORAL_TABLET | ORAL | Status: AC
  Filled 2018-11-12: qty 2

## 2018-11-12 MED ORDER — SODIUM CHLORIDE 0.9% FLUSH
10.0000 mL | INTRAVENOUS | Status: DC | PRN
  Administered 2018-11-12: 10 mL via INTRAVENOUS
  Filled 2018-11-12: qty 10

## 2018-11-12 MED ORDER — HEPARIN SOD (PORK) LOCK FLUSH 100 UNIT/ML IV SOLN
500.0000 [IU] | Freq: Once | INTRAVENOUS | Status: AC | PRN
  Administered 2018-11-12: 500 [IU]
  Filled 2018-11-12: qty 5

## 2018-11-12 MED ORDER — DIPHENHYDRAMINE HCL 50 MG/ML IJ SOLN
25.0000 mg | Freq: Once | INTRAMUSCULAR | Status: AC
  Administered 2018-11-12: 25 mg via INTRAVENOUS

## 2018-11-12 MED ORDER — DIPHENHYDRAMINE HCL 50 MG/ML IJ SOLN
INTRAMUSCULAR | Status: AC
  Filled 2018-11-12: qty 1

## 2018-11-12 NOTE — Progress Notes (Addendum)
[facility] OFFICE PROGRESS NOTE   Diagnosis: Gastric cancer  INTERVAL HISTORY:   Robert Beck returns as scheduled.  He completed cycle 9 Taxol/ramucirumab on 10/29/2018.  He had more nausea with the most recent cycle of Taxol/ramucirumab.  Bowels are moving.  He notes increased neuropathy symptoms in the hands and feet.  The numbness in his feet is "painful".  He is noting balance issues.  He is more fatigued.  He has intermittent shortness of breath.  No cough or fever.  Objective:  Vital signs in last 24 hours:  Blood pressure (!) 146/84, pulse 81, temperature 97.6 F (36.4 C), temperature source Oral, resp. rate 18, height '5\' 6"'  (1.676 m), weight 164 lb 1.6 oz (74.4 kg), SpO2 100 %.    HEENT: No thrush or ulcers. Lymphatics: No palpable cervical or supraclavicular lymph nodes. Resp: Lungs clear bilaterally.  No wheezes or rales. Cardio: Regular rate and rhythm. GI: Abdomen soft and nontender.  No hepatomegaly. Vascular: No leg edema. Port-A-Cath without erythema.  Lab Results:  Lab Results  Component Value Date   WBC 5.8 11/12/2018   HGB 11.8 (L) 11/12/2018   HCT 37.0 (L) 11/12/2018   MCV 90.0 11/12/2018   PLT 231 11/12/2018   NEUTROABS 3.2 11/12/2018    Imaging:  No results found.  Medications: I have reviewed the patient's current medications.  Assessment/Plan: 1. Gastric cancer, gastric cardia mass extendingto the GE junction confirmed on endoscopy 04/11/2016, biopsy confirmed adenocarcinoma  Staging CTs of the chest, abdomen, and pelvis 04/09/2016-gastric cardia mass, mediastinal, right hilar, and right supraclavicular lymphadenopathy  Biopsy of a right supraclavicular lymph node on 04/20/2016 revealed metastatic adenocarcinoma  FoundationACT 02/20/2017-TP53 alteration identified  Cycle 1 FOLFOX 04/30/2016 (oxaliplatin given 04/30/2016, 5-FU infusion started 05/02/2016)  Cycle 2 FOLFOX 05/14/2016  Cycle 3 FOLFOX 05/30/2016  Cycle 4  FOLFOX 06/13/2016  Cycle 5 FOLFOX 06/27/2016  Cycle 6 FOLFOX 07/11/2016  CTs 07/20/2016-decrease in supraclavicular, mediastinal, and gastrohepatic adenopathy. Decreased gastric cardia wall thickening.  Cycle 7 FOLFOX 07/25/2016  Cycle 8 FOLFOX 08/08/2016  Cycle 9 FOLFOX 08/22/2016 (oxaliplatin held due to neuropathy)  Cycle 10 FOLFOX 09/05/2016 (oxaliplatin held)  Cycle 11 FOLFOX 09/26/2016 (oxaliplatin held)  Cycle 12 FOLFOX 10/10/2016 (oxaliplatin held)  CT 10/18/2016-mild enlargement of mediastinal lymph nodes, no other evidence of disease progression, stable thickening at the GE junction and stable gastrohepatic lymphadenopathy  Cycle 13 FOLFOX 10/24/2016 (oxaliplatin held)  Maintenance Xeloda 7 days on/7 days off beginning 11/17/2016  Restaging chest CT 01/22/2017-interval progression of metastatic lymphadenopathy in the mediastinum and gastrohepatic ligament.  Salvage therapy with FOLFIRI initiated 02/20/2017  Cycle 5 FOLFIRI 04/17/2017  Restaging chest CT 04/29/2017-mild improvement in mediastinal adenopathy. No significant change in adenopathy in the gastrohepatic ligament. No evidence of disease progression.  Cycle 6 FOLFIRI 05/01/2017  Cycle 7 FOLFIRI 05/15/2017  Cycle 8 FOLFIRI 05/29/2017  Cycle 9 FOLFIRI 06/19/2017  Cycle 10 FOLFIRI 07/03/2017  Cycle 11 FOLFIRI 07/17/2017  CT chest 07/30/2017-mild decrease in mediastinal and gastrohepatic ligament lymphadenopathy, no evidence of progressive disease  Cycle 12 FOLFIRI 07/31/2017  Cycle 13 FOLFIRI 08/14/2017  Cycle 14 FOLFIRI 09/04/2017  Cycle 15 FOLFIRI 09/25/2017  Cycle 16 FOLFIRI 10/16/2017  Cycle 17 FOLFIRI 11/06/2017  Cycle 18 FOLFIRI 11/27/2017  cycle 19 FOLFIRI 12/18/2017  CT 01/06/2018-stable neck/mediastinal nodes, slight enlargement of gastrohepatic ligament node stable periportal and portacaval nodes  Cycle 20 FOLFIRI 01/08/2018  Cycle 21 FOLFIRI 01/29/2018  CT neck 02/06/2018- 4 cm  necrotic right levelIII/IVlymph node similar to the  chest CT 01/06/2018, considerably larger than on 07/30/2017. Unchanged adjacent right level IV/supraclavicular lymph node and partially visualized mediastinal lymph nodes.  Cycle 1 Taxol/ramucirumab 03/05/2018  Core biopsy right cervical lymph node 02/28/2018-metastatic adenocarcinoma, HER-2 negative PDL 1 score-60, MSI-stable, tumor mutational burden-10  Cycle 2 Taxol/ramucirumab7/06/2018  Cycle 3 Taxol/ramucirumab 04/30/2018  CTs 05/23/2018- decrease in mediastinal and gastrohepatic ligament adenopathy, decreased right sided level 3/4 node  Cycle 4 Taxol/ramucirumab 05/28/2018  Cycle 5 Taxol/ramucirumab 06/24/2018 (day 1 ramucirumab held secondary to right eye vision loss)  Cycle 6 Taxol/ramucirumab 07/23/2018  Cycle 7 Taxol/ramucirumab 08/19/2018  Cycle 8 Taxol/ramucirumab 09/15/2018  Restaging CTs 10/14/2018-stable mediastinal adenopathy, stable disease in the neck.  Cycle 9 Taxol/ramucirumab 10/15/2018  Cycle 10 ramucirumab 11/12/2018 (Taxol held due to progressive neuropathy symptoms)  2. Anemia secondary to #1-improved  3. Diabetes  4. Gout  5. Hypertension  6. Hyperlipidemia  7. History of a T7 compression fracture  8. Pain secondary to the gastric mass and mediastinal lymphadenopathy-resolved  9. Port-A-Cath placement 04/20/2016  10. 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 1 mm obstructing stone distal right ureter/right ureteral vesicle junction on CT 09/13/2016.  13. 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 Hypercalcemia 12/19/2016-asymptomatic  15. Nausea following FOLFIRI-emend added beginning with cycle 3, trial of Reglan beginning12/08/2017, persistent  16.Erythematous, mildly  indurated rash right upper posterior leg in setting of possible recent tick bitestatus post course of doxycycline.Resolved.  17.Sore throat/left buccal ulcer following cycle 1 Taxol/ramucirumab  18.Right eye vision loss- etiology unclear, potentially related to diabetes-evaluated by ophthalmology  19.Left tongue ulcer during cycle 7 Taxol/ramucirumab   Disposition: Robert Beck appears stable.  He has completed 9 cycles of Taxol/ramucirumab.  He has increased neuropathy symptoms.  He is experiencing balance issues, pain in the feet.  We decided to place Taxol on hold.  He will receive ramucirumab alone today.  He will return for lab, follow-up, possible treatment in 2 weeks.  He will contact the office in the interim with any problems.  Plan discussed with Dr. Burr Medico.    Ned Card ANP/GNP-BC   11/12/2018  9:28 AM

## 2018-11-12 NOTE — Patient Instructions (Signed)
[facility] Discharge Instructions for Patients Receiving Chemotherapy  Today you received the following chemotherapy agents Cyramza   To help prevent nausea and vomiting after your treatment, we encourage you to take your nausea medication as directed.    If you develop nausea and vomiting that is not controlled by your nausea medication, call the clinic.   BELOW ARE SYMPTOMS THAT SHOULD BE REPORTED IMMEDIATELY:  *FEVER GREATER THAN 100.5 F  *CHILLS WITH OR WITHOUT FEVER  NAUSEA AND VOMITING THAT IS NOT CONTROLLED WITH YOUR NAUSEA MEDICATION  *UNUSUAL SHORTNESS OF BREATH  *UNUSUAL BRUISING OR BLEEDING  TENDERNESS IN MOUTH AND THROAT WITH OR WITHOUT PRESENCE OF ULCERS  *URINARY PROBLEMS  *BOWEL PROBLEMS  UNUSUAL RASH Items with * indicate a potential emergency and should be followed up as soon as possible.  Feel free to call the clinic should you have any questions or concerns. The clinic phone number is [phone].  Please show the CHEMO ALERT CARD at check-in to the Emergency Department and triage nurse.   

## 2018-11-18 ENCOUNTER — Telehealth: Payer: Self-pay | Admitting: Nurse Practitioner

## 2018-11-18 NOTE — Telephone Encounter (Signed)
Called patient to inform 03/04 infusion approved. Spoke with girlfriend, confirmed new appt times. Did not want a mailed confirmation

## 2018-11-19 ENCOUNTER — Ambulatory Visit: Payer: Medicaid Other

## 2018-11-19 ENCOUNTER — Other Ambulatory Visit: Payer: Medicaid Other

## 2018-11-19 ENCOUNTER — Ambulatory Visit: Payer: Medicaid Other | Admitting: Nurse Practitioner

## 2018-11-26 ENCOUNTER — Other Ambulatory Visit: Payer: Medicaid Other

## 2018-11-26 ENCOUNTER — Inpatient Hospital Stay: Payer: Medicaid Other

## 2018-11-26 ENCOUNTER — Telehealth: Payer: Self-pay | Admitting: Nurse Practitioner

## 2018-11-26 ENCOUNTER — Inpatient Hospital Stay (HOSPITAL_BASED_OUTPATIENT_CLINIC_OR_DEPARTMENT_OTHER): Payer: Medicaid Other | Admitting: Nurse Practitioner

## 2018-11-26 ENCOUNTER — Inpatient Hospital Stay: Payer: Medicaid Other | Attending: Oncology

## 2018-11-26 ENCOUNTER — Other Ambulatory Visit: Payer: Self-pay

## 2018-11-26 ENCOUNTER — Encounter: Payer: Self-pay | Admitting: Nurse Practitioner

## 2018-11-26 VITALS — BP 148/82 | HR 78 | Temp 97.6°F | Resp 18 | Ht 66.0 in | Wt 165.0 lb

## 2018-11-26 DIAGNOSIS — C160 Malignant neoplasm of cardia: Secondary | ICD-10-CM | POA: Diagnosis not present

## 2018-11-26 DIAGNOSIS — I10 Essential (primary) hypertension: Secondary | ICD-10-CM

## 2018-11-26 DIAGNOSIS — G620 Drug-induced polyneuropathy: Secondary | ICD-10-CM

## 2018-11-26 DIAGNOSIS — C770 Secondary and unspecified malignant neoplasm of lymph nodes of head, face and neck: Secondary | ICD-10-CM

## 2018-11-26 DIAGNOSIS — E119 Type 2 diabetes mellitus without complications: Secondary | ICD-10-CM

## 2018-11-26 DIAGNOSIS — Z5112 Encounter for antineoplastic immunotherapy: Secondary | ICD-10-CM | POA: Insufficient documentation

## 2018-11-26 DIAGNOSIS — Z95828 Presence of other vascular implants and grafts: Secondary | ICD-10-CM

## 2018-11-26 LAB — COMPREHENSIVE METABOLIC PANEL
ALT: 30 U/L (ref 0–44)
AST: 30 U/L (ref 15–41)
Albumin: 4.3 g/dL (ref 3.5–5.0)
Alkaline Phosphatase: 71 U/L (ref 38–126)
Anion gap: 11 (ref 5–15)
BUN: 11 mg/dL (ref 6–20)
CO2: 22 mmol/L (ref 22–32)
Calcium: 10 mg/dL (ref 8.9–10.3)
Chloride: 107 mmol/L (ref 98–111)
Creatinine, Ser: 0.79 mg/dL (ref 0.61–1.24)
GFR calc Af Amer: >60 mL/min (ref >60)
GFR calc non Af Amer: >60 mL/min (ref >60)
Glucose, Bld: 116 mg/dL — ABNORMAL HIGH (ref 70–99)
POTASSIUM: 4 mmol/L (ref 3.5–5.1)
Sodium: 140 mmol/L (ref 135–145)
Total Bilirubin: 0.5 mg/dL (ref 0.3–1.2)
Total Protein: 7.4 g/dL (ref 6.5–8.1)

## 2018-11-26 LAB — CBC WITH DIFFERENTIAL (CANCER CENTER ONLY)
ABS IMMATURE GRANULOCYTES: 0.03 10*3/uL (ref 0.00–0.07)
Basophils Absolute: 0 10*3/uL (ref 0.0–0.1)
Basophils Relative: 0 %
Eosinophils Absolute: 0.4 10*3/uL (ref 0.0–0.5)
Eosinophils Relative: 4 %
HCT: 40.1 % (ref 39.0–52.0)
Hemoglobin: 13 g/dL (ref 13.0–17.0)
IMMATURE GRANULOCYTES: 0 %
Lymphocytes Relative: 20 %
Lymphs Abs: 2.1 10*3/uL (ref 0.7–4.0)
MCH: 28.5 pg (ref 26.0–34.0)
MCHC: 32.4 g/dL (ref 30.0–36.0)
MCV: 87.9 fL (ref 80.0–100.0)
Monocytes Absolute: 0.7 10*3/uL (ref 0.1–1.0)
Monocytes Relative: 7 %
NEUTROS ABS: 7.1 10*3/uL (ref 1.7–7.7)
NEUTROS PCT: 69 %
NRBC: 0 % (ref 0.0–0.2)
Platelet Count: 198 10*3/uL (ref 150–400)
RBC: 4.56 MIL/uL (ref 4.22–5.81)
RDW: 16.4 % — ABNORMAL HIGH (ref 11.5–15.5)
WBC Count: 10.4 10*3/uL (ref 4.0–10.5)

## 2018-11-26 LAB — TOTAL PROTEIN, URINE DIPSTICK: PROTEIN: NEGATIVE mg/dL

## 2018-11-26 MED ORDER — DIPHENHYDRAMINE HCL 50 MG/ML IJ SOLN
25.0000 mg | Freq: Once | INTRAMUSCULAR | Status: AC
  Administered 2018-11-26: 25 mg via INTRAVENOUS

## 2018-11-26 MED ORDER — SODIUM CHLORIDE 0.9% FLUSH
10.0000 mL | INTRAVENOUS | Status: DC | PRN
  Administered 2018-11-26: 10 mL via INTRAVENOUS
  Filled 2018-11-26: qty 10

## 2018-11-26 MED ORDER — SODIUM CHLORIDE 0.9 % IV SOLN
Freq: Once | INTRAVENOUS | Status: AC
  Administered 2018-11-26: 14:00:00 via INTRAVENOUS
  Filled 2018-11-26: qty 250

## 2018-11-26 MED ORDER — ACETAMINOPHEN 325 MG PO TABS
650.0000 mg | ORAL_TABLET | Freq: Once | ORAL | Status: AC
  Administered 2018-11-26: 650 mg via ORAL

## 2018-11-26 MED ORDER — SODIUM CHLORIDE 0.9 % IV SOLN
8.0000 mg/kg | Freq: Once | INTRAVENOUS | Status: AC
  Administered 2018-11-26: 600 mg via INTRAVENOUS
  Filled 2018-11-26: qty 50

## 2018-11-26 MED ORDER — SODIUM CHLORIDE 0.9% FLUSH
10.0000 mL | INTRAVENOUS | Status: DC | PRN
  Administered 2018-11-26: 10 mL
  Filled 2018-11-26: qty 10

## 2018-11-26 MED ORDER — ACETAMINOPHEN 325 MG PO TABS
ORAL_TABLET | ORAL | Status: AC
  Filled 2018-11-26: qty 2

## 2018-11-26 MED ORDER — HEPARIN SOD (PORK) LOCK FLUSH 100 UNIT/ML IV SOLN
500.0000 [IU] | Freq: Once | INTRAVENOUS | Status: AC | PRN
  Administered 2018-11-26: 500 [IU]
  Filled 2018-11-26: qty 5

## 2018-11-26 MED ORDER — DIPHENHYDRAMINE HCL 50 MG/ML IJ SOLN
INTRAMUSCULAR | Status: AC
  Filled 2018-11-26: qty 1

## 2018-11-26 NOTE — Patient Instructions (Signed)
[facility] Discharge Instructions for Patients Receiving Chemotherapy  Today you received the following chemotherapy agents Cyramza   To help prevent nausea and vomiting after your treatment, we encourage you to take your nausea medication as directed.    If you develop nausea and vomiting that is not controlled by your nausea medication, call the clinic.   BELOW ARE SYMPTOMS THAT SHOULD BE REPORTED IMMEDIATELY:  *FEVER GREATER THAN 100.5 F  *CHILLS WITH OR WITHOUT FEVER  NAUSEA AND VOMITING THAT IS NOT CONTROLLED WITH YOUR NAUSEA MEDICATION  *UNUSUAL SHORTNESS OF BREATH  *UNUSUAL BRUISING OR BLEEDING  TENDERNESS IN MOUTH AND THROAT WITH OR WITHOUT PRESENCE OF ULCERS  *URINARY PROBLEMS  *BOWEL PROBLEMS  UNUSUAL RASH Items with * indicate a potential emergency and should be followed up as soon as possible.  Feel free to call the clinic should you have any questions or concerns. The clinic phone number is [phone].  Please show the CHEMO ALERT CARD at check-in to the Emergency Department and triage nurse.   

## 2018-11-26 NOTE — Progress Notes (Signed)
OK to treat with CMT from 11/12/2018 per Ned Card, NP per MD [facility]

## 2018-11-26 NOTE — Telephone Encounter (Signed)
Gave avs and calendar ° °

## 2018-11-26 NOTE — Progress Notes (Addendum)
[facility] OFFICE PROGRESS NOTE   Diagnosis: Gastric cancer  INTERVAL HISTORY:   Robert Beck returns as scheduled.  He has completed 9 cycles of Taxol/ramucirumab.  At the time of his last visit 11/12/2018 he reported worsening neuropathy symptoms.  We placed the Taxol on hold and he received ramucirumab alone.  The neuropathy symptoms are stable to slightly worse.  He continues to note balance issues.  He has a "callus" on the right foot which is painful.  Periodic nausea.  No mouth sores.  No diarrhea.  He continues to have intermittent shortness of breath.  No cough or fever.  No leg swelling or calf pain.  Objective:  Vital signs in last 24 hours:  Blood pressure (!) 148/82, pulse 78, temperature 97.6 F (36.4 C), temperature source Oral, resp. rate 18, height '5\' 6"'  (1.676 m), weight 165 lb (74.8 kg), SpO2 98 %.    HEENT: No thrush or ulcers. Lymphatics: No palpable cervical or supraclavicular lymph nodes. Resp: Lungs clear bilaterally. Cardio: Regular rate and rhythm. GI: Abdomen soft and nontender.  No hepatomegaly. Vascular: No leg edema.  Calves soft and nontender. Skin: Small area of skin thickening upper aspect sole on the right foot.  No erythema. Port-A-Cath without erythema.   Lab Results:  Lab Results  Component Value Date   WBC 10.4 11/26/2018   HGB 13.0 11/26/2018   HCT 40.1 11/26/2018   MCV 87.9 11/26/2018   PLT 198 11/26/2018   NEUTROABS 7.1 11/26/2018    Imaging:  No results found.  Medications: I have reviewed the patient's current medications.  Assessment/Plan: 1. Gastric cancer, gastric cardia mass extendingto the GE junction confirmed on endoscopy 04/11/2016, biopsy confirmed adenocarcinoma  Staging CTs of the chest, abdomen, and pelvis 04/09/2016-gastric cardia mass, mediastinal, right hilar, and right supraclavicular lymphadenopathy  Biopsy of a right supraclavicular lymph node on 04/20/2016 revealed metastatic  adenocarcinoma  FoundationACT 02/20/2017-TP53 alteration identified  Cycle 1 FOLFOX 04/30/2016 (oxaliplatin given 04/30/2016, 5-FU infusion started 05/02/2016)  Cycle 2 FOLFOX 05/14/2016  Cycle 3 FOLFOX 05/30/2016  Cycle 4 FOLFOX 06/13/2016  Cycle 5 FOLFOX 06/27/2016  Cycle 6 FOLFOX 07/11/2016  CTs 07/20/2016-decrease in supraclavicular, mediastinal, and gastrohepatic adenopathy. Decreased gastric cardia wall thickening.  Cycle 7 FOLFOX 07/25/2016  Cycle 8 FOLFOX 08/08/2016  Cycle 9 FOLFOX 08/22/2016 (oxaliplatin held due to neuropathy)  Cycle 10 FOLFOX 09/05/2016 (oxaliplatin held)  Cycle 11 FOLFOX 09/26/2016 (oxaliplatin held)  Cycle 12 FOLFOX 10/10/2016 (oxaliplatin held)  CT 10/18/2016-mild enlargement of mediastinal lymph nodes, no other evidence of disease progression, stable thickening at the GE junction and stable gastrohepatic lymphadenopathy  Cycle 13 FOLFOX 10/24/2016 (oxaliplatin held)  Maintenance Xeloda 7 days on/7 days off beginning 11/17/2016  Restaging chest CT 01/22/2017-interval progression of metastatic lymphadenopathy in the mediastinum and gastrohepatic ligament.  Salvage therapy with FOLFIRI initiated 02/20/2017  Cycle 5 FOLFIRI 04/17/2017  Restaging chest CT 04/29/2017-mild improvement in mediastinal adenopathy. No significant change in adenopathy in the gastrohepatic ligament. No evidence of disease progression.  Cycle 6 FOLFIRI 05/01/2017  Cycle 7 FOLFIRI 05/15/2017  Cycle 8 FOLFIRI 05/29/2017  Cycle 9 FOLFIRI 06/19/2017  Cycle 10 FOLFIRI 07/03/2017  Cycle 11 FOLFIRI 07/17/2017  CT chest 07/30/2017-mild decrease in mediastinal and gastrohepatic ligament lymphadenopathy, no evidence of progressive disease  Cycle 12 FOLFIRI 07/31/2017  Cycle 13 FOLFIRI 08/14/2017  Cycle 14 FOLFIRI 09/04/2017  Cycle 15 FOLFIRI 09/25/2017  Cycle 16 FOLFIRI 10/16/2017  Cycle 17 FOLFIRI 11/06/2017  Cycle 18 FOLFIRI 11/27/2017  cycle 19 FOLFIRI  12/18/2017  CT 01/06/2018-stable neck/mediastinal nodes, slight enlargement of gastrohepatic ligament node stable periportal and portacaval nodes  Cycle 20 FOLFIRI 01/08/2018  Cycle 21 FOLFIRI 01/29/2018  CT neck 02/06/2018- 4 cm necrotic right levelIII/IVlymph node similar to the chest CT 01/06/2018, considerably larger than on 07/30/2017. Unchanged adjacent right level IV/supraclavicular lymph node and partially visualized mediastinal lymph nodes.  Cycle 1 Taxol/ramucirumab 03/05/2018  Core biopsy right cervical lymph node 02/28/2018-metastatic adenocarcinoma, HER-2 negative PDL 1 score-60, MSI-stable, tumor mutational burden-10  Cycle 2 Taxol/ramucirumab7/06/2018  Cycle 3 Taxol/ramucirumab 04/30/2018  CTs 05/23/2018- decrease in mediastinal and gastrohepatic ligament adenopathy, decreased right sided level 3/4 node  Cycle 4 Taxol/ramucirumab 05/28/2018  Cycle 5 Taxol/ramucirumab 06/24/2018 (day 1 ramucirumab held secondary to right eye vision loss)  Cycle 6 Taxol/ramucirumab 07/23/2018  Cycle 7 Taxol/ramucirumab 08/19/2018  Cycle 8Taxol/ramucirumab 09/15/2018  Restaging CTs 10/14/2018-stable mediastinal adenopathy, stable disease in the neck.  Cycle 9 Taxol/ramucirumab 10/15/2018  Cycle 10 ramucirumab 11/12/2018 (Taxol held due to progressive neuropathy symptoms)  Taxol discontinued due to neuropathy, ramucirumab every 2 weeks 11/26/2018  2. Anemia secondary to #1-improved  3. Diabetes  4. Gout  5. Hypertension  6. Hyperlipidemia  7. History of a T7 compression fracture  8. Pain secondary to the gastric mass and mediastinal lymphadenopathy-resolved  9. Port-A-Cath placement 04/20/2016  10. 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 1 mm obstructing stone distal right ureter/right ureteral vesicle junction on CT 09/13/2016.  13. Hoarseness  12/19/2016-potentially related to mediastinal adenopathy-left vocal cord paralysis confirmed on ENT exam 12/27/2016-improved  14. Hypercalcemia 12/19/2016-asymptomatic  15. Nausea following FOLFIRI-emend added beginning with cycle 3, trial of Reglan beginning12/08/2017, persistent  16.Erythematous, mildly indurated rash right upper posterior leg in setting of possible recent tick bitestatus post course of doxycycline.Resolved.  17.Sore throat/left buccal ulcer following cycle 1 Taxol/ramucirumab  18.Right eye vision loss- etiology unclear, potentially related to diabetes-evaluated by ophthalmology  19.Left tongue ulcer during cycle 7 Taxol/ramucirumab    Disposition: Robert Beck appears unchanged.  There is no clinical evidence of disease progression.  Taxol was placed on hold 2 weeks ago due to worsening neuropathy symptoms.  The neuropathy symptoms have not improved.  Taxol is being discontinued.  He will continue ramucirumab every 2 weeks.  He will return for lab, follow-up and ramucirumab in 2 weeks.  He will contact the office in the interim with any problems.  Patient seen with Dr. Benay Spice.    Ned Card ANP/GNP-BC   11/26/2018  12:19 PM  This was a shared visit with Ned Card.  Robert Beck has developed progressive neuropathy symptoms.  Taxol will be placed on hold.  I recommended he follow-up with ophthalmology for evaluation of decreased visual acuity in the right eye.  He appears to have a callus at the sole of the right foot.  Julieanne Manson, MD

## 2018-12-09 ENCOUNTER — Other Ambulatory Visit: Payer: Self-pay

## 2018-12-09 ENCOUNTER — Inpatient Hospital Stay (HOSPITAL_BASED_OUTPATIENT_CLINIC_OR_DEPARTMENT_OTHER): Payer: Medicaid Other | Admitting: Nurse Practitioner

## 2018-12-09 ENCOUNTER — Inpatient Hospital Stay: Payer: Medicaid Other

## 2018-12-09 ENCOUNTER — Encounter: Payer: Self-pay | Admitting: Nurse Practitioner

## 2018-12-09 ENCOUNTER — Telehealth: Payer: Self-pay | Admitting: Nurse Practitioner

## 2018-12-09 VITALS — BP 142/80 | HR 76 | Temp 97.8°F | Resp 18 | Ht 66.0 in | Wt 160.4 lb

## 2018-12-09 DIAGNOSIS — Z5112 Encounter for antineoplastic immunotherapy: Secondary | ICD-10-CM | POA: Diagnosis not present

## 2018-12-09 DIAGNOSIS — C778 Secondary and unspecified malignant neoplasm of lymph nodes of multiple regions: Secondary | ICD-10-CM

## 2018-12-09 DIAGNOSIS — G620 Drug-induced polyneuropathy: Secondary | ICD-10-CM

## 2018-12-09 DIAGNOSIS — C160 Malignant neoplasm of cardia: Secondary | ICD-10-CM

## 2018-12-09 DIAGNOSIS — R6889 Other general symptoms and signs: Secondary | ICD-10-CM | POA: Diagnosis not present

## 2018-12-09 DIAGNOSIS — R110 Nausea: Secondary | ICD-10-CM

## 2018-12-09 DIAGNOSIS — E119 Type 2 diabetes mellitus without complications: Secondary | ICD-10-CM

## 2018-12-09 DIAGNOSIS — I10 Essential (primary) hypertension: Secondary | ICD-10-CM

## 2018-12-09 DIAGNOSIS — Z95828 Presence of other vascular implants and grafts: Secondary | ICD-10-CM

## 2018-12-09 LAB — CMP (CANCER CENTER ONLY)
ALT: 22 U/L (ref 0–44)
AST: 21 U/L (ref 15–41)
Albumin: 4.1 g/dL (ref 3.5–5.0)
Alkaline Phosphatase: 73 U/L (ref 38–126)
Anion gap: 12 (ref 5–15)
BUN: 14 mg/dL (ref 6–20)
CO2: 21 mmol/L — AB (ref 22–32)
Calcium: 9.5 mg/dL (ref 8.9–10.3)
Chloride: 111 mmol/L (ref 98–111)
Creatinine: 0.84 mg/dL (ref 0.61–1.24)
GFR, Estimated: >60 mL/min (ref >60)
Glucose, Bld: 77 mg/dL (ref 70–99)
Potassium: 3.8 mmol/L (ref 3.5–5.1)
Sodium: 144 mmol/L (ref 135–145)
Total Bilirubin: 0.6 mg/dL (ref 0.3–1.2)
Total Protein: 7.8 g/dL (ref 6.5–8.1)

## 2018-12-09 LAB — CBC WITH DIFFERENTIAL (CANCER CENTER ONLY)
Abs Immature Granulocytes: 0.01 10*3/uL (ref 0.00–0.07)
Basophils Absolute: 0.1 10*3/uL (ref 0.0–0.1)
Basophils Relative: 1 %
Eosinophils Absolute: 0.5 10*3/uL (ref 0.0–0.5)
Eosinophils Relative: 5 %
HCT: 43.3 % (ref 39.0–52.0)
Hemoglobin: 13.8 g/dL (ref 13.0–17.0)
Immature Granulocytes: 0 %
Lymphocytes Relative: 22 %
Lymphs Abs: 2 10*3/uL (ref 0.7–4.0)
MCH: 28.2 pg (ref 26.0–34.0)
MCHC: 31.9 g/dL (ref 30.0–36.0)
MCV: 88.5 fL (ref 80.0–100.0)
MONO ABS: 0.7 10*3/uL (ref 0.1–1.0)
Monocytes Relative: 8 %
Neutro Abs: 6.1 10*3/uL (ref 1.7–7.7)
Neutrophils Relative %: 64 %
Platelet Count: 231 10*3/uL (ref 150–400)
RBC: 4.89 MIL/uL (ref 4.22–5.81)
RDW: 15.8 % — ABNORMAL HIGH (ref 11.5–15.5)
WBC Count: 9.4 10*3/uL (ref 4.0–10.5)
nRBC: 0 % (ref 0.0–0.2)

## 2018-12-09 MED ORDER — HEPARIN SOD (PORK) LOCK FLUSH 100 UNIT/ML IV SOLN
500.0000 [IU] | Freq: Once | INTRAVENOUS | Status: AC | PRN
  Administered 2018-12-09: 500 [IU]
  Filled 2018-12-09: qty 5

## 2018-12-09 MED ORDER — ACETAMINOPHEN 325 MG PO TABS
ORAL_TABLET | ORAL | Status: AC
  Filled 2018-12-09: qty 2

## 2018-12-09 MED ORDER — ACETAMINOPHEN 325 MG PO TABS
650.0000 mg | ORAL_TABLET | Freq: Once | ORAL | Status: AC
  Administered 2018-12-09: 650 mg via ORAL

## 2018-12-09 MED ORDER — DIPHENHYDRAMINE HCL 50 MG/ML IJ SOLN
INTRAMUSCULAR | Status: AC
  Filled 2018-12-09: qty 1

## 2018-12-09 MED ORDER — SODIUM CHLORIDE 0.9% FLUSH
10.0000 mL | INTRAVENOUS | Status: DC | PRN
  Administered 2018-12-09: 10 mL via INTRAVENOUS
  Filled 2018-12-09: qty 10

## 2018-12-09 MED ORDER — DIPHENHYDRAMINE HCL 25 MG PO CAPS
ORAL_CAPSULE | ORAL | Status: AC
  Filled 2018-12-09: qty 1

## 2018-12-09 MED ORDER — SODIUM CHLORIDE 0.9% FLUSH
10.0000 mL | INTRAVENOUS | Status: DC | PRN
  Administered 2018-12-09: 10 mL
  Filled 2018-12-09: qty 10

## 2018-12-09 MED ORDER — SODIUM CHLORIDE 0.9 % IV SOLN
Freq: Once | INTRAVENOUS | Status: AC
  Administered 2018-12-09: 12:00:00 via INTRAVENOUS
  Filled 2018-12-09: qty 250

## 2018-12-09 MED ORDER — SODIUM CHLORIDE 0.9 % IV SOLN
8.0000 mg/kg | Freq: Once | INTRAVENOUS | Status: AC
  Administered 2018-12-09: 600 mg via INTRAVENOUS
  Filled 2018-12-09: qty 50

## 2018-12-09 MED ORDER — DIPHENHYDRAMINE HCL 50 MG/ML IJ SOLN
25.0000 mg | Freq: Once | INTRAMUSCULAR | Status: AC
  Administered 2018-12-09: 25 mg via INTRAVENOUS

## 2018-12-09 NOTE — Progress Notes (Addendum)
[facility] OFFICE PROGRESS NOTE   Diagnosis: Gastric cancer  INTERVAL HISTORY:   Mr. Bolger returns as scheduled.  He continues ramucirumab every 2 weeks.  He continues to have intermittent nausea.  Periodic loose stools.  No rash.  Dyspnea is better.  Minimal cough.  No fever.  Neuropathy symptoms unchanged.  Over the past weekend he noted progressive fullness at the right lower neck.  Objective:  Vital signs in last 24 hours:  Blood pressure (!) 142/80, pulse 76, temperature 97.8 F (36.6 C), temperature source Oral, resp. rate 18, height '5\' 6"'  (1.676 m), weight 160 lb 6.4 oz (72.8 kg), SpO2 99 %.    HEENT: No thrush or ulcers.  Fullness at the right lower neck without discrete adenopathy. Resp: Lungs clear bilaterally. Cardio: Regular rate and rhythm. GI: Abdomen soft and nontender.  No hepatomegaly. Vascular: No leg edema. Port-A-Cath without erythema.  Lab Results:  Lab Results  Component Value Date   WBC 9.4 12/09/2018   HGB 13.8 12/09/2018   HCT 43.3 12/09/2018   MCV 88.5 12/09/2018   PLT 231 12/09/2018   NEUTROABS 6.1 12/09/2018    Imaging:  No results found.  Medications: I have reviewed the patient's current medications.  Assessment/Plan: 1. 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 Decreased gastric cardia wall thickening.  Cycle 7 FOLFOX 07/25/2016  Cycle 8 FOLFOX 08/08/2016  Cycle 9 FOLFOX 08/22/2016 (oxaliplatin held due to neuropathy)  Cycle 10 FOLFOX 09/05/2016 (oxaliplatin held)  Cycle 11 FOLFOX 09/26/2016 (oxaliplatin held)  Cycle 12 FOLFOX 10/10/2016 (oxaliplatin held)  CT 10/18/2016-mild enlargement of mediastinal lymph nodes, no other evidence of disease progression, stable thickening at the GE junction and stable gastrohepatic lymphadenopathy  Cycle 13 FOLFOX 10/24/2016 (oxaliplatin held)  Maintenance Xeloda 7 days on/7 days off beginning 11/17/2016  Restaging chest CT 01/22/2017-interval progression of metastatic lymphadenopathy in the mediastinum and gastrohepatic ligament.  Salvage therapy with FOLFIRI initiated 02/20/2017  Cycle 5 FOLFIRI 04/17/2017  Restaging chest CT 04/29/2017-mild improvement in mediastinal adenopathy. No significant change in adenopathy in the gastrohepatic ligament. No evidence of disease progression.  Cycle 6 FOLFIRI 05/01/2017  Cycle 7 FOLFIRI 05/15/2017  Cycle 8 FOLFIRI 05/29/2017  Cycle 9 FOLFIRI 06/19/2017  Cycle 10 FOLFIRI 07/03/2017  Cycle 11 FOLFIRI 07/17/2017  CT chest 07/30/2017-mild decrease in mediastinal and gastrohepatic ligament lymphadenopathy, no evidence of progressive disease  Cycle 12 FOLFIRI 07/31/2017  Cycle 13 FOLFIRI 08/14/2017  Cycle 14 FOLFIRI 09/04/2017  Cycle 15 FOLFIRI 09/25/2017  Cycle 16 FOLFIRI 10/16/2017  Cycle 17 FOLFIRI 11/06/2017  Cycle 18 FOLFIRI 11/27/2017  cycle 19 FOLFIRI 12/18/2017  CT 01/06/2018-stable neck/mediastinal nodes, slight enlargement of gastrohepatic ligament node stable periportal and portacaval nodes  Cycle 20 FOLFIRI 01/08/2018  Cycle 21 FOLFIRI 01/29/2018  CT neck 02/06/2018- 4 cm necrotic right levelIII/IVlymph node similar to the chest CT 01/06/2018, considerably larger than on  07/30/2017. Unchanged adjacent right level IV/supraclavicular lymph node and partially visualized mediastinal lymph nodes.  Cycle 1 Taxol/ramucirumab 03/05/2018  Core biopsy right cervical lymph node 02/28/2018-metastatic adenocarcinoma, HER-2 negative PDL 1 score-60, MSI-stable, tumor mutational burden-10  Cycle 2 Taxol/ramucirumab7/06/2018  Cycle 3 Taxol/ramucirumab 04/30/2018  CTs 05/23/2018- decrease in mediastinal and gastrohepatic ligament adenopathy, decreased right sided level 3/4 node  Cycle 4 Taxol/ramucirumab 05/28/2018  Cycle 5 Taxol/ramucirumab 06/24/2018 (day 1 ramucirumab held secondary to right eye vision loss)  Cycle 6 Taxol/ramucirumab 07/23/2018  Cycle 7 Taxol/ramucirumab 08/19/2018  Cycle 8Taxol/ramucirumab 09/15/2018  Restaging CTs 10/14/2018-stable mediastinal adenopathy, stable disease in the neck.  Cycle 9 Taxol/ramucirumab 10/15/2018  Cycle 10 ramucirumab 11/12/2018 (Taxol held due to progressive neuropathy symptoms)  Taxol discontinued due to neuropathy, ramucirumab every 2 weeks 11/26/2018  2. Anemia secondary to #1-improved  3. Diabetes  4. Gout  5. Hypertension  6. Hyperlipidemia  7. History of a T7 compression fracture  8. Pain secondary to the gastric mass and mediastinal lymphadenopathy-resolved  9. Port-A-Cath placement 04/20/2016  10. 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 1 mm obstructing stone distal right ureter/right ureteral vesicle junction on CT 09/13/2016.  13. 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 Hypercalcemia 12/19/2016-asymptomatic  15. Nausea following FOLFIRI-emend added beginning with cycle 3, trial of Reglan beginning12/08/2017, persistent  16.Erythematous, mildly indurated rash right upper  posterior leg in setting of possible recent tick bitestatus post course of doxycycline.Resolved.  17.Sore throat/left buccal ulcer following cycle 1 Taxol/ramucirumab  18.Right eye vision loss- etiology unclear, potentially related to diabetes-evaluated by ophthalmology  19.Left tongue ulcer during cycle 7 Taxol/ramucirumab  Disposition: Mr. Geralds appears stable.  The plan is to continue every 2-week ramucirumab with restaging CTs in approximately 1 month, sooner if there is clinical evidence of disease progression.    We reviewed the labs from today.  Counts are adequate for treatment.  He will return for lab, follow-up and the next cycle of ramucirumab in 2 weeks.  He will contact the office in the interim with any problems.  Patient seen with Dr. Benay Spice.   Ned Card ANP/GNP-BC   12/09/2018  10:52 AM This was a shared visit with Ned Card.  Mr. Lanigan was interviewed and examined.  There is slight asymmetric fullness of the right lower neck without a discrete mass.  The plan is to continue ramucirumab.  He will be scheduled for a restaging CT in approximately 1 month.  Julieanne Manson, MD

## 2018-12-09 NOTE — Telephone Encounter (Signed)
Gave avs and calendar ° °

## 2018-12-09 NOTE — Patient Instructions (Signed)
[facility] Discharge Instructions for Patients Receiving Chemotherapy  Today you received the following chemotherapy agents Cyramza   To help prevent nausea and vomiting after your treatment, we encourage you to take your nausea medication as directed.    If you develop nausea and vomiting that is not controlled by your nausea medication, call the clinic.   BELOW ARE SYMPTOMS THAT SHOULD BE REPORTED IMMEDIATELY:  *FEVER GREATER THAN 100.5 F  *CHILLS WITH OR WITHOUT FEVER  NAUSEA AND VOMITING THAT IS NOT CONTROLLED WITH YOUR NAUSEA MEDICATION  *UNUSUAL SHORTNESS OF BREATH  *UNUSUAL BRUISING OR BLEEDING  TENDERNESS IN MOUTH AND THROAT WITH OR WITHOUT PRESENCE OF ULCERS  *URINARY PROBLEMS  *BOWEL PROBLEMS  UNUSUAL RASH Items with * indicate a potential emergency and should be followed up as soon as possible.  Feel free to call the clinic should you have any questions or concerns. The clinic phone number is [phone].  Please show the CHEMO ALERT CARD at check-in to the Emergency Department and triage nurse.   

## 2018-12-21 ENCOUNTER — Other Ambulatory Visit: Payer: Self-pay | Admitting: Oncology

## 2018-12-24 ENCOUNTER — Inpatient Hospital Stay: Payer: Medicaid Other | Attending: Oncology | Admitting: Nurse Practitioner

## 2018-12-24 ENCOUNTER — Encounter: Payer: Self-pay | Admitting: Nurse Practitioner

## 2018-12-24 ENCOUNTER — Inpatient Hospital Stay: Payer: Medicaid Other

## 2018-12-24 ENCOUNTER — Other Ambulatory Visit: Payer: Self-pay

## 2018-12-24 VITALS — BP 131/97

## 2018-12-24 VITALS — BP 130/82 | HR 100 | Temp 97.6°F | Resp 18 | Wt 157.6 lb

## 2018-12-24 DIAGNOSIS — C778 Secondary and unspecified malignant neoplasm of lymph nodes of multiple regions: Secondary | ICD-10-CM | POA: Diagnosis not present

## 2018-12-24 DIAGNOSIS — R110 Nausea: Secondary | ICD-10-CM

## 2018-12-24 DIAGNOSIS — G620 Drug-induced polyneuropathy: Secondary | ICD-10-CM

## 2018-12-24 DIAGNOSIS — C160 Malignant neoplasm of cardia: Secondary | ICD-10-CM

## 2018-12-24 DIAGNOSIS — E119 Type 2 diabetes mellitus without complications: Secondary | ICD-10-CM

## 2018-12-24 DIAGNOSIS — Z95828 Presence of other vascular implants and grafts: Secondary | ICD-10-CM

## 2018-12-24 DIAGNOSIS — Z79899 Other long term (current) drug therapy: Secondary | ICD-10-CM

## 2018-12-24 DIAGNOSIS — I10 Essential (primary) hypertension: Secondary | ICD-10-CM

## 2018-12-24 DIAGNOSIS — Z5112 Encounter for antineoplastic immunotherapy: Secondary | ICD-10-CM | POA: Insufficient documentation

## 2018-12-24 DIAGNOSIS — R0600 Dyspnea, unspecified: Secondary | ICD-10-CM

## 2018-12-24 LAB — CMP (CANCER CENTER ONLY)
ALT: 20 U/L (ref 0–44)
AST: 21 U/L (ref 15–41)
Albumin: 4.2 g/dL (ref 3.5–5.0)
Alkaline Phosphatase: 67 U/L (ref 38–126)
Anion gap: 11 (ref 5–15)
BUN: 18 mg/dL (ref 6–20)
CO2: 22 mmol/L (ref 22–32)
Calcium: 8.9 mg/dL (ref 8.9–10.3)
Chloride: 108 mmol/L (ref 98–111)
Creatinine: 1.03 mg/dL (ref 0.61–1.24)
GFR, Est AFR Am: >60 mL/min (ref >60)
GFR, Estimated: >60 mL/min (ref >60)
Glucose, Bld: 180 mg/dL — ABNORMAL HIGH (ref 70–99)
Potassium: 3.9 mmol/L (ref 3.5–5.1)
Sodium: 141 mmol/L (ref 135–145)
Total Bilirubin: 0.8 mg/dL (ref 0.3–1.2)
Total Protein: 7.8 g/dL (ref 6.5–8.1)

## 2018-12-24 LAB — CBC WITH DIFFERENTIAL (CANCER CENTER ONLY)
Abs Immature Granulocytes: 0.02 10*3/uL (ref 0.00–0.07)
Basophils Absolute: 0 10*3/uL (ref 0.0–0.1)
Basophils Relative: 0 %
Eosinophils Absolute: 0.2 10*3/uL (ref 0.0–0.5)
Eosinophils Relative: 2 %
HCT: 44.5 % (ref 39.0–52.0)
Hemoglobin: 14.4 g/dL (ref 13.0–17.0)
Immature Granulocytes: 0 %
Lymphocytes Relative: 22 %
Lymphs Abs: 1.7 10*3/uL (ref 0.7–4.0)
MCH: 28.1 pg (ref 26.0–34.0)
MCHC: 32.4 g/dL (ref 30.0–36.0)
MCV: 86.9 fL (ref 80.0–100.0)
Monocytes Absolute: 0.5 10*3/uL (ref 0.1–1.0)
Monocytes Relative: 6 %
Neutro Abs: 5.5 10*3/uL (ref 1.7–7.7)
Neutrophils Relative %: 70 %
Platelet Count: 239 10*3/uL (ref 150–400)
RBC: 5.12 MIL/uL (ref 4.22–5.81)
RDW: 15 % (ref 11.5–15.5)
WBC Count: 7.9 10*3/uL (ref 4.0–10.5)
nRBC: 0 % (ref 0.0–0.2)

## 2018-12-24 LAB — TOTAL PROTEIN, URINE DIPSTICK: Protein, ur: NEGATIVE mg/dL

## 2018-12-24 MED ORDER — SODIUM CHLORIDE 0.9 % IV SOLN
8.0000 mg/kg | Freq: Once | INTRAVENOUS | Status: AC
  Administered 2018-12-24: 600 mg via INTRAVENOUS
  Filled 2018-12-24: qty 50

## 2018-12-24 MED ORDER — DIPHENHYDRAMINE HCL 50 MG/ML IJ SOLN
25.0000 mg | Freq: Once | INTRAMUSCULAR | Status: AC
  Administered 2018-12-24: 25 mg via INTRAVENOUS

## 2018-12-24 MED ORDER — SODIUM CHLORIDE 0.9% FLUSH
10.0000 mL | INTRAVENOUS | Status: DC | PRN
  Administered 2018-12-24: 09:00:00 10 mL via INTRAVENOUS
  Filled 2018-12-24: qty 10

## 2018-12-24 MED ORDER — ACETAMINOPHEN 500 MG PO TABS
ORAL_TABLET | ORAL | Status: AC
  Filled 2018-12-24: qty 2

## 2018-12-24 MED ORDER — HEPARIN SOD (PORK) LOCK FLUSH 100 UNIT/ML IV SOLN
500.0000 [IU] | Freq: Once | INTRAVENOUS | Status: AC | PRN
  Administered 2018-12-24: 13:00:00 500 [IU]
  Filled 2018-12-24: qty 5

## 2018-12-24 MED ORDER — SODIUM CHLORIDE 0.9 % IV SOLN
Freq: Once | INTRAVENOUS | Status: AC
  Administered 2018-12-24: 10:00:00 via INTRAVENOUS
  Filled 2018-12-24: qty 250

## 2018-12-24 MED ORDER — DIPHENHYDRAMINE HCL 50 MG/ML IJ SOLN
INTRAMUSCULAR | Status: AC
  Filled 2018-12-24: qty 1

## 2018-12-24 MED ORDER — SODIUM CHLORIDE 0.9% FLUSH
10.0000 mL | INTRAVENOUS | Status: DC | PRN
  Administered 2018-12-24: 13:00:00 10 mL
  Filled 2018-12-24: qty 10

## 2018-12-24 MED ORDER — ACETAMINOPHEN 325 MG PO TABS
650.0000 mg | ORAL_TABLET | Freq: Once | ORAL | Status: AC
  Administered 2018-12-24: 650 mg via ORAL

## 2018-12-24 MED ORDER — ACETAMINOPHEN 325 MG PO TABS
ORAL_TABLET | ORAL | Status: AC
  Filled 2018-12-24: qty 2

## 2018-12-24 NOTE — Progress Notes (Signed)
[facility] OFFICE PROGRESS NOTE   Diagnosis: Gastric cancer  INTERVAL HISTORY:   [name] returns as scheduled.  He completed another cycle of ramucirumab 12/09/2018.  He has been having more nausea over the past week.  He had "dry heaves" earlier this morning.  He notes no significant change in the right neck fullness.  No diarrhea.  He denies bleeding.  Oral intake yesterday was decreased.  He continues to have mild dyspnea, occasional cough.  No fever.  Neuropathy symptoms are stable.  Objective:  Vital signs in last 24 hours:  Blood pressure 130/82, pulse 100, temperature 97.6 F (36.4 C), temperature source Oral, resp. rate 18, weight 157 lb 9.6 oz (71.5 kg), SpO2 97 %.    HEENT: No thrush or ulcers. Lymphatics: Fullness at the right lower neck, question small lymph node. Resp: Respirations even and unlabored. Cardio: Regular rate and rhythm. Vascular: No leg edema.  Calves soft and nontender. Neuro: Alert and oriented. Port-A-Cath without erythema.  Lab Results:  Lab Results  Component Value Date   WBC 7.9 12/24/2018   HGB 14.4 12/24/2018   HCT 44.5 12/24/2018   MCV 86.9 12/24/2018   PLT 239 12/24/2018   NEUTROABS 5.5 12/24/2018    Imaging:  No results found.  Medications: I have reviewed the patient's current medications.  Assessment/Plan: 1. Gastric cancer, gastric cardia mass extendingto the GE junction confirmed on endoscopy 04/11/2016, biopsy confirmed adenocarcinoma  Staging CTs of the chest, abdomen, and pelvis 04/09/2016-gastric cardia mass, mediastinal, right hilar, and right supraclavicular lymphadenopathy  Biopsy of a right supraclavicular lymph node on 04/20/2016 revealed metastatic adenocarcinoma  FoundationACT 02/20/2017-TP53 alteration identified  Cycle 1 FOLFOX 04/30/2016 (oxaliplatin given 04/30/2016, 5-FU infusion started 05/02/2016)  Cycle 2 FOLFOX 05/14/2016  Cycle 3 FOLFOX 05/30/2016  Cycle 4 FOLFOX 06/13/2016   Cycle 5 FOLFOX 06/27/2016  Cycle 6 FOLFOX 07/11/2016  CTs 07/20/2016-decrease in supraclavicular, mediastinal, and gastrohepatic adenopathy. Decreased gastric cardia wall thickening.  Cycle 7 FOLFOX 07/25/2016  Cycle 8 FOLFOX 08/08/2016  Cycle 9 FOLFOX 08/22/2016 (oxaliplatin held due to neuropathy)  Cycle 10 FOLFOX 09/05/2016 (oxaliplatin held)  Cycle 11 FOLFOX 09/26/2016 (oxaliplatin held)  Cycle 12 FOLFOX 10/10/2016 (oxaliplatin held)  CT 10/18/2016-mild enlargement of mediastinal lymph nodes, no other evidence of disease progression, stable thickening at the GE junction and stable gastrohepatic lymphadenopathy  Cycle 13 FOLFOX 10/24/2016 (oxaliplatin held)  Maintenance Xeloda 7 days on/7 days off beginning 11/17/2016  Restaging chest CT 01/22/2017-interval progression of metastatic lymphadenopathy in the mediastinum and gastrohepatic ligament.  Salvage therapy with FOLFIRI initiated 02/20/2017  Cycle 5 FOLFIRI 04/17/2017  Restaging chest CT 04/29/2017-mild improvement in mediastinal adenopathy. No significant change in adenopathy in the gastrohepatic ligament. No evidence of disease progression.  Cycle 6 FOLFIRI 05/01/2017  Cycle 7 FOLFIRI 05/15/2017  Cycle 8 FOLFIRI 05/29/2017  Cycle 9 FOLFIRI 06/19/2017  Cycle 10 FOLFIRI 07/03/2017  Cycle 11 FOLFIRI 07/17/2017  CT chest 07/30/2017-mild decrease in mediastinal and gastrohepatic ligament lymphadenopathy, no evidence of progressive disease  Cycle 12 FOLFIRI 07/31/2017  Cycle 13 FOLFIRI 08/14/2017  Cycle 14 FOLFIRI 09/04/2017  Cycle 15 FOLFIRI 09/25/2017  Cycle 16 FOLFIRI 10/16/2017  Cycle 17 FOLFIRI 11/06/2017  Cycle 18 FOLFIRI 11/27/2017  cycle 19 FOLFIRI 12/18/2017  CT 01/06/2018-stable neck/mediastinal nodes, slight enlargement of gastrohepatic ligament node stable periportal and portacaval nodes  Cycle 20 FOLFIRI 01/08/2018  Cycle 21 FOLFIRI 01/29/2018  CT neck 02/06/2018- 4 cm necrotic right  levelIII/IVlymph node similar to the chest CT 01/06/2018, considerably larger than  on 07/30/2017. Unchanged adjacent right level IV/supraclavicular lymph node and partially visualized mediastinal lymph nodes.  Cycle 1 Taxol/ramucirumab 03/05/2018  Core biopsy right cervical lymph node 02/28/2018-metastatic adenocarcinoma, HER-2 negative PDL 1 score-60, MSI-stable, tumor mutational burden-10  Cycle 2 Taxol/ramucirumab7/06/2018  Cycle 3 Taxol/ramucirumab 04/30/2018  CTs 05/23/2018- decrease in mediastinal and gastrohepatic ligament adenopathy, decreased right sided level 3/4 node  Cycle 4 Taxol/ramucirumab 05/28/2018  Cycle 5 Taxol/ramucirumab 06/24/2018 (day 1 ramucirumab held secondary to right eye vision loss)  Cycle 6 Taxol/ramucirumab 07/23/2018  Cycle 7 Taxol/ramucirumab 08/19/2018  Cycle 8Taxol/ramucirumab 09/15/2018  Restaging CTs 10/14/2018-stable mediastinal adenopathy, stable disease in the neck.  Cycle 9 Taxol/ramucirumab 10/15/2018  Cycle 10 ramucirumab 11/12/2018 (Taxol held due to progressive neuropathy symptoms)  Taxol discontinued due to neuropathy, ramucirumab every 2 weeks 11/26/2018  2. Anemia secondary to #1-improved  3. Diabetes  4. Gout  5. Hypertension  6. Hyperlipidemia  7. History of a T7 compression fracture  8. Pain secondary to the gastric mass and mediastinal lymphadenopathy-resolved  9. Port-A-Cath placement 04/20/2016  10. 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 1 mm obstructing stone distal right ureter/right ureteral vesicle junction on CT 09/13/2016.  13. 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 Hypercalcemia 12/19/2016-asymptomatic  15. Nausea following FOLFIRI-emend added beginning with cycle 3, trial of Reglan  beginning12/08/2017, persistent  16.Erythematous, mildly indurated rash right upper posterior leg in setting of possible recent tick bitestatus post course of doxycycline.Resolved.  17.Sore throat/left buccal ulcer following cycle 1 Taxol/ramucirumab  18.Right eye vision loss- etiology unclear, potentially related to diabetes-evaluated by ophthalmology  19.Left tongue ulcer during cycle 7 Taxol/ramucirumab   Disposition: [name].  Plan to proceed with ramucirumab today as scheduled.  He will undergo restaging CTs prior to his next visit in 2 weeks.  He is having intermittent nausea.  He will continue Zofran as needed.  He will try to increase fluid intake.  He understands to contact the office if fluid intake is not adequate.  He will return for lab and follow-up in 2 weeks.  He will contact the office in the interim with any problems.  I contacted his significant other by phone and reviewed the above with her as well.  Plan reviewed with Dr. Benay Spice.     Ned Card ANP/GNP-BC   12/24/2018  10:13 AM

## 2018-12-24 NOTE — Patient Instructions (Addendum)
[facility] Discharge Instructions for Patients Receiving Chemotherapy  Today you received the following chemotherapy agents Cyramza   To help prevent nausea and vomiting after your treatment, we encourage you to take your nausea medication as directed.    If you develop nausea and vomiting that is not controlled by your nausea medication, call the clinic.   BELOW ARE SYMPTOMS THAT SHOULD BE REPORTED IMMEDIATELY:  *FEVER GREATER THAN 100.5 F  *CHILLS WITH OR WITHOUT FEVER  NAUSEA AND VOMITING THAT IS NOT CONTROLLED WITH YOUR NAUSEA MEDICATION  *UNUSUAL SHORTNESS OF BREATH  *UNUSUAL BRUISING OR BLEEDING  TENDERNESS IN MOUTH AND THROAT WITH OR WITHOUT PRESENCE OF ULCERS  *URINARY PROBLEMS  *BOWEL PROBLEMS  UNUSUAL RASH Items with * indicate a potential emergency and should be followed up as soon as possible.  Feel free to call the clinic should you have any questions or concerns. The clinic phone number is [phone].  Please show the [place] at check-in to the Emergency Department and triage nurse.

## 2018-12-25 ENCOUNTER — Telehealth: Payer: Self-pay | Admitting: Nurse Practitioner

## 2018-12-25 NOTE — Telephone Encounter (Signed)
Scheduled appt per 4/1 los.  Central radiology will contact patient about Ct appt.

## 2018-12-30 ENCOUNTER — Other Ambulatory Visit: Payer: Self-pay | Admitting: Oncology

## 2018-12-30 ENCOUNTER — Telehealth: Payer: Self-pay | Admitting: *Deleted

## 2018-12-30 MED ORDER — POTASSIUM CHLORIDE ER 10 MEQ PO CPCR: 10.0000 meq | ORAL_CAPSULE | Freq: Every day | ORAL | 0 refills | Status: AC

## 2018-12-30 NOTE — Telephone Encounter (Signed)
Reports having low grade fever past two days (99 maximum) and today it is normal. Two days ago developed lower stomach pain near the waistline and pain in past has been in upper stomach. Pain is constant and described as sharp at times and cramping in nature. Rates pain as "7" yesterday and today rated "3-4".Has been slightly nauseated and Zofran helps--today ate biscuit and gravy and hamburger. This RN  Informed him these may not have been the best food choices at this time if he is having nausea. Suggested a blander diet temporarily. He reports normal bowel movements. Asking for NP/MD thoughts on what is going on and what to do.

## 2018-12-30 NOTE — Telephone Encounter (Signed)
Left message for patient to return call.  Dr Benay Spice recommends soft/liquid, bland diet. To call for increased pain or high fever.

## 2019-01-04 ENCOUNTER — Other Ambulatory Visit: Payer: Self-pay | Admitting: Oncology

## 2019-01-07 ENCOUNTER — Encounter: Payer: Self-pay | Admitting: *Deleted

## 2019-01-07 ENCOUNTER — Inpatient Hospital Stay: Payer: Medicaid Other

## 2019-01-07 ENCOUNTER — Telehealth: Payer: Self-pay | Admitting: Oncology

## 2019-01-07 ENCOUNTER — Other Ambulatory Visit: Payer: Self-pay

## 2019-01-07 ENCOUNTER — Inpatient Hospital Stay (HOSPITAL_BASED_OUTPATIENT_CLINIC_OR_DEPARTMENT_OTHER): Payer: Medicaid Other | Admitting: Oncology

## 2019-01-07 ENCOUNTER — Other Ambulatory Visit: Payer: Self-pay | Admitting: *Deleted

## 2019-01-07 VITALS — BP 145/82 | HR 75 | Temp 98.3°F | Resp 18 | Ht 66.0 in | Wt 164.5 lb

## 2019-01-07 DIAGNOSIS — G620 Drug-induced polyneuropathy: Secondary | ICD-10-CM | POA: Diagnosis not present

## 2019-01-07 DIAGNOSIS — R110 Nausea: Secondary | ICD-10-CM | POA: Diagnosis not present

## 2019-01-07 DIAGNOSIS — C160 Malignant neoplasm of cardia: Secondary | ICD-10-CM

## 2019-01-07 DIAGNOSIS — I10 Essential (primary) hypertension: Secondary | ICD-10-CM

## 2019-01-07 DIAGNOSIS — E119 Type 2 diabetes mellitus without complications: Secondary | ICD-10-CM

## 2019-01-07 DIAGNOSIS — Z5112 Encounter for antineoplastic immunotherapy: Secondary | ICD-10-CM | POA: Diagnosis not present

## 2019-01-07 DIAGNOSIS — C778 Secondary and unspecified malignant neoplasm of lymph nodes of multiple regions: Secondary | ICD-10-CM

## 2019-01-07 DIAGNOSIS — Z95828 Presence of other vascular implants and grafts: Secondary | ICD-10-CM

## 2019-01-07 DIAGNOSIS — Z79899 Other long term (current) drug therapy: Secondary | ICD-10-CM

## 2019-01-07 LAB — CBC WITH DIFFERENTIAL (CANCER CENTER ONLY)
Abs Immature Granulocytes: 0.05 10*3/uL (ref 0.00–0.07)
Basophils Absolute: 0 10*3/uL (ref 0.0–0.1)
Basophils Relative: 0 %
Eosinophils Absolute: 0.3 10*3/uL (ref 0.0–0.5)
Eosinophils Relative: 3 %
HCT: 40.4 % (ref 39.0–52.0)
Hemoglobin: 12.8 g/dL — ABNORMAL LOW (ref 13.0–17.0)
Immature Granulocytes: 1 %
Lymphocytes Relative: 24 %
Lymphs Abs: 1.9 10*3/uL (ref 0.7–4.0)
MCH: 27.5 pg (ref 26.0–34.0)
MCHC: 31.7 g/dL (ref 30.0–36.0)
MCV: 86.7 fL (ref 80.0–100.0)
Monocytes Absolute: 0.6 10*3/uL (ref 0.1–1.0)
Monocytes Relative: 7 %
Neutro Abs: 5.2 10*3/uL (ref 1.7–7.7)
Neutrophils Relative %: 65 %
Platelet Count: 204 10*3/uL (ref 150–400)
RBC: 4.66 MIL/uL (ref 4.22–5.81)
RDW: 15 % (ref 11.5–15.5)
WBC Count: 8 10*3/uL (ref 4.0–10.5)
nRBC: 0 % (ref 0.0–0.2)

## 2019-01-07 LAB — CMP (CANCER CENTER ONLY)
ALT: 31 U/L (ref 0–44)
AST: 31 U/L (ref 15–41)
Albumin: 3.7 g/dL (ref 3.5–5.0)
Alkaline Phosphatase: 75 U/L (ref 38–126)
Anion gap: 11 (ref 5–15)
BUN: 11 mg/dL (ref 6–20)
CO2: 23 mmol/L (ref 22–32)
Calcium: 9.5 mg/dL (ref 8.9–10.3)
Chloride: 109 mmol/L (ref 98–111)
Creatinine: 0.82 mg/dL (ref 0.61–1.24)
GFR, Est AFR Am: >60 mL/min (ref >60)
GFR, Estimated: >60 mL/min (ref >60)
Glucose, Bld: 134 mg/dL — ABNORMAL HIGH (ref 70–99)
Potassium: 4.3 mmol/L (ref 3.5–5.1)
Sodium: 143 mmol/L (ref 135–145)
Total Bilirubin: 0.4 mg/dL (ref 0.3–1.2)
Total Protein: 7.3 g/dL (ref 6.5–8.1)

## 2019-01-07 LAB — TOTAL PROTEIN, URINE DIPSTICK: Protein, ur: NEGATIVE mg/dL

## 2019-01-07 MED ORDER — HEPARIN SOD (PORK) LOCK FLUSH 100 UNIT/ML IV SOLN
500.0000 [IU] | Freq: Once | INTRAVENOUS | Status: AC | PRN
  Administered 2019-01-07: 500 [IU]
  Filled 2019-01-07: qty 5

## 2019-01-07 MED ORDER — ACETAMINOPHEN 325 MG PO TABS
650.0000 mg | ORAL_TABLET | Freq: Once | ORAL | Status: AC
  Administered 2019-01-07: 650 mg via ORAL

## 2019-01-07 MED ORDER — SODIUM CHLORIDE 0.9 % IV SOLN
8.0000 mg/kg | Freq: Once | INTRAVENOUS | Status: AC
  Administered 2019-01-07: 600 mg via INTRAVENOUS
  Filled 2019-01-07: qty 50

## 2019-01-07 MED ORDER — SODIUM CHLORIDE 0.9 % IV SOLN
Freq: Once | INTRAVENOUS | Status: AC
  Administered 2019-01-07: 12:00:00 via INTRAVENOUS
  Filled 2019-01-07: qty 250

## 2019-01-07 MED ORDER — SODIUM CHLORIDE 0.9% FLUSH
10.0000 mL | INTRAVENOUS | Status: DC | PRN
  Administered 2019-01-07: 09:00:00 10 mL via INTRAVENOUS
  Filled 2019-01-07: qty 10

## 2019-01-07 MED ORDER — DIPHENHYDRAMINE HCL 50 MG/ML IJ SOLN
25.0000 mg | Freq: Once | INTRAMUSCULAR | Status: AC
  Administered 2019-01-07: 12:00:00 25 mg via INTRAVENOUS

## 2019-01-07 MED ORDER — DIPHENHYDRAMINE HCL 25 MG PO CAPS
ORAL_CAPSULE | ORAL | Status: AC
  Filled 2019-01-07: qty 1

## 2019-01-07 MED ORDER — SODIUM CHLORIDE 0.9% FLUSH
10.0000 mL | INTRAVENOUS | Status: DC | PRN
  Administered 2019-01-07: 10 mL
  Filled 2019-01-07: qty 10

## 2019-01-07 MED ORDER — ACETAMINOPHEN 325 MG PO TABS
ORAL_TABLET | ORAL | Status: AC
  Filled 2019-01-07: qty 2

## 2019-01-07 MED ORDER — DIPHENHYDRAMINE HCL 50 MG/ML IJ SOLN
INTRAMUSCULAR | Status: AC
  Filled 2019-01-07: qty 1

## 2019-01-07 NOTE — Telephone Encounter (Signed)
Per 4/15 los, appt already scheduled.

## 2019-01-07 NOTE — Progress Notes (Unsigned)
Called provider line on back of insurance card to confirm [facility] is in network for CT scan. [facility] on [address] was not. Scan canceled there and rescheduled for [facility] next week. Patient is aware. Received reference #[id number]

## 2019-01-07 NOTE — Progress Notes (Signed)
[place] OFFICE PROGRESS NOTE   Diagnosis: Gastric cancer  INTERVAL HISTORY:    Robert Beck completed another treatment ramucirumab on 12/24/2018.  He reports low abdominal pain last week.  No associated symptoms.  The pain resolved.  No dysphasia or neck pain.  He has intermittent nausea, relieved with Zofran.  He reports having episodes of "dry heaves ".  No change in neuropathy symptoms.  Objective:  Vital signs in last 24 hours:  Blood pressure (!) 145/82, pulse 75, temperature 98.3 F (36.8 C), temperature source Oral, resp. rate 18, height '5\' 6"'  (1.676 m), weight 164 lb 8 oz (74.6 kg), SpO2 98 %.    HEENT: Very slight fullness at the right low cervical area with mild associated tenderness.  No discrete mass. Lymphatics: No cervical or supraclavicular nodes GI: No hepatosplenomegaly, nontender, no mass Vascular: No leg edema    Portacath/PICC-without erythema  Lab Results:  Lab Results  Component Value Date   WBC 8.0 01/07/2019   HGB 12.8 (L) 01/07/2019   HCT 40.4 01/07/2019   MCV 86.7 01/07/2019   PLT 204 01/07/2019   NEUTROABS 5.2 01/07/2019    CMP  Lab Results  Component Value Date   NA 143 01/07/2019   K 4.3 01/07/2019   CL 109 01/07/2019   CO2 23 01/07/2019   GLUCOSE 134 (H) 01/07/2019   BUN 11 01/07/2019   CREATININE 0.82 01/07/2019   CALCIUM 9.5 01/07/2019   PROT 7.3 01/07/2019   ALBUMIN 3.7 01/07/2019   AST 31 01/07/2019   ALT 31 01/07/2019   ALKPHOS 75 01/07/2019   BILITOT 0.4 01/07/2019   GFRNONAA >60 01/07/2019   GFRAA >60 01/07/2019    Lab Results  Component Value Date   CEA1 3.83 06/27/2016     Medications: I have reviewed the patient's current medications.   Assessment/Plan: 1. Gastric cancer, gastric cardia mass extendingto the GE junction confirmed on endoscopy 04/11/2016, biopsy confirmed adenocarcinoma  Staging CTs of the chest, abdomen, and pelvis 04/09/2016-gastric cardia mass, mediastinal, right hilar,  and right supraclavicular lymphadenopathy  Biopsy of a right supraclavicular lymph node on 04/20/2016 revealed metastatic adenocarcinoma  FoundationACT 02/20/2017-TP53 alteration identified  Cycle 1 FOLFOX 04/30/2016 (oxaliplatin given 04/30/2016, 5-FU infusion started 05/02/2016)  Cycle 2 FOLFOX 05/14/2016  Cycle 3 FOLFOX 05/30/2016  Cycle 4 FOLFOX 06/13/2016  Cycle 5 FOLFOX 06/27/2016  Cycle 6 FOLFOX 07/11/2016  CTs 07/20/2016-decrease in supraclavicular, mediastinal, and gastrohepatic adenopathy. Decreased gastric cardia wall thickening.  Cycle 7 FOLFOX 07/25/2016  Cycle 8 FOLFOX 08/08/2016  Cycle 9 FOLFOX 08/22/2016 (oxaliplatin held due to neuropathy)  Cycle 10 FOLFOX 09/05/2016 (oxaliplatin held)  Cycle 11 FOLFOX 09/26/2016 (oxaliplatin held)  Cycle 12 FOLFOX 10/10/2016 (oxaliplatin held)  CT 10/18/2016-mild enlargement of mediastinal lymph nodes, no other evidence of disease progression, stable thickening at the GE junction and stable gastrohepatic lymphadenopathy  Cycle 13 FOLFOX 10/24/2016 (oxaliplatin held)  Maintenance Xeloda 7 days on/7 days off beginning 11/17/2016  Restaging chest CT 01/22/2017-interval progression of metastatic lymphadenopathy in the mediastinum and gastrohepatic ligament.  Salvage therapy with FOLFIRI initiated 02/20/2017  Cycle 5 FOLFIRI 04/17/2017  Restaging chest CT 04/29/2017-mild improvement in mediastinal adenopathy. No significant change in adenopathy in the gastrohepatic ligament. No evidence of disease progression.  Cycle 6 FOLFIRI 05/01/2017  Cycle 7 FOLFIRI 05/15/2017  Cycle 8 FOLFIRI 05/29/2017  Cycle 9 FOLFIRI 06/19/2017  Cycle 10 FOLFIRI 07/03/2017  Cycle 11 FOLFIRI 07/17/2017  CT chest 07/30/2017-mild decrease in mediastinal and gastrohepatic ligament lymphadenopathy, no evidence of progressive  disease  Cycle 12 FOLFIRI 07/31/2017  Cycle 13 FOLFIRI 08/14/2017  Cycle 14 FOLFIRI 09/04/2017  Cycle 15 FOLFIRI  09/25/2017  Cycle 16 FOLFIRI 10/16/2017  Cycle 17 FOLFIRI 11/06/2017  Cycle 18 FOLFIRI 11/27/2017  cycle 19 FOLFIRI 12/18/2017  CT 01/06/2018-stable neck/mediastinal nodes, slight enlargement of gastrohepatic ligament node stable periportal and portacaval nodes  Cycle 20 FOLFIRI 01/08/2018  Cycle 21 FOLFIRI 01/29/2018  CT neck 02/06/2018- 4 cm necrotic right levelIII/IVlymph node similar to the chest CT 01/06/2018, considerably larger than on 07/30/2017. Unchanged adjacent right level IV/supraclavicular lymph node and partially visualized mediastinal lymph nodes.  Cycle 1 Taxol/ramucirumab 03/05/2018  Core biopsy right cervical lymph node 02/28/2018-metastatic adenocarcinoma, HER-2 negative PDL 1 score-60, MSI-stable, tumor mutational burden-10  Cycle 2 Taxol/ramucirumab7/06/2018  Cycle 3 Taxol/ramucirumab 04/30/2018  CTs 05/23/2018- decrease in mediastinal and gastrohepatic ligament adenopathy, decreased right sided level 3/4 node  Cycle 4 Taxol/ramucirumab 05/28/2018  Cycle 5 Taxol/ramucirumab 06/24/2018 (day 1 ramucirumab held secondary to right eye vision loss)  Cycle 6 Taxol/ramucirumab 07/23/2018  Cycle 7 Taxol/ramucirumab 08/19/2018  Cycle 8Taxol/ramucirumab 09/15/2018  Restaging CTs 10/14/2018-stable mediastinal adenopathy, stable disease in the neck.  Cycle 9 Taxol/ramucirumab 10/15/2018  Cycle 10 ramucirumab 11/12/2018 (Taxol held due to progressive neuropathy symptoms)  Taxol discontinued due to neuropathy, ramucirumab every 2 weeks 11/26/2018  2. Anemia secondary to #1-improved  3. Diabetes  4. Gout  5. Hypertension  6. Hyperlipidemia  7. History of a T7 compression fracture  8. Pain secondary to the gastric mass and mediastinal lymphadenopathy-resolved  9. Port-A-Cath placement 04/20/2016  10. 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 1 mm  obstructing stone distal right ureter/right ureteral vesicle junction on CT 09/13/2016.  13. 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 Hypercalcemia 12/19/2016-asymptomatic  15. Nausea following FOLFIRI-emend added beginning with cycle 3, trial of Reglan beginning12/08/2017, persistent  16.Erythematous, mildly indurated rash right upper posterior leg in setting of possible recent tick bitestatus post course of doxycycline.Resolved.  17.Sore throat/left buccal ulcer following cycle 1 Taxol/ramucirumab  18.Right eye vision loss- etiology unclear, potentially related to diabetes-evaluated by ophthalmology  19.Left tongue ulcer during cycle 7 Taxol/ramucirumab     Disposition: Robert Beck appears unchanged.  He will complete another treatment with ramucirumab today.  He will undergo restaging CTs prior to an office visit in 2 weeks. The plan is to continue ramucirumab if there is no evidence of disease progression.  He will be treated with immunotherapy if there is progressive disease.  I will submit the lymph node for gastric tumor tissue for CPS testing.  Betsy Coder, MD  01/07/2019  10:45 AM

## 2019-01-07 NOTE — Progress Notes (Signed)
Per MD request called [facility] and spoke with Amber. Requested PDL-1 testing with combined positive score for case [phone] (lymph node bx). Code 16.0 Stage IV

## 2019-01-07 NOTE — Patient Instructions (Signed)
[place] Discharge Instructions for Patients Receiving Chemotherapy  Today you received the following chemotherapy agents Cyramza   To help prevent nausea and vomiting after your treatment, we encourage you to take your nausea medication as directed.    If you develop nausea and vomiting that is not controlled by your nausea medication, call the clinic.   BELOW ARE SYMPTOMS THAT SHOULD BE REPORTED IMMEDIATELY:  *FEVER GREATER THAN 100.5 F  *CHILLS WITH OR WITHOUT FEVER  NAUSEA AND VOMITING THAT IS NOT CONTROLLED WITH YOUR NAUSEA MEDICATION  *UNUSUAL SHORTNESS OF BREATH  *UNUSUAL BRUISING OR BLEEDING  TENDERNESS IN MOUTH AND THROAT WITH OR WITHOUT PRESENCE OF ULCERS  *URINARY PROBLEMS  *BOWEL PROBLEMS  UNUSUAL RASH Items with * indicate a potential emergency and should be followed up as soon as possible.  Feel free to call the clinic should you have any questions or concerns. The clinic phone number is [phone].  Please show the [place] at check-in to the Emergency Department and triage nurse.

## 2019-01-12 ENCOUNTER — Telehealth: Payer: Self-pay | Admitting: *Deleted

## 2019-01-12 NOTE — Telephone Encounter (Signed)
Notified wife of CT scan at [facility] on [address] in [place] [phone]). Scan on 4/21 at 0845/0900--reminded wife to have him ask for a CD of the scan when he leaves. This was also placed in the order.

## 2019-01-13 ENCOUNTER — Other Ambulatory Visit: Payer: Medicaid Other

## 2019-01-13 ENCOUNTER — Ambulatory Visit (HOSPITAL_COMMUNITY): Payer: Medicaid Other

## 2019-01-18 ENCOUNTER — Other Ambulatory Visit: Payer: Self-pay | Admitting: Oncology

## 2019-01-21 ENCOUNTER — Telehealth: Payer: Self-pay | Admitting: Oncology

## 2019-01-21 ENCOUNTER — Inpatient Hospital Stay: Payer: Medicaid Other

## 2019-01-21 ENCOUNTER — Inpatient Hospital Stay (HOSPITAL_BASED_OUTPATIENT_CLINIC_OR_DEPARTMENT_OTHER): Payer: Medicaid Other | Admitting: Oncology

## 2019-01-21 ENCOUNTER — Encounter: Payer: Self-pay | Admitting: *Deleted

## 2019-01-21 ENCOUNTER — Other Ambulatory Visit: Payer: Self-pay

## 2019-01-21 VITALS — BP 150/90

## 2019-01-21 DIAGNOSIS — G620 Drug-induced polyneuropathy: Secondary | ICD-10-CM

## 2019-01-21 DIAGNOSIS — R161 Splenomegaly, not elsewhere classified: Secondary | ICD-10-CM

## 2019-01-21 DIAGNOSIS — Z5112 Encounter for antineoplastic immunotherapy: Secondary | ICD-10-CM | POA: Diagnosis not present

## 2019-01-21 DIAGNOSIS — E119 Type 2 diabetes mellitus without complications: Secondary | ICD-10-CM | POA: Diagnosis not present

## 2019-01-21 DIAGNOSIS — C160 Malignant neoplasm of cardia: Secondary | ICD-10-CM

## 2019-01-21 DIAGNOSIS — Z95828 Presence of other vascular implants and grafts: Secondary | ICD-10-CM

## 2019-01-21 DIAGNOSIS — Z79899 Other long term (current) drug therapy: Secondary | ICD-10-CM

## 2019-01-21 DIAGNOSIS — C778 Secondary and unspecified malignant neoplasm of lymph nodes of multiple regions: Secondary | ICD-10-CM

## 2019-01-21 LAB — CBC WITH DIFFERENTIAL (CANCER CENTER ONLY)
Abs Immature Granulocytes: 0.02 10*3/uL (ref 0.00–0.07)
Basophils Absolute: 0 10*3/uL (ref 0.0–0.1)
Basophils Relative: 1 %
Eosinophils Absolute: 0.3 10*3/uL (ref 0.0–0.5)
Eosinophils Relative: 4 %
HCT: 39.7 % (ref 39.0–52.0)
Hemoglobin: 12.8 g/dL — ABNORMAL LOW (ref 13.0–17.0)
Immature Granulocytes: 0 %
Lymphocytes Relative: 22 %
Lymphs Abs: 1.7 10*3/uL (ref 0.7–4.0)
MCH: 27.6 pg (ref 26.0–34.0)
MCHC: 32.2 g/dL (ref 30.0–36.0)
MCV: 85.6 fL (ref 80.0–100.0)
Monocytes Absolute: 0.5 10*3/uL (ref 0.1–1.0)
Monocytes Relative: 7 %
Neutro Abs: 4.9 10*3/uL (ref 1.7–7.7)
Neutrophils Relative %: 66 %
Platelet Count: 175 10*3/uL (ref 150–400)
RBC: 4.64 MIL/uL (ref 4.22–5.81)
RDW: 15.6 % — ABNORMAL HIGH (ref 11.5–15.5)
WBC Count: 7.4 10*3/uL (ref 4.0–10.5)
nRBC: 0 % (ref 0.0–0.2)

## 2019-01-21 LAB — CMP (CANCER CENTER ONLY)
ALT: 20 U/L (ref 0–44)
AST: 18 U/L (ref 15–41)
Albumin: 3.9 g/dL (ref 3.5–5.0)
Alkaline Phosphatase: 78 U/L (ref 38–126)
Anion gap: 9 (ref 5–15)
BUN: 11 mg/dL (ref 6–20)
CO2: 24 mmol/L (ref 22–32)
Calcium: 9.7 mg/dL (ref 8.9–10.3)
Chloride: 107 mmol/L (ref 98–111)
Creatinine: 0.83 mg/dL (ref 0.61–1.24)
GFR, Est AFR Am: >60 mL/min (ref >60)
GFR, Estimated: >60 mL/min (ref >60)
Glucose, Bld: 152 mg/dL — ABNORMAL HIGH (ref 70–99)
Potassium: 3.9 mmol/L (ref 3.5–5.1)
Sodium: 140 mmol/L (ref 135–145)
Total Bilirubin: 0.5 mg/dL (ref 0.3–1.2)
Total Protein: 7.4 g/dL (ref 6.5–8.1)

## 2019-01-21 MED ORDER — DIPHENHYDRAMINE HCL 50 MG/ML IJ SOLN
25.0000 mg | Freq: Once | INTRAMUSCULAR | Status: AC
  Administered 2019-01-21: 11:00:00 25 mg via INTRAVENOUS

## 2019-01-21 MED ORDER — DIPHENHYDRAMINE HCL 50 MG/ML IJ SOLN
INTRAMUSCULAR | Status: AC
  Filled 2019-01-21: qty 1

## 2019-01-21 MED ORDER — SODIUM CHLORIDE 0.9% FLUSH
10.0000 mL | INTRAVENOUS | Status: DC | PRN
  Administered 2019-01-21: 10 mL via INTRAVENOUS
  Filled 2019-01-21: qty 10

## 2019-01-21 MED ORDER — SODIUM CHLORIDE 0.9 % IV SOLN
Freq: Once | INTRAVENOUS | Status: AC
  Administered 2019-01-21: 11:00:00 via INTRAVENOUS
  Filled 2019-01-21: qty 250

## 2019-01-21 MED ORDER — SODIUM CHLORIDE 0.9 % IV SOLN
8.0000 mg/kg | Freq: Once | INTRAVENOUS | Status: AC
  Administered 2019-01-21: 12:00:00 600 mg via INTRAVENOUS
  Filled 2019-01-21: qty 50

## 2019-01-21 MED ORDER — HEPARIN SOD (PORK) LOCK FLUSH 100 UNIT/ML IV SOLN
500.0000 [IU] | Freq: Once | INTRAVENOUS | Status: AC | PRN
  Administered 2019-01-21: 500 [IU]
  Filled 2019-01-21: qty 5

## 2019-01-21 MED ORDER — ONDANSETRON HCL 8 MG PO TABS: 8.0000 mg | ORAL_TABLET | Freq: Three times a day (TID) | ORAL | 3 refills | Status: AC | PRN

## 2019-01-21 MED ORDER — ACETAMINOPHEN 325 MG PO TABS
ORAL_TABLET | ORAL | Status: AC
  Filled 2019-01-21: qty 2

## 2019-01-21 MED ORDER — SODIUM CHLORIDE 0.9% FLUSH
10.0000 mL | INTRAVENOUS | Status: DC | PRN
  Administered 2019-01-21: 10 mL
  Filled 2019-01-21: qty 10

## 2019-01-21 MED ORDER — ACETAMINOPHEN 325 MG PO TABS
650.0000 mg | ORAL_TABLET | Freq: Once | ORAL | Status: AC
  Administered 2019-01-21: 11:00:00 650 mg via ORAL

## 2019-01-21 NOTE — Telephone Encounter (Signed)
Scheduled appt per 4/29 los. °

## 2019-01-21 NOTE — Progress Notes (Unsigned)
Per Tedra Coupe at radiology, our radiologists will not do the comparison since the most recent scan was done before the [facility] scan. They are mailing a CD of the [facility] scan in January to [facility] and requesting their radiologist do the comparison and fax report.

## 2019-01-21 NOTE — Patient Instructions (Signed)
[place] Discharge Instructions for Patients Receiving Chemotherapy  Today you received the following chemotherapy agents Cyramza   To help prevent nausea and vomiting after your treatment, we encourage you to take your nausea medication as directed.    If you develop nausea and vomiting that is not controlled by your nausea medication, call the clinic.   BELOW ARE SYMPTOMS THAT SHOULD BE REPORTED IMMEDIATELY:  *FEVER GREATER THAN 100.5 F  *CHILLS WITH OR WITHOUT FEVER  NAUSEA AND VOMITING THAT IS NOT CONTROLLED WITH YOUR NAUSEA MEDICATION  *UNUSUAL SHORTNESS OF BREATH  *UNUSUAL BRUISING OR BLEEDING  TENDERNESS IN MOUTH AND THROAT WITH OR WITHOUT PRESENCE OF ULCERS  *URINARY PROBLEMS  *BOWEL PROBLEMS  UNUSUAL RASH Items with * indicate a potential emergency and should be followed up as soon as possible.  Feel free to call the clinic should you have any questions or concerns. The clinic phone number is [phone].  Please show the [place] at check-in to the Emergency Department and triage nurse.

## 2019-01-21 NOTE — Progress Notes (Signed)
[place] OFFICE PROGRESS NOTE   Diagnosis: Gastric cancer  INTERVAL HISTORY:   Robert Beck completed another treatment with ramucirumab on 01/07/2019.  No bleeding.  No dysphasia.  No neck pain.  He continues to have neuropathy symptoms in the feet.  He has pain at the sole and dorsum of the distal right foot.  He underwent restaging CTs at [facility] on 01/13/2019.  A CT of the chest was compared to a CT from 10/14/2018.  A few mediastinal lymph nodes have increased in size including a 7 mm pretracheal node and an 11 mm subcarinal node.  Esophagus appeared normal.  Neurologic changes of cirrhosis.  Mild splenomegaly.  Unchanged 8 mm perigastric node, unchanged 11 mm node at the lesser curvature.  Decreased perigastric node now measuring 6 mm.  A CT of the neck revealed a right level 3 node measuring 1.9 x 1.7 x 2.6 cm.  There is another large node posterior lateral to the internal jugular vein.  Objective:  Vital signs in last 24 hours:  Blood pressure (!) 152/95, pulse 96, temperature 98 F (36.7 C), temperature source Oral, resp. rate 18, height _0  (1.676 m), weight 165 lb 8 oz (75.1 kg), SpO2 99 %.    HEENT: Neck without mass Lymphatics: No cervical or supraclavicular nodes Musculoskeletal: No mass, erythema, or tenderness at the right foot  Portacath/PICC-without erythema  Lab Results:  Lab Results  Component Value Date   WBC 7.4 01/21/2019   HGB 12.8 (L) 01/21/2019   HCT 39.7 01/21/2019   MCV 85.6 01/21/2019   PLT 175 01/21/2019   NEUTROABS 4.9 01/21/2019    CMP  Lab Results  Component Value Date   NA 140 01/21/2019   K 3.9 01/21/2019   CL 107 01/21/2019   CO2 24 01/21/2019   GLUCOSE 152 (H) 01/21/2019   BUN 11 01/21/2019   CREATININE 0.83 01/21/2019   CALCIUM 9.7 01/21/2019   PROT 7.4 01/21/2019   ALBUMIN 3.9 01/21/2019   AST 18 01/21/2019   ALT 20 01/21/2019   ALKPHOS 78 01/21/2019   BILITOT 0.5 01/21/2019   GFRNONAA  >60 01/21/2019   GFRAA >60 01/21/2019    Lab Results  Component Value Date   CEA1 3.83 06/27/2016     Medications: I have reviewed the patient's current medications.   Assessment/Plan:  1. 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 Decreased gastric cardia wall thickening.  Cycle 7 FOLFOX 07/25/2016  Cycle 8 FOLFOX 08/08/2016  Cycle 9 FOLFOX 08/22/2016 (oxaliplatin held due to neuropathy)  Cycle 10 FOLFOX 09/05/2016 (oxaliplatin held)  Cycle 11 FOLFOX 09/26/2016 (oxaliplatin held)  Cycle 12 FOLFOX 10/10/2016 (oxaliplatin held)  CT 10/18/2016-mild enlargement of mediastinal lymph nodes, no other evidence of disease progression, stable thickening at the GE junction and stable gastrohepatic lymphadenopathy  Cycle 13 FOLFOX 10/24/2016 (oxaliplatin held)  Maintenance Xeloda 7 days on/7 days off beginning 11/17/2016  Restaging chest CT 01/22/2017-interval progression of metastatic lymphadenopathy  in the mediastinum and gastrohepatic ligament.  Salvage therapy with FOLFIRI initiated 02/20/2017  Cycle 5 FOLFIRI 04/17/2017  Restaging chest CT 04/29/2017-mild improvement in mediastinal adenopathy. No significant change in adenopathy in the gastrohepatic ligament. No evidence of  disease progression.  Cycle 6 FOLFIRI 05/01/2017  Cycle 7 FOLFIRI 05/15/2017  Cycle 8 FOLFIRI 05/29/2017  Cycle 9 FOLFIRI 06/19/2017  Cycle 10 FOLFIRI 07/03/2017  Cycle 11 FOLFIRI 07/17/2017  CT chest 07/30/2017-mild decrease in mediastinal and gastrohepatic ligament lymphadenopathy, no evidence of progressive disease  Cycle 12 FOLFIRI 07/31/2017  Cycle 13 FOLFIRI 08/14/2017  Cycle 14 FOLFIRI 09/04/2017  Cycle 15 FOLFIRI 09/25/2017  Cycle 16 FOLFIRI 10/16/2017  Cycle 17 FOLFIRI 11/06/2017  Cycle 18 FOLFIRI 11/27/2017  cycle 19 FOLFIRI 12/18/2017  CT 01/06/2018-stable neck/mediastinal nodes, slight enlargement of gastrohepatic ligament node stable periportal and portacaval nodes  Cycle 20 FOLFIRI 01/08/2018  Cycle 21 FOLFIRI 01/29/2018  CT neck 02/06/2018- 4 cm necrotic right levelIII/IVlymph node similar to the chest CT 01/06/2018, considerably larger than on 07/30/2017. Unchanged adjacent right level IV/supraclavicular lymph node and partially visualized mediastinal lymph nodes.  Cycle 1 Taxol/ramucirumab 03/05/2018  Core biopsy right cervical lymph node 02/28/2018-metastatic adenocarcinoma, HER-2 negative PDL 1 score-60, MSI-stable, tumor mutational burden-10  Cycle 2 Taxol/ramucirumab7/06/2018  Cycle 3 Taxol/ramucirumab 04/30/2018  CTs 05/23/2018- decrease in mediastinal and gastrohepatic ligament adenopathy, decreased right sided level 3/4 node  Cycle 4 Taxol/ramucirumab 05/28/2018  Cycle 5 Taxol/ramucirumab 06/24/2018 (day 1 ramucirumab held secondary to right eye vision loss)  Cycle 6 Taxol/ramucirumab 07/23/2018  Cycle 7 Taxol/ramucirumab 08/19/2018  Cycle 8Taxol/ramucirumab 09/15/2018  Restaging CTs 10/14/2018-stable mediastinal adenopathy, stable disease in the neck.  Cycle 9 Taxol/ramucirumab 10/15/2018  Cycle 10 ramucirumab 11/12/2018 (Taxol held due to progressive neuropathy symptoms)  Taxol discontinued due to neuropathy, ramucirumab every 2 weeks  11/26/2018  2. Anemia secondary to #1-improved  3. Diabetes  4. Gout  5. Hypertension  6. Hyperlipidemia  7. History of a T7 compression fracture  8. Pain secondary to the gastric mass and mediastinal lymphadenopathy-resolved  9. Port-A-Cath placement 04/20/2016  10. 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 1 mm obstructing stone distal right ureter/right ureteral vesicle junction on CT 09/13/2016.  13. 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 Hypercalcemia 12/19/2016-asymptomatic  15. Nausea following FOLFIRI-emend added beginning with cycle 3, trial of Reglan beginning12/08/2017, persistent  16.Erythematous, mildly indurated rash right upper posterior leg in setting of possible recent tick bitestatus post course of doxycycline.Resolved.  17.Sore throat/left buccal ulcer following cycle 1 Taxol/ramucirumab  18.Right eye vision loss- etiology unclear, potentially related to diabetes-evaluated by ophthalmology  19.Left tongue ulcer during cycle 7 Taxol/ramucirumab     Disposition: Robert Beck appears unchanged.  He will complete another treatment with ramucirumab today.  The restaging CTs at outside hospital revealed no clear evidence of progressive metastatic disease.  We will asked for a direct comparison to the most recent neck and chest CTs here.  The plan is to make a change to pembrolizumab if there is clear evidence of disease progression.  I will review the CT images when they are loaded into our electronic system.  We will present his case at the GI tumor conference next week.  Robert Beck will return for an office visit in 2 weeks.  Betsy Coder, MD  01/21/2019  1:16 PM

## 2019-02-01 ENCOUNTER — Other Ambulatory Visit: Payer: Self-pay | Admitting: Oncology

## 2019-02-04 ENCOUNTER — Inpatient Hospital Stay: Payer: Medicaid Other

## 2019-02-04 ENCOUNTER — Inpatient Hospital Stay (HOSPITAL_BASED_OUTPATIENT_CLINIC_OR_DEPARTMENT_OTHER): Payer: Medicaid Other | Admitting: Nurse Practitioner

## 2019-02-04 ENCOUNTER — Other Ambulatory Visit: Payer: Self-pay

## 2019-02-04 ENCOUNTER — Inpatient Hospital Stay: Payer: Medicaid Other | Attending: Oncology

## 2019-02-04 VITALS — BP 140/98 | HR 87 | Temp 98.9°F | Resp 18 | Ht 66.0 in | Wt 167.4 lb

## 2019-02-04 DIAGNOSIS — C160 Malignant neoplasm of cardia: Secondary | ICD-10-CM | POA: Diagnosis not present

## 2019-02-04 DIAGNOSIS — T451X5A Adverse effect of antineoplastic and immunosuppressive drugs, initial encounter: Secondary | ICD-10-CM | POA: Insufficient documentation

## 2019-02-04 DIAGNOSIS — Z95828 Presence of other vascular implants and grafts: Secondary | ICD-10-CM

## 2019-02-04 DIAGNOSIS — C778 Secondary and unspecified malignant neoplasm of lymph nodes of multiple regions: Secondary | ICD-10-CM | POA: Insufficient documentation

## 2019-02-04 DIAGNOSIS — G620 Drug-induced polyneuropathy: Secondary | ICD-10-CM | POA: Diagnosis not present

## 2019-02-04 DIAGNOSIS — Z5112 Encounter for antineoplastic immunotherapy: Secondary | ICD-10-CM | POA: Diagnosis not present

## 2019-02-04 LAB — CMP (CANCER CENTER ONLY)
ALT: 26 U/L (ref 0–44)
AST: 27 U/L (ref 15–41)
Albumin: 3.8 g/dL (ref 3.5–5.0)
Alkaline Phosphatase: 73 U/L (ref 38–126)
Anion gap: 10 (ref 5–15)
BUN: 13 mg/dL (ref 6–20)
CO2: 25 mmol/L (ref 22–32)
Calcium: 9.6 mg/dL (ref 8.9–10.3)
Chloride: 106 mmol/L (ref 98–111)
Creatinine: 0.9 mg/dL (ref 0.61–1.24)
GFR, Est AFR Am: >60 mL/min (ref >60)
GFR, Estimated: >60 mL/min (ref >60)
Glucose, Bld: 186 mg/dL — ABNORMAL HIGH (ref 70–99)
Potassium: 4 mmol/L (ref 3.5–5.1)
Sodium: 141 mmol/L (ref 135–145)
Total Bilirubin: 0.4 mg/dL (ref 0.3–1.2)
Total Protein: 7.3 g/dL (ref 6.5–8.1)

## 2019-02-04 LAB — CBC WITH DIFFERENTIAL (CANCER CENTER ONLY)
Abs Immature Granulocytes: 0.01 10*3/uL (ref 0.00–0.07)
Basophils Absolute: 0 10*3/uL (ref 0.0–0.1)
Basophils Relative: 1 %
Eosinophils Absolute: 0.4 10*3/uL (ref 0.0–0.5)
Eosinophils Relative: 5 %
HCT: 40.6 % (ref 39.0–52.0)
Hemoglobin: 12.8 g/dL — ABNORMAL LOW (ref 13.0–17.0)
Immature Granulocytes: 0 %
Lymphocytes Relative: 28 %
Lymphs Abs: 2 10*3/uL (ref 0.7–4.0)
MCH: 27.4 pg (ref 26.0–34.0)
MCHC: 31.5 g/dL (ref 30.0–36.0)
MCV: 86.9 fL (ref 80.0–100.0)
Monocytes Absolute: 0.5 10*3/uL (ref 0.1–1.0)
Monocytes Relative: 7 %
Neutro Abs: 4.3 10*3/uL (ref 1.7–7.7)
Neutrophils Relative %: 59 %
Platelet Count: 205 10*3/uL (ref 150–400)
RBC: 4.67 MIL/uL (ref 4.22–5.81)
RDW: 15.8 % — ABNORMAL HIGH (ref 11.5–15.5)
WBC Count: 7.2 10*3/uL (ref 4.0–10.5)
nRBC: 0 % (ref 0.0–0.2)

## 2019-02-04 LAB — TOTAL PROTEIN, URINE DIPSTICK: Protein, ur: NEGATIVE mg/dL

## 2019-02-04 MED ORDER — SODIUM CHLORIDE 0.9% FLUSH
10.0000 mL | INTRAVENOUS | Status: DC | PRN
  Administered 2019-02-04: 10 mL
  Filled 2019-02-04: qty 10

## 2019-02-04 MED ORDER — ACETAMINOPHEN 325 MG PO TABS
650.0000 mg | ORAL_TABLET | Freq: Once | ORAL | Status: AC
  Administered 2019-02-04: 15:00:00 650 mg via ORAL

## 2019-02-04 MED ORDER — HEPARIN SOD (PORK) LOCK FLUSH 100 UNIT/ML IV SOLN
500.0000 [IU] | Freq: Once | INTRAVENOUS | Status: AC | PRN
  Administered 2019-02-04: 16:00:00 500 [IU]
  Filled 2019-02-04: qty 5

## 2019-02-04 MED ORDER — DIPHENHYDRAMINE HCL 50 MG/ML IJ SOLN
INTRAMUSCULAR | Status: AC
  Filled 2019-02-04: qty 1

## 2019-02-04 MED ORDER — DIPHENHYDRAMINE HCL 50 MG/ML IJ SOLN
25.0000 mg | Freq: Once | INTRAMUSCULAR | Status: AC
  Administered 2019-02-04: 15:00:00 25 mg via INTRAVENOUS

## 2019-02-04 MED ORDER — ACETAMINOPHEN 325 MG PO TABS
ORAL_TABLET | ORAL | Status: AC
  Filled 2019-02-04: qty 2

## 2019-02-04 MED ORDER — SODIUM CHLORIDE 0.9 % IV SOLN
8.0000 mg/kg | Freq: Once | INTRAVENOUS | Status: AC
  Administered 2019-02-04: 15:00:00 600 mg via INTRAVENOUS
  Filled 2019-02-04: qty 50

## 2019-02-04 MED ORDER — SODIUM CHLORIDE 0.9 % IV SOLN
Freq: Once | INTRAVENOUS | Status: AC
  Administered 2019-02-04: 15:00:00 via INTRAVENOUS
  Filled 2019-02-04: qty 250

## 2019-02-04 MED ORDER — SODIUM CHLORIDE 0.9% FLUSH
10.0000 mL | INTRAVENOUS | Status: DC | PRN
  Administered 2019-02-04: 10 mL via INTRAVENOUS
  Filled 2019-02-04: qty 10

## 2019-02-04 NOTE — Progress Notes (Addendum)
[facility] OFFICE PROGRESS NOTE   Diagnosis: Gastric cancer  INTERVAL HISTORY:   [name] returns as scheduled.  He completed another cycle of ramucirumab 01/21/2019.  He has intermittent nausea.  He takes Zofran as needed.  No mouth sores.  No significant diarrhea.  He takes Imodium if needed.  He denies pain.  No symptoms of thrombosis.  No bleeding.  Objective:  Vital signs in last 24 hours:  Blood pressure (!) 140/98, pulse 87, temperature 98.9 F (37.2 C), temperature source Oral, resp. rate 18, height '5\' 6"'  (1.676 m), weight 167 lb 6.4 oz (75.9 kg), SpO2 100 %.    Lymphatics: No palpable cervical or supraclavicular lymph nodes. Vascular: No leg edema. Port-A-Cath without erythema.  Lab Results:  Lab Results  Component Value Date   WBC 7.2 02/04/2019   HGB 12.8 (L) 02/04/2019   HCT 40.6 02/04/2019   MCV 86.9 02/04/2019   PLT 205 02/04/2019   NEUTROABS 4.3 02/04/2019    Imaging:  No results found.  Medications: I have reviewed the patient's current medications.  Assessment/Plan: 1. Gastric cancer, gastric cardia mass extendingto the GE junction confirmed on endoscopy 04/11/2016, biopsy confirmed adenocarcinoma  Staging CTs of the chest, abdomen, and pelvis 04/09/2016-gastric cardia mass, mediastinal, right hilar, and right supraclavicular lymphadenopathy  Biopsy of a right supraclavicular lymph node on 04/20/2016 revealed metastatic adenocarcinoma  FoundationACT 02/20/2017-TP53 alteration identified, PDL 1 0%  Cycle 1 FOLFOX 04/30/2016 (oxaliplatin given 04/30/2016, 5-FU infusion started 05/02/2016)  Cycle 2 FOLFOX 05/14/2016  Cycle 3 FOLFOX 05/30/2016  Cycle 4 FOLFOX 06/13/2016  Cycle 5 FOLFOX 06/27/2016  Cycle 6 FOLFOX 07/11/2016  CTs 07/20/2016-decrease in supraclavicular, mediastinal, and gastrohepatic adenopathy. Decreased gastric cardia wall thickening.  Cycle 7 FOLFOX 07/25/2016  Cycle 8 FOLFOX 08/08/2016  Cycle 9  FOLFOX 08/22/2016 (oxaliplatin held due to neuropathy)  Cycle 10 FOLFOX 09/05/2016 (oxaliplatin held)  Cycle 11 FOLFOX 09/26/2016 (oxaliplatin held)  Cycle 12 FOLFOX 10/10/2016 (oxaliplatin held)  CT 10/18/2016-mild enlargement of mediastinal lymph nodes, no other evidence of disease progression, stable thickening at the GE junction and stable gastrohepatic lymphadenopathy  Cycle 13 FOLFOX 10/24/2016 (oxaliplatin held)  Maintenance Xeloda 7 days on/7 days off beginning 11/17/2016  Restaging chest CT 01/22/2017-interval progression of metastatic lymphadenopathy in the mediastinum and gastrohepatic ligament.  Salvage therapy with FOLFIRI initiated 02/20/2017  Cycle 5 FOLFIRI 04/17/2017  Restaging chest CT 04/29/2017-mild improvement in mediastinal adenopathy. No significant change in adenopathy in the gastrohepatic ligament. No evidence of disease progression.  Cycle 6 FOLFIRI 05/01/2017  Cycle 7 FOLFIRI 05/15/2017  Cycle 8 FOLFIRI 05/29/2017  Cycle 9 FOLFIRI 06/19/2017  Cycle 10 FOLFIRI 07/03/2017  Cycle 11 FOLFIRI 07/17/2017  CT chest 07/30/2017-mild decrease in mediastinal and gastrohepatic ligament lymphadenopathy, no evidence of progressive disease  Cycle 12 FOLFIRI 07/31/2017  Cycle 13 FOLFIRI 08/14/2017  Cycle 14 FOLFIRI 09/04/2017  Cycle 15 FOLFIRI 09/25/2017  Cycle 16 FOLFIRI 10/16/2017  Cycle 17 FOLFIRI 11/06/2017  Cycle 18 FOLFIRI 11/27/2017  cycle 19 FOLFIRI 12/18/2017  CT 01/06/2018-stable neck/mediastinal nodes, slight enlargement of gastrohepatic ligament node stable periportal and portacaval nodes  Cycle 20 FOLFIRI 01/08/2018  Cycle 21 FOLFIRI 01/29/2018  CT neck 02/06/2018- 4 cm necrotic right levelIII/IVlymph node similar to the chest CT 01/06/2018, considerably larger than on 07/30/2017. Unchanged adjacent right level IV/supraclavicular lymph node and partially visualized mediastinal lymph nodes.  Cycle 1 Taxol/ramucirumab 03/05/2018  Core biopsy  right cervical lymph node 02/28/2018-metastatic adenocarcinoma, HER-2 negative PDL 1 score-60, MSI-stable, tumor mutational burden-10  Cycle  2 Taxol/ramucirumab7/06/2018  Cycle 3 Taxol/ramucirumab 04/30/2018  CTs 05/23/2018- decrease in mediastinal and gastrohepatic ligament adenopathy, decreased right sided level 3/4 node  Cycle 4 Taxol/ramucirumab 05/28/2018  Cycle 5 Taxol/ramucirumab 06/24/2018 (day 1 ramucirumab held secondary to right eye vision loss)  Cycle 6 Taxol/ramucirumab 07/23/2018  Cycle 7 Taxol/ramucirumab 08/19/2018  Cycle 8Taxol/ramucirumab 09/15/2018  Restaging CTs 10/14/2018-stable mediastinal adenopathy, stable disease in the neck.  Cycle 9 Taxol/ramucirumab 10/15/2018  Cycle 10 ramucirumab 11/12/2018 (Taxol held due to progressive neuropathy symptoms)  Taxol discontinued due to neuropathy, ramucirumab every 2 weeks 11/26/2018  Restaging CTs 01/13/2019- right-sided level 3 nodal process between the internal jugular vein and the common carotid artery measures 1.9 x 1.7 x 2.6 cm versus 2.2 x 1.8 x 2.9 cm on 10/14/2018.  Right thoracic inlet lymph node measures 1.7 x 1.4 x 1.5 cm compared to 1.7 x 1.2 x 1.7 cm on the prior study.  1.6 x 0.8 cm lymph node posterior to the left common carotid artery at the level of the thyroid with previous measurement 1.6 x 0.6 cm. Pretracheal lymph node measures 7 mm as compared to 4 mm previously; subcarinal lymph node 11 mm, previously 7 mm.  Unchanged perigastric node measuring 8 mm; mildly prominent unchanged lymph node along the inferior aspect of the lesser curvature of the stomach measuring 11 mm; previously imaged enlarged perigastric lymph node slightly decreased.  Changes of cirrhosis noted.  2. Anemia secondary to #1-improved  3. Diabetes  4. Gout  5. Hypertension  6. Hyperlipidemia  7. History of a T7 compression fracture  8. Pain secondary to the gastric mass and mediastinal lymphadenopathy-resolved  9.  Port-A-Cath placement 04/20/2016  10. 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 1 mm obstructing stone distal right ureter/right ureteral vesicle junction on CT 09/13/2016.  13. 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 Hypercalcemia 12/19/2016-asymptomatic  15. Nausea following FOLFIRI-emend added beginning with cycle 3, trial of Reglan beginning12/08/2017, persistent  16.Erythematous, mildly indurated rash right upper posterior leg in setting of possible recent tick bitestatus post course of doxycycline.Resolved.  17.Sore throat/left buccal ulcer following cycle 1 Taxol/ramucirumab  18.Right eye vision loss- etiology unclear, potentially related to diabetes-evaluated by ophthalmology  19.Left tongue ulcer during cycle 7 Taxol/ramucirumab   Disposition: [name].  Recent restaging CTs indicate stable disease.  The plan is to continue every 2-week ramucirumab.  We will see him in follow-up in 4 weeks.  He will contact the office in the interim with any problems.  Patient seen with Dr. Benay Spice.   Ned Card ANP/GNP-BC   02/04/2019  2:46 PM This was a shared visit with Ned Card.  [name] appears unchanged.  The overall impression from the restaging neck and chest CTs is stable disease.  The plan is to continue ramucirumab.  Julieanne Manson, MD

## 2019-02-04 NOTE — Patient Instructions (Signed)
[facility] Discharge Instructions for Patients Receiving Chemotherapy  Today you received the following chemotherapy agents Cyramza   To help prevent nausea and vomiting after your treatment, we encourage you to take your nausea medication as directed.    If you develop nausea and vomiting that is not controlled by your nausea medication, call the clinic.   BELOW ARE SYMPTOMS THAT SHOULD BE REPORTED IMMEDIATELY:  *FEVER GREATER THAN 100.5 F  *CHILLS WITH OR WITHOUT FEVER  NAUSEA AND VOMITING THAT IS NOT CONTROLLED WITH YOUR NAUSEA MEDICATION  *UNUSUAL SHORTNESS OF BREATH  *UNUSUAL BRUISING OR BLEEDING  TENDERNESS IN MOUTH AND THROAT WITH OR WITHOUT PRESENCE OF ULCERS  *URINARY PROBLEMS  *BOWEL PROBLEMS  UNUSUAL RASH Items with * indicate a potential emergency and should be followed up as soon as possible.  Feel free to call the clinic should you have any questions or concerns. The clinic phone number is [phone].  Please show the [facility] at check-in to the Emergency Department and triage nurse.

## 2019-02-05 ENCOUNTER — Telehealth: Payer: Self-pay | Admitting: Nurse Practitioner

## 2019-02-05 NOTE — Telephone Encounter (Signed)
Scheduled appt per 5/13 los. ° °A calendar will be mailed out. °

## 2019-02-10 ENCOUNTER — Telehealth: Payer: Self-pay | Admitting: *Deleted

## 2019-02-10 NOTE — Telephone Encounter (Signed)
Male caller left VM requesting return call. Did not leave name or reason for call. Called back and left VM to call back if necessary. Did leave message with time/date of next appointment in case that was the question.

## 2019-02-11 ENCOUNTER — Telehealth: Payer: Self-pay | Admitting: *Deleted

## 2019-02-19 ENCOUNTER — Inpatient Hospital Stay: Payer: Medicaid Other

## 2019-02-19 ENCOUNTER — Other Ambulatory Visit: Payer: Self-pay

## 2019-02-19 VITALS — BP 150/90 | HR 87 | Temp 99.1°F | Resp 16

## 2019-02-19 DIAGNOSIS — C160 Malignant neoplasm of cardia: Secondary | ICD-10-CM

## 2019-02-19 DIAGNOSIS — Z5112 Encounter for antineoplastic immunotherapy: Secondary | ICD-10-CM | POA: Diagnosis not present

## 2019-02-19 DIAGNOSIS — Z95828 Presence of other vascular implants and grafts: Secondary | ICD-10-CM

## 2019-02-19 LAB — CBC WITH DIFFERENTIAL (CANCER CENTER ONLY)
Abs Immature Granulocytes: 0.02 10*3/uL (ref 0.00–0.07)
Basophils Absolute: 0 10*3/uL (ref 0.0–0.1)
Basophils Relative: 1 %
Eosinophils Absolute: 0.4 10*3/uL (ref 0.0–0.5)
Eosinophils Relative: 5 %
HCT: 43.9 % (ref 39.0–52.0)
Hemoglobin: 14 g/dL (ref 13.0–17.0)
Immature Granulocytes: 0 %
Lymphocytes Relative: 29 %
Lymphs Abs: 2.4 10*3/uL (ref 0.7–4.0)
MCH: 27.2 pg (ref 26.0–34.0)
MCHC: 31.9 g/dL (ref 30.0–36.0)
MCV: 85.4 fL (ref 80.0–100.0)
Monocytes Absolute: 0.6 10*3/uL (ref 0.1–1.0)
Monocytes Relative: 8 %
Neutro Abs: 4.8 10*3/uL (ref 1.7–7.7)
Neutrophils Relative %: 57 %
Platelet Count: 197 10*3/uL (ref 150–400)
RBC: 5.14 MIL/uL (ref 4.22–5.81)
RDW: 15.7 % — ABNORMAL HIGH (ref 11.5–15.5)
WBC Count: 8.4 10*3/uL (ref 4.0–10.5)
nRBC: 0 % (ref 0.0–0.2)

## 2019-02-19 LAB — CMP (CANCER CENTER ONLY)
ALT: 21 U/L (ref 0–44)
AST: 22 U/L (ref 15–41)
Albumin: 4.1 g/dL (ref 3.5–5.0)
Alkaline Phosphatase: 67 U/L (ref 38–126)
Anion gap: 9 (ref 5–15)
BUN: 16 mg/dL (ref 6–20)
CO2: 27 mmol/L (ref 22–32)
Calcium: 9.9 mg/dL (ref 8.9–10.3)
Chloride: 106 mmol/L (ref 98–111)
Creatinine: 0.95 mg/dL (ref 0.61–1.24)
GFR, Est AFR Am: >60 mL/min (ref >60)
GFR, Estimated: >60 mL/min (ref >60)
Glucose, Bld: 111 mg/dL — ABNORMAL HIGH (ref 70–99)
Potassium: 3.9 mmol/L (ref 3.5–5.1)
Sodium: 142 mmol/L (ref 135–145)
Total Bilirubin: 0.5 mg/dL (ref 0.3–1.2)
Total Protein: 7.7 g/dL (ref 6.5–8.1)

## 2019-02-19 LAB — TOTAL PROTEIN, URINE DIPSTICK: Protein, ur: NEGATIVE mg/dL

## 2019-02-19 MED ORDER — DIPHENHYDRAMINE HCL 50 MG/ML IJ SOLN
25.0000 mg | Freq: Once | INTRAMUSCULAR | Status: AC
  Administered 2019-02-19: 13:00:00 25 mg via INTRAVENOUS

## 2019-02-19 MED ORDER — SODIUM CHLORIDE 0.9 % IV SOLN
Freq: Once | INTRAVENOUS | Status: AC
  Administered 2019-02-19: 13:00:00 via INTRAVENOUS
  Filled 2019-02-19: qty 250

## 2019-02-19 MED ORDER — SODIUM CHLORIDE 0.9% FLUSH
10.0000 mL | INTRAVENOUS | Status: DC | PRN
  Administered 2019-02-19: 15:00:00 10 mL
  Filled 2019-02-19: qty 10

## 2019-02-19 MED ORDER — SODIUM CHLORIDE 0.9% FLUSH
10.0000 mL | INTRAVENOUS | Status: DC | PRN
  Administered 2019-02-19: 10 mL via INTRAVENOUS
  Filled 2019-02-19: qty 10

## 2019-02-19 MED ORDER — ACETAMINOPHEN 325 MG PO TABS
650.0000 mg | ORAL_TABLET | Freq: Once | ORAL | Status: AC
  Administered 2019-02-19: 13:00:00 650 mg via ORAL

## 2019-02-19 MED ORDER — SODIUM CHLORIDE 0.9 % IV SOLN
8.0000 mg/kg | Freq: Once | INTRAVENOUS | Status: AC
  Administered 2019-02-19: 14:00:00 600 mg via INTRAVENOUS
  Filled 2019-02-19: qty 50

## 2019-02-19 MED ORDER — DIPHENHYDRAMINE HCL 50 MG/ML IJ SOLN
INTRAMUSCULAR | Status: AC
  Filled 2019-02-19: qty 1

## 2019-02-19 MED ORDER — ACETAMINOPHEN 325 MG PO TABS
ORAL_TABLET | ORAL | Status: AC
  Filled 2019-02-19: qty 2

## 2019-02-19 MED ORDER — HEPARIN SOD (PORK) LOCK FLUSH 100 UNIT/ML IV SOLN
500.0000 [IU] | Freq: Once | INTRAVENOUS | Status: AC | PRN
  Administered 2019-02-19: 15:00:00 500 [IU]
  Filled 2019-02-19: qty 5

## 2019-02-19 NOTE — Patient Instructions (Addendum)
[facility] Discharge Instructions for Patients Receiving Chemotherapy  Today you received the following chemotherapy agents:  Cyramza (ramucirumab)  To help prevent nausea and vomiting after your treatment, we encourage you to take your nausea medication as prescribed.   If you develop nausea and vomiting that is not controlled by your nausea medication, call the clinic.   BELOW ARE SYMPTOMS THAT SHOULD BE REPORTED IMMEDIATELY:  *FEVER GREATER THAN 100.5 F  *CHILLS WITH OR WITHOUT FEVER  NAUSEA AND VOMITING THAT IS NOT CONTROLLED WITH YOUR NAUSEA MEDICATION  *UNUSUAL SHORTNESS OF BREATH  *UNUSUAL BRUISING OR BLEEDING  TENDERNESS IN MOUTH AND THROAT WITH OR WITHOUT PRESENCE OF ULCERS  *URINARY PROBLEMS  *BOWEL PROBLEMS  UNUSUAL RASH Items with * indicate a potential emergency and should be followed up as soon as possible.  Feel free to call the clinic should you have any questions or concerns. The clinic phone number is [phone].  Please show the [facility] at check-in to the Emergency Department and triage nurse.  Coronavirus (COVID-19) Are you at risk?  Are you at risk for the Coronavirus (COVID-19)?  To be considered HIGH RISK for Coronavirus (COVID-19), you have to meet the following criteria:  . Traveled to [place], [place], [place], [place] or [place]; or in the [place] to [place], [place], [place], or [place]; and have fever, cough, and shortness of breath within the last 2 weeks of travel OR . Been in close contact with a person diagnosed with COVID-19 within the last 2 weeks and have fever, cough, and shortness of breath . IF YOU DO NOT MEET THESE CRITERIA, YOU ARE CONSIDERED LOW RISK FOR COVID-19.  What to do if you are HIGH RISK for COVID-19?  Marland Kitchen If you are having a medical emergency, call 911. . Seek medical care right away. Before you go to a doctor's office, urgent care or emergency department, call ahead and tell them  about your recent travel, contact with someone diagnosed with COVID-19, and your symptoms. You should receive instructions from your physician's office regarding next steps of care.  . When you arrive at healthcare provider, tell the healthcare staff immediately you have returned from visiting [place], [place], [place], [place] or [place]; or traveled in the [place] to [place], [place], [place], or [place]; in the last two weeks or you have been in close contact with a person diagnosed with COVID-19 in the last 2 weeks.   . Tell the health care staff about your symptoms: fever, cough and shortness of breath. . After you have been seen by a medical provider, you will be either: o Tested for (COVID-19) and discharged home on quarantine except to seek medical care if symptoms worsen, and asked to  - Stay home and avoid contact with others until you get your results (4-5 days)  - Avoid travel on public transportation if possible (such as bus, train, or airplane) or o Sent to the Emergency Department by EMS for evaluation, COVID-19 testing, and possible admission depending on your condition and test results.  What to do if you are LOW RISK for COVID-19?  Reduce your risk of any infection by using the same precautions used for avoiding the common cold or flu:  Marland Kitchen Wash your hands often with soap and warm water for at least 20 seconds.  If soap and water are not readily available, use an alcohol-based hand sanitizer with at least 60% alcohol.  . If coughing  or sneezing, cover your mouth and nose by coughing or sneezing into the elbow areas of your shirt or coat, into a tissue or into your sleeve (not your hands). . Avoid shaking hands with others and consider head nods or verbal greetings only. . Avoid touching your eyes, nose, or mouth with unwashed hands.  . Avoid close contact with people who are sick. . Avoid places or events with large numbers of people in one location, like concerts or  sporting events. . Carefully consider travel plans you have or are making. . If you are planning any travel outside or inside the [place], visit the CDC's Travelers' Health webpage for the latest health notices. . If you have some symptoms but not all symptoms, continue to monitor at home and seek medical attention if your symptoms worsen. . If you are having a medical emergency, call 911.   ADDITIONAL HEALTHCARE OPTIONS FOR PATIENTS  [facility] / e-Visit: [email or web address]         [facility]: [phone]  [facility]: [phone]                   [facility]: [phone]   

## 2019-02-24 ENCOUNTER — Other Ambulatory Visit: Payer: Self-pay | Admitting: Oncology

## 2019-03-01 ENCOUNTER — Other Ambulatory Visit: Payer: Self-pay | Admitting: Oncology

## 2019-03-04 ENCOUNTER — Inpatient Hospital Stay: Payer: Medicaid Other

## 2019-03-04 ENCOUNTER — Other Ambulatory Visit: Payer: Self-pay | Admitting: Nurse Practitioner

## 2019-03-04 ENCOUNTER — Other Ambulatory Visit: Payer: Medicaid Other

## 2019-03-04 ENCOUNTER — Ambulatory Visit: Payer: Medicaid Other | Admitting: Oncology

## 2019-03-04 DIAGNOSIS — C160 Malignant neoplasm of cardia: Secondary | ICD-10-CM

## 2019-03-05 ENCOUNTER — Encounter: Payer: Self-pay | Admitting: Oncology

## 2019-03-05 ENCOUNTER — Encounter: Payer: Self-pay | Admitting: Nurse Practitioner

## 2019-03-06 ENCOUNTER — Telehealth: Payer: Self-pay | Admitting: *Deleted

## 2019-03-06 NOTE — Telephone Encounter (Signed)
Referral/medical records faxed to [facility] - Release [id number]

## 2019-03-06 NOTE — Telephone Encounter (Signed)
Calling to request to speak with Ned Card, NP today regarding referral. They have not heard from the [facility] center yet. He is starting to have neck pain now. Can be reached at [phone] and best after 10 am today.

## 2019-03-10 ENCOUNTER — Encounter: Payer: Self-pay | Admitting: *Deleted

## 2019-03-10 ENCOUNTER — Telehealth: Payer: Self-pay | Admitting: Oncology

## 2019-03-10 ENCOUNTER — Other Ambulatory Visit: Payer: Self-pay | Admitting: Nurse Practitioner

## 2019-03-10 DIAGNOSIS — C160 Malignant neoplasm of cardia: Secondary | ICD-10-CM

## 2019-03-10 NOTE — Progress Notes (Signed)
Spoke with Joycelyn Schmid, new patient coordinator at [facility]. Confirmed they accept his insurance. She will fax a referral form to be completed and returned with demographics and medical records. Completed intake referral form and forwarded to HIM to send records today.

## 2019-03-10 NOTE — Telephone Encounter (Signed)
Call [facility] on 03/06/19 I was informed that the Medical oncology facility would contact the patient to schedule an appt. I was told that this request would be made urgent.

## 2019-03-11 ENCOUNTER — Telehealth: Payer: Self-pay | Admitting: Oncology

## 2019-03-11 NOTE — Telephone Encounter (Signed)
[place] at [phone] and spoke with Lexine Baton, I was informed that she had not received all documents and I have faxed records to [phone]. I made [facility] aware that patient needs to be seen as soon as possible. I also noted this on with the faxed records.

## 2019-03-12 ENCOUNTER — Telehealth: Payer: Self-pay | Admitting: *Deleted

## 2019-03-12 NOTE — Telephone Encounter (Addendum)
Call to [facility] in [place] and spoke with Joycelyn Schmid. She confirmed records have been received and they are now with the new patient coordinator who is working with MD to obtain an appointment for him Stressed again this is urgent and wife is very anxious. He has missed last tx due on 03/04/19. Provided his wife, [name] with phone # [phone] to follow up on appointment. Called back by Joycelyn Schmid at [facility]: being seen tomorrow by Dr. Diana Eves. They will be calling wife soon.

## 2019-04-02 IMAGING — CT CT NECK W/ CM
3 of 4 series · 12 of 33 positions shown, 14 images · IV contrast (omnipaque)
Comparison: Chest CT 01/06/2018

CLINICAL DATA: Right neck mass/swelling. Sore throat. History of
gastric cancer.

EXAM:
CT NECK WITH CONTRAST
TECHNIQUE: Multidetector CT imaging of the neck was performed using the
standard protocol following the bolus administration of intravenous
contrast.
CONTRAST:  75mL OMNIPAQUE IOHEXOL 300 MG/ML  SOLN

[Series 6: orthogonal ax · axial · 0.39mm/px · z∈[-340,-139]mm · 4 of 145 slices shown, 5 images]
[im 21/145  soft-tissue]
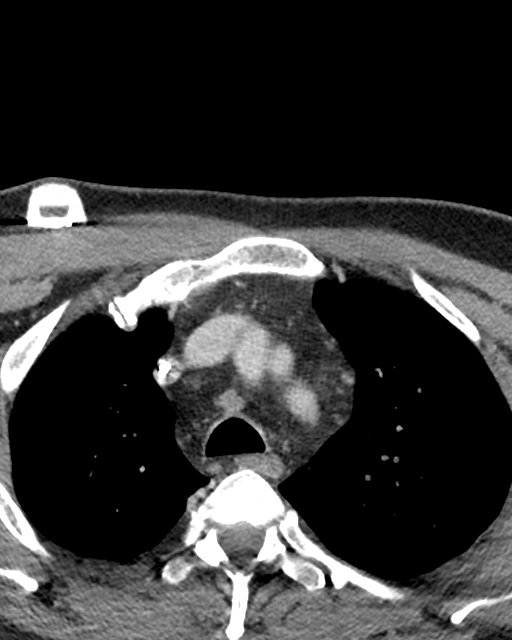
[im 21/145  bone]
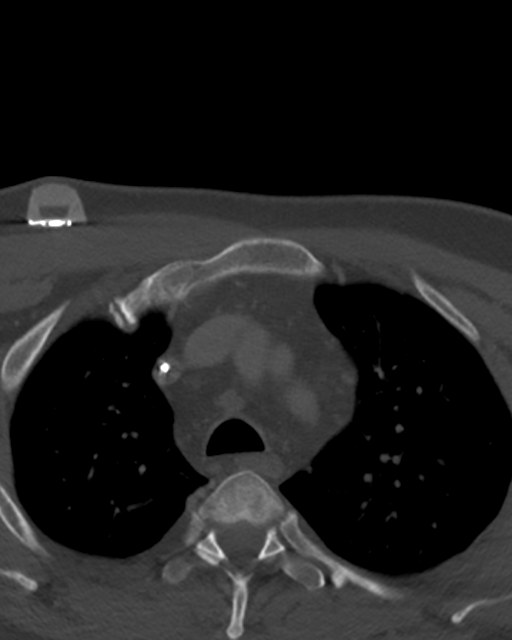
[im 62/145  bone]
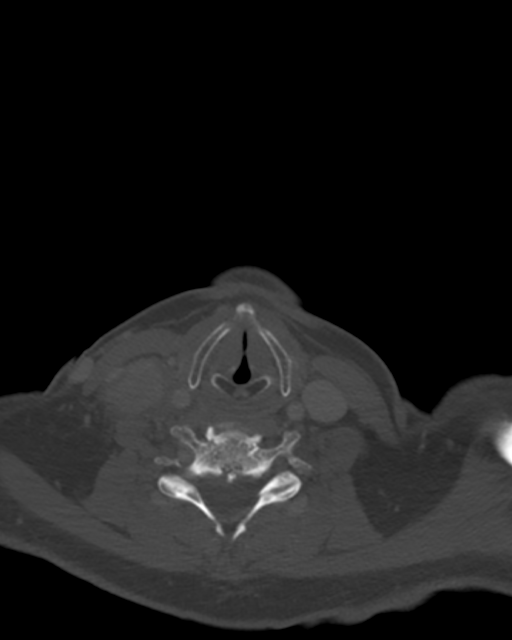
[im 83/145  bone]
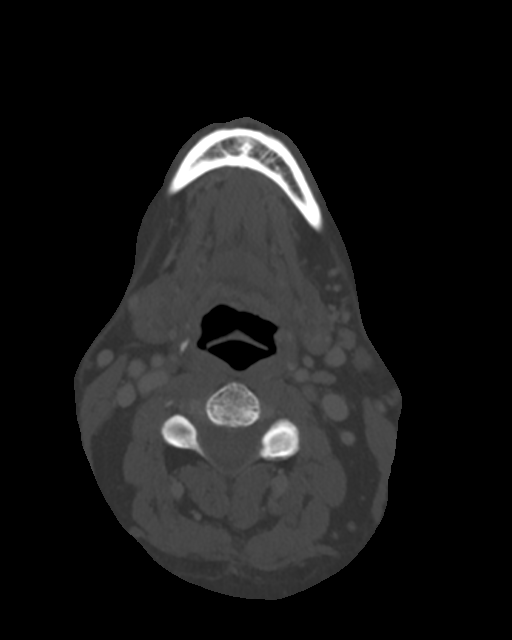
[im 124/145  bone]
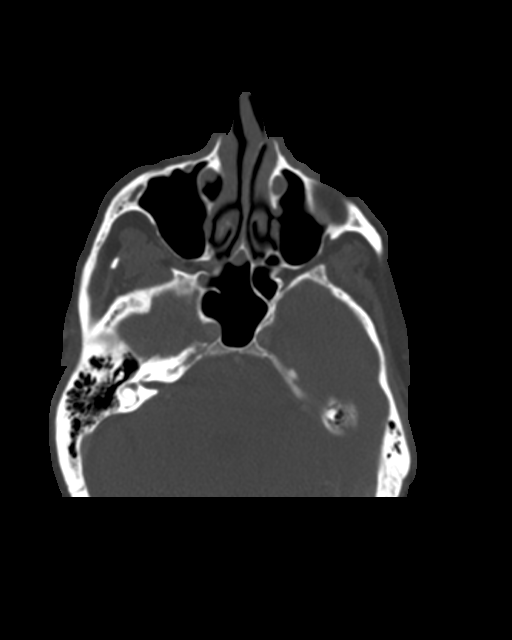

[Series 7: cor neck · coronal · 0.49mm/px · 3 of 110 slices shown]
[im 31/110  bone]
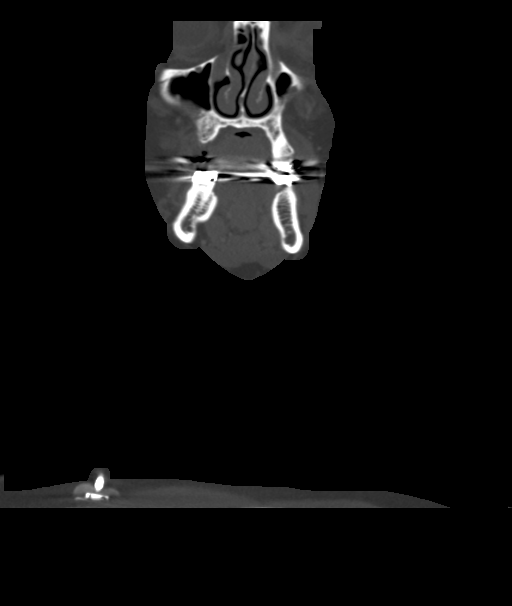
[im 47/110  bone]
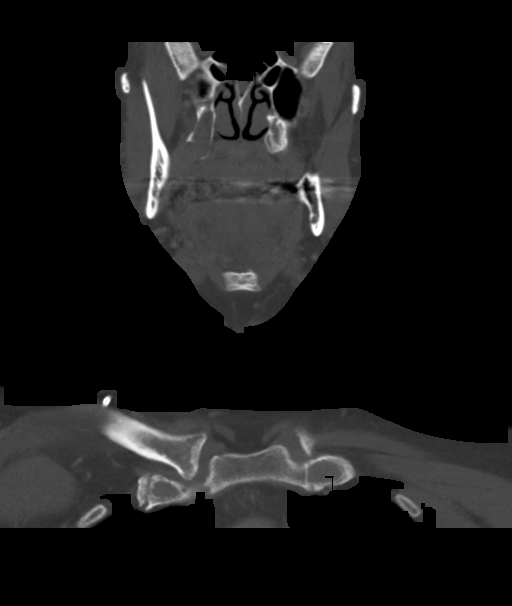
[im 63/110  bone]
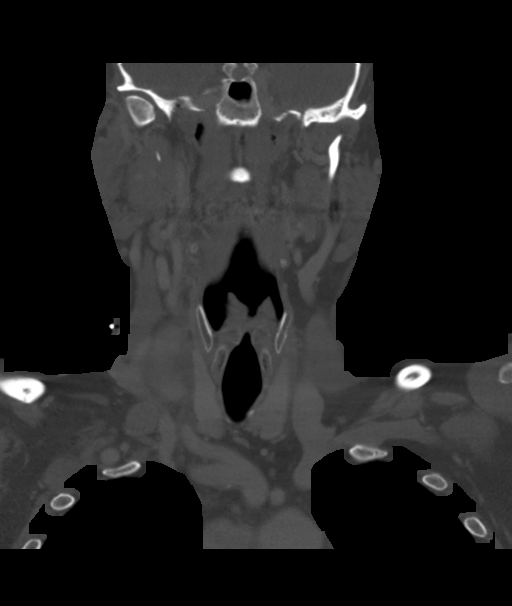

[Series 8: sag neck · sagittal · 0.48mm/px · 5 of 101 slices shown, 6 images]
[im 34/101  bone]
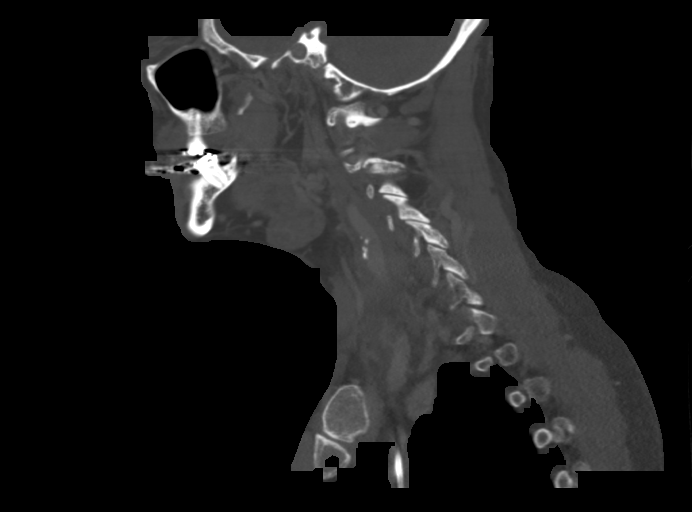
[im 42/101  bone]
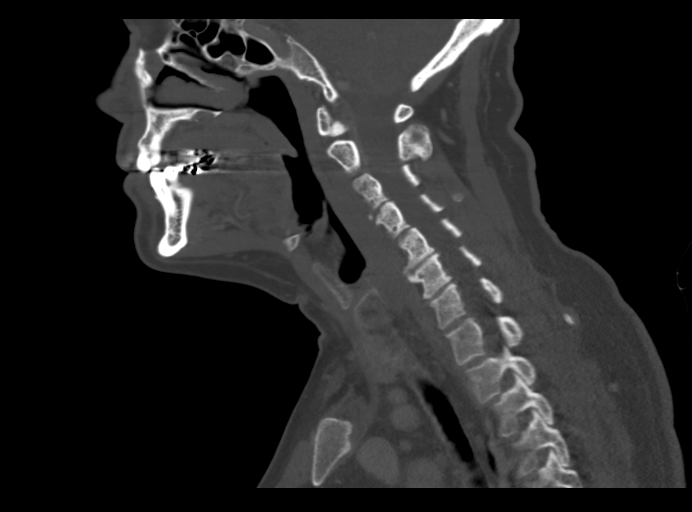
[im 51/101  soft-tissue]
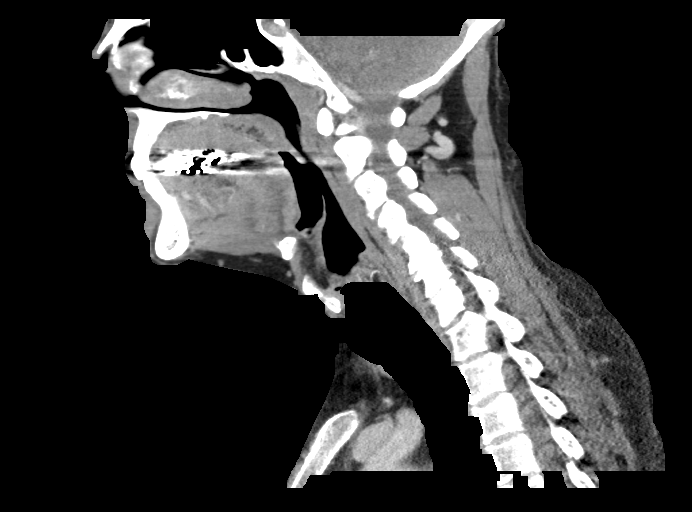
[im 51/101  bone]
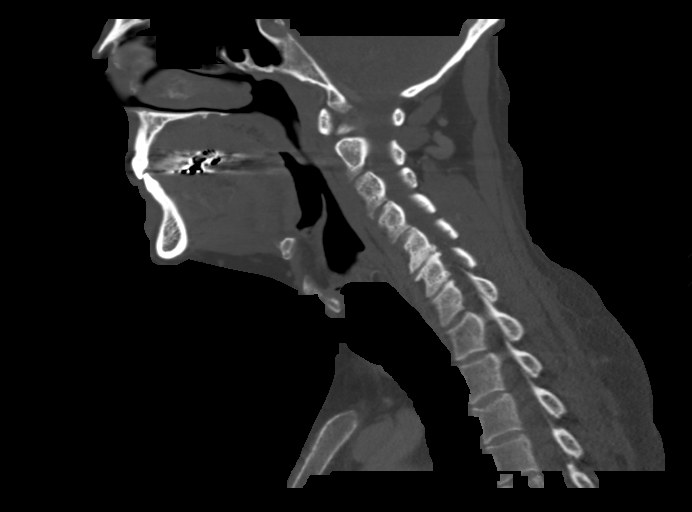
[im 59/101  bone]
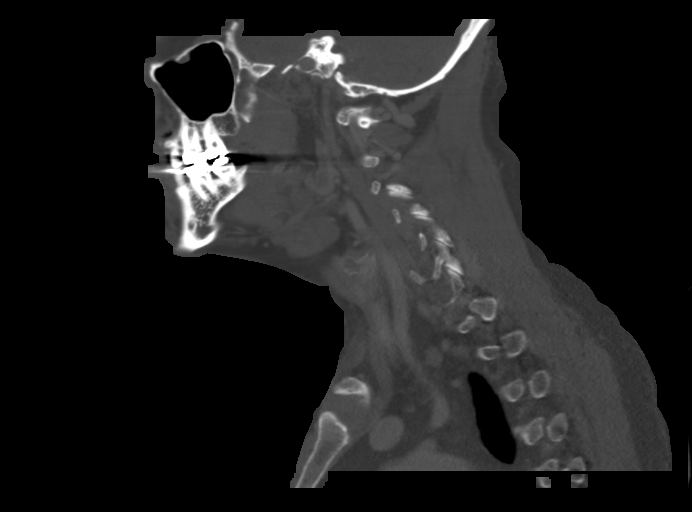
[im 67/101  bone]
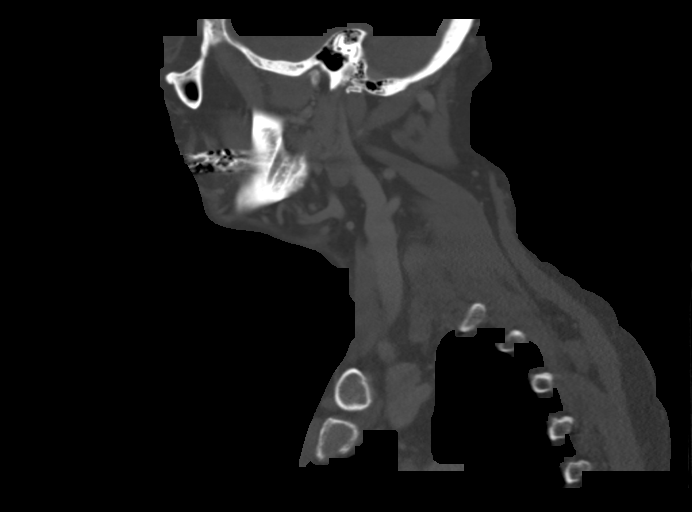

[12 of 33 positions shown; findings below may reference images not displayed]

FINDINGS: Pharynx and larynx: Bilateral palatine tonsillar calcifications. No
pharyngeal mass identified. No significant inflammatory changes or
fluid collection in the parapharyngeal or retropharyngeal spaces.
Unremarkable larynx.

Salivary glands: No inflammation, mass, or stone.

Thyroid: Unremarkable.

Lymph nodes: A partially necrotic lymph nodes spans right levels III
and IV measuring 2.6 x 1.7 x 4.0 cm, incompletely imaged on the
prior chest CT though with the included portion not grossly enlarged
in the interim. This lymph node has however clearly enlarged from an
07/30/2017 chest CT. An adjacent 1.6 x 1.1 cm right level
IV/supraclavicular lymph node is similar in size to the prior CT.
There is abnormal ill-defined low to intermediate density soft
tissue surrounding the larger lymph node which was also partially
visualized on the prior chest CT and which extends superiorly in the
carotid space above the level of the carotid bifurcation. No
additional discrete, enlarged lymph nodes are identified in the
neck.

Vascular: Major vascular structures of the neck are patent. The
right internal jugular vein is moderately compressed in the lower
neck by the necrotic lymph node. A right jugular Port-A-Cath is
partially visualized.

Limited intracranial: Unremarkable.

Visualized orbits: Unremarkable.

Mastoids and visualized paranasal sinuses: Clear.

Skeleton: No suspicious osseous lesion. C7 and T1 superior endplate
Schmorl's nodes. Mild disc degeneration at C5-6 greater than C6-7.

Upper chest: Partially visualized 1.8 cm low right paratracheal and
1.4 cm prevascular lymph nodes, similar to the prior CT. Clear lung
apices.

Other: None.
IMPRESSION: 1. 4 cm necrotic right level III/IV lymph node, similar to what was
partially included on the 01/06/2018 chest CT though considerably
larger than on 07/30/2017. Surrounding ill-defined soft tissue
extending superiorly in the carotid space could reflect
extracapsular tumor.
2. Unchanged adjacent right level IV/supraclavicular lymph node and
partially visualized mediastinal lymph nodes.

## 2019-04-03 ENCOUNTER — Other Ambulatory Visit: Payer: Self-pay | Admitting: Oncology

## 2019-06-29 ENCOUNTER — Telehealth: Payer: Self-pay | Admitting: *Deleted

## 2019-06-29 NOTE — Telephone Encounter (Signed)
Records faxed to [facility] - Release [id number]

## 2019-12-08 IMAGING — CT CT CHEST W/ CM
3 of 4 series · 17 of 36 positions shown, 19 images · IV contrast (omnipaque)
Comparison: CT 05/23/2018

CLINICAL DATA: Gastric carcinoma diagnosed 0340. Chemotherapy in
progress.

EXAM:
CT CHEST WITH CONTRAST
TECHNIQUE: Multidetector CT imaging of the chest was performed during
intravenous contrast administration.
CONTRAST:  75mL OMNIPAQUE IOHEXOL 300 MG/ML  SOLN

[Series 4: axial st · axial · 0.83mm/px · z∈[-455,-205]mm · 7 of 68 slices shown, 9 images]
[im 9/68  mediastinal]
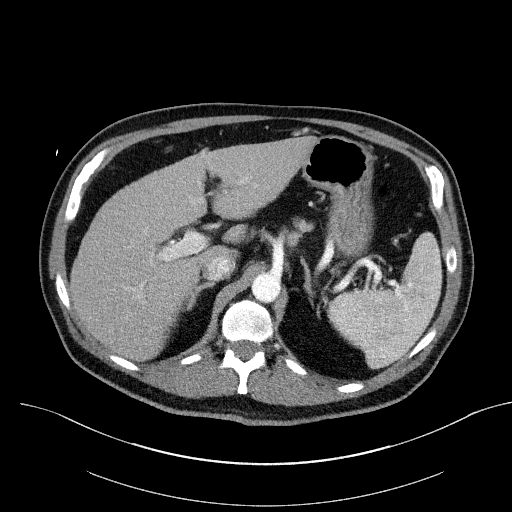
[im 9/68  lung]
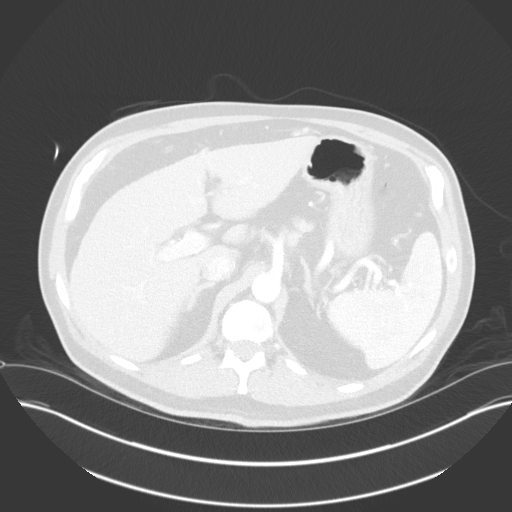
[im 17/68  lung]
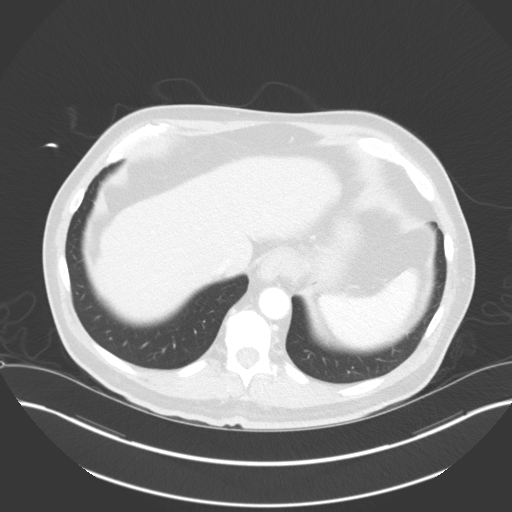
[im 26/68  lung]
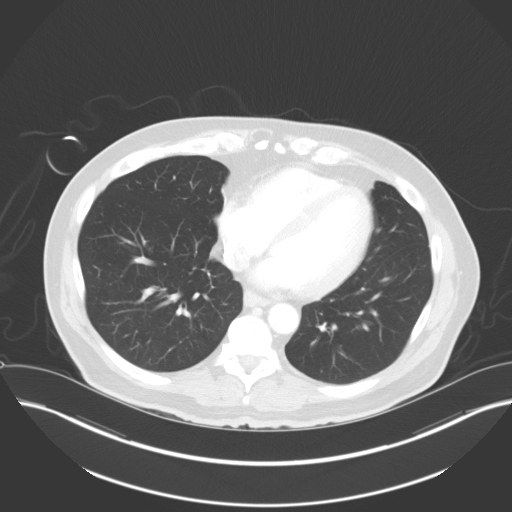
[im 34/68  lung]
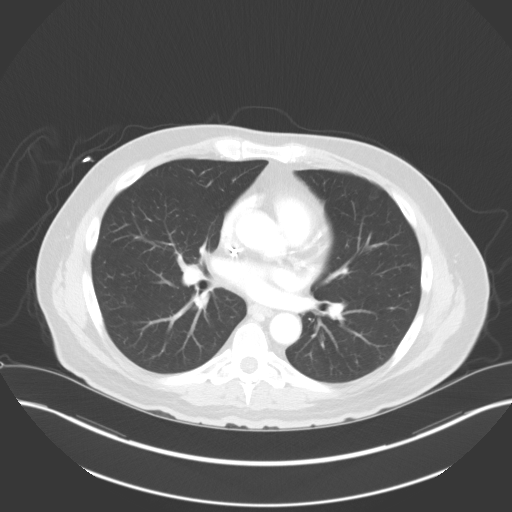
[im 42/68  mediastinal]
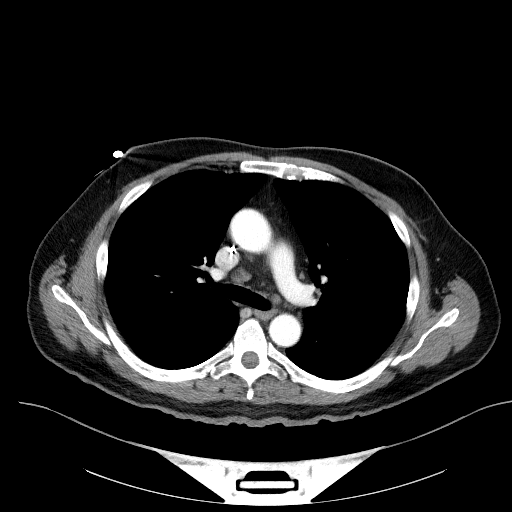
[im 42/68  lung]
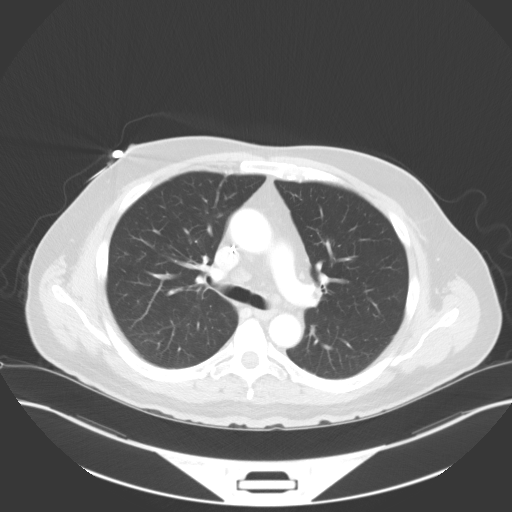
[im 51/68  lung]
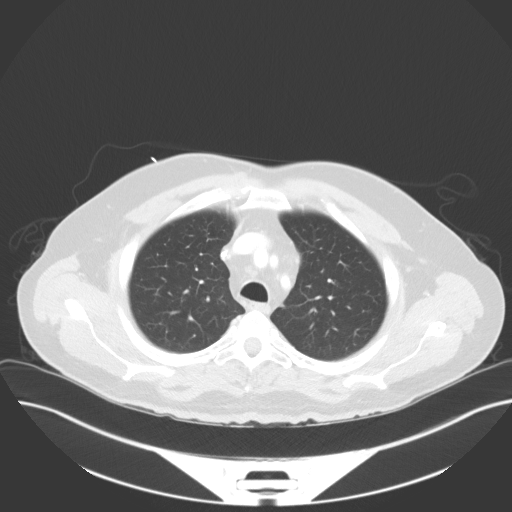
[im 59/68  lung]
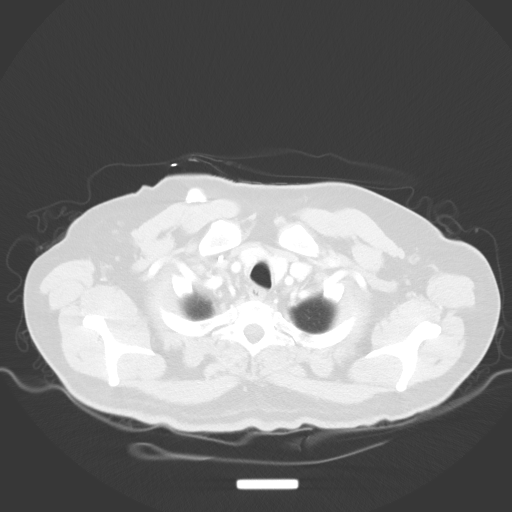

[Series 5: coronal · coronal · 0.71mm/px · 3 of 140 slices shown]
[im 28/140  lung]
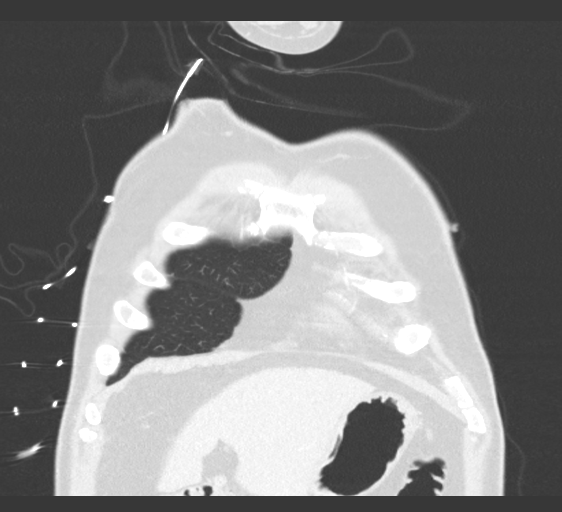
[im 56/140  lung]
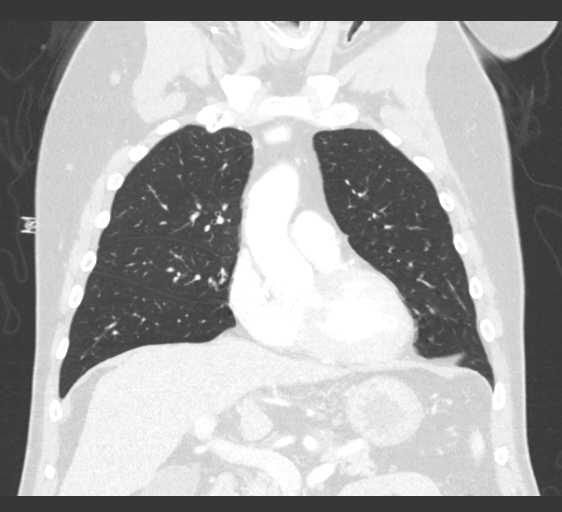
[im 84/140  lung]
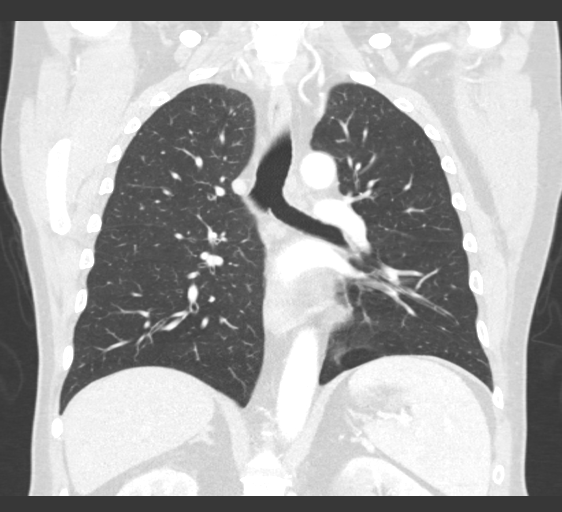

[Series 7: lungs · axial · 0.83mm/px · z∈[-422,-226]mm · 7 of 148 slices shown]
[im 17/148  lung]
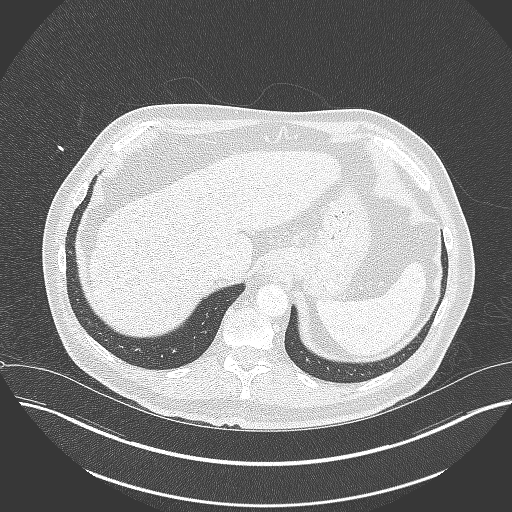
[im 33/148  lung]
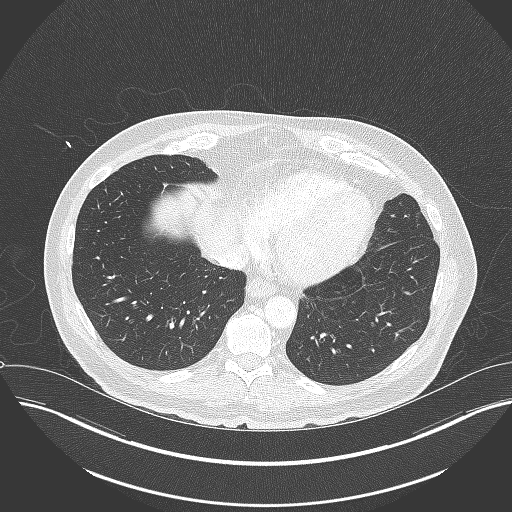
[im 50/148  lung]
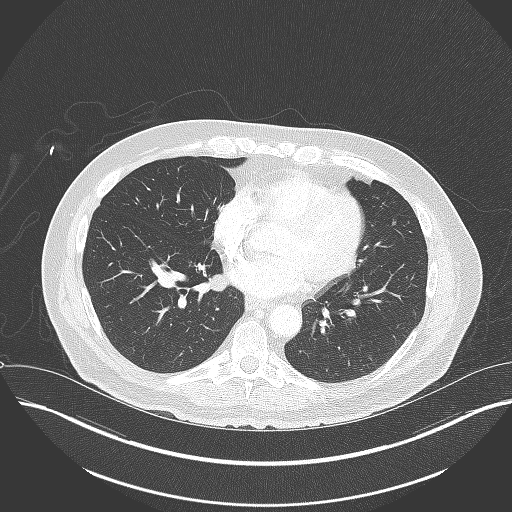
[im 66/148  lung]
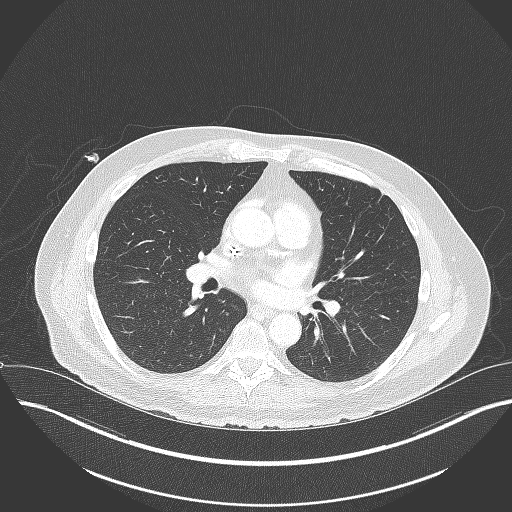
[im 82/148  lung]
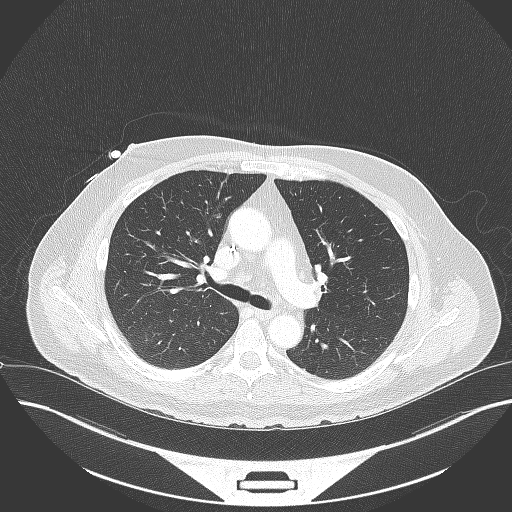
[im 99/148  lung]
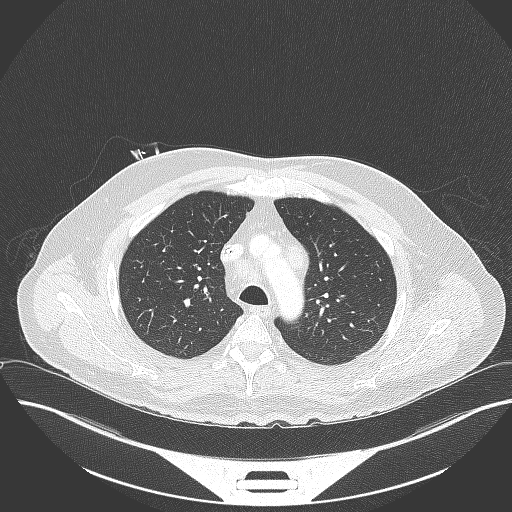
[im 115/148  lung]
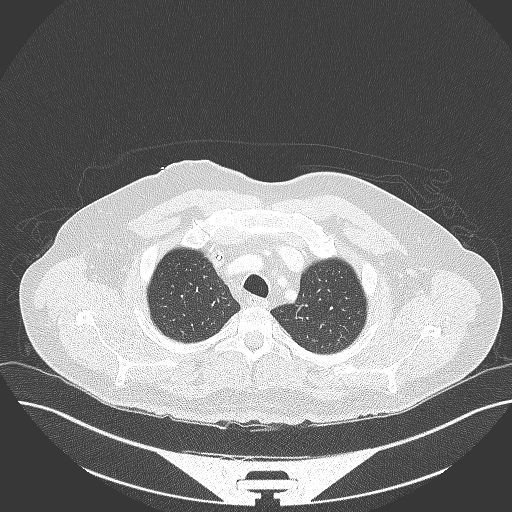

[17 of 36 positions shown; findings below may reference images not displayed]

FINDINGS: Cardiovascular: Port in the anterior chest wall with tip in distal
SVC. Coronary artery calcification and aortic atherosclerotic
calcification.

Mediastinum/Nodes: No axillary adenopathy. RIGHT supraclavicular
lymph node measuring 16 mm (image number [DATE]) is not imaged on
prior. More inferior supraclavicular node measuring 10 mm is
unchanged.

RIGHT lower paratracheal lymph node measuring 16 mm (image [DATE]) is
unchanged. Prevascular lymph node measuring 13 mm is unchanged. No
hilar adenopathy.

Lungs/Pleura: No suspicious pulmonary nodules.

Upper Abdomen: Limited view of the liver, kidneys, pancreas are
unremarkable. Normal adrenal glands.

Musculoskeletal: No aggressive osseous lesion. Chronic compression
deformity in the midthoracic spine.
IMPRESSION: 1. Stable mediastinal adenopathy.
2. Large RIGHT supraclavicular node not imaged on comparison exam.
3. No pulmonary metastasis.

## 2022-04-16 ENCOUNTER — Other Ambulatory Visit: Payer: Self-pay
